# Patient Record
Sex: Female | Born: 1950 | Race: White | Hispanic: No | Smoking: Never smoker
Health system: Southern US, Community
[De-identification: ages and names within clinical notes are randomized; demographics above are authoritative.]

## PROBLEM LIST (undated history)

## (undated) DIAGNOSIS — T7840XA Allergy, unspecified, initial encounter: Secondary | ICD-10-CM

## (undated) DIAGNOSIS — M8580 Other specified disorders of bone density and structure, unspecified site: Secondary | ICD-10-CM

## (undated) DIAGNOSIS — K219 Gastro-esophageal reflux disease without esophagitis: Secondary | ICD-10-CM

## (undated) DIAGNOSIS — C801 Malignant (primary) neoplasm, unspecified: Secondary | ICD-10-CM

## (undated) HISTORY — PX: FRACTURE SURGERY: SHX138

## (undated) HISTORY — DX: Allergy, unspecified, initial encounter: T78.40XA

## (undated) HISTORY — DX: Malignant (primary) neoplasm, unspecified: C80.1

## (undated) HISTORY — PX: ABDOMINAL HYSTERECTOMY: SHX81

## (undated) HISTORY — DX: Other specified disorders of bone density and structure, unspecified site: M85.80

---

## 1999-05-31 ENCOUNTER — Emergency Department (HOSPITAL_COMMUNITY): Admission: EM | Admit: 1999-05-31 | Discharge: 1999-05-31 | Payer: Self-pay | Admitting: Emergency Medicine

## 2000-09-12 ENCOUNTER — Other Ambulatory Visit: Admission: RE | Admit: 2000-09-12 | Discharge: 2000-09-12 | Payer: Self-pay

## 2001-01-16 ENCOUNTER — Other Ambulatory Visit: Admission: RE | Admit: 2001-01-16 | Discharge: 2001-01-16 | Payer: Self-pay

## 2001-03-13 ENCOUNTER — Other Ambulatory Visit: Admission: RE | Admit: 2001-03-13 | Discharge: 2001-03-13 | Payer: Self-pay

## 2001-03-14 ENCOUNTER — Ambulatory Visit (HOSPITAL_COMMUNITY): Admission: RE | Admit: 2001-03-14 | Discharge: 2001-03-14 | Payer: Self-pay | Admitting: Obstetrics and Gynecology

## 2001-03-14 ENCOUNTER — Encounter: Payer: Self-pay | Admitting: Obstetrics and Gynecology

## 2001-03-28 ENCOUNTER — Ambulatory Visit (HOSPITAL_COMMUNITY): Admission: RE | Admit: 2001-03-28 | Discharge: 2001-03-28 | Payer: Self-pay | Admitting: Family Medicine

## 2001-03-28 ENCOUNTER — Encounter: Payer: Self-pay | Admitting: Obstetrics and Gynecology

## 2001-04-02 ENCOUNTER — Ambulatory Visit (HOSPITAL_COMMUNITY): Admission: RE | Admit: 2001-04-02 | Discharge: 2001-04-02 | Payer: Self-pay | Admitting: Obstetrics and Gynecology

## 2001-04-02 ENCOUNTER — Encounter: Payer: Self-pay | Admitting: Obstetrics and Gynecology

## 2002-04-15 ENCOUNTER — Ambulatory Visit (HOSPITAL_COMMUNITY): Admission: RE | Admit: 2002-04-15 | Discharge: 2002-04-15 | Payer: Self-pay | Admitting: Family Medicine

## 2002-04-15 ENCOUNTER — Encounter: Payer: Self-pay | Admitting: Family Medicine

## 2003-03-17 ENCOUNTER — Emergency Department (HOSPITAL_COMMUNITY): Admission: EM | Admit: 2003-03-17 | Discharge: 2003-03-17 | Payer: Self-pay | Admitting: Emergency Medicine

## 2003-04-22 ENCOUNTER — Encounter (HOSPITAL_COMMUNITY): Admission: RE | Admit: 2003-04-22 | Discharge: 2003-05-22 | Payer: Self-pay | Admitting: Orthopaedic Surgery

## 2003-05-26 ENCOUNTER — Emergency Department (HOSPITAL_COMMUNITY): Admission: EM | Admit: 2003-05-26 | Discharge: 2003-05-26 | Payer: Self-pay | Admitting: Emergency Medicine

## 2003-05-27 ENCOUNTER — Emergency Department (HOSPITAL_COMMUNITY): Admission: EM | Admit: 2003-05-27 | Discharge: 2003-05-27 | Payer: Self-pay | Admitting: Emergency Medicine

## 2003-06-07 HISTORY — PX: CHOLECYSTECTOMY: SHX55

## 2003-10-21 ENCOUNTER — Emergency Department (HOSPITAL_COMMUNITY): Admission: EM | Admit: 2003-10-21 | Discharge: 2003-10-21 | Payer: Self-pay | Admitting: Emergency Medicine

## 2004-04-18 ENCOUNTER — Emergency Department (HOSPITAL_COMMUNITY): Admission: EM | Admit: 2004-04-18 | Discharge: 2004-04-18 | Payer: Self-pay | Admitting: Emergency Medicine

## 2005-08-25 ENCOUNTER — Other Ambulatory Visit: Admission: RE | Admit: 2005-08-25 | Discharge: 2005-08-25 | Payer: Self-pay | Admitting: Emergency Medicine

## 2005-08-30 ENCOUNTER — Ambulatory Visit (HOSPITAL_COMMUNITY): Admission: RE | Admit: 2005-08-30 | Discharge: 2005-08-30 | Payer: Self-pay | Admitting: Emergency Medicine

## 2009-08-31 ENCOUNTER — Ambulatory Visit: Payer: Self-pay | Admitting: Gastroenterology

## 2009-08-31 DIAGNOSIS — K219 Gastro-esophageal reflux disease without esophagitis: Secondary | ICD-10-CM

## 2009-08-31 DIAGNOSIS — R1310 Dysphagia, unspecified: Secondary | ICD-10-CM | POA: Insufficient documentation

## 2009-09-04 ENCOUNTER — Ambulatory Visit (HOSPITAL_COMMUNITY): Admission: RE | Admit: 2009-09-04 | Discharge: 2009-09-04 | Payer: Self-pay | Admitting: Family Medicine

## 2009-09-04 HISTORY — PX: COLONOSCOPY WITH ESOPHAGOGASTRODUODENOSCOPY (EGD): SHX5779

## 2009-09-18 ENCOUNTER — Ambulatory Visit (HOSPITAL_COMMUNITY): Admission: RE | Admit: 2009-09-18 | Discharge: 2009-09-18 | Payer: Self-pay | Admitting: Gastroenterology

## 2009-09-18 ENCOUNTER — Ambulatory Visit: Payer: Self-pay | Admitting: Gastroenterology

## 2009-10-28 ENCOUNTER — Ambulatory Visit: Payer: Self-pay | Admitting: Vascular Surgery

## 2010-03-01 ENCOUNTER — Emergency Department (HOSPITAL_COMMUNITY)
Admission: EM | Admit: 2010-03-01 | Discharge: 2010-03-01 | Payer: Self-pay | Source: Home / Self Care | Admitting: Emergency Medicine

## 2010-03-21 ENCOUNTER — Emergency Department (HOSPITAL_COMMUNITY)
Admission: EM | Admit: 2010-03-21 | Discharge: 2010-03-22 | Payer: Self-pay | Source: Home / Self Care | Admitting: Emergency Medicine

## 2010-05-28 ENCOUNTER — Emergency Department (HOSPITAL_COMMUNITY)
Admission: EM | Admit: 2010-05-28 | Discharge: 2010-05-28 | Payer: Self-pay | Source: Home / Self Care | Admitting: Emergency Medicine

## 2010-07-06 NOTE — Assessment & Plan Note (Signed)
Summary: npp,diff swallowing.gu   Visit Type:  Initial Consult Referring Provider:  McGough Primary Care Provider:  McGough  Chief Complaint:  difficulty swallowing.  History of Present Illness: 60 y/o caucasian female w/ hx dysphagia x 5 yrs.  c/o "food getting hung, won't go down, stuck in upper esophagus."  Had to call EMS x2.  c/o problems w/ solids/meats.  Denies problems w/ liquids.  c/o heartburn & indigestion almost daily x 5 yrs.  Takes zantac prn & prevacid as needed.  Denies odynophagia.  Denies lower abd pain, rectal bleeding or melena.     Current Problems (verified): 1)  Gerd  (ICD-530.81) 2)  Dysphagia Unspecified  (ICD-787.20)  Current Medications (verified): 1)  Zantac 150 Mg Tabs (Ranitidine Hcl) .... As Needed 2)  Ibuprofen .... As Needed 3)  Omeprazole 20 Mg Cpdr (Omeprazole) .... One By Mouth Every Morning 30 Mins Before Breakfast For Acid Reflux  Allergies (verified): 1)  ! Codeine  Past History:  Past Medical History: GERD (ICD-530.81) DYSPHAGIA UNSPECIFIED (ICD-787.20)  Past Surgical History: complete hysterectomy cholecystectomy-cholelithisis  Family History: No known family history of colorectal carcinoma, IBD, liver or chronic GI problems. 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  Alcohol Use - no Daily Caffeine Use Illicit Drug Use - no Patient does not get regular exercise.  Smoking Status:  never Drug Use:  no Does Patient Exercise:  no  Review of Systems General:  Denies fever, chills, sweats, anorexia, fatigue, weakness, malaise, weight loss, and sleep disorder. ENT:  [place]. CV:  Denies chest pains, angina, palpitations, syncope, dyspnea on exertion, orthopnea, PND, peripheral edema, and claudication. Resp:  Denies dyspnea at rest, dyspnea  with exercise, cough, sputum, wheezing, coughing up blood, and pleurisy. GI:  Denies nausea, vomiting, vomiting blood, abdominal pain, jaundice, gas/bloating, diarrhea, constipation, change in bowel habits, bloody BM's, black BMs, and fecal incontinence. GU:  Denies urinary burning, blood in urine, nocturnal urination, urinary frequency, urinary incontinence, painful intercourse, and decreased libido. Derm:  Denies rash, itching, dry skin, hives, moles, warts, and unhealing ulcers. Psych:  Denies depression, anxiety, memory loss, suicidal ideation, hallucinations, paranoia, phobia, and confusion. Heme:  Denies bruising, bleeding, and enlarged lymph nodes.  Vital Signs:  Patient profile:   60 year old female Height:      61 inches Weight:      160 pounds BMI:     30.34 Temp:     97.6 degrees F oral Pulse rate:   72 / minute BP sitting:   138 / 80  (left arm) Cuff size:   regular  Vitals Entered By: Waldon Merl LPN (August 31, 172 94:49 AM)  Physical Exam  General:  Well developed, well nourished, no acute distress. Head:  Normocephalic and atraumatic. Eyes:  Sclera clear, no icterus. Ears:  Normal auditory acuity. Nose:  No deformity, discharge,  or lesions. Mouth:  No deformity or lesions, dentition normal. Neck:  Supple; no masses or thyromegaly. Lungs:  Clear throughout to auscultation. Heart:  Regular rate and rhythm; no murmurs, rubs,  or bruits. Abdomen:  Soft, nontender and nondistended. No masses, hepatosplenomegaly or hernias noted. Normal bowel sounds.without guarding and without rebound.   Msk:  Symmetrical with no gross deformities. Normal posture. Pulses:  Normal pulses noted. Extremities:  No clubbing, cyanosis, edema or deformities noted. Neurologic:  Alert and  oriented x4;  grossly normal neurologically. Skin:  Intact without significant lesions or rashes. Cervical Nodes:  No significant cervical adenopathy. Psych:  Alert and cooperative. Normal mood and  affect.  Impression & Recommendations:  Problem # 1:  DYSPHAGIA UNSPECIFIED (ICD-86.20) 60 y/o caucasian female w/ long-standing history of dysphagia w/ solids and chronic poorly controlled GERD, not on consistent PPI.  I suspect esophageal web, ring or stricture.  Diagnostic EGD w/ possible esophageal dilatation & screening colonoscopy to be performed by Dr. Barney Drain in the near future.  I have discussed risks and benefits which include, but are not limited to, bleeding, infection, perforation, or medication reaction.  The patient agrees with this plan and consent will be obtained.  Orders: Consultation Level III (57473)  Problem # 2:  GERD (ICD-530.81) See #1  Patient Instructions: 1)  Discontinue prevacid 2)  Begin omeprazole daily 3)  Can use Zantac as needed for breakthrough symptoms 4)  The medication list was reviewed and reconciled.  All changed / newly prescribed medications were explained.  A complete medication list was provided to the patient / caregiver. Prescriptions: OMEPRAZOLE 20 MG CPDR (OMEPRAZOLE) one by mouth every morning 30 mins before breakfast for acid reflux  #31 x 11   Entered and Authorized by:   Andria Meuse FNP-BC   Signed by:   Andria Meuse FNP-BC on 08/31/2009   Method used:   Electronically to        [facility] (retail)       [address]       [place], [place]  [address]       Ph: [phone]       Fax: [phone]   RxID:   [id number]      Appended Document: npp,diff swallowing.gu Please call pt. She needs a soft mechanical diet with chopped or ground meats only until her endo. Give her a HO.  Appended Document: npp,diff swallowing.gu Pt aware.  Please send HO in mail to pt.   Appended Document: npp,diff swallowing.gu HO in mail to pt  Appended Document: npp,diff swallowing.gu DEC 2010: 155 LBS

## 2010-07-06 NOTE — Letter (Signed)
Summary: Internal Other /Procedure scheduling  Internal Other /Procedure scheduling   Imported By: Waldon Merl LPN 38/45/[id number] 80:32:12  _____________________________________________________________________  External Attachment:    Type:   Image     Comment:   External Document

## 2010-08-16 LAB — RAPID STREP SCREEN (MED CTR MEBANE ONLY): Streptococcus, Group A Screen (Direct): NEGATIVE

## 2010-08-18 LAB — CBC
HCT: 33.3 % — ABNORMAL LOW (ref 36.0–46.0)
Hemoglobin: 11.5 g/dL — ABNORMAL LOW (ref 12.0–15.0)
MCH: 30.9 pg (ref 26.0–34.0)
MCHC: 34.5 g/dL (ref 30.0–36.0)
MCV: 89.5 fL (ref 78.0–100.0)
Platelets: 213 10*3/uL (ref 150–400)
RBC: 3.72 MIL/uL — ABNORMAL LOW (ref 3.87–5.11)
RDW: 12.9 % (ref 11.5–15.5)
WBC: 10.7 10*3/uL — ABNORMAL HIGH (ref 4.0–10.5)

## 2010-08-18 LAB — BASIC METABOLIC PANEL
BUN: 14 mg/dL (ref 6–23)
CO2: 25 mEq/L (ref 19–32)
Calcium: 8.4 mg/dL (ref 8.4–10.5)
Chloride: 102 mEq/L (ref 96–112)
Creatinine, Ser: 0.74 mg/dL (ref 0.4–1.2)
GFR calc Af Amer: >60 mL/min (ref >60)
GFR calc non Af Amer: >60 mL/min (ref >60)
Glucose, Bld: 115 mg/dL — ABNORMAL HIGH (ref 70–99)
Potassium: 3.4 mEq/L — ABNORMAL LOW (ref 3.5–5.1)
Sodium: 132 mEq/L — ABNORMAL LOW (ref 135–145)

## 2010-08-18 LAB — DIFFERENTIAL
Basophils Absolute: 0 10*3/uL (ref 0.0–0.1)
Basophils Relative: 0 % (ref 0–1)
Eosinophils Absolute: 0.1 10*3/uL (ref 0.0–0.7)
Eosinophils Relative: 1 % (ref 0–5)
Lymphocytes Relative: 12 % (ref 12–46)
Lymphs Abs: 1.3 10*3/uL (ref 0.7–4.0)
Monocytes Absolute: 0.5 10*3/uL (ref 0.1–1.0)
Monocytes Relative: 5 % (ref 3–12)
Neutro Abs: 8.7 10*3/uL — ABNORMAL HIGH (ref 1.7–7.7)
Neutrophils Relative %: 81 % — ABNORMAL HIGH (ref 43–77)

## 2010-08-19 LAB — URINALYSIS, ROUTINE W REFLEX MICROSCOPIC
Bilirubin Urine: NEGATIVE
Glucose, UA: NEGATIVE mg/dL
Ketones, ur: NEGATIVE mg/dL
Nitrite: POSITIVE — AB
Protein, ur: NEGATIVE mg/dL
Specific Gravity, Urine: 1.02 (ref 1.005–1.030)
Urobilinogen, UA: 0.2 mg/dL (ref 0.0–1.0)
pH: 6 (ref 5.0–8.0)

## 2010-08-19 LAB — COMPREHENSIVE METABOLIC PANEL
ALT: 15 U/L (ref 0–35)
AST: 17 U/L (ref 0–37)
Albumin: 3.3 g/dL — ABNORMAL LOW (ref 3.5–5.2)
Alkaline Phosphatase: 72 U/L (ref 39–117)
BUN: 16 mg/dL (ref 6–23)
CO2: 27 mEq/L (ref 19–32)
Calcium: 8.8 mg/dL (ref 8.4–10.5)
Chloride: 106 mEq/L (ref 96–112)
Creatinine, Ser: 0.92 mg/dL (ref 0.4–1.2)
GFR calc Af Amer: >60 mL/min (ref >60)
GFR calc non Af Amer: >60 mL/min (ref >60)
Glucose, Bld: 95 mg/dL (ref 70–99)
Potassium: 3.3 mEq/L — ABNORMAL LOW (ref 3.5–5.1)
Sodium: 140 mEq/L (ref 135–145)
Total Bilirubin: 0.5 mg/dL (ref 0.3–1.2)
Total Protein: 6.8 g/dL (ref 6.0–8.3)

## 2010-08-19 LAB — URINE MICROSCOPIC-ADD ON

## 2010-08-19 LAB — URINE CULTURE
Colony Count: >=100000
Culture  Setup Time: 201109270210

## 2010-08-19 LAB — CBC
HCT: 33.7 % — ABNORMAL LOW (ref 36.0–46.0)
Hemoglobin: 11.5 g/dL — ABNORMAL LOW (ref 12.0–15.0)
MCH: 30.7 pg (ref 26.0–34.0)
MCHC: 34.1 g/dL (ref 30.0–36.0)
MCV: 90.2 fL (ref 78.0–100.0)
Platelets: 172 10*3/uL (ref 150–400)
RBC: 3.73 MIL/uL — ABNORMAL LOW (ref 3.87–5.11)
RDW: 12.9 % (ref 11.5–15.5)
WBC: 6.1 10*3/uL (ref 4.0–10.5)

## 2010-08-19 LAB — DIFFERENTIAL
Basophils Absolute: 0 10*3/uL (ref 0.0–0.1)
Basophils Relative: 0 % (ref 0–1)
Eosinophils Absolute: 0.1 10*3/uL (ref 0.0–0.7)
Eosinophils Relative: 1 % (ref 0–5)
Lymphocytes Relative: 22 % (ref 12–46)
Lymphs Abs: 1.4 10*3/uL (ref 0.7–4.0)
Monocytes Absolute: 0.4 10*3/uL (ref 0.1–1.0)
Monocytes Relative: 7 % (ref 3–12)
Neutro Abs: 4.3 10*3/uL (ref 1.7–7.7)
Neutrophils Relative %: 70 % (ref 43–77)

## 2010-08-19 LAB — LIPASE, BLOOD: Lipase: 28 U/L (ref 11–59)

## 2010-08-31 ENCOUNTER — Emergency Department (HOSPITAL_COMMUNITY)
Admission: EM | Admit: 2010-08-31 | Discharge: 2010-08-31 | Disposition: A | Discharge status: Home / Self Care | Payer: PRIVATE HEALTH INSURANCE | Attending: Emergency Medicine | Admitting: Emergency Medicine

## 2010-08-31 DIAGNOSIS — M542 Cervicalgia: Secondary | ICD-10-CM | POA: Insufficient documentation

## 2010-08-31 DIAGNOSIS — N390 Urinary tract infection, site not specified: Secondary | ICD-10-CM | POA: Insufficient documentation

## 2010-08-31 DIAGNOSIS — M62838 Other muscle spasm: Secondary | ICD-10-CM | POA: Insufficient documentation

## 2010-08-31 DIAGNOSIS — R300 Dysuria: Secondary | ICD-10-CM | POA: Insufficient documentation

## 2010-08-31 LAB — URINALYSIS, ROUTINE W REFLEX MICROSCOPIC
Bilirubin Urine: NEGATIVE
Glucose, UA: NEGATIVE mg/dL
Hgb urine dipstick: NEGATIVE
Nitrite: NEGATIVE
Protein, ur: NEGATIVE mg/dL
Specific Gravity, Urine: 1.025 (ref 1.005–1.030)
Urobilinogen, UA: 0.2 mg/dL (ref 0.0–1.0)
pH: 5.5 (ref 5.0–8.0)

## 2010-08-31 LAB — URINE MICROSCOPIC-ADD ON

## 2010-09-02 LAB — URINE CULTURE
Colony Count: >=100000
Culture  Setup Time: 201203271915

## 2010-10-19 NOTE — Consult Note (Signed)
NEW PATIENT CONSULTATION   Rebecca Osborne, Rebecca Osborne  DOB:  [birth year]                                       10/28/2009  XIHWT#:[id number]   The patient presents today for evaluation a marked telangiectasia over  both legs.  She reports that she had discomfort from this and has had  bleeding the telangectasia on her thigh on several occasions.  This was  controlled with pressure and she has never had to have any surgical  treatment of this.  She denies any history of DVT.  She does report  generalized aching in both lower extremities with prolonged standing.  This is equal to the right and left legs.  This is somewhat more  prominent over the area of the telangiectasia.  She does not have any  history of thrombophlebitis or ulceration.   PAST MEDICAL HISTORY:  Significant for prior history of esophageal  reflux.  She does have a TAH/BSO secondary to cervical cancer in the  past.  Her medical history is otherwise negative for diabetes,  hypertension or cardiac disease.   FAMILY HISTORY:  Negative for premature atherosclerotic disease or  venous varicosities.   SOCIAL HISTORY:  She is single.  She has 3 children.  She does not smoke  or drink alcohol.   REVIEW OF SYSTEMS:  CONSTITUTIONAL:  No weight loss or weight gain.  CARDIAC:  Negative.  PULMONARY:  Negative.  GI:  Negative.  GU:  Negative.  VASCULAR:  Positive only for pain in her legs with walking and lying  flat.  NEUROLOGIC:  Negative.  MUSCULOSKELETAL:  Positive for pain in her legs with prolonged standing.  PSYCHIATRIC:  Negative.  ENT:  Positive for eyesight change.  HEMATOLOGIC:  Negative.  SKIN:  Negative.   PHYSICAL EXAMINATION:  Well-developed, well-nourished white female  appearing stated age of 33.  Blood pressure is 125/81, pulse 65,  respirations 16.  In general, she is well-developed, well-nourished, in  no acute distress.  HEENT is normal.  Her abdomen is soft and nontender.  No masses.   Musculoskeletal:  No major deformities or cyanosis.  Neurologic:  No focal weakness or paresthesias.  Skin:  No ulcers or  rashes.  She does have marked telangiectasia over both legs.  These are  raised above the skin in a fan-like fashion over her thighs most  particularly and also over her calves.  She does not have any actual  varicose veins and no changes of chronic venous hypertension.   She underwent noninvasive vascular laboratory studies in our office, and  this revealed no evidence of deep venous thrombosis or deep venous  reflux.  Her saphenous vein is without reflux bilaterally as well.  I  reviewed this with the patient.  I explained that her only treatment  option would be sclerotherapy for these telangiectasia.  I did discuss  with her the treatment for bleeding should she have any more bleeding  from the superficial telangiectasia, which would consist of moderate  pressure and Ace an wrap and that this would generally effect stoppage  of the bleeding.  I did explain the treatment option for sclerotherapy  for cosmetic purposes, and also it may improve her symptoms to some  degree.  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  I explained that this  would require multiple sessions.  I explained that this would not  be  covered by insurance and our approximate estimate for treatment.  She  will consider this and notify us if she wishes to proceed.     Rosetta Posner, M.D.  Electronically Signed   TFE/MEDQ  D:  10/28/2009  T:  10/29/2009  Job:  [id number]   cc:   Leonides Grills, M.D.

## 2010-10-19 NOTE — Procedures (Signed)
LOWER EXTREMITY VENOUS REFLUX EXAM   INDICATION:  Raised spider veins.   EXAM:  Using color-flow imaging and pulse Doppler spectral analysis, the  bilateral common femoral, superficial femoral, popliteal, posterior  tibial, greater and lesser saphenous veins are evaluated.  There is no  evidence suggesting deep venous insufficiency in the bilateral lower  extremities.   The bilateral saphenofemoral junctions are competent. The bilateral  GSV's are competent.   The bilateral proximal short saphenous veins demonstrate competency.   GSV Diameter (used if found to be incompetent only)                                            Right    Left  Proximal Greater Saphenous Vein           cm       cm  Proximal-to-mid-thigh                     cm       cm  Mid thigh                                 cm       cm  Mid-distal thigh                          cm       cm  Distal thigh                              cm       cm  Knee                                      cm       cm    IMPRESSION:  1. The bilateral greater saphenous vein appears with no reflux.  2. The bilateral greater saphenous veins are not aneurysmal.  3. The bilateral greater saphenous veins are not tortuous.  4. The deep venous system is competent.  5. The bilateral lesser saphenous veins are competent.           ___________________________________________  Rosetta Posner, M.D.   CB/MEDQ  D:  10/28/2009  T:  10/28/2009  Job:  [id number]

## 2010-10-22 NOTE — Consult Note (Signed)
   Rebecca Osborne, Rebecca Osborne                           ACCOUNT NO.:  [id number]   MEDICAL RECORD NO.:  [id number]                   PATIENT TYPE:  EMS   LOCATION:  ED                                   FACILITY:  [facility]   PHYSICIAN:  J. Sanjuana Kava, M.D.              DATE OF BIRTH:  18-Feb-1951   DATE OF CONSULTATION:  03/17/2003  DATE OF DISCHARGE:                                   CONSULTATION   REFERRING PHYSICIAN:  Dr. Jessy Oto. Lown[place].   REASON FOR CONSULTATION:  Dr. Ivy Lynn asked [place] to see the patient in the  emergency room.   HISTORY OF PRESENT ILLNESS:  The patient is a 60 year old female who was  putting her antenna back on her phone when it slipped and she stabbed  herself, lacerating the extensor tendon to the left long finger, nondominant  hand.  She has inability to extend the finger.  She has a flap-type  laceration.  Dr. Ivy Lynn had opened and explored the wound before I came.   DESCRIPTION OF PROCEDURE:  I explained to the patient the findings and I  used Betadine prep and drape; Xylocaine 1% was used and the ends of the  tendon were then exposed after extending of the incision both proximally and  distally.  The wounds were then reapproximated using 3-0 nylon in figure-of-  eight manner.  Skin wounds were then reapproximated using simple suture and  vertical mattress suture and then a sterile dressing applied, and aluminum  splint applied to keep the long finger straight.   FOLLOWUP:  I will see the patient in the office on Tuesday.  It is already 3  a.m., Monday morning on the 11th.  Prescription for Darvocet-N 100 given for  pain; the patient is allergic to codeine.  Prescription for Keflex 500 mg  given.   IMPRESSION:  Laceration of extensor tendon, left long finger.   PLAN:  The patient is to come back to the emergency room with any problem or  call me through the office during regular hours, call to the hospital after  office hours.  Patient information booklet  given too.      ___________________________________________                                            Iona Hansen, M.D.   JWK/MEDQ  D:  03/17/2003  T:  03/17/2003  Job:  [id number]

## 2012-05-10 ENCOUNTER — Encounter (HOSPITAL_COMMUNITY): Payer: Self-pay | Admitting: Emergency Medicine

## 2012-05-10 ENCOUNTER — Emergency Department (HOSPITAL_COMMUNITY)
Admission: EM | Admit: 2012-05-10 | Discharge: 2012-05-10 | Disposition: A | Discharge status: Home / Self Care | Payer: No Typology Code available for payment source | Attending: Emergency Medicine | Admitting: Emergency Medicine

## 2012-05-10 DIAGNOSIS — N390 Urinary tract infection, site not specified: Secondary | ICD-10-CM | POA: Insufficient documentation

## 2012-05-10 DIAGNOSIS — R51 Headache: Secondary | ICD-10-CM | POA: Insufficient documentation

## 2012-05-10 LAB — URINE MICROSCOPIC-ADD ON

## 2012-05-10 LAB — URINALYSIS, ROUTINE W REFLEX MICROSCOPIC
Bilirubin Urine: NEGATIVE
Glucose, UA: NEGATIVE mg/dL
Ketones, ur: NEGATIVE mg/dL
Nitrite: NEGATIVE
Protein, ur: NEGATIVE mg/dL
Specific Gravity, Urine: >1.03 — ABNORMAL HIGH (ref 1.005–1.030)
Urobilinogen, UA: 0.2 mg/dL (ref 0.0–1.0)
pH: 5.5 (ref 5.0–8.0)

## 2012-05-10 MED ORDER — CIPROFLOXACIN HCL 500 MG PO TABS: 500.0000 mg | ORAL_TABLET | Freq: Two times a day (BID) | ORAL | Status: DC

## 2012-05-10 NOTE — ED Provider Notes (Signed)
History  This chart was scribed for Mervin Kung, MD by Era Bumpers, ED scribe. This patient was seen in room APA03/APA03 and the patient's care was started at 1615.   CSN: [id number]  Arrival date & time 05/10/12  1615   First MD Initiated Contact with Patient 05/10/12 1740      Chief Complaint  Patient presents with  . Dysuria   Patient is a 61 y.o. female presenting with dysuria. The history is provided by the patient. No language interpreter was used.  Dysuria  This is a new problem. The current episode started more than 1 week ago. The problem occurs every urination. The problem has been gradually worsening. The quality of the pain is described as burning. The pain is moderate. There has been no fever. Pertinent negatives include no chills, no nausea, no vomiting and no hematuria. She has tried nothing for the symptoms.   Rebecca Osborne is a 61 y.o. female who presents to the Emergency Department complaining of constant dysuria over the past month which worsened about 3 days ago associated with strong urine odor. She states a mild HA and abdominal pain 2 days ago which has since resolved. She has not taken any medicines for this problem and has not yet seen anyone for this problem. She denies abdominal pain, nausea, emesis, back/neck pain, CP, SOB, leg/ankle swelling, rash.  She also states dental carries and needing to have a tooth pulled soon.   She has no PCP  History reviewed. No pertinent past medical history.  Past Surgical History  Procedure Date  . Abdominal hysterectomy   . Cholecystectomy     History reviewed. No pertinent family history.  History  Substance Use Topics  . Smoking status: Not on file  . Smokeless tobacco: Not on file  . Alcohol Use:     OB History    Grav Para Term Preterm Abortions TAB SAB Ect Mult Living                  Review of Systems  Constitutional: Negative for fever and chills.  HENT: Negative for congestion.   Respiratory:  Negative for cough and shortness of breath.   Cardiovascular: Negative for chest pain.  Gastrointestinal: Negative for nausea, vomiting and abdominal pain.  Genitourinary: Positive for dysuria. Negative for hematuria.  Musculoskeletal: Negative for back pain.  Neurological: Negative for weakness.  All other systems reviewed and are negative.    Allergies  Codeine  Home Medications   Current Outpatient Rx  Name  Route  Sig  Dispense  Refill  . CIPROFLOXACIN HCL 500 MG PO TABS   Oral   Take 1 tablet (500 mg total) by mouth 2 (two) times daily.   14 tablet   0     Triage Vitals: BP 92/72  Pulse 66  Temp 97.7 F (36.5 C) (Oral)  Resp 18  SpO2 98%  Physical Exam  Nursing note and vitals reviewed. Constitutional: She is oriented to person, place, and time. She appears well-developed and well-nourished. No distress.  HENT:  Head: Normocephalic and atraumatic.       Recession of gumline, no distinct gengivitis  Eyes: EOM are normal.  Neck: Neck supple. No tracheal deviation present.  Cardiovascular: Normal rate, regular rhythm and normal heart sounds.   No murmur heard. Pulmonary/Chest: Effort normal and breath sounds normal. No respiratory distress. She has no wheezes. She has no rales.  Abdominal: Soft. Bowel sounds are normal. She exhibits no distension. There  is no tenderness. There is no rebound and no guarding.  Musculoskeletal: Normal range of motion.       No pitting edema  Neurological: She is alert and oriented to person, place, and time. No cranial nerve deficit.  Skin: Skin is warm and dry.  Psychiatric: She has a normal mood and affect. Her behavior is normal.    ED Course  Procedures (including critical care time) DIAGNOSTIC STUDIES: Oxygen Saturation is 98% on room air, normal by my interpretation.    COORDINATION OF CARE: At 615 PM Discussed treatment plan with patient which includes UA. Patient agrees.   Labs Reviewed  URINALYSIS, ROUTINE W REFLEX  MICROSCOPIC - Abnormal; Notable for the following:    Specific Gravity, Urine >1.030 (*)     Hgb urine dipstick TRACE (*)     Leukocytes, UA SMALL (*)     All other components within normal limits  URINE MICROSCOPIC-ADD ON - Abnormal; Notable for the following:    Bacteria, UA MANY (*)     All other components within normal limits  URINE CULTURE   No results found. Results for orders placed during the hospital encounter of 05/10/12  URINALYSIS, ROUTINE W REFLEX MICROSCOPIC      Component Value Range   Color, Urine YELLOW  YELLOW   APPearance CLEAR  CLEAR   Specific Gravity, Urine >1.030 (*) 1.005 - 1.030   pH 5.5  5.0 - 8.0   Glucose, UA NEGATIVE  NEGATIVE mg/dL   Hgb urine dipstick TRACE (*) NEGATIVE   Bilirubin Urine NEGATIVE  NEGATIVE   Ketones, ur NEGATIVE  NEGATIVE mg/dL   Protein, ur NEGATIVE  NEGATIVE mg/dL   Urobilinogen, UA 0.2  0.0 - 1.0 mg/dL   Nitrite NEGATIVE  NEGATIVE   Leukocytes, UA SMALL (*) NEGATIVE  URINE MICROSCOPIC-ADD ON      Component Value Range   Squamous Epithelial / LPF RARE  RARE   WBC, UA 21-50  <3 WBC/hpf   RBC / HPF 0-2  <3 RBC/hpf   Bacteria, UA MANY (*) RARE   Urine-Other MUCOUS PRESENT       1. UTI (lower urinary tract infection)       MDM  Urinalysis and symptoms are consistent with urinary tract infection. Urine culture is pending to confirm sensitivities in the type of bacteria. Will treat with Cipro orally at home for the next 7 days. Patient is nontoxic no acute distress no evidence of pyelonephritis.    I personally performed the services described in this documentation, which was scribed in my presence. The recorded information has been reviewed and is accurate.            Mervin Kung, MD 05/10/12 [id number]

## 2012-05-10 NOTE — ED Notes (Signed)
Pt c/o dysuria x1 month. Pt states symptoms have gradually worsened. Pt denies hematuria.

## 2012-05-10 NOTE — ED Notes (Signed)
MD at bedside. 

## 2012-05-12 LAB — URINE CULTURE: Colony Count: 80000

## 2012-05-13 NOTE — ED Notes (Signed)
+  Urine. Patient treated with Cipro. Sensitive to same. Per protocol MD.

## 2012-06-01 ENCOUNTER — Encounter (HOSPITAL_COMMUNITY): Payer: Self-pay | Admitting: *Deleted

## 2012-06-01 ENCOUNTER — Emergency Department (HOSPITAL_COMMUNITY)
Admission: EM | Admit: 2012-06-01 | Discharge: 2012-06-01 | Disposition: A | Discharge status: Home / Self Care | Payer: No Typology Code available for payment source | Attending: Emergency Medicine | Admitting: Emergency Medicine

## 2012-06-01 DIAGNOSIS — R5381 Other malaise: Secondary | ICD-10-CM | POA: Insufficient documentation

## 2012-06-01 DIAGNOSIS — R51 Headache: Secondary | ICD-10-CM | POA: Insufficient documentation

## 2012-06-01 DIAGNOSIS — R079 Chest pain, unspecified: Secondary | ICD-10-CM | POA: Insufficient documentation

## 2012-06-01 DIAGNOSIS — R05 Cough: Secondary | ICD-10-CM | POA: Insufficient documentation

## 2012-06-01 DIAGNOSIS — Z90710 Acquired absence of both cervix and uterus: Secondary | ICD-10-CM | POA: Insufficient documentation

## 2012-06-01 DIAGNOSIS — J1189 Influenza due to unidentified influenza virus with other manifestations: Secondary | ICD-10-CM | POA: Insufficient documentation

## 2012-06-01 DIAGNOSIS — B9789 Other viral agents as the cause of diseases classified elsewhere: Secondary | ICD-10-CM | POA: Insufficient documentation

## 2012-06-01 DIAGNOSIS — R059 Cough, unspecified: Secondary | ICD-10-CM | POA: Insufficient documentation

## 2012-06-01 DIAGNOSIS — Z7982 Long term (current) use of aspirin: Secondary | ICD-10-CM | POA: Insufficient documentation

## 2012-06-01 DIAGNOSIS — R630 Anorexia: Secondary | ICD-10-CM | POA: Insufficient documentation

## 2012-06-01 DIAGNOSIS — B349 Viral infection, unspecified: Secondary | ICD-10-CM

## 2012-06-01 MED ORDER — IBUPROFEN 800 MG PO TABS
800.0000 mg | ORAL_TABLET | Freq: Once | ORAL | Status: AC
  Administered 2012-06-01: 800 mg via ORAL
  Filled 2012-06-01: qty 1

## 2012-06-01 MED ORDER — ACETAMINOPHEN 500 MG PO TABS
1000.0000 mg | ORAL_TABLET | Freq: Once | ORAL | Status: AC
  Administered 2012-06-01: 1000 mg via ORAL
  Filled 2012-06-01: qty 2

## 2012-06-01 NOTE — ED Notes (Signed)
Pt with body aches, gen. Weakness, cough and fever since this morning after working 2 hours at her job today, sent home, states grandson has same symptoms

## 2012-06-01 NOTE — ED Notes (Signed)
Pt states flu like symptoms. Pt states cough, bodyaches, headaches, chills, generalized weakness. Symptoms began this morning. Denies N/V/D

## 2012-06-01 NOTE — ED Provider Notes (Signed)
History   This chart was scribed for Nat Christen, MD, by Joline Maxcy, ER scribe. The patient was seen in room APA11/APA11 and the patient's care was started at 1926.    CSN: [id number]  Arrival date & time 06/01/12  1525   First MD Initiated Contact with Patient 06/01/12 1926      Chief Complaint  Patient presents with  . Influenza    (Consider location/radiation/quality/duration/timing/severity/associated sxs/prior treatment) HPI Rebecca Osborne is a 61 y.o. female who presents to the Emergency Department complaining of a constant, moderate fever with associated coughing, chest pain, decreased appetite, generalized weakness, and headache that began this morning. She denies any nausea, emesis, or diarrhea. She denies anything that aggravates or improves the symptoms. Her daughter is sick and exhibiting similar symptoms as well. She denies any treatments at home.  History reviewed. No pertinent past medical history.  Past Surgical History  Procedure Date  . Abdominal hysterectomy   . Cholecystectomy     No family history on file.  History  Substance Use Topics  . Smoking status: Never Smoker   . Smokeless tobacco: Not on file  . Alcohol Use: No    OB History    Grav Para Term Preterm Abortions TAB SAB Ect Mult Living                  Review of Systems  Constitutional: Positive for fever, chills and appetite change.  Respiratory: Positive for cough.   Cardiovascular: Positive for chest pain.  Gastrointestinal: Negative for nausea, vomiting and diarrhea.  Neurological: Positive for weakness and headaches.  All other systems reviewed and are negative.    Allergies  Codeine  Home Medications   Current Outpatient Rx  Name  Route  Sig  Dispense  Refill  . ASPIRIN EC 81 MG PO TBEC   Oral   Take 81 mg by mouth every morning.         Marland Kitchen OMEPRAZOLE 20 MG PO CPDR   Oral   Take 20 mg by mouth every morning.           BP 147/102  Pulse 116  Temp 100 F  (37.8 C) (Oral)  Resp 20  Ht 5' 1"  (1.549 m)  Wt 152 lb (68.947 kg)  BMI 28.72 kg/m2  SpO2 100%  Physical Exam  Nursing note and vitals reviewed. Constitutional: She is oriented to person, place, and time. She appears well-developed and well-nourished.  HENT:  Head: Normocephalic and atraumatic.  Eyes: Conjunctivae normal and EOM are normal. Pupils are equal, round, and reactive to light.  Neck: Normal range of motion. Neck supple.  Cardiovascular: Normal rate, regular rhythm and normal heart sounds.   Pulmonary/Chest: Effort normal and breath sounds normal.  Abdominal: Soft. Bowel sounds are normal.  Musculoskeletal: Normal range of motion.  Neurological: She is alert and oriented to person, place, and time.  Skin: Skin is warm and dry.  Psychiatric: She has a normal mood and affect.    ED Course  Procedures (including critical care time)  DIAGNOSTIC STUDIES: Oxygen Saturation is 100% on room air, normal by my interpretation.    COORDINATION OF CARE:  19:51- Discussed planned course of treatment with the patient, including increasing fluid intake at home, who is agreeable at this time.   Labs Reviewed - No data to display No results found.   No diagnosis found.    MDM  History and physical consistent with viral syndrome. Patient is nontoxic. No testing necessary  I personally performed the services described in this documentation, which was scribed in my presence. The recorded information has been reviewed and is accurate.        Nat Christen, MD 06/01/12 2012

## 2012-09-01 ENCOUNTER — Emergency Department (HOSPITAL_COMMUNITY)
Admission: EM | Admit: 2012-09-01 | Discharge: 2012-09-01 | Disposition: A | Discharge status: Home / Self Care | Payer: No Typology Code available for payment source | Attending: Emergency Medicine | Admitting: Emergency Medicine

## 2012-09-01 ENCOUNTER — Encounter (HOSPITAL_COMMUNITY): Payer: Self-pay | Admitting: *Deleted

## 2012-09-01 DIAGNOSIS — J3489 Other specified disorders of nose and nasal sinuses: Secondary | ICD-10-CM | POA: Insufficient documentation

## 2012-09-01 DIAGNOSIS — R300 Dysuria: Secondary | ICD-10-CM | POA: Insufficient documentation

## 2012-09-01 DIAGNOSIS — Z79899 Other long term (current) drug therapy: Secondary | ICD-10-CM | POA: Insufficient documentation

## 2012-09-01 DIAGNOSIS — R3915 Urgency of urination: Secondary | ICD-10-CM | POA: Insufficient documentation

## 2012-09-01 DIAGNOSIS — R350 Frequency of micturition: Secondary | ICD-10-CM | POA: Insufficient documentation

## 2012-09-01 DIAGNOSIS — N390 Urinary tract infection, site not specified: Secondary | ICD-10-CM | POA: Insufficient documentation

## 2012-09-01 DIAGNOSIS — M549 Dorsalgia, unspecified: Secondary | ICD-10-CM | POA: Insufficient documentation

## 2012-09-01 DIAGNOSIS — Z7982 Long term (current) use of aspirin: Secondary | ICD-10-CM | POA: Insufficient documentation

## 2012-09-01 LAB — URINALYSIS, ROUTINE W REFLEX MICROSCOPIC
Bilirubin Urine: NEGATIVE
Glucose, UA: NEGATIVE mg/dL
Ketones, ur: NEGATIVE mg/dL
Nitrite: POSITIVE — AB
Protein, ur: NEGATIVE mg/dL
Specific Gravity, Urine: 1.025 (ref 1.005–1.030)
Urobilinogen, UA: 0.2 mg/dL (ref 0.0–1.0)
pH: 6 (ref 5.0–8.0)

## 2012-09-01 MED ORDER — PHENAZOPYRIDINE HCL 200 MG PO TABS: 200.0000 mg | ORAL_TABLET | Freq: Three times a day (TID) | ORAL | Status: DC

## 2012-09-01 MED ORDER — CIPROFLOXACIN HCL 500 MG PO TABS: 500.0000 mg | ORAL_TABLET | Freq: Two times a day (BID) | ORAL | Status: DC

## 2012-09-01 NOTE — ED Provider Notes (Signed)
History     CSN: [id number]  Arrival date & time 09/01/12  1339   First MD Initiated Contact with Patient 09/01/12 1718      Chief Complaint  Patient presents with  . Urinary Tract Infection    (Consider location/radiation/quality/duration/timing/severity/associated sxs/prior treatment) Patient is a 62 y.o. female presenting with urinary tract infection. The history is provided by the patient.  Urinary Tract Infection This is a new problem. Pertinent negatives include no abdominal pain, chest pain, chills, coughing, fever, headaches, nausea, neck pain, rash or vomiting.   The patient states that 3 weeks ago she started having frequent urination. Since then she has developed back pain and pain with urination. She has not taken anything for pain. The urine has a strong odor. She denies nausea, vomiting or other problems.  History reviewed. No pertinent past medical history.  Past Surgical History  Procedure Laterality Date  . Abdominal hysterectomy    . Cholecystectomy      No family history on file.  History  Substance Use Topics  . Smoking status: Never Smoker   . Smokeless tobacco: Not on file  . Alcohol Use: No    OB History   Grav Para Term Preterm Abortions TAB SAB Ect Mult Living                  Review of Systems  Constitutional: Negative for fever and chills.  HENT: Positive for sinus pressure. Negative for neck pain.   Respiratory: Negative for cough and wheezing.   Cardiovascular: Negative for chest pain and palpitations.  Gastrointestinal: Negative for nausea, vomiting and abdominal pain.  Genitourinary: Positive for dysuria, urgency and frequency.  Musculoskeletal: Positive for back pain.  Skin: Negative for rash.  Allergic/Immunologic: Negative for immunocompromised state.  Neurological: Negative for light-headedness and headaches.  Psychiatric/Behavioral: Negative for confusion.    Allergies  Codeine  Home Medications   Current Outpatient Rx   Name  Route  Sig  Dispense  Refill  . aspirin EC 81 MG tablet   Oral   Take 81 mg by mouth every morning.         Marland Kitchen omeprazole (PRILOSEC) 20 MG capsule   Oral   Take 20 mg by mouth every morning.           BP 128/75  Pulse 71  Temp(Src) 98 F (36.7 C) (Oral)  Resp 18  Ht 5' 1"  (1.549 m)  Wt 152 lb (68.947 kg)  BMI 28.74 kg/m2  SpO2 100%  Physical Exam  Nursing note and vitals reviewed. Constitutional: She is oriented to person, place, and time. She appears well-developed and well-nourished. No distress.  HENT:  Head: Normocephalic and atraumatic.  Eyes: EOM are normal. Pupils are equal, round, and reactive to light.  Neck: Normal range of motion. Neck supple.  Cardiovascular: Normal rate, regular rhythm and normal heart sounds.   Pulmonary/Chest: Effort normal and breath sounds normal. No respiratory distress. She has no wheezes.  Abdominal: Soft. Bowel sounds are normal. There is no tenderness. There is no CVA tenderness.  Musculoskeletal: Normal range of motion. She exhibits no edema.  Lower lumbar area with minimal pain with range of motion.  Neurological: She is alert and oriented to person, place, and time. No cranial nerve deficit.  Skin: Skin is warm and dry.  Psychiatric: She has a normal mood and affect. Thought content normal.   Assessment: 62 y.o. female with UTI   Low back pain  Plan:  Treat UTI, Urine sent  for culture   Ibuprofen for low back pain   Follow up with PCP  ED Course  Procedures (including critical care time)  MDM  Discussed with the patient clinical and lab findings and plan of care. All questioned fully answered. She will return if any problems arise.    Medication List    TAKE these medications       ciprofloxacin 500 MG tablet  Commonly known as:  CIPRO  Take 1 tablet (500 mg total) by mouth every 12 (twelve) hours.     phenazopyridine 200 MG tablet  Commonly known as:  PYRIDIUM  Take 1 tablet (200 mg total) by mouth 3  (three) times daily.      ASK your doctor about these medications       aspirin EC 81 MG tablet  Take 81 mg by mouth every morning.     omeprazole 20 MG capsule  Commonly known as:  PRILOSEC  Take 20 mg by mouth every morning.               [facility] Bunnie Pion, [place] 09/01/12 [phone]

## 2012-09-01 NOTE — ED Provider Notes (Signed)
Medical screening examination/treatment/procedure(s) were performed by non-physician practitioner and as supervising physician I was immediately available for consultation/collaboration.  Virgel Manifold, MD 09/01/12 2056

## 2012-09-01 NOTE — ED Notes (Signed)
Pt c/o burning with urination, strong odor, lower back pain, left side flank pain that radiates around to abd area, symptoms started 3 weeks

## 2012-09-03 LAB — URINE CULTURE: Colony Count: >=100000

## 2012-09-04 ENCOUNTER — Telehealth (HOSPITAL_COMMUNITY): Payer: Self-pay | Admitting: Emergency Medicine

## 2012-09-04 NOTE — ED Notes (Signed)
Results received from [facility]. (+) URNC -> Rx given in ED for Cipro -> sensitive to the same.  Chart appended per protocol.

## 2012-12-11 ENCOUNTER — Encounter (HOSPITAL_COMMUNITY): Payer: Self-pay | Admitting: *Deleted

## 2012-12-11 ENCOUNTER — Emergency Department (HOSPITAL_COMMUNITY)
Admission: EM | Admit: 2012-12-11 | Discharge: 2012-12-11 | Disposition: A | Discharge status: Home / Self Care | Payer: No Typology Code available for payment source | Attending: Emergency Medicine | Admitting: Emergency Medicine

## 2012-12-11 DIAGNOSIS — H109 Unspecified conjunctivitis: Secondary | ICD-10-CM | POA: Insufficient documentation

## 2012-12-11 DIAGNOSIS — Z79899 Other long term (current) drug therapy: Secondary | ICD-10-CM | POA: Insufficient documentation

## 2012-12-11 DIAGNOSIS — Z7982 Long term (current) use of aspirin: Secondary | ICD-10-CM | POA: Insufficient documentation

## 2012-12-11 MED ORDER — FLUORESCEIN SODIUM 1 MG OP STRP
1.0000 | ORAL_STRIP | Freq: Once | OPHTHALMIC | Status: AC
  Administered 2012-12-11: 1 via OPHTHALMIC

## 2012-12-11 MED ORDER — TETRACAINE HCL 0.5 % OP SOLN
OPHTHALMIC | Status: AC
  Filled 2012-12-11: qty 2

## 2012-12-11 MED ORDER — FLUORESCEIN SODIUM 1 MG OP STRP
ORAL_STRIP | OPHTHALMIC | Status: AC
  Filled 2012-12-11: qty 1

## 2012-12-11 MED ORDER — TOBRAMYCIN 0.3 % OP SOLN
2.0000 [drp] | Freq: Once | OPHTHALMIC | Status: AC
  Administered 2012-12-11: 2 [drp] via OPHTHALMIC
  Filled 2012-12-11: qty 5

## 2012-12-11 MED ORDER — TETRACAINE HCL 0.5 % OP SOLN
1.0000 [drp] | Freq: Once | OPHTHALMIC | Status: AC
  Administered 2012-12-11: 1 [drp] via OPHTHALMIC

## 2012-12-11 MED ORDER — KETOROLAC TROMETHAMINE 0.5 % OP SOLN
1.0000 [drp] | Freq: Once | OPHTHALMIC | Status: AC
  Administered 2012-12-11: 1 [drp] via OPHTHALMIC
  Filled 2012-12-11 (×2): qty 5

## 2012-12-11 NOTE — ED Provider Notes (Signed)
History    CSN: [id number] Arrival date & time 12/11/12  2376  First MD Initiated Contact with Patient 12/11/12 1007     Chief Complaint  Patient presents with  . Foreign Body in [place]  . Eye Pain   (Consider location/radiation/quality/duration/timing/severity/associated sxs/prior Treatment) HPI Comments: Rebecca Osborne is a 62 y.o. Female presenting with left eye pain with foreign body sensation for the past 2 days.  She was assisting a neighbor trim branches a Environmental health practitioner that were growing over her driveway when she felt a foreign body sensation.  She has had sensation of foreign body since the incident but is unable to find.  She has tried to flush her eye without improvement.  She does not have any visual changes.  Her eye is red and has been draining clear tears.  She has had no helpful medicines or treatment for this complaint.     The history is provided by the patient.   History reviewed. No pertinent past medical history. Past Surgical History  Procedure Laterality Date  . Abdominal hysterectomy    . Cholecystectomy     History reviewed. No pertinent family history. History  Substance Use Topics  . Smoking status: Never Smoker   . Smokeless tobacco: Not on file  . Alcohol Use: No   OB History   Grav Para Term Preterm Abortions TAB SAB Ect Mult Living                 Review of Systems  Constitutional: Negative for fever and chills.  HENT: Negative for congestion, sore throat, rhinorrhea and neck pain.   Eyes: Positive for pain, redness and itching. Negative for photophobia, discharge and visual disturbance.  Respiratory: Negative for chest tightness and shortness of breath.   Cardiovascular: Negative.   Gastrointestinal: Negative.   Genitourinary: Negative.   Musculoskeletal: Negative.   Skin: Negative.  Negative for rash and wound.  Neurological: Negative for dizziness, light-headedness and numbness.  Psychiatric/Behavioral: Negative.     Allergies   Codeine  Home Medications   Current Outpatient Rx  Name  Route  Sig  Dispense  Refill  . aspirin EC 81 MG tablet   Oral   Take 81 mg by mouth every morning.         Marland Kitchen ibuprofen (ADVIL,MOTRIN) 200 MG tablet   Oral   Take 600 mg by mouth every 6 (six) hours as needed for pain.         Marland Kitchen omeprazole (PRILOSEC) 20 MG capsule   Oral   Take 20 mg by mouth every morning.          BP 147/79  Pulse 64  Temp(Src) 97.1 F (36.2 C) (Oral)  Resp 15  Ht 5' 1"  (1.549 m)  Wt 151 lb (68.493 kg)  BMI 28.55 kg/m2  SpO2 100% Physical Exam  Nursing note and vitals reviewed. Constitutional: She appears well-developed and well-nourished.  HENT:  Head: Normocephalic and atraumatic.  Eyes: EOM are normal. Pupils are equal, round, and reactive to light. Left eye exhibits no chemosis. Left conjunctiva is injected. Left conjunctiva has no hemorrhage. Left eye exhibits normal extraocular motion.  Fundoscopic exam:      The left eye shows no hemorrhage. The left eye shows red reflex.  Slit lamp exam:      The left eye shows no corneal abrasion, no corneal flare, no corneal ulcer, no foreign body, no hyphema and no fluorescein uptake.  Visual acuity normal per nursing notes.  Neck: Normal range of motion.  Cardiovascular: Normal rate, regular rhythm, normal heart sounds and intact distal pulses.   Pulmonary/Chest: Effort normal and breath sounds normal. She has no wheezes.  Abdominal: Soft. Bowel sounds are normal. There is no tenderness.  Musculoskeletal: Normal range of motion.  Neurological: She is alert.  Skin: Skin is warm and dry.  Psychiatric: She has a normal mood and affect.    ED Course  Procedures (including critical care time) Labs Reviewed - No data to display No results found. No diagnosis found.  MDM  No fb or corneal abrasion seen.  Pts eye was flushed with saline without improvement in sx.  She was given ketorolac gtts,  tobrex gtts,  Referral for recheck by Dr Iona Hansen  in 24 hours if sx persist.  Evalee Jefferson, PA-C 12/11/12 1158

## 2012-12-11 NOTE — ED Notes (Signed)
Acular ordered from pharmacy.

## 2012-12-11 NOTE — ED Notes (Addendum)
Debris from cutting tree limbs fell in L eye on Sunday.  Monday, eye began to hurt and feel irritated.  L eye is reddened and painful.  This morning eye was crusted shut.

## 2012-12-14 NOTE — ED Provider Notes (Signed)
Medical screening examination/treatment/procedure(s) were performed by non-physician practitioner and as supervising physician I was immediately available for consultation/collaboration.   Alfonzo Feller, DO 12/14/12 [phone]

## 2014-02-08 ENCOUNTER — Encounter (HOSPITAL_COMMUNITY): Payer: Self-pay | Admitting: Emergency Medicine

## 2014-02-08 ENCOUNTER — Emergency Department (HOSPITAL_COMMUNITY)
Admission: EM | Admit: 2014-02-08 | Discharge: 2014-02-08 | Disposition: A | Discharge status: Home / Self Care | Payer: No Typology Code available for payment source | Attending: Emergency Medicine | Admitting: Emergency Medicine

## 2014-02-08 DIAGNOSIS — M545 Low back pain, unspecified: Secondary | ICD-10-CM | POA: Diagnosis not present

## 2014-02-08 DIAGNOSIS — R300 Dysuria: Secondary | ICD-10-CM

## 2014-02-08 DIAGNOSIS — Z79899 Other long term (current) drug therapy: Secondary | ICD-10-CM | POA: Diagnosis not present

## 2014-02-08 DIAGNOSIS — R350 Frequency of micturition: Secondary | ICD-10-CM | POA: Insufficient documentation

## 2014-02-08 DIAGNOSIS — Z7982 Long term (current) use of aspirin: Secondary | ICD-10-CM | POA: Diagnosis not present

## 2014-02-08 DIAGNOSIS — K219 Gastro-esophageal reflux disease without esophagitis: Secondary | ICD-10-CM | POA: Insufficient documentation

## 2014-02-08 DIAGNOSIS — R1084 Generalized abdominal pain: Secondary | ICD-10-CM | POA: Insufficient documentation

## 2014-02-08 HISTORY — DX: Gastro-esophageal reflux disease without esophagitis: K21.9

## 2014-02-08 LAB — URINALYSIS, ROUTINE W REFLEX MICROSCOPIC
Bilirubin Urine: NEGATIVE
GLUCOSE, UA: NEGATIVE mg/dL
HGB URINE DIPSTICK: NEGATIVE
Ketones, ur: NEGATIVE mg/dL
Nitrite: NEGATIVE
PH: 6 (ref 5.0–8.0)
PROTEIN: NEGATIVE mg/dL
SPECIFIC GRAVITY, URINE: 1.02 (ref 1.005–1.030)
Urobilinogen, UA: 0.2 mg/dL (ref 0.0–1.0)

## 2014-02-08 LAB — URINE MICROSCOPIC-ADD ON

## 2014-02-08 MED ORDER — PHENAZOPYRIDINE HCL 100 MG PO TABS
100.0000 mg | ORAL_TABLET | Freq: Once | ORAL | Status: AC
  Administered 2014-02-08: 100 mg via ORAL
  Filled 2014-02-08: qty 1

## 2014-02-08 MED ORDER — PHENAZOPYRIDINE HCL 100 MG PO TABS: 100.0000 mg | ORAL_TABLET | Freq: Three times a day (TID) | ORAL | Status: AC

## 2014-02-08 NOTE — ED Notes (Signed)
Patient with no complaints at this time. Respirations even and unlabored. Skin warm/dry. Discharge instructions reviewed with patient at this time. Patient given opportunity to voice concerns/ask questions. Patient discharged at this time and left Emergency Department with steady gait.   

## 2014-02-08 NOTE — ED Notes (Signed)
Pt c/o increased urinary frequency, burning and lower back pain worsening over the past 2 weeks.

## 2014-02-08 NOTE — Discharge Instructions (Signed)
As discussed, your evaluation today has been largely reassuring.  But, it is important that you monitor your condition carefully, and do not hesitate to return to the ED if you develop new, or concerning changes in your condition.  Otherwise, please follow-up with your physician for appropriate ongoing care.     Emergency Department Resource Guide 1) Find a Doctor and Pay Out of Pocket Although you won't have to find out who is covered by your insurance plan, it is a good idea to ask around and get recommendations. You will then need to call the office and see if the doctor you have chosen will accept you as a new patient and what types of options they offer for patients who are self-pay. Some doctors offer discounts or will set up payment plans for their patients who do not have insurance, but you will need to ask so you aren't surprised when you get to your appointment.  2) Contact Your Local Health Department Not all health departments have doctors that can see patients for sick visits, but many do, so it is worth a call to see if yours does. If you don't know where your local health department is, you can check in your phone book. The CDC also has a tool to help you locate your state's health department, and many state websites also have listings of all of their local health departments.  3) Find a [facility] If your illness is not likely to be very severe or complicated, you may want to try a walk in clinic. These are popping up all over the country in pharmacies, drugstores, and shopping centers. They're usually staffed by nurse practitioners or physician assistants that have been trained to treat common illnesses and complaints. They're usually fairly quick and inexpensive. However, if you have serious medical issues or chronic medical problems, these are probably not your best option.  No Primary Care Doctor: - Call Health Connect at  [phone] - they can help you locate a primary care  doctor that  accepts your insurance, provides certain services, etc. - Physician Referral Service- [phone]  Chronic Pain Problems: Organization         Address  Phone   Notes  [facility]  [phone] Patients need to be referred by their primary care doctor.   Medication Assistance: Organization         Address  Phone   Notes  [facility] Medication [facility] [address] [place], [place] [address] [phone] --Must be a resident of [facility] -- Must have NO insurance coverage whatsoever (no Medicaid/ Medicare, etc.) -- The pt. MUST have a primary care doctor that directs their care regularly and follows them in the community   [facility]ist  [phone]   [employer]  [phone]    Agencies that provide inexpensive medical care: Organization         Address  Phone   Notes  [place]  [phone]   [facility]    [phone]   [facility] [place], [place] [address] [phone]   [place] [address], [place] [phone]   Planned Parenthood    [phone]   [facility]    [phone]   [place] and [place] [address], [place] Phone:  [phone], Fax:  [phone] Hours of Operation:  9 am - 6 pm, M-F.  Also accepts Medicaid/Medicare and self-pay.  [facility] for [address] [place], [address], [place] Phone: [phone], Fax: [phone]. Hours of Operation:  8:30 am - 5:30 pm, M-F.  Also accepts Medicaid and self-pay.  [facility] [place] [address], [place] Phone: [phone]   [place], [place], [place] [phone], Ext. [phone] Mondays & Thursdays: 7-9 AM.  First 15 patients are seen on a first come, first serve basis.    [place]  Providers:  Organization         Address  Phone   Notes  [facility] [address], [place] [phone] Also accepts self-pay patients.  [facility] [address], [place]  [phone]   [place], [address], [place] [phone]   [facility] [address] [place] [phone]   Lucianne Lei [address], [place]   [phone] Only accepts [place] Access [place] patients after they have their name applied to their card.   Self-Pay (no insurance) in [facility]:  Organization         Address  Phone   Notes  Sickle Cell Patients, [facility] [place] [phone]   [facility] Urgent Care [place] [phone]   [facility] [place]  [place], [place], [place] [phone]   [facility]/Dr. Osei-Bonsu  [address], [place] or [address], [place] [phone] Phone number for both [place] and [place] locations is the same.  Urgent Medical and [facility] [address] [place] [phone]   [facility] [address], [place] or [address] [phone]   [facility] [address], [place] [phone], phone; [phone], fax Sees patients 1st and 3rd Saturday of every month.  Must not qualify for public or private insurance (i.e. Medicaid, Medicare, [place] Health Choice, Veterans' Benefits)  Household income should be no more than 200% of the poverty level The clinic cannot treat you if you are pregnant or think you are pregnant  Sexually transmitted diseases are not treated at the clinic.   Dental Care: Organization         Address  Phone  Notes  [facility] Department of [facility] [phone] Accepts children up to age 54 who are enrolled in [place] or [place]; pregnant women with a Medicaid card; and children who have applied for Medicaid or [place] Health Choice, but were declined, whose parents can pay a reduced fee at time of service.  [facility] Department of [facility]  [address], [place] [phone] Accepts children up to age 28 who are enrolled in [place] or [place]; pregnant women with a Medicaid card; and children who have applied for Medicaid or [place] Health Choice, but were declined, whose parents can pay a reduced fee at time of service.  [place] Adult Dental Access PROGRAM  [place] [phone] Patients are seen by appointment only. Walk-ins are not accepted. [place] will see patients 18 years of  age and older. Monday - Tuesday (8am-5pm) Most Wednesdays (8:30-5pm) $30 per visit, cash only  [facility] Adult Dental Access PROGRAM  [address], [facility] [phone] Patients are seen by appointment only. Walk-ins are not accepted. [place] will see patients 72 years of age and older. One Wednesday Evening (Monthly: Volunteer Based).  $30 per visit, cash only  [place]  [phone] for adults; Children under age 65, call Graduate Pediatric Dentistry at [phone]. Children aged 11-14, please call [phone] to request a pediatric application.  Dental services are provided in all areas of dental care including fillings, crowns and bridges, complete and partial dentures, implants, gum treatment, root canals, and extractions. Preventive care is also provided. Treatment is provided to both adults and children. Patients are selected via a lottery and there is often a waiting list.   [facility] [address] [place]  [phone] [email or web address]   [facility]  [address] [place], [place] [phone], Ext. 123 Second and Fourth Thursday of each month, opens at 6:30 AM; Clinic ends at 9 AM.  Patients are seen on a first-come first-served basis, and a limited number are seen during each clinic.   [facility]  [address] Hillard Danker [place], [place] [phone]   Eligibility Requirements You must have lived in [place], [place], or [place] counties for at least the last three months.   You cannot be eligible for state or federal sponsored [employer], including [employer], [place], or [employer].   You generally cannot be eligible for healthcare insurance through your employer.    How to apply: Eligibility screenings are held every Tuesday and Wednesday afternoon from 1:00 pm until 4:00 pm. You do not need an appointment for the interview!  [facility] [address] [place], [place]   [place] Department  [facility]  [phone]    Behavioral Health Resources in the Community: Intensive Outpatient Programs Organization         Address  Phone  Notes  [facility] [place]. [address] [place], [place] [phone]   [facility] Outpatient [address] [place], [place]   ADS: Alcohol & Drug Svcs [address], [place], [place]   [facility] [address],  [place], [place] or [phone]   Substance Abuse Resources Organization         Address  Phone  Notes  Alcohol and Drug Services  [phone]   [place]  [phone]   [facility] [place]   Chinita Pester  [phone]   Residential & Outpatient Substance Abuse Program  [phone]   Psychological Services Organization         Address  Phone  Notes  [facility] [place]  [place]  [phone]   [place] [address], [place] or [phone]    Mobile Crisis Teams Organization         Address  Phone  Notes  [facility], Mobile Crisis Care Unit  [phone]   [facility]  [address] [place], [place]   [facility] [address] [place] [phone]    Self-Help/Support Groups Organization  Address  Phone             Notes  [place]. of [place] - variety of support groups  [place] Call for more information  Narcotics Anonymous (NA), [facility] [address], [employer]   2 meetings at this location   Special educational needs teacher         Address  Phone  Notes  ASAP Residential Treatment [place],    [place]  [phone]   [facility]  [address] [place] [address], [place], [place]   [facility], [place] [phone] Admissions: 8am-3pm M-F  Incentives Substance [place] [address]    [place], [place] [phone]   [facility] [address] [place], [place], [place]   [facility] [address]  [place], [place]   [facility] Programs - Intensive Outpatient [address], [place], [place]   [facility] [place].,  [place], [place] [phone] or [phone]   [facility] Services (RTS) [address] [place], [place] Accepts Medicaid  [facility] [place] [address],  [place] [phone] Substance Abuse/Addiction Treatment   [facility] Organization         Address  Phone  Notes  [facility]  [phone]   Domenic Schwab, PhD [address] [place], [place]   [phone] or [phone]    [place], [place] [phone]   [facility] [address] [place], [place] [phone] Insurance/Medicaid/sponsorship through [facility] and Families [address] [place]                                    [place], [place] [phone] [place] [address], [place] [phone]    Dr. Adele Schilder  [phone]   [facility] Dept. 1) [address] [place] 2) [place] 3)  [place] [address], [place] [phone]  [phone]   [place] [phone] or [phone] (After Hours)

## 2014-02-08 NOTE — ED Provider Notes (Signed)
CSN: [id number]     Arrival date & time 02/08/14  1707 History   First MD Initiated Contact with Patient 02/08/14 1728     Chief Complaint  Patient presents with  . Urinary Frequency     (Consider location/radiation/quality/duration/timing/severity/associated sxs/prior Treatment) HPI Patient presents with concern of weeks of increasing urinary frequency, dysuria, left lower back pain. No clear precipitant.  Since onset has been persistent. No hematuria, fevers, chills, nausea, vomiting, diarrhea. No anorexia. Patient has not seen a physician in the weeks since this began.  Past Medical History  Diagnosis Date  . GERD (gastroesophageal reflux disease)    Past Surgical History  Procedure Laterality Date  . Abdominal hysterectomy    . Cholecystectomy     No family history on file. History  Substance Use Topics  . Smoking status: Never Smoker   . Smokeless tobacco: Not on file  . Alcohol Use: No   OB History   Grav Para Term Preterm Abortions TAB SAB Ect Mult Living            3     Review of Systems  Constitutional:       Per HPI, otherwise negative  HENT:       Per HPI, otherwise negative  Respiratory:       Per HPI, otherwise negative  Cardiovascular:       Per HPI, otherwise negative  Gastrointestinal: Negative for vomiting.  Endocrine:       Negative aside from HPI  Genitourinary:       Neg aside from HPI   Musculoskeletal:       Per HPI, otherwise negative  Skin: Negative.   Neurological: Negative for syncope.      Allergies  Codeine  Home Medications   Prior to Admission medications   Medication Sig Start Date End Date Taking? Authorizing Provider  aspirin EC 81 MG tablet Take 81 mg by mouth every morning.   Yes Historical Provider, MD  ibuprofen (ADVIL,MOTRIN) 200 MG tablet Take 600 mg by mouth every 6 (six) hours as needed for pain.   Yes Historical Provider, MD  omeprazole (PRILOSEC) 20 MG capsule Take 20 mg by mouth every morning.   Yes  Historical Provider, MD   BP 130/75  Pulse 68  Temp(Src) 98.7 F (37.1 C) (Oral)  Resp 18  Ht 5' 1"  (1.549 m)  Wt 143 lb (64.864 kg)  BMI 27.03 kg/m2  SpO2 99% Physical Exam  Nursing note and vitals reviewed. Constitutional: She is oriented to person, place, and time. She appears well-developed and well-nourished. No distress.  HENT:  Head: Normocephalic and atraumatic.  Eyes: Conjunctivae and EOM are normal.  Cardiovascular: Normal rate and regular rhythm.   Pulmonary/Chest: Effort normal and breath sounds normal. No stridor. No respiratory distress.  Abdominal: She exhibits no distension. There is no tenderness.  Soft, non-peritoneal abdomen  Musculoskeletal: She exhibits no edema.  Neurological: She is alert and oriented to person, place, and time. No cranial nerve deficit.  Skin: Skin is warm and dry.  Psychiatric: She has a normal mood and affect.    ED Course  Procedures (including critical care time) Labs Review Labs Reviewed  URINALYSIS, ROUTINE W REFLEX MICROSCOPIC - Abnormal; Notable for the following:    Leukocytes, UA TRACE (*)    All other components within normal limits  URINE MICROSCOPIC-ADD ON     MDM   The patient presents with weeks of polyuria, without evidence for systemic infection.  Patient's urinalysis does  not suggest overt urinary tract infection either, though the patient may have cystitis versus urethritis. A urinary culture was sent, received Pyridium for her symptoms, and she was discharged in stable condition.  Carmin Muskrat, MD 02/08/14 [phone]

## 2015-06-04 ENCOUNTER — Encounter (HOSPITAL_COMMUNITY): Payer: Self-pay | Admitting: Emergency Medicine

## 2015-06-04 ENCOUNTER — Emergency Department (HOSPITAL_COMMUNITY)
Admission: EM | Admit: 2015-06-04 | Discharge: 2015-06-04 | Disposition: A | Discharge status: Home / Self Care | Payer: PRIVATE HEALTH INSURANCE | Attending: Emergency Medicine | Admitting: Emergency Medicine

## 2015-06-04 DIAGNOSIS — Z7982 Long term (current) use of aspirin: Secondary | ICD-10-CM | POA: Insufficient documentation

## 2015-06-04 DIAGNOSIS — H109 Unspecified conjunctivitis: Secondary | ICD-10-CM

## 2015-06-04 DIAGNOSIS — K219 Gastro-esophageal reflux disease without esophagitis: Secondary | ICD-10-CM | POA: Diagnosis not present

## 2015-06-04 DIAGNOSIS — H578 Other specified disorders of eye and adnexa: Secondary | ICD-10-CM | POA: Diagnosis present

## 2015-06-04 DIAGNOSIS — R0981 Nasal congestion: Secondary | ICD-10-CM | POA: Diagnosis not present

## 2015-06-04 MED ORDER — TOBRAMYCIN 0.3 % OP SOLN: 2.0000 [drp] | OPHTHALMIC | Status: DC

## 2015-06-04 NOTE — Discharge Instructions (Signed)

## 2015-06-04 NOTE — ED Notes (Signed)
PT c/o nasal congestion with yellow/clear drainage x1 week and states left eye redness but denies drainage x5days.

## 2015-06-04 NOTE — ED Provider Notes (Signed)
CSN: [id number]     Arrival date & time 06/04/15  1660 History   First MD Initiated Contact with Patient 06/04/15 [phone]     Chief Complaint  Patient presents with  . Nasal Congestion     (Consider location/radiation/quality/duration/timing/severity/associated sxs/prior Treatment) Patient is a 64 y.o. female presenting with eye injury. The history is provided by the patient. No language interpreter was used.  Eye Injury This is a new problem. The current episode started in the past 7 days. The problem occurs constantly. The problem has been gradually worsening. Associated symptoms include congestion. Pertinent negatives include no coughing, fever or sore throat. Nothing aggravates the symptoms. She has tried nothing for the symptoms. The treatment provided mild relief.    Past Medical History  Diagnosis Date  . GERD (gastroesophageal reflux disease)    Past Surgical History  Procedure Laterality Date  . Abdominal hysterectomy    . Cholecystectomy     History reviewed. No pertinent family history. Social History  Substance Use Topics  . Smoking status: Never Smoker   . Smokeless tobacco: None  . Alcohol Use: No   OB History    Gravida Para Term Preterm AB TAB SAB Ectopic Multiple Living            3     Review of Systems  Constitutional: Negative for fever.  HENT: Positive for congestion. Negative for sore throat.   Respiratory: Negative for cough.   All other systems reviewed and are negative.     Allergies  Codeine  Home Medications   Prior to Admission medications   Medication Sig Start Date End Date Taking? Authorizing Provider  aspirin EC 81 MG tablet Take 81 mg by mouth every morning.    Historical Provider, MD  ibuprofen (ADVIL,MOTRIN) 200 MG tablet Take 600 mg by mouth every 6 (six) hours as needed for pain.    Historical Provider, MD  omeprazole (PRILOSEC) 20 MG capsule Take 20 mg by mouth every morning.    Historical Provider, MD   BP 101/46 mmHg   Pulse 79  Temp(Src) 97.7 F (36.5 C) (Oral)  Resp 16  Ht 5' 1"  (1.549 m)  Wt 59.875 kg  BMI 24.95 kg/m2  SpO2 98% Physical Exam  Constitutional: She is oriented to person, place, and time. She appears well-developed and well-nourished.  HENT:  Head: Normocephalic and atraumatic.  Right Ear: External ear normal.  Left Ear: External ear normal.  Nose: Nose normal.  Mouth/Throat: Oropharynx is clear and moist.  Eyes: EOM are normal. Pupils are equal, round, and reactive to light.  injected  Neck: Normal range of motion.  Cardiovascular: Normal rate.   Pulmonary/Chest: Effort normal.  Abdominal: Soft. She exhibits no distension.  Musculoskeletal: Normal range of motion.  Neurological: She is alert and oriented to person, place, and time.  Skin: Skin is warm.  Psychiatric: She has a normal mood and affect.  Nursing note and vitals reviewed.   ED Course  Procedures (including critical care time) Labs Review Labs Reviewed - No data to display  Imaging Review No results found. I have personally reviewed and evaluated these images and lab results as part of my medical decision-making.   EKG Interpretation None      MDM   Final diagnoses:  Bilateral conjunctivitis    tobrex Out of work today and tomorrow    Fransico Meadow, PA-C 06/04/15 [place], PA-C 06/04/15 6010  Merrily Pew, MD 06/04/15 1019

## 2015-07-13 ENCOUNTER — Encounter: Payer: Self-pay | Admitting: Family Medicine

## 2015-07-14 ENCOUNTER — Encounter: Payer: Self-pay | Admitting: Family Medicine

## 2015-07-14 ENCOUNTER — Ambulatory Visit (INDEPENDENT_AMBULATORY_CARE_PROVIDER_SITE_OTHER): Payer: No Typology Code available for payment source | Admitting: Family Medicine

## 2015-07-14 ENCOUNTER — Ambulatory Visit: Payer: Self-pay | Admitting: Family Medicine

## 2015-07-14 VITALS — BP 118/70 | HR 64 | Temp 98.2°F | Resp 18 | Ht 61.0 in | Wt 122.0 lb

## 2015-07-14 DIAGNOSIS — Z7689 Persons encountering health services in other specified circumstances: Secondary | ICD-10-CM

## 2015-07-14 DIAGNOSIS — Z0000 Encounter for general adult medical examination without abnormal findings: Secondary | ICD-10-CM

## 2015-07-14 DIAGNOSIS — Z7189 Other specified counseling: Secondary | ICD-10-CM | POA: Diagnosis not present

## 2015-07-14 LAB — CBC WITH DIFFERENTIAL/PLATELET
Basophils Absolute: 0 10*3/uL (ref 0.0–0.1)
Basophils Relative: 0 % (ref 0–1)
Eosinophils Absolute: 0.1 10*3/uL (ref 0.0–0.7)
Eosinophils Relative: 2 % (ref 0–5)
HEMATOCRIT: 38.1 % (ref 36.0–46.0)
HEMOGLOBIN: 12.6 g/dL (ref 12.0–15.0)
LYMPHS PCT: 37 % (ref 12–46)
Lymphs Abs: 2.1 10*3/uL (ref 0.7–4.0)
MCH: 30.2 pg (ref 26.0–34.0)
MCHC: 33.1 g/dL (ref 30.0–36.0)
MCV: 91.4 fL (ref 78.0–100.0)
MONO ABS: 0.5 10*3/uL (ref 0.1–1.0)
MONOS PCT: 8 % (ref 3–12)
MPV: 10.1 fL (ref 8.6–12.4)
NEUTROS ABS: 3.1 10*3/uL (ref 1.7–7.7)
Neutrophils Relative %: 53 % (ref 43–77)
Platelets: 245 10*3/uL (ref 150–400)
RBC: 4.17 MIL/uL (ref 3.87–5.11)
RDW: 13.9 % (ref 11.5–15.5)
WBC: 5.8 10*3/uL (ref 4.0–10.5)

## 2015-07-14 LAB — COMPLETE METABOLIC PANEL WITH GFR
ALBUMIN: 4.2 g/dL (ref 3.6–5.1)
ALK PHOS: 73 U/L (ref 33–130)
ALT: 14 U/L (ref 6–29)
AST: 18 U/L (ref 10–35)
BILIRUBIN TOTAL: 0.3 mg/dL (ref 0.2–1.2)
BUN: 17 mg/dL (ref 7–25)
CALCIUM: 9.3 mg/dL (ref 8.6–10.4)
CO2: 29 mmol/L (ref 20–31)
Chloride: 104 mmol/L (ref 98–110)
Creat: 0.8 mg/dL (ref 0.50–0.99)
GFR, Est African American: >89 mL/min (ref >=60)
GFR, Est Non African American: 78 mL/min (ref >=60)
Glucose, Bld: 85 mg/dL (ref 70–99)
POTASSIUM: 5 mmol/L (ref 3.5–5.3)
Sodium: 140 mmol/L (ref 135–146)
TOTAL PROTEIN: 6.8 g/dL (ref 6.1–8.1)

## 2015-07-14 LAB — LIPID PANEL
CHOL/HDL RATIO: 3.7 ratio (ref <=5.0)
CHOLESTEROL: 198 mg/dL (ref 125–200)
HDL: 54 mg/dL (ref >=46)
LDL Cholesterol: 130 mg/dL — ABNORMAL HIGH (ref <130)
Triglycerides: 70 mg/dL (ref <150)
VLDL: 14 mg/dL (ref <30)

## 2015-07-14 NOTE — Progress Notes (Signed)
Subjective:    Patient ID: Rebecca Osborne, female    DOB: 1951/01/03, 65 y.o.   MRN: [id number]  HPI Patient is a pleasant 65 year old white female here today to establish care. She is a widow. She has 3 grown children who live close by. She still works at [employer]. She does not smoke or drink. 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 She also believes she has a bladder prolapse. Aside from being uncomfortable and causing urinary leakage, there is no pain. She does report one month of chronic constipation. Her last colonoscopy was apparently per her report in 2011 and was normal. She is due for a flu shot and a tetanus shot but she declines both. She is overdue for mammogram. She is overdue for a bone density test and she has a history of a total abdominal hysterectomy and bilateral salpingo-oophorectomy in 2005 due to cervical cancer. Past Medical History  Diagnosis Date  . GERD (gastroesophageal reflux disease)   . Allergy   . Cancer [facility])     cervical cancer cells   Past Surgical History  Procedure Laterality Date  . Abdominal hysterectomy    . Fracture surgery      rt leg  . Cholecystectomy  2005   Current Outpatient Prescriptions on File Prior to Visit  Medication Sig Dispense Refill  . aspirin EC 81 MG tablet Take 81 mg by mouth every morning.    Marland Kitchen ibuprofen (ADVIL,MOTRIN) 200 MG tablet Take 600 mg by mouth every 6 (six) hours as needed for pain.    Marland Kitchen omeprazole (PRILOSEC) 20 MG capsule Take 20 mg by mouth every morning.     No current facility-administered medications on file prior to visit.   Allergies  Allergen Reactions  . Codeine Itching   Social History   Social History  . Marital Status: Divorced    Spouse Name: N/A  . Number of Children: N/A  . Years of Education: N/A   Occupational History  . Not on file.   Social History Main Topics  . Smoking status:  Never Smoker   . Smokeless tobacco: Never Used  . Alcohol Use: No  . Drug Use: No  . Sexual Activity: Not Currently   Other Topics Concern  . Not on file   Social History Narrative   Family History  Problem Relation Age of Onset  . COPD Mother   . Heart disease Mother   . Miscarriages / [place] Mother   . Vision loss Mother     from shingles  . COPD Father   . Cancer Brother     leukemia  . Early death Brother       Review of Systems  All other systems reviewed and are negative.      Objective:   Physical Exam  Constitutional: She is oriented to person, place, and time. She appears well-developed and well-nourished. No distress.  HENT:  Head: Normocephalic and atraumatic.  Right Ear: External ear normal.  Left Ear: External ear normal.  Nose: Nose normal.  Mouth/Throat: Oropharynx is clear and moist. No oropharyngeal exudate.  Eyes: Conjunctivae and EOM are normal. Pupils are equal, round, and reactive to light. Right eye exhibits no discharge. Left eye exhibits no discharge. No scleral icterus.  Neck: Normal range of motion. Neck supple. No JVD present. No tracheal deviation present. No thyromegaly present.  Cardiovascular: Normal rate, regular rhythm, normal heart sounds and intact distal pulses.  Exam reveals no gallop and no friction rub.   No murmur heard. Pulmonary/Chest: Effort normal and breath sounds normal. No stridor. No respiratory distress. She has no wheezes. She has no rales. She exhibits no tenderness.  Abdominal: Soft. Bowel sounds are normal. She exhibits no distension and no mass. There is no tenderness. There is no rebound and no guarding.  Musculoskeletal: Normal range of motion. She exhibits no edema or tenderness.  Lymphadenopathy:    She has no cervical adenopathy.  Neurological: She is alert and oriented to person, place, and time. She has normal reflexes. She displays normal reflexes. No cranial nerve deficit. She exhibits normal muscle  tone. Coordination normal.  Skin: Rash noted. She is not diaphoretic.  Psychiatric: She has a normal mood and affect. Her behavior is normal. Judgment and thought content normal.  Vitals reviewed. Patient has an erythematous scaly papule on the dorsum of her left tricep that appears to be a possible skin cancer. She also has a venous hemangioma on her right lower lip.   Pelvic exam is performed. There is a slight bladder prolapse. On Valsalva, the bladder does drop slightly however it does not enter the hymenal ring.     Assessment & Plan:  Establishing care with new doctor, encounter for  Routine general medical examination at a health care facility - Plan: CBC with Differential/Platelet, COMPLETE METABOLIC PANEL WITH GFR, Lipid panel, PAP, Thin Prep w/HPV rflx HPV Type 16/18, MM Digital Screening, DG Bone Density  Offered the patient a flu shot and a tetanus shot but she declined. Colonoscopy was performed in 2011 is up-to-date. Pap smear is performed today and sent to pathology. I will schedule the patient for a bone density test along with a mammogram. I will also obtain a CBC, CMP, fasting lipid panel. I recommended MiraLAX for constipation. If constipation does not improve on MiraLAX, I would recommend further workup. She does have a bladder prolapse however it is not severe at this point. If symptoms worsen, I would recommend a referral to urology.

## 2015-07-16 LAB — PAP, THIN PREP W/HPV RFLX HPV TYPE 16/18: HPV DNA HIGH RISK: NOT DETECTED

## 2015-07-17 ENCOUNTER — Telehealth: Payer: Self-pay | Admitting: Family Medicine

## 2015-07-17 ENCOUNTER — Other Ambulatory Visit: Payer: Self-pay | Admitting: Family Medicine

## 2015-07-17 ENCOUNTER — Encounter: Payer: Self-pay | Admitting: Family Medicine

## 2015-07-17 DIAGNOSIS — E2839 Other primary ovarian failure: Secondary | ICD-10-CM

## 2015-07-17 NOTE — Telephone Encounter (Signed)
Patient aware of results via mailed letter

## 2015-07-17 NOTE — Telephone Encounter (Signed)
-----   Message from Susy Frizzle, MD sent at 07/16/2015  7:20 AM EST ----- Labs excellent and pap is nml

## 2015-07-31 ENCOUNTER — Telehealth: Payer: Self-pay | Admitting: *Deleted

## 2015-07-31 NOTE — Telephone Encounter (Signed)
Pt called stating that she is needing referral to Dermatology and that provider was suppose to schedule appt.Pt stated that she called [facility] to schedule appt and they did not accept her insurance (has Multipl[employer] [place]) so wanted to know if I could find out who would. I looked up pt insurance online to see who would accept and [facility] accepts her insurance. I scheduled pt for Wednesday March 1 at 11am with [employer]. Pt aware of appt

## 2015-08-11 ENCOUNTER — Other Ambulatory Visit: Payer: Self-pay

## 2015-08-11 DIAGNOSIS — Z1231 Encounter for screening mammogram for malignant neoplasm of breast: Secondary | ICD-10-CM

## 2015-08-14 ENCOUNTER — Ambulatory Visit (INDEPENDENT_AMBULATORY_CARE_PROVIDER_SITE_OTHER): Payer: No Typology Code available for payment source | Admitting: Family Medicine

## 2015-08-14 ENCOUNTER — Encounter: Payer: Self-pay | Admitting: Family Medicine

## 2015-08-14 VITALS — BP 98/60 | HR 78 | Temp 98.4°F | Resp 14 | Ht 60.0 in | Wt 118.0 lb

## 2015-08-14 DIAGNOSIS — M533 Sacrococcygeal disorders, not elsewhere classified: Secondary | ICD-10-CM | POA: Diagnosis not present

## 2015-08-14 NOTE — Progress Notes (Signed)
   Subjective:    Patient ID: Rebecca Osborne, female    DOB: 1951/05/22, 65 y.o.   MRN: [id number]  HPI Patient's coccyx is extremely sore to the touch. It has been that way for the last 2 weeks. She denies any injury. There is no erythema over the skin in that area. There is no fluctuance in the skin. There is no discharge or pilonidal cyst. There is no palpable abnormality although it is tender to palpation of the coccyx. Past Medical History  Diagnosis Date  . GERD (gastroesophageal reflux disease)   . Allergy   . Cancer [facility])     cervical cancer cells   Past Surgical History  Procedure Laterality Date  . Abdominal hysterectomy    . Fracture surgery      rt leg  . Cholecystectomy  2005   Current Outpatient Prescriptions on File Prior to Visit  Medication Sig Dispense Refill  . aspirin EC 81 MG tablet Take 81 mg by mouth every morning.    Marland Kitchen ibuprofen (ADVIL,MOTRIN) 200 MG tablet Take 600 mg by mouth every 6 (six) hours as needed for pain.    Marland Kitchen omeprazole (PRILOSEC) 20 MG capsule Take 20 mg by mouth every morning.     No current facility-administered medications on file prior to visit.   Allergies  Allergen Reactions  . Codeine Itching   Social History   Social History  . Marital Status: Divorced    Spouse Name: N/A  . Number of Children: N/A  . Years of Education: N/A   Occupational History  . Not on file.   Social History Main Topics  . Smoking status: Never Smoker   . Smokeless tobacco: Never Used  . Alcohol Use: No  . Drug Use: No  . Sexual Activity: Not Currently   Other Topics Concern  . Not on file   Social History Narrative       Review of Systems  All other systems reviewed and are negative.      Objective:   Physical Exam  Cardiovascular: Normal rate, regular rhythm and normal heart sounds.   Pulmonary/Chest: Effort normal and breath sounds normal.  Musculoskeletal:       Lumbar back: She exhibits bony tenderness and pain. She exhibits  normal range of motion.  Vitals reviewed.         Assessment & Plan:  Coccydynia - Plan: DG Sacrum/Coccyx  Ibuprofen 800 mg every 8 hours. Proceed with an x-ray of the sacrum and the coccyx. Recommended relative rest and also that the patient sit on a cushion/hemorrhoid pillow for the next 2 weeks to ease some of the irritation as the majority of her pain occurs when she is driving.

## 2015-08-28 ENCOUNTER — Ambulatory Visit
Admission: RE | Admit: 2015-08-28 | Discharge: 2015-08-28 | Disposition: A | Discharge status: Home / Self Care | Payer: PRIVATE HEALTH INSURANCE | Source: Ambulatory Visit

## 2015-08-28 ENCOUNTER — Other Ambulatory Visit: Payer: Self-pay

## 2015-08-28 ENCOUNTER — Ambulatory Visit
Admission: RE | Admit: 2015-08-28 | Discharge: 2015-08-28 | Disposition: A | Discharge status: Home / Self Care | Payer: PRIVATE HEALTH INSURANCE | Source: Ambulatory Visit | Attending: Family Medicine | Admitting: Family Medicine

## 2015-08-28 DIAGNOSIS — E2839 Other primary ovarian failure: Secondary | ICD-10-CM

## 2015-08-28 DIAGNOSIS — Z1231 Encounter for screening mammogram for malignant neoplasm of breast: Secondary | ICD-10-CM

## 2015-08-31 ENCOUNTER — Encounter: Payer: Self-pay | Admitting: Family Medicine

## 2015-09-02 ENCOUNTER — Encounter: Payer: Self-pay | Admitting: Family Medicine

## 2015-12-01 ENCOUNTER — Telehealth: Payer: Self-pay | Admitting: Family Medicine

## 2016-02-23 DIAGNOSIS — L820 Inflamed seborrheic keratosis: Secondary | ICD-10-CM | POA: Diagnosis not present

## 2016-02-23 DIAGNOSIS — L821 Other seborrheic keratosis: Secondary | ICD-10-CM | POA: Diagnosis not present

## 2016-02-23 DIAGNOSIS — L918 Other hypertrophic disorders of the skin: Secondary | ICD-10-CM | POA: Diagnosis not present

## 2016-06-22 ENCOUNTER — Inpatient Hospital Stay (HOSPITAL_COMMUNITY)
Admission: EM | Admit: 2016-06-22 | Discharge: 2016-06-30 | DRG: 330 | Disposition: A | Discharge status: Home / Self Care | Payer: Medicare Other | Attending: Surgery | Admitting: Surgery

## 2016-06-22 ENCOUNTER — Encounter (HOSPITAL_COMMUNITY): Payer: Self-pay | Admitting: Emergency Medicine

## 2016-06-22 ENCOUNTER — Inpatient Hospital Stay (HOSPITAL_COMMUNITY): Payer: Medicare Other | Admitting: Anesthesiology

## 2016-06-22 ENCOUNTER — Emergency Department (HOSPITAL_COMMUNITY): Payer: Medicare Other

## 2016-06-22 ENCOUNTER — Encounter (HOSPITAL_COMMUNITY)
Admission: EM | Disposition: A | Discharge status: Home / Self Care | Payer: Self-pay | Source: Home / Self Care | Attending: Surgery

## 2016-06-22 DIAGNOSIS — Z8541 Personal history of malignant neoplasm of cervix uteri: Secondary | ICD-10-CM | POA: Diagnosis not present

## 2016-06-22 DIAGNOSIS — Z7401 Bed confinement status: Secondary | ICD-10-CM

## 2016-06-22 DIAGNOSIS — K567 Ileus, unspecified: Secondary | ICD-10-CM | POA: Diagnosis not present

## 2016-06-22 DIAGNOSIS — R109 Unspecified abdominal pain: Secondary | ICD-10-CM | POA: Diagnosis not present

## 2016-06-22 DIAGNOSIS — Z7982 Long term (current) use of aspirin: Secondary | ICD-10-CM | POA: Diagnosis not present

## 2016-06-22 DIAGNOSIS — Z90710 Acquired absence of both cervix and uterus: Secondary | ICD-10-CM | POA: Diagnosis not present

## 2016-06-22 DIAGNOSIS — Z885 Allergy status to narcotic agent status: Secondary | ICD-10-CM | POA: Diagnosis not present

## 2016-06-22 DIAGNOSIS — K56699 Other intestinal obstruction unspecified as to partial versus complete obstruction: Secondary | ICD-10-CM | POA: Diagnosis not present

## 2016-06-22 DIAGNOSIS — K565 Intestinal adhesions [bands], unspecified as to partial versus complete obstruction: Principal | ICD-10-CM | POA: Diagnosis present

## 2016-06-22 DIAGNOSIS — K219 Gastro-esophageal reflux disease without esophagitis: Secondary | ICD-10-CM | POA: Diagnosis not present

## 2016-06-22 DIAGNOSIS — R1031 Right lower quadrant pain: Secondary | ICD-10-CM | POA: Diagnosis not present

## 2016-06-22 DIAGNOSIS — K5909 Other constipation: Secondary | ICD-10-CM | POA: Diagnosis present

## 2016-06-22 DIAGNOSIS — Z79899 Other long term (current) drug therapy: Secondary | ICD-10-CM | POA: Diagnosis not present

## 2016-06-22 DIAGNOSIS — K649 Unspecified hemorrhoids: Secondary | ICD-10-CM | POA: Diagnosis not present

## 2016-06-22 DIAGNOSIS — Z791 Long term (current) use of non-steroidal anti-inflammatories (NSAID): Secondary | ICD-10-CM

## 2016-06-22 DIAGNOSIS — K9189 Other postprocedural complications and disorders of digestive system: Secondary | ICD-10-CM | POA: Diagnosis not present

## 2016-06-22 DIAGNOSIS — G8929 Other chronic pain: Secondary | ICD-10-CM | POA: Diagnosis not present

## 2016-06-22 DIAGNOSIS — M549 Dorsalgia, unspecified: Secondary | ICD-10-CM | POA: Diagnosis not present

## 2016-06-22 DIAGNOSIS — N736 Female pelvic peritoneal adhesions (postinfective): Secondary | ICD-10-CM | POA: Diagnosis present

## 2016-06-22 DIAGNOSIS — L8991 Pressure ulcer of unspecified site, stage 1: Secondary | ICD-10-CM | POA: Diagnosis present

## 2016-06-22 DIAGNOSIS — K56609 Unspecified intestinal obstruction, unspecified as to partial versus complete obstruction: Secondary | ICD-10-CM

## 2016-06-22 DIAGNOSIS — K55029 Acute infarction of small intestine, extent unspecified: Secondary | ICD-10-CM | POA: Diagnosis not present

## 2016-06-22 DIAGNOSIS — I96 Gangrene, not elsewhere classified: Secondary | ICD-10-CM | POA: Diagnosis present

## 2016-06-22 DIAGNOSIS — K55021 Focal (segmental) acute infarction of small intestine: Secondary | ICD-10-CM | POA: Diagnosis not present

## 2016-06-22 DIAGNOSIS — Z4682 Encounter for fitting and adjustment of non-vascular catheter: Secondary | ICD-10-CM | POA: Diagnosis not present

## 2016-06-22 DIAGNOSIS — K5652 Intestinal adhesions [bands] with complete obstruction: Secondary | ICD-10-CM | POA: Diagnosis not present

## 2016-06-22 DIAGNOSIS — R339 Retention of urine, unspecified: Secondary | ICD-10-CM | POA: Diagnosis not present

## 2016-06-22 DIAGNOSIS — Z9049 Acquired absence of other specified parts of digestive tract: Secondary | ICD-10-CM | POA: Diagnosis not present

## 2016-06-22 HISTORY — PX: BOWEL RESECTION: SHX1257

## 2016-06-22 HISTORY — PX: LYSIS OF ADHESION: SHX5961

## 2016-06-22 HISTORY — PX: LAPAROTOMY: SHX154

## 2016-06-22 LAB — URINALYSIS, ROUTINE W REFLEX MICROSCOPIC
Bilirubin Urine: NEGATIVE
Glucose, UA: NEGATIVE mg/dL
Ketones, ur: NEGATIVE mg/dL
NITRITE: NEGATIVE
PH: 7 (ref 5.0–8.0)
Protein, ur: NEGATIVE mg/dL
SPECIFIC GRAVITY, URINE: 1.015 (ref 1.005–1.030)

## 2016-06-22 LAB — CBC WITH DIFFERENTIAL/PLATELET
BASOS PCT: 0 %
Basophils Absolute: 0 10*3/uL (ref 0.0–0.1)
Eosinophils Absolute: 0 10*3/uL (ref 0.0–0.7)
Eosinophils Relative: 1 %
HEMATOCRIT: 39.2 % (ref 36.0–46.0)
HEMOGLOBIN: 13.2 g/dL (ref 12.0–15.0)
LYMPHS ABS: 0.9 10*3/uL (ref 0.7–4.0)
Lymphocytes Relative: 12 %
MCH: 30.6 pg (ref 26.0–34.0)
MCHC: 33.7 g/dL (ref 30.0–36.0)
MCV: 91 fL (ref 78.0–100.0)
Monocytes Absolute: 0.3 10*3/uL (ref 0.1–1.0)
Monocytes Relative: 4 %
NEUTROS ABS: 6.4 10*3/uL (ref 1.7–7.7)
NEUTROS PCT: 83 %
Platelets: 207 10*3/uL (ref 150–400)
RBC: 4.31 MIL/uL (ref 3.87–5.11)
RDW: 12.7 % (ref 11.5–15.5)
WBC: 7.8 10*3/uL (ref 4.0–10.5)

## 2016-06-22 LAB — URINALYSIS, MICROSCOPIC (REFLEX)

## 2016-06-22 LAB — BASIC METABOLIC PANEL
ANION GAP: 9 (ref 5–15)
BUN: 23 mg/dL — ABNORMAL HIGH (ref 6–20)
CHLORIDE: 100 mmol/L — AB (ref 101–111)
CO2: 25 mmol/L (ref 22–32)
Calcium: 9.3 mg/dL (ref 8.9–10.3)
Creatinine, Ser: 0.81 mg/dL (ref 0.44–1.00)
GFR calc non Af Amer: >60 mL/min (ref >60)
Glucose, Bld: 148 mg/dL — ABNORMAL HIGH (ref 65–99)
Potassium: 3.7 mmol/L (ref 3.5–5.1)
Sodium: 134 mmol/L — ABNORMAL LOW (ref 135–145)

## 2016-06-22 SURGERY — LAPAROTOMY, EXPLORATORY
Anesthesia: General | Site: Abdomen

## 2016-06-22 MED ORDER — SUCCINYLCHOLINE CHLORIDE 20 MG/ML IJ SOLN
INTRAMUSCULAR | Status: AC
  Filled 2016-06-22: qty 1

## 2016-06-22 MED ORDER — PROPOFOL 10 MG/ML IV BOLUS
INTRAVENOUS | Status: AC
  Filled 2016-06-22: qty 20

## 2016-06-22 MED ORDER — FENTANYL CITRATE (PF) 100 MCG/2ML IJ SOLN
INTRAMUSCULAR | Status: DC | PRN
  Administered 2016-06-22 (×2): 50 ug via INTRAVENOUS
  Administered 2016-06-22 (×3): 25 ug via INTRAVENOUS
  Administered 2016-06-22: 50 ug via INTRAVENOUS
  Administered 2016-06-22: 25 ug via INTRAVENOUS
  Administered 2016-06-23 (×7): 50 ug via INTRAVENOUS

## 2016-06-22 MED ORDER — MIDAZOLAM HCL 5 MG/5ML IJ SOLN
INTRAMUSCULAR | Status: DC | PRN
  Administered 2016-06-22: .5 mg via INTRAVENOUS
  Administered 2016-06-22: 1.5 mg via INTRAVENOUS
  Administered 2016-06-23: 50 mg via INTRAVENOUS

## 2016-06-22 MED ORDER — SODIUM CHLORIDE 0.9 % IV BOLUS (SEPSIS)
1000.0000 mL | Freq: Once | INTRAVENOUS | Status: AC
  Administered 2016-06-22: 1000 mL via INTRAVENOUS

## 2016-06-22 MED ORDER — FENTANYL CITRATE (PF) 100 MCG/2ML IJ SOLN
100.0000 ug | Freq: Once | INTRAMUSCULAR | Status: AC
  Administered 2016-06-22: 100 ug via INTRAVENOUS
  Filled 2016-06-22: qty 2

## 2016-06-22 MED ORDER — HYDROMORPHONE HCL 1 MG/ML IJ SOLN
0.5000 mg | Freq: Once | INTRAMUSCULAR | Status: AC
  Administered 2016-06-22: 0.5 mg via INTRAVENOUS
  Filled 2016-06-22: qty 1

## 2016-06-22 MED ORDER — LACTATED RINGERS IV SOLN
INTRAVENOUS | Status: DC | PRN
  Administered 2016-06-22 – 2016-06-23 (×4): via INTRAVENOUS

## 2016-06-22 MED ORDER — MIDAZOLAM HCL 2 MG/2ML IJ SOLN
INTRAMUSCULAR | Status: AC
  Filled 2016-06-22: qty 2

## 2016-06-22 MED ORDER — ONDANSETRON HCL 4 MG/2ML IJ SOLN
4.0000 mg | Freq: Once | INTRAMUSCULAR | Status: AC
  Administered 2016-06-22: 4 mg via INTRAVENOUS
  Filled 2016-06-22: qty 2

## 2016-06-22 MED ORDER — SUCCINYLCHOLINE 20MG/ML (10ML) SYRINGE FOR MEDFUSION PUMP - OPTIME
INTRAMUSCULAR | Status: DC | PRN
  Administered 2016-06-22: 80 mg via INTRAVENOUS

## 2016-06-22 MED ORDER — LIDOCAINE HCL (CARDIAC) 10 MG/ML IV SOLN
INTRAVENOUS | Status: DC | PRN
  Administered 2016-06-22: 40 mg via INTRAVENOUS

## 2016-06-22 MED ORDER — EPHEDRINE SULFATE 50 MG/ML IJ SOLN
INTRAMUSCULAR | Status: AC
  Filled 2016-06-22: qty 1

## 2016-06-22 MED ORDER — FENTANYL CITRATE (PF) 250 MCG/5ML IJ SOLN
INTRAMUSCULAR | Status: AC
  Filled 2016-06-22: qty 5

## 2016-06-22 MED ORDER — PHENYLEPHRINE HCL 10 MG/ML IJ SOLN
INTRAMUSCULAR | Status: DC | PRN
  Administered 2016-06-22 (×2): 80 ug via INTRAVENOUS

## 2016-06-22 MED ORDER — DEXTROSE 5 % IV SOLN
2.0000 g | INTRAVENOUS | Status: AC
  Administered 2016-06-22: 2 g via INTRAVENOUS
  Filled 2016-06-22: qty 2

## 2016-06-22 MED ORDER — BUPIVACAINE LIPOSOME 1.3 % IJ SUSP
INTRAMUSCULAR | Status: AC
  Filled 2016-06-22: qty 20

## 2016-06-22 MED ORDER — ROCURONIUM BROMIDE 50 MG/5ML IV SOLN
INTRAVENOUS | Status: AC
  Filled 2016-06-22: qty 1

## 2016-06-22 MED ORDER — ROCURONIUM 10MG/ML (10ML) SYRINGE FOR MEDFUSION PUMP - OPTIME
INTRAVENOUS | Status: DC | PRN
  Administered 2016-06-22: 25 mg via INTRAVENOUS
  Administered 2016-06-22 – 2016-06-23 (×2): 5 mg via INTRAVENOUS
  Administered 2016-06-23: 10 mg via INTRAVENOUS

## 2016-06-22 MED ORDER — CEFOTETAN DISODIUM-DEXTROSE 2-2.08 GM-% IV SOLR
INTRAVENOUS | Status: AC
Start: 2016-06-22 — End: 2016-06-22
  Filled 2016-06-22: qty 50

## 2016-06-22 MED ORDER — CHLORHEXIDINE GLUCONATE CLOTH 2 % EX PADS: 6.0000 | MEDICATED_PAD | Freq: Once | CUTANEOUS | Status: DC

## 2016-06-22 MED ORDER — SODIUM CHLORIDE 0.9 % IR SOLN
Status: DC | PRN
  Administered 2016-06-22: 3000 mL

## 2016-06-22 MED ORDER — PROPOFOL 10 MG/ML IV BOLUS
INTRAVENOUS | Status: DC | PRN
  Administered 2016-06-22: 100 mg via INTRAVENOUS

## 2016-06-22 MED ORDER — LIDOCAINE HCL (PF) 1 % IJ SOLN
INTRAMUSCULAR | Status: AC
  Filled 2016-06-22: qty 5

## 2016-06-22 MED ORDER — SODIUM CHLORIDE 0.9 % IJ SOLN
INTRAMUSCULAR | Status: AC
  Filled 2016-06-22: qty 10

## 2016-06-22 MED ORDER — MORPHINE SULFATE (PF) 4 MG/ML IV SOLN
4.0000 mg | Freq: Once | INTRAVENOUS | Status: AC
  Administered 2016-06-22: 4 mg via INTRAVENOUS
  Filled 2016-06-22: qty 1

## 2016-06-22 MED ORDER — ONDANSETRON HCL 4 MG/2ML IJ SOLN
INTRAMUSCULAR | Status: DC | PRN
  Administered 2016-06-22: 4 mg via INTRAVENOUS

## 2016-06-22 SURGICAL SUPPLY — 88 items
APPLIER CLIP 11 MED OPEN (CLIP)
APPLIER CLIP 13 LRG OPEN (CLIP) ×3
BAG HAMPER (MISCELLANEOUS) ×3 IMPLANT
BARRIER SKIN 2 3/4 (OSTOMY) IMPLANT
BARRIER SKIN 2 3/4 INCH (OSTOMY)
BLADE SURG SZ10 CARB STEEL (BLADE) ×3 IMPLANT
CELLS DAT CNTRL 66122 CELL SVR (MISCELLANEOUS) IMPLANT
CHLORAPREP W/TINT 26ML (MISCELLANEOUS) ×6 IMPLANT
CLAMP POUCH DRAINAGE QUIET (OSTOMY) IMPLANT
CLIP APPLIE 11 MED OPEN (CLIP) IMPLANT
CLIP APPLIE 13 LRG OPEN (CLIP) ×1 IMPLANT
CLOTH BEACON ORANGE TIMEOUT ST (SAFETY) ×3 IMPLANT
COVER LIGHT HANDLE STERIS (MISCELLANEOUS) ×6 IMPLANT
COVER MAYO STAND XLG (DRAPE) IMPLANT
DRAPE PROXIMA HALF (DRAPES) IMPLANT
DRAPE WARM FLUID 44X44 (DRAPE) ×3 IMPLANT
DRSG OPSITE POSTOP 4X10 (GAUZE/BANDAGES/DRESSINGS) IMPLANT
DRSG OPSITE POSTOP 4X8 (GAUZE/BANDAGES/DRESSINGS) ×6 IMPLANT
ELECT BLADE 6 FLAT ULTRCLN (ELECTRODE) ×3 IMPLANT
ELECT REM PT RETURN 9FT ADLT (ELECTROSURGICAL) ×6
ELECTRODE REM PT RTRN 9FT ADLT (ELECTROSURGICAL) ×2 IMPLANT
EVACUATOR DRAINAGE 10X20 100CC (DRAIN) IMPLANT
EVACUATOR SILICONE 100CC (DRAIN)
GAUZE SPONGE 4X4 12PLY STRL (GAUZE/BANDAGES/DRESSINGS) IMPLANT
GAUZE SPONGE 4X4 16PLY XRAY LF (GAUZE/BANDAGES/DRESSINGS) ×3 IMPLANT
GLOVE BIOGEL PI IND STRL 7.5 (GLOVE) ×1 IMPLANT
GLOVE BIOGEL PI INDICATOR 7.5 (GLOVE) ×2
GLOVE ECLIPSE 7.0 STRL STRAW (GLOVE) ×6 IMPLANT
GOWN STRL REUS W/TWL LRG LVL3 (GOWN DISPOSABLE) ×9 IMPLANT
HANDLE SUCTION POOLE (INSTRUMENTS) ×1 IMPLANT
INST SET MAJOR GENERAL (KITS) ×3 IMPLANT
KIT ROOM TURNOVER APOR (KITS) ×3 IMPLANT
MANIFOLD NEPTUNE II (INSTRUMENTS) ×3 IMPLANT
NEEDLE HYPO 21X1.5 SAFETY (NEEDLE) ×3 IMPLANT
NS IRRIG 1000ML POUR BTL (IV SOLUTION) ×9 IMPLANT
PACK ABDOMINAL MAJOR (CUSTOM PROCEDURE TRAY) ×3 IMPLANT
PACK PERI GYN (CUSTOM PROCEDURE TRAY) IMPLANT
PAD ABD 5X9 TENDERSORB (GAUZE/BANDAGES/DRESSINGS) IMPLANT
PAD ARMBOARD 7.5X6 YLW CONV (MISCELLANEOUS) ×3 IMPLANT
PENCIL HANDSWITCHING (ELECTRODE) ×3 IMPLANT
POUCH OSTOMY 2 3/4  H 3804 (WOUND CARE)
POUCH OSTOMY 2 PC DRNBL 2.75 (WOUND CARE) IMPLANT
RELOAD LINEAR CUT PROX 55 BLUE (ENDOMECHANICALS) ×9 IMPLANT
RELOAD PROXIMATE 75MM BLUE (ENDOMECHANICALS) IMPLANT
RELOAD STAPLER LINEAR PROX 30 (STAPLE) IMPLANT
RETRACTOR WND ALEXIS 25 LRG (MISCELLANEOUS) IMPLANT
RETRACTOR WOUND ALXS 34CM XLRG (MISCELLANEOUS) IMPLANT
RTRCTR WOUND ALEXIS 18CM MED (MISCELLANEOUS)
RTRCTR WOUND ALEXIS 25CM LRG (MISCELLANEOUS)
RTRCTR WOUND ALEXIS 34CM XLRG (MISCELLANEOUS)
SEALER TISSUE G2 CVD JAW 35 (ENDOMECHANICALS) ×1 IMPLANT
SEALER TISSUE G2 CVD JAW 45CM (ENDOMECHANICALS) ×2
SEALER TISSUE X1 CVD JAW (INSTRUMENTS) ×3 IMPLANT
SET BASIN LINEN APH (SET/KITS/TRAYS/PACK) ×3 IMPLANT
SPONGE INTESTINAL PEANUT (DISPOSABLE) IMPLANT
SPONGE LAP 18X18 X RAY DECT (DISPOSABLE) ×6 IMPLANT
STAPLER CIRC CVD 29MM 37CM (STAPLE) IMPLANT
STAPLER GUN LINEAR PROX 60 (STAPLE) IMPLANT
STAPLER PROXIMATE 55 BLUE (STAPLE) ×3 IMPLANT
STAPLER PROXIMATE 75MM BLUE (STAPLE) IMPLANT
STAPLER RELOAD LINEAR PROX 30 (STAPLE)
STAPLER SYS INTERNAL RELOAD SS (MISCELLANEOUS) IMPLANT
STAPLER VISISTAT (STAPLE) IMPLANT
STAPLER VISISTAT 35W (STAPLE) ×3 IMPLANT
SUCTION POOLE HANDLE (INSTRUMENTS) ×3
SUT CHROMIC 0 SH (SUTURE) IMPLANT
SUT CHROMIC 2 0 SH (SUTURE) IMPLANT
SUT CHROMIC 3 0 SH 27 (SUTURE) IMPLANT
SUT ETHILON 3 0 FSL (SUTURE) IMPLANT
SUT PDS AB 0 CTX 60 (SUTURE) ×6 IMPLANT
SUT PROLENE 3 0 PS 2 (SUTURE) IMPLANT
SUT SILK 0 FSL (SUTURE) IMPLANT
SUT SILK 2 0 (SUTURE) ×2
SUT SILK 2 0 REEL (SUTURE) IMPLANT
SUT SILK 2-0 18XBRD TIE 12 (SUTURE) ×1 IMPLANT
SUT SILK 3 0 SH CR/8 (SUTURE) ×3 IMPLANT
SUT VIC AB 0 CT1 18XCR BRD 8 (SUTURE) IMPLANT
SUT VIC AB 0 CT1 8-18 (SUTURE)
SUT VIC AB 3-0 SH 27 (SUTURE) ×2
SUT VIC AB 3-0 SH 27X BRD (SUTURE) ×1 IMPLANT
SUT VICRYL AB 2 0 TIES (SUTURE) IMPLANT
SUT VICRYL AB 3 0 TIES (SUTURE) IMPLANT
SYR 20CC LL (SYRINGE) ×3 IMPLANT
SYR CONTROL 10ML LL (SYRINGE) ×3 IMPLANT
TOWEL BLUE STERILE X RAY DET (MISCELLANEOUS) ×3 IMPLANT
TRAY FOLEY BAG SILVER LF 16FR (CATHETERS) IMPLANT
TRAY FOLEY CATH 16FR SILVER (SET/KITS/TRAYS/PACK) IMPLANT
YANKAUER SUCT BULB TIP 10FT TU (MISCELLANEOUS) ×3 IMPLANT

## 2016-06-22 NOTE — Anesthesia Procedure Notes (Signed)
Procedure Name: Intubation Date/Time: 06/22/2016 11:03 PM Performed by: Tressie Stalker E Pre-anesthesia Checklist: Patient identified, Patient being monitored, Timeout performed, Emergency Drugs available and Suction available Patient Re-evaluated:Patient Re-evaluated prior to inductionOxygen Delivery Method: Circle system utilized Preoxygenation: Pre-oxygenation with 100% oxygen Intubation Type: IV induction, Rapid sequence and Cricoid Pressure applied Ventilation: Mask ventilation without difficulty Laryngoscope Size: Miller and 2 Grade View: Grade I Tube type: Oral Tube size: 7.0 mm Number of attempts: 1 Airway Equipment and Method: Stylet Placement Confirmation: ETT inserted through vocal cords under direct vision,  positive ETCO2 and breath sounds checked- equal and bilateral Secured at: 20 cm Tube secured with: Tape Dental Injury: Teeth and Oropharynx as per pre-operative assessment

## 2016-06-22 NOTE — ED Triage Notes (Signed)
Started with several abdominal pain this am.  Rates pain 10/10.  Pain mostly on the right side and shooting to back.

## 2016-06-22 NOTE — H&P (Addendum)
SURGICAL HISTORY AND PHYSICAL  HISTORY OF PRESENT ILLNESS (HPI):  66 y.o. Osborne presented with acute onset of Right-sided abdominal pain 10.5 hours ago, ~11 am this morning, at which time she also ceased passing flatus despite a normal BM early this morning and has since experienced nausea associated with the pain. Patient denies any recent diarrhea, prior similar episodes, fever/chills, CP, or SOB. Since insertion of NG tube, only ~50 mL of clear fluid has drained from the NG tube and patient's pain described as 10/10 at time of presentation has not improved.  Surgery is consulted by ED physician Dr. Alvino Chapel in this context for evaluation and management of closed loop small bowel obstruction.  PAST MEDICAL HISTORY (PMH):      Past Medical History:  Diagnosis Date  . Allergy   . Cancer (HCC)    cervical cancer cells  . GERD (gastroesophageal reflux disease)   . Osteopenia      PAST SURGICAL HISTORY ([place]):       Past Surgical History:  Procedure Laterality Date  . ABDOMINAL HYSTERECTOMY    . CHOLECYSTECTOMY  2005  . FRACTURE SURGERY     rt leg     MEDICATIONS:         Prior to Admission medications   Medication Sig Start Date End Date Taking? Authorizing Provider  aspirin EC 81 MG tablet Take 81 mg by mouth every morning.   Yes Historical Provider, MD  ibuprofen (ADVIL,MOTRIN) 200 MG tablet Take 600 mg by mouth every 6 (six) hours as needed for pain.   Yes Historical Provider, MD  omeprazole (PRILOSEC) 20 MG capsule Take 20 mg by mouth every morning.   Yes Historical Provider, MD     ALLERGIES:      Allergies  Allergen Reactions  . Codeine Itching     SOCIAL HISTORY:  Social History        Social History  . Marital status: Divorced    Spouse name: N/A  . Number of children: N/A  . Years of education: N/A      Occupational History  . Not on file.   Social History Main Topics  . Smoking status: Never  Smoker  . Smokeless tobacco: Never Used  . Alcohol use No  . Drug use: No  . Sexual activity: Not Currently       Other Topics Concern  . Not on file      Social History Narrative  . No narrative on file    The patient currently resides (home / rehab facility / nursing home): Home  The patient normally is (ambulatory / bedbound): Ambulatory   FAMILY HISTORY:        Family History  Problem Relation Age of Onset  . COPD Mother   . Heart disease Mother   . Miscarriages / [place] Mother   . Vision loss Mother     from shingles  . COPD Father   . Cancer Brother     leukemia  . Early death Brother     REVIEW OF SYSTEMS:  Constitutional: denies weight loss, fever, chills, or sweats  Eyes: denies any other vision changes, history of eye injury  ENT: denies sore throat, hearing problems  Respiratory: denies shortness of breath, wheezing  Cardiovascular: denies chest pain, palpitations  Gastrointestinal: abdominal pain, N/V, and bowel function as per HPI Genitourinary: denies burning with urination or urinary frequency Musculoskeletal: denies any other joint pains or cramps  Skin: denies any other rashes or skin discolorations  Neurological: denies any other headache, dizziness, weakness  Psychiatric: denies any other depression, anxiety   All other review of systems were negative   VITAL SIGNS:  Temp:  [97.9 F (36.6 C)] 97.9 F (36.6 C) (01/17 1542) Pulse Rate:  [57-68] 68 (01/17 1949) Resp:  [17-20] 17 (01/17 1949) BP: (156-160)/(68-90) 158/75 (01/17 1949) SpO2:  [97 %-100 %] 99 % (01/17 1949) Weight:  [49.9 kg (110 lb)] 49.9 kg (110 lb) (01/17 1542)     Height: 5' 1"  (154.9 cm) Weight: 49.9 kg (110 lb) BMI (Calculated): 20.8   INTAKE/OUTPUT:  This shift: No intake/output data recorded.  Last 2 shifts: @IOLAST2SHIFTS @   PHYSICAL EXAM:  Constitutional:  -- Normal body habitus  -- Awake, alert, and oriented x3  Eyes:  -- Pupils equally  round and reactive to light  -- No scleral icterus  Ear, nose, and throat:  -- No jugular venous distension  Pulmonary:  -- No crackles  -- Equal breath sounds bilaterally -- Breathing non-labored at rest Cardiovascular:  -- S1, S2 present  -- No pericardial rubs Gastrointestinal:  -- Abdomen soft, moderate abdominal tenderness focused at the Right-side of patient's lower abdomen with mild-/moderate- lower abdominal distention, no guarding/rebound -- No abdominal masses appreciated, pulsatile or otherwise  Musculoskeletal and Integumentary:  -- Wounds or skin discoloration: None appreciated -- Extremities: B/L UE and LE FROM, hands and feet warm, no edema  Neurologic:  -- Motor function: intact and symmetric -- Sensation: intact and symmetric  Labs:  CBC:  Labs(Brief)       Lab Results  Component Value Date   WBC 7.8 06/22/2016   RBC 4.31 06/22/2016     BMP:  Labs(Brief)       Lab Results  Component Value Date   GLUCOSE 148 (H) 06/22/2016   CO2 25 06/22/2016   BUN 23 (H) 06/22/2016   CREATININE 0.81 06/22/2016   CREATININE 0.80 07/14/2015   CALCIUM 9.3 06/22/2016       Imaging studies:  CT Abdomen and Pelvis without PO or IV Contrast (06/23/2015) - personally reviewed and reviewed bedside with patient, her daughter, and her daughter's fiance Stomach is nondistended. No gastric wall thickening. No evidence of outlet obstruction. Duodenum is normally positioned as is the ligament of Treitz. Proximal small bowel is unremarkable. Loops of distal small bowel in the central pelvis measure up to 3.3 cm in diameter, appearing focally dilated. There is prominent edema within the adjacent mesentery and fluid is identified in the mesentery and cul-de-sac around these loops. Two separate small bowel transitions zones are identified in the mid pelvis, immediately anterior to the sacrum (see image 52 series 2 on axial imaging and also well demonstrated on coronal  images 37 and 38 of series 5). This cluster of abnormal appearing small bowel appears to displace adjacent colon and bladder. The terminal ileum is normal. The appendix is not visualized, but there is no edema or inflammation in the region of the cecum. No gross colonic mass. No colonic wall thickening. No substantial diverticular change.  Imaging features are highly suspicious for either a closed  loop obstruction or incarcerated internal hernia.  Assessment/Plan: (ICD-10's: K35.52) 66 y.o. Osborne with a closed loop small bowel obstruction vs pelvic internal hernia attributable to remote history of hysterectomy for cervical cancer performed at [facility] without prior recognized bowel obstructions, complicated by pertinent comorbidities including history of cervical cancer, chronic constipation, chronic urinary retention/pelvic floor laxity relieved with self-applied pelvic pressure, and GERD.              -  NPO, IVF, NG tube             - patient requires urgent exploratory laparotomy with adhesiolysis and possible small bowel resection              - all risks, benefits, and alternatives to above procedure(s) were discussed with the patient and her family, who elect(s) to proceed, all of their questions were answered to their expressed satisfaction, and informed consent was obtained             - DVT prophylaxis  All of the above findings and recommendations were discussed with the patient, her family, and ED physician, and all of patient's and her family's questions were answered to their expressed satisfaction.  Thank you for the opportunity to participate in this patient's care.   -- Marilynne Drivers Rosana Hoes, MD, [place]: [facility] Office: [phone]

## 2016-06-22 NOTE — ED Provider Notes (Signed)
[facility] DEPT Provider Note   CSN: [id number] Arrival date & time: 06/22/16  1530     History   Chief Complaint Chief Complaint  Patient presents with  . Abdominal Pain    HPI Rebecca Osborne is a 66 y.o. female.  HPI Patient presents with abdominal pain. Right lower quadrant. Began somewhat suddenly at around 11:00 today. No fevers but has had some chills. Has had nausea and vomiting without diarrhea. Pain is severe and goes to the back. States she is not hungry. She has not had pain like this before. No dysuria. No vaginal bleeding or discharge. She's had a previous total hysterectomy. No sick contacts.   Past Medical History:  Diagnosis Date  . Allergy   . Cancer (HCC)    cervical cancer cells  . GERD (gastroesophageal reflux disease)   . Osteopenia     Patient Active Problem List   Diagnosis Date Noted  . GERD 08/31/2009  . DYSPHAGIA UNSPECIFIED 08/31/2009    Past Surgical History:  Procedure Laterality Date  . ABDOMINAL HYSTERECTOMY    . CHOLECYSTECTOMY  2005  . FRACTURE SURGERY     rt leg    OB History    Gravida Para Term Preterm AB Living             3   SAB TAB Ectopic Multiple Live Births                   Home Medications    Prior to Admission medications   Medication Sig Start Date End Date Taking? Authorizing Provider  aspirin EC 81 MG tablet Take 81 mg by mouth every morning.   Yes Historical Provider, MD  ibuprofen (ADVIL,MOTRIN) 200 MG tablet Take 600 mg by mouth every 6 (six) hours as needed for pain.   Yes Historical Provider, MD  omeprazole (PRILOSEC) 20 MG capsule Take 20 mg by mouth every morning.   Yes Historical Provider, MD    Family History Family History  Problem Relation Age of Onset  . COPD Mother   . Heart disease Mother   . Miscarriages / [place] Mother   . Vision loss Mother     from shingles  . COPD Father   . Cancer Brother     leukemia  . Early death Brother     Social History Social History    Substance Use Topics  . Smoking status: Never Smoker  . Smokeless tobacco: Never Used  . Alcohol use No     Allergies   Codeine   Review of Systems Review of Systems  Constitutional: Positive for chills. Negative for appetite change and fever.  HENT: Negative for congestion.   Eyes: Negative for pain.  Respiratory: Negative for shortness of breath.   Cardiovascular: Negative for chest pain.  Gastrointestinal: Positive for abdominal pain, nausea and vomiting.  Genitourinary: Negative for dysuria.  Musculoskeletal: Positive for back pain.  Skin: Negative for color change and wound.  Neurological: Negative for syncope.  Hematological: Negative for adenopathy.     Physical Exam Updated Vital Signs BP 156/68 (BP Location: Right Arm)   Pulse (!) 57   Temp 97.9 F (36.6 C) (Tympanic)   Resp 20   Ht 5' 1"  (1.549 m)   Wt 110 lb (49.9 kg)   SpO2 97%   BMI 20.78 kg/m   Physical Exam  Constitutional: She appears well-developed.  HENT:  Head: Atraumatic.  Neck: Neck supple.  Cardiovascular: Normal rate.   Pulmonary/Chest: Effort normal.  Abdominal:  Moderate right lower quadrant tenderness. No mass.  Genitourinary:  Genitourinary Comments: No CVA tenderness  Musculoskeletal: She exhibits no edema.  Skin: Skin is warm. Capillary refill takes less than 2 seconds.     ED Treatments / Results  Labs (all labs ordered are listed, but only abnormal results are displayed) Labs Reviewed  BASIC METABOLIC PANEL - Abnormal; Notable for the following:       Result Value   Sodium 134 (*)    Chloride 100 (*)    Glucose, Bld 148 (*)    BUN 23 (*)    All other components within normal limits  URINALYSIS, ROUTINE W REFLEX MICROSCOPIC - Abnormal; Notable for the following:    Hgb urine dipstick TRACE (*)    Leukocytes, UA TRACE (*)    All other components within normal limits  URINALYSIS, MICROSCOPIC (REFLEX) - Abnormal; Notable for the following:    Bacteria, UA FEW (*)     Squamous Epithelial / LPF 6-30 (*)    All other components within normal limits  CBC WITH DIFFERENTIAL/PLATELET    EKG  EKG Interpretation None       Radiology Ct Renal Stone Study  Result Date: 06/22/2016 CLINICAL DATA:  Right side abdominal pain EXAM: CT ABDOMEN AND PELVIS WITHOUT CONTRAST TECHNIQUE: Multidetector CT imaging of the abdomen and pelvis was performed following the standard protocol without IV contrast. COMPARISON:  None. FINDINGS: Lower chest:  Unremarkable. Hepatobiliary: No focal abnormality in the liver on this study without intravenous contrast. Gallbladder surgically absent. Common bile duct measure 9 mm diameter, likely related to cholecystectomy. Pancreas: No focal mass lesion. No dilatation of the main duct. No intraparenchymal cyst. No peripancreatic edema. Spleen: No splenomegaly. No focal mass lesion. Adrenals/Urinary Tract: Adrenal gland thickening noted without a discrete nodule. 2 mm nonobstructing interpolar right renal stone. 7 mm interpolar right renal lesion approaches water attenuation and is likely a cyst. Focal cortical scarring noted interpolar right kidney. Areas of focal cortical scarring are seen in the left kidney. No hydronephrosis on either side. No evidence for ureteral stone or hydroureter The urinary bladder appears normal for the degree of distention. Stomach/Bowel: Stomach is nondistended. No gastric wall thickening. No evidence of outlet obstruction. Duodenum is normally positioned as is the ligament of Treitz. Proximal small bowel is unremarkable. Loops of distal small bowel in the central pelvis measure up to 3.3 cm in diameter, appearing focally dilated. There is prominent edema within the adjacent mesentery and fluid is identified in the mesentery and cul-de-sac around these loops. Two separate small bowel transitions zones are identified in the mid pelvis, immediately anterior to the sacrum (see image 52 series 2 on axial imaging and also well  demonstrated on coronal images 37 and 38 of series 5). This cluster of abnormal appearing small bowel appears to displace adjacent colon and bladder. The terminal ileum is normal. The appendix is not visualized, but there is no edema or inflammation in the region of the cecum. No gross colonic mass. No colonic wall thickening. No substantial diverticular change. Vascular/Lymphatic: There is abdominal aortic atherosclerosis without aneurysm. There is no gastrohepatic or hepatoduodenal ligament lymphadenopathy. No intraperitoneal or retroperitoneal lymphadenopathy. No pelvic sidewall lymphadenopathy. Reproductive: Uterus is surgically absent. There is no adnexal mass. Musculoskeletal: Pelvic floor laxity is evident. Bone windows reveal no worrisome lytic or sclerotic osseous lesions. IMPRESSION: 1. A cluster of dilated small bowel is identified in the central anatomic pelvis. The mesentery servings bowel is congested and edematous and  there is interloop mesenteric and surrounding cul-de-sac fluid associated with these loops. Together, conglomeration of abnormal small bowel loops displaces colon and bladder, generating mass-effect. At the cranial margin of this small bowel collection, 2 separate small bowel transition zones are identified with dilated small bowel leading into 1 of the 2 transition zones. Imaging features are highly suspicious for either a closed loop obstruction or incarcerated internal hernia. 2. 2 mm nonobstructing right renal stone. 3. Probable 7 mm interpolar right renal cyst. 4. Mildly dilated common bile duct may be related to cholecystectomy. Correlation with liver function test may prove helpful. These results were called by me at the time of interpretation on 06/22/2016 at 7:02 pm to Dr. Davonna Belling , who verbally acknowledged these results. Electronically Signed   By: Misty Stanley M.D.   On: 06/22/2016 19:02    Procedures Procedures (including critical care time)  Medications  Ordered in ED Medications  HYDROmorphone (DILAUDID) injection 0.5 mg (not administered)  sodium chloride 0.9 % bolus 1,000 mL (0 mLs Intravenous Stopped 06/22/16 1650)  ondansetron (ZOFRAN) injection 4 mg (4 mg Intravenous Given 06/22/16 1612)  fentaNYL (SUBLIMAZE) injection 100 mcg (100 mcg Intravenous Given 06/22/16 1613)  morphine 4 MG/ML injection 4 mg (4 mg Intravenous Given 06/22/16 1725)  HYDROmorphone (DILAUDID) injection 0.5 mg (0.5 mg Intravenous Given 06/22/16 1912)     Initial Impression / Assessment and Plan / ED Course  I have reviewed the triage vital signs and the nursing notes.  Pertinent labs & imaging results that were available during my care of the patient were reviewed by me and considered in my medical decision making (see chart for details).  Clinical Course     Patient with abdominal pain nausea and vomiting. Began earlier today. Labs reassuring overall but CT scan done due to tenderness. Noncontrast due to blood in the urine and normal white count. Also acute onset. Found to have small bowel obstruction. Closed loop versus internal hernia. Continued pain. On reexamination patient's abdomen is more tender and now has fullness in her lower abdomen/pelvic area. Will discuss with general surgery.  Discussed with general surgery. Dr. Rosana Hoes initially stated NG tube and will see in the morning then he reviewed the CT and was a little more worried. He'll come in and determine disposition.  Final Clinical Impressions(s) / ED Diagnoses   Final diagnoses:  Intestinal obstruction, unspecified cause, unspecified whether partial or complete    New Prescriptions New Prescriptions   No medications on file     Davonna Belling, MD 06/23/16 0020

## 2016-06-22 NOTE — ED Notes (Addendum)
Pt's daughter Lynelle Smoke requests to be reached at this number if she leaves during the night. [phone].

## 2016-06-22 NOTE — Consult Note (Addendum)
SURGICAL CONSULTATION NOTE (initial) - cpt: 99254  HISTORY OF PRESENT ILLNESS (HPI):  66 y.o. female presented with acute onset of Right-sided abdominal pain 10.5 hours ago, ~11 am this morning, at which time she also ceased passing flatus despite a normal BM early this morning and has since experienced nausea associated with the pain. Patient denies any recent diarrhea, prior similar episodes, fever/chills, CP, or SOB. Since insertion of NG tube, only ~50 mL of clear fluid has drained from the NG tube and patient's pain described as 10/10 at time of presentation has not improved.  Surgery is consulted by ED physician Dr. Alvino Chapel in this context for evaluation and management of closed loop small bowel obstruction.  PAST MEDICAL HISTORY (PMH):  Past Medical History:  Diagnosis Date  . Allergy   . Cancer (HCC)    cervical cancer cells  . GERD (gastroesophageal reflux disease)   . Osteopenia      PAST SURGICAL HISTORY ([facility]):  Past Surgical History:  Procedure Laterality Date  . ABDOMINAL HYSTERECTOMY    . CHOLECYSTECTOMY  2005  . FRACTURE SURGERY     rt leg     MEDICATIONS:  Prior to Admission medications   Medication Sig Start Date End Date Taking? Authorizing Provider  aspirin EC 81 MG tablet Take 81 mg by mouth every morning.   Yes Historical Provider, MD  ibuprofen (ADVIL,MOTRIN) 200 MG tablet Take 600 mg by mouth every 6 (six) hours as needed for pain.   Yes Historical Provider, MD  omeprazole (PRILOSEC) 20 MG capsule Take 20 mg by mouth every morning.   Yes Historical Provider, MD     ALLERGIES:  Allergies  Allergen Reactions  . Codeine Itching     SOCIAL HISTORY:  Social History   Social History  . Marital status: Divorced    Spouse name: N/A  . Number of children: N/A  . Years of education: N/A   Occupational History  . Not on file.   Social History Main Topics  . Smoking status: Never Smoker  . Smokeless tobacco: Never Used  . Alcohol use No  . Drug  use: No  . Sexual activity: Not Currently   Other Topics Concern  . Not on file   Social History Narrative  . No narrative on file    The patient currently resides (home / rehab facility / nursing home): Home  The patient normally is (ambulatory / bedbound): Ambulatory   FAMILY HISTORY:  Family History  Problem Relation Age of Onset  . COPD Mother   . Heart disease Mother   . Miscarriages / [place] Mother   . Vision loss Mother     from shingles  . COPD Father   . Cancer Brother     leukemia  . Early death Brother     REVIEW OF SYSTEMS:  Constitutional: denies weight loss, fever, chills, or sweats  Eyes: denies any other vision changes, history of eye injury  ENT: denies sore throat, hearing problems  Respiratory: denies shortness of breath, wheezing  Cardiovascular: denies chest pain, palpitations  Gastrointestinal: abdominal pain, N/V, and bowel function as per HPI Genitourinary: denies burning with urination or urinary frequency Musculoskeletal: denies any other joint pains or cramps  Skin: denies any other rashes or skin discolorations  Neurological: denies any other headache, dizziness, weakness  Psychiatric: denies any other depression, anxiety   All other review of systems were negative   VITAL SIGNS:  Temp:  [97.9 F (36.6 C)] 97.9 F (36.6 C) (  01/17 1542) Pulse Rate:  [57-68] 68 (01/17 1949) Resp:  [17-20] 17 (01/17 1949) BP: (156-160)/(68-90) 158/75 (01/17 1949) SpO2:  [97 %-100 %] 99 % (01/17 1949) Weight:  [49.9 kg (110 lb)] 49.9 kg (110 lb) (01/17 1542)     Height: 5' 1"  (154.9 cm) Weight: 49.9 kg (110 lb) BMI (Calculated): 20.8   INTAKE/OUTPUT:  This shift: No intake/output data recorded.  Last 2 shifts: @IOLAST2SHIFTS @   PHYSICAL EXAM:  Constitutional:  -- Normal body habitus  -- Awake, alert, and oriented x3  Eyes:  -- Pupils equally round and reactive to light  -- No scleral icterus  Ear, nose, and throat:  -- No jugular venous  distension  Pulmonary:  -- No crackles  -- Equal breath sounds bilaterally -- Breathing non-labored at rest Cardiovascular:  -- S1, S2 present  -- No pericardial rubs Gastrointestinal:  -- Abdomen soft, moderate abdominal tenderness focused at the Right-side of patient's lower abdomen with mild-/moderate- lower abdominal distention, no guarding/rebound -- No abdominal masses appreciated, pulsatile or otherwise  Musculoskeletal and Integumentary:  -- Wounds or skin discoloration: None appreciated -- Extremities: B/L UE and LE FROM, hands and feet warm, no edema  Neurologic:  -- Motor function: intact and symmetric -- Sensation: intact and symmetric  Labs:  CBC:  Lab Results  Component Value Date   WBC 7.8 06/22/2016   RBC 4.31 06/22/2016   BMP:  Lab Results  Component Value Date   GLUCOSE 148 (H) 06/22/2016   CO2 25 06/22/2016   BUN 23 (H) 06/22/2016   CREATININE 0.81 06/22/2016   CREATININE 0.80 07/14/2015   CALCIUM 9.3 06/22/2016     Imaging studies:  CT Abdomen and Pelvis without PO or IV Contrast (06/23/2015) - personally reviewed and reviewed bedside with patient, her daughter, and her daughter's fiance Stomach is nondistended. No gastric wall thickening. No evidence of outlet obstruction. Duodenum is normally positioned as is the ligament of Treitz. Proximal small bowel is unremarkable. Loops of distal small bowel in the central pelvis measure up to 3.3 cm in diameter, appearing focally dilated. There is prominent edema within the adjacent mesentery and fluid is identified in the mesentery and cul-de-sac around these loops. Two separate small bowel transitions zones are identified in the mid pelvis, immediately anterior to the sacrum (see image 52 series 2 on axial imaging and also well demonstrated on coronal images 37 and 38 of series 5). This cluster of abnormal appearing small bowel appears to displace adjacent colon and bladder. The terminal ileum is  normal. The appendix is not visualized, but there is no edema or inflammation in the region of the cecum. No gross colonic mass. No colonic wall thickening. No substantial diverticular change.  Imaging features are highly suspicious for either a closed  loop obstruction or incarcerated internal hernia.  Assessment/Plan: (ICD-10's: K64.52) 66 y.o. female with a closed loop small bowel obstruction vs pelvic internal hernia attributable to remote history of hysterectomy for cervical cancer performed at [facility] without prior recognized bowel obstructions, complicated by pertinent comorbidities including history of cervical cancer, chronic constipation, chronic urinary retention/pelvic floor laxity relieved with self-applied pelvic pressure, and GERD.   - NPO, IVF, NG tube  - patient requires urgent exploratory laparotomy with adhesiolysis and possible small bowel resection   - all risks, benefits, and alternatives to above procedure(s) were discussed with the patient and her family, who elect(s) to proceed, all of their questions were answered to their expressed satisfaction, and informed consent was  obtained  - DVT prophylaxis  All of the above findings and recommendations were discussed with the patient, her family, and ED physician, and all of patient's and her family's questions were answered to their expressed satisfaction.  Thank you for the opportunity to participate in this patient's care.   -- Marilynne Drivers Rosana Hoes, MD, [place]: [facility] Office: [phone]

## 2016-06-22 NOTE — Anesthesia Preprocedure Evaluation (Signed)
Anesthesia Evaluation  Patient identified by MRN, date of birth, ID band Patient awake    Reviewed: Allergy & Precautions, NPO status , Patient's Chart, lab work & pertinent test results, reviewed documented beta blocker date and time   History of Anesthesia Complications Negative for: history of anesthetic complications  Airway Mallampati: II  TM Distance: >3 FB Neck ROM: Full    Dental  (+) Teeth Intact   Pulmonary neg pulmonary ROS,    breath sounds clear to auscultation       Cardiovascular Exercise Tolerance: Good  Rhythm:Regular Rate:Normal     Neuro/Psych    GI/Hepatic GERD  Medicated and Poorly Controlled,  Endo/Other    Renal/GU      Musculoskeletal   Abdominal (+)  Abdomen: tender.    Peds  Hematology   Anesthesia Other Findings   Reproductive/Obstetrics                             Anesthesia Physical Anesthesia Plan  ASA: II and emergent  Anesthesia Plan: General   Post-op Pain Management:    Induction: Rapid sequence, Cricoid pressure planned and Intravenous  Airway Management Planned: Oral ETT  Additional Equipment:   Intra-op Plan:   Post-operative Plan:   Informed Consent:   Plan Discussed with: Surgeon  Anesthesia Plan Comments:         Anesthesia Quick Evaluation

## 2016-06-23 ENCOUNTER — Encounter (HOSPITAL_COMMUNITY): Payer: Self-pay

## 2016-06-23 MED ORDER — DEXTROSE 5 % IV SOLN
1.0000 g | Freq: Two times a day (BID) | INTRAVENOUS | Status: AC
  Administered 2016-06-23: 1 g via INTRAVENOUS
  Filled 2016-06-23: qty 1

## 2016-06-23 MED ORDER — BUPIVACAINE LIPOSOME 1.3 % IJ SUSP
INTRAMUSCULAR | Status: DC | PRN
  Administered 2016-06-23: 20 mL

## 2016-06-23 MED ORDER — FENTANYL CITRATE (PF) 100 MCG/2ML IJ SOLN
INTRAMUSCULAR | Status: AC
  Filled 2016-06-23: qty 2

## 2016-06-23 MED ORDER — HYDROMORPHONE HCL 1 MG/ML IJ SOLN: 0.5000 mg | INTRAMUSCULAR | Status: DC | PRN

## 2016-06-23 MED ORDER — ARTIFICIAL TEARS OP OINT
TOPICAL_OINTMENT | OPHTHALMIC | Status: AC
  Filled 2016-06-23: qty 7

## 2016-06-23 MED ORDER — ENOXAPARIN SODIUM 40 MG/0.4ML ~~LOC~~ SOLN
40.0000 mg | SUBCUTANEOUS | Status: DC
  Administered 2016-06-24 – 2016-06-30 (×7): 40 mg via SUBCUTANEOUS
  Filled 2016-06-23 (×7): qty 0.4

## 2016-06-23 MED ORDER — FENTANYL CITRATE (PF) 250 MCG/5ML IJ SOLN
INTRAMUSCULAR | Status: AC
  Filled 2016-06-23: qty 5

## 2016-06-23 MED ORDER — NEOSTIGMINE METHYLSULFATE 10 MG/10ML IV SOLN
INTRAVENOUS | Status: DC | PRN
  Administered 2016-06-23: 2.5 mg via INTRAVENOUS

## 2016-06-23 MED ORDER — CEFOTETAN DISODIUM 1 G IJ SOLR
1.0000 g | Freq: Two times a day (BID) | INTRAMUSCULAR | Status: DC
  Filled 2016-06-23: qty 1

## 2016-06-23 MED ORDER — HYDROMORPHONE HCL 1 MG/ML IJ SOLN
0.5000 mg | INTRAMUSCULAR | Status: DC | PRN
  Administered 2016-06-23: 0.5 mg via INTRAVENOUS
  Filled 2016-06-23: qty 1

## 2016-06-23 MED ORDER — KETOROLAC TROMETHAMINE 15 MG/ML IJ SOLN
15.0000 mg | Freq: Once | INTRAMUSCULAR | Status: AC
  Administered 2016-06-23: 15 mg via INTRAVENOUS
  Filled 2016-06-23: qty 1

## 2016-06-23 MED ORDER — POVIDONE-IODINE 10 % EX OINT
TOPICAL_OINTMENT | CUTANEOUS | Status: DC | PRN
  Administered 2016-06-23: 1 via TOPICAL

## 2016-06-23 MED ORDER — ACETAMINOPHEN 325 MG PO TABS
650.0000 mg | ORAL_TABLET | Freq: Four times a day (QID) | ORAL | Status: DC | PRN
Start: 2016-06-23 — End: 2016-06-26
  Administered 2016-06-24 – 2016-06-26 (×2): 650 mg via ORAL
  Filled 2016-06-23 (×2): qty 2

## 2016-06-23 MED ORDER — POVIDONE-IODINE 10 % EX OINT
TOPICAL_OINTMENT | CUTANEOUS | Status: AC
  Filled 2016-06-23: qty 1

## 2016-06-23 MED ORDER — KCL IN DEXTROSE-NACL 20-5-0.45 MEQ/L-%-% IV SOLN
INTRAVENOUS | Status: DC
  Administered 2016-06-23 (×3): via INTRAVENOUS
  Filled 2016-06-23 (×4): qty 1000

## 2016-06-23 MED ORDER — LABETALOL HCL 5 MG/ML IV SOLN
INTRAVENOUS | Status: DC | PRN
  Administered 2016-06-23: 2.5 mg via INTRAVENOUS

## 2016-06-23 MED ORDER — OXYCODONE-ACETAMINOPHEN 5-325 MG PO TABS
1.0000 | ORAL_TABLET | ORAL | Status: DC | PRN
  Administered 2016-06-23 – 2016-06-26 (×10): 1 via ORAL
  Filled 2016-06-23 (×10): qty 1

## 2016-06-23 MED ORDER — GLYCOPYRROLATE 0.2 MG/ML IJ SOLN
INTRAMUSCULAR | Status: DC | PRN
  Administered 2016-06-23: .4 mg via INTRAVENOUS

## 2016-06-23 MED ORDER — ONDANSETRON HCL 4 MG/2ML IJ SOLN
4.0000 mg | Freq: Three times a day (TID) | INTRAMUSCULAR | Status: DC
  Administered 2016-06-23 – 2016-06-29 (×19): 4 mg via INTRAVENOUS
  Filled 2016-06-23 (×21): qty 2

## 2016-06-23 NOTE — Anesthesia Postprocedure Evaluation (Signed)
Anesthesia Post Note  Patient: Rebecca Osborne  Procedure(s) Performed: Procedure(s) (LRB): EXPLORATORY LAPAROTOMY (N/A) SMALL BOWEL RESECTION (N/A) LYSIS OF ADHESION (N/A)  Patient location during evaluation: PACU Anesthesia Type: General Level of consciousness: awake and alert and oriented Pain management: pain level controlled Vital Signs Assessment: post-procedure vital signs reviewed and stable Respiratory status: spontaneous breathing Cardiovascular status: blood pressure returned to baseline Postop Assessment: no signs of nausea or vomiting Anesthetic complications: no Comments: Late entry     Last Vitals:  Vitals:   06/23/16 0330 06/23/16 0411  BP: (!) 134/55 (!) 128/59  Pulse: 65 64  Resp: 12 16  Temp:  36.7 C    Last Pain:  Vitals:   06/23/16 0411  TempSrc: Oral  PainSc: 5                  [name]

## 2016-06-23 NOTE — Op Note (Signed)
SURGICAL OPERATIVE REPORT  DATE OF PROCEDURE: 06/23/2016  ATTENDING Surgeon(s): Vickie Epley, MD  ANESTHESIA: general   PRE-OPERATIVE DIAGNOSIS: Closed loop small bowel obstruction attributable to post-hysterectomy adhesions (icd-10's: K56.52)  POST-OPERATIVE DIAGNOSIS: Closed loop small bowel obstruction attributable to post-hysterectomy adhesions with ~38 cm of overtly gangrenous distal jejunum (icd-10's: K56.52, K55.021)  PROCEDURE(S):  1.) Exploratory laparotomy 2.) Very extensive meticulous and painstaking sharp (scissors) lysis of diffusely dense fibrous intra-intestinal adhesions from the B/L and middle abdomen into the deep and posterior pelvis as well as adhesions to the anterior abdominal wall/peritoneum requiring 2.5 hours just for the lysis of adhesions, accounting for the majority of the procedure (cpt: 44005 #59 or #22) 3.) Segmental resection of ~38 cm of distal jejunum located in the midline deep/posterior pelvis (cpt: 44120)  INTRAOPERATIVE FINDINGS: Very extensive diffusely dense fibrous intra-intestinal adhesions from the B/L and middle abdomen into the deep and posterior pelvis as well as adhesions to the anterior abdominal wall + ~38 cm segment of overtly gangrenous distal jejunum located in the mid-posterior deep pelvis  INTRAVENOUS FLUIDS: 3300 mL crystalloid   ESTIMATED BLOOD LOSS: 100 mL   URINE OUTPUT: 450 mL   SPECIMENS: Source of Specimen:  gangrenous small bowel (distal jejunum)  IMPLANTS: None  DRAINS: none  COMPLICATIONS: None apparent  CONDITION AT END OF PROCEDURE: Hemodynamically stable and extubated  DISPOSITION OF PATIENT: PACU  INDICATIONS FOR PROCEDURE:  Patient is a 66 year old Female who presented to [facility] for acute onset of severe Right-sided lower abdominal pain x 10 hours (~11 am), associated with cessation of flatus/bowel function and nausea, the pain of which did not improve despite insertion of NG tube with only ~50 mL of  clear liquid drainage. Patient denied any recent diarrhea, prior similar episodes, fever/chills, CP, or SOB. CT of the abdomen and pelvis without PO or IV contrast demonstrated a closed loop SBO, possible internal hernia. All risks, benefits, and alternatives to above procedure were discussed with the patient and her family, all of patient's questions were answered to her expressed satisfaction, and informed consent was obtained and documented.  DETAILS OF PROCEDURE: Patient was brought to the operating suite and appropriately identified. General anesthesia was administered along with appropriate pre-operative antibiotics, and endotracheal intubation was performed by anesthetist (Foley catheter was placed prior to arrival in OR). In supine position, operative site was prepped and draped in the usual sterile fashion, and following a brief time out, a vertical lower midline incision was made using a #10 blade scalpel and extended deep through subcutaneous tissues to expose fascia, which was divided between the rectus abdominal muscles using cutting electrocautery.  Even prior to entering the abdominal cavity, dense fibrous adhesions between the underlying small intestines and anterior abdominal wall could be appreciated through the peritoneum, which was carefully entered using sharp division of a small focus of peritoneum, which was then meticulously dissected free from the adherent intestines and likewise painstakingly sharply dissected free from other adjacent loops of intestine, followed by division of the freed peritoneum proximal and distal to the initial entry site until the peritoneum was opened the length of the skin incision. This slow and steady systematic sharp scissor lysis of adhesions continued through the B/L and middle abdomen into the deep and posterior pelvis x 2.5 hours (accounting for the majority of the procedural time), taking care to avoid injury to small intestine, bladder, or  corresponding mesentery. During lysis of adhesions, the urine was noted to appear pink in comparison  to clear yellow at the beginning of the procedure. The bladder was closely inspected, and no injuries could be identified. Infusion of methylene blue through the Foley catheter was considered; however, the Foley catheter was inspected and found by the circulating RN to be under some tension, which was promptly corrected. Upon relieving the Foley catheter tension, the urine quickly cleared and never again throughout the case became pink/red. Increasing segments of small intestine were progressively and systematically freed by extensive sharp lysis of adhesions using scissors until a single final loop was found to be herniating through a series of deep/posterior mid-pelvic fibrous bands. Upon release of these final dense fibrotic deep/posterior midline pelvis adhesions, a 38 cm segment of overtly gangrenous/necrotic distal jejunum was released and confirmed to be clearly non-viable.  Small bowel resection was performed using GIA-55 linear cutting stapler with reload. Division of the small intestinal mesentery and hemostasis were achieved using the Ligasure device along the mesenteric side of the necrotic/gangrenous distal jejunum. This segment of mesentery was confirmed to be hemostatic, and specimen was handed off the field for pathology. The anti-mesenteric border of the two free ends of well-perfused small intestine were then brought together and confirmed to lay adjacent without twisting, kinks, or tension, and a common anti-mesenteric channel was created using a GIA-55 reload, and GIA-55 was used to close the resulting enterotomy. A running 3-0 Vicryl suture was used to close the resulting mesenteric defect and to oversew the anastomosis with interrupted seromuscular Lembert sutures, over which omentum was placed following final inspection to confirm a viable hemostatic and widely patent anastomosis without  tension.  The abdominal wall was then re-approximated in layers with #1 looped PDS sutures from the top and bottom of the incision. Exparel (72-hour release liposomal formulation of bupivicane) was injected into the fascia and subcutaneously, and surgical skin staples were then used to re-approximate skin. Skin was then cleaned and dried, and a sterile dressing was applied. Patient was then safely able to be extubated, awakened, and transferred to PACU for post-operative monitoring and care.  I was present for all aspects of the above procedure, and there were no complications apparent.

## 2016-06-23 NOTE — Addendum Note (Signed)
Addendum  created 06/23/16 1110 by Ollen Bowl, CRNA   Sign clinical note

## 2016-06-23 NOTE — Progress Notes (Signed)
SURGICAL POST-OPERATIVE NOTE   Patient seen and examined, no acute post-operative events, reports pain is "less than half of what it was before surgery", and patient reports abdominal "rumbling" and a sore throat, denies flatus, N/V, fever/chills, CP, or SOB.   Review of Systems:  Constitutional: denies fever, chills  HEENT: denies cough or congestion  Respiratory: denies any shortness of breath  Cardiovascular: denies chest pain or palpitations  Gastrointestinal: abdominal pain, N/V, and bowel function as per interval history  Musculoskeletal: denies pain, decreased motor or sensation  Neurological: denies HA or vision/hearing changes   Vital signs  Vitals:   06/23/16 0330 06/23/16 0411 06/23/16 0730 06/23/16 1130  BP: (!) 134/55 (!) 128/59 126/60 (!) 107/56  Pulse: 65 64 91 96  Resp: 12 16 16 18   Temp:  98 F (36.7 C) 98.8 F (37.1 C) 99.7 F (37.6 C)  TempSrc:  Oral Oral Oral  SpO2: 100% 100% 97% 96%  Weight:  49.9 kg (109 lb 15.5 oz)    Height:  5' 1"  (1.549 m)         Height: 5' 1"  (154.9 cm)  Weight: 49.9 kg (109 lb 15.5 oz) BMI (Calculated): 20.8   Intake/Output:  No intake/output data recorded.   Physical Exam:  General: Alert, cooperative and no distress  Neuro: CNII - XII grossly intact and symmetric, no motor or sensory deficits  HENT: Normocephalic, without obvious abnormality  Eyes: PERRL, EOM's grossly intact and symmetric  Lungs: Breathing non-labored at rest  Cardiovascular: Regular rate and sinus rhythm  Gastrointestinal: Abdomen soft with mild peri-incisional tenderness to palpation, ND, and incision well-approximated without erythema or drainage, dressing c/d/i  Extremities: UE and LE FROM, NT, no edema or wounds   Assessment/Plan:  66 y.o. female doing overall well with hopefully short-term post-operative ileus considering patient reporting subjective "rumbling" along with intra-operative observed peristalsis of non-dilated small intestine proximal  and distal to resected gangrenous bowel POD #0 s/p exploratory laparotomy with extensive adhesiolysis, and segmental small bowel resection for closed loop bowel obstruction with focal segmental gangrene/necrosis attributed to post-hysterectomy adhesions, complicated by pertinent comorbidities including history of cervical cancer, chronic constipation, chronic urinary retention/pelvic floor laxity relieved with self-applied pelvic pressure, and GERD.   - pain control as needed   - discontinue nasogastric tube   - okay to have ice chips, sips of water, and PO medications  - monitor bowel function and abdominal exam  - ambulation encouraged, DVT prophylaxis   All of the above findings and recommendations were discussed with the patient, and all of her questions were answered to her expressed satisfaction.  -- Marilynne Drivers Rosana Hoes, MD, [place]: [facility] Office: [phone]

## 2016-06-23 NOTE — Anesthesia Postprocedure Evaluation (Signed)
Anesthesia Post Note  Patient: Rebecca Osborne  Procedure(s) Performed: Procedure(s) (LRB): EXPLORATORY LAPAROTOMY (N/A) SMALL BOWEL RESECTION (N/A) LYSIS OF ADHESION (N/A)  Patient location during evaluation: Nursing Unit Anesthesia Type: General Level of consciousness: awake and alert and oriented Pain management: satisfactory to patient Vital Signs Assessment: post-procedure vital signs reviewed and stable Respiratory status: spontaneous breathing Cardiovascular status: blood pressure returned to baseline and stable Postop Assessment: no signs of nausea or vomiting Anesthetic complications: no     Last Vitals:  Vitals:   06/23/16 0411 06/23/16 0730  BP: (!) 128/59 126/60  Pulse: 64 91  Resp: 16 16  Temp: 36.7 C 37.1 C    Last Pain:  Vitals:   06/23/16 0730  TempSrc: Oral  PainSc:                  Rebecca Osborne

## 2016-06-23 NOTE — Transfer of Care (Signed)
Immediate Anesthesia Transfer of Care Note  Patient: Rebecca Osborne  Procedure(s) Performed: Procedure(s): EXPLORATORY LAPAROTOMY (N/A) SMALL BOWEL RESECTION (N/A) LYSIS OF ADHESION (N/A)  Patient Location: PACU  Anesthesia Type:General  Level of Consciousness: awake, alert  and oriented  Airway & Oxygen Therapy: Patient Spontanous Breathing and Patient connected to face mask oxygen  Post-op Assessment: Report given to RN  Post vital signs: Reviewed and stable  Last Vitals:  Vitals:   06/22/16 2230 06/22/16 2245  BP: 172/75   Pulse: 78 73  Resp:    Temp:      Last Pain:  Vitals:   06/22/16 2200  TempSrc: Oral  PainSc:          Complications: No apparent anesthesia complications

## 2016-06-24 ENCOUNTER — Encounter (HOSPITAL_COMMUNITY): Payer: Self-pay | Admitting: Surgery

## 2016-06-24 LAB — BASIC METABOLIC PANEL
Anion gap: 5 (ref 5–15)
BUN: 18 mg/dL (ref 6–20)
CO2: 26 mmol/L (ref 22–32)
CREATININE: 0.99 mg/dL (ref 0.44–1.00)
Calcium: 8.1 mg/dL — ABNORMAL LOW (ref 8.9–10.3)
Chloride: 103 mmol/L (ref 101–111)
GFR calc Af Amer: >60 mL/min (ref >60)
GFR, EST NON AFRICAN AMERICAN: 59 mL/min — AB (ref >60)
GLUCOSE: 113 mg/dL — AB (ref 65–99)
POTASSIUM: 4.6 mmol/L (ref 3.5–5.1)
SODIUM: 134 mmol/L — AB (ref 135–145)

## 2016-06-24 LAB — CBC
HEMATOCRIT: 30.9 % — AB (ref 36.0–46.0)
HEMOGLOBIN: 10 g/dL — AB (ref 12.0–15.0)
MCH: 30.2 pg (ref 26.0–34.0)
MCHC: 32.4 g/dL (ref 30.0–36.0)
MCV: 93.4 fL (ref 78.0–100.0)
PLATELETS: 174 10*3/uL (ref 150–400)
RBC: 3.31 MIL/uL — ABNORMAL LOW (ref 3.87–5.11)
RDW: 13.4 % (ref 11.5–15.5)
WBC: 9.6 10*3/uL (ref 4.0–10.5)

## 2016-06-24 MED ORDER — DEXTROSE-NACL 5-0.45 % IV SOLN
INTRAVENOUS | Status: DC
  Administered 2016-06-24 – 2016-06-27 (×5): via INTRAVENOUS

## 2016-06-24 MED ORDER — ASPIRIN EC 81 MG PO TBEC
81.0000 mg | DELAYED_RELEASE_TABLET | Freq: Every day | ORAL | Status: DC
  Administered 2016-06-24 – 2016-06-30 (×7): 81 mg via ORAL
  Filled 2016-06-24 (×7): qty 1

## 2016-06-24 MED ORDER — IBUPROFEN 600 MG PO TABS
600.0000 mg | ORAL_TABLET | ORAL | Status: DC | PRN
  Administered 2016-06-24 – 2016-06-25 (×2): 600 mg via ORAL
  Filled 2016-06-24 (×2): qty 1

## 2016-06-24 MED ORDER — PANTOPRAZOLE SODIUM 40 MG PO TBEC
40.0000 mg | DELAYED_RELEASE_TABLET | Freq: Every day | ORAL | Status: DC
  Administered 2016-06-24 – 2016-06-29 (×6): 40 mg via ORAL
  Filled 2016-06-24 (×6): qty 1

## 2016-06-24 MED ORDER — ALUM & MAG HYDROXIDE-SIMETH 200-200-20 MG/5ML PO SUSP: 30.0000 mL | Freq: Four times a day (QID) | ORAL | Status: DC | PRN

## 2016-06-24 NOTE — Progress Notes (Signed)
[place] Hospital Day(s): 2.   Post op day(s): 2 Days Post-Op.   Interval History: Patient seen and examined, no acute events or new complaints overnight. Patient reports pain is mild and well-controlled, reports abdominal "rumbling" and tolerating sips of water and ice chips without flatus, N/V, fever/chills, CP, or SOB.  Review of Systems:  Constitutional: denies fever, chills  HEENT: denies cough or congestion  Respiratory: denies any shortness of breath  Cardiovascular: denies chest pain or palpitations  Gastrointestinal: abdominal pain, N/V, and bowel function as per interval history Genitourinary: denies burning with urination or urinary frequency Musculoskeletal: denies pain, decreased motor or sensation Integumentary: denies any other rashes or skin discolorations Neurological: denies HA or vision/hearing changes   Vital signs in last 24 hours: [min-max] current  Temp:  [98.8 F (37.1 C)-99.9 F (37.7 C)] 99.1 F (37.3 C) (01/19 0622) Pulse Rate:  [87-96] 88 (01/19 0622) Resp:  [16-18] 18 (01/19 0622) BP: (90-112)/(44-58) 93/44 (01/19 0622) SpO2:  [94 %-97 %] 96 % (01/19 0622)     Height: 5' 1"  (154.9 cm) Weight: 49.9 kg (109 lb 15.5 oz) BMI (Calculated): 20.8   Intake/Output this shift:  No intake/output data recorded.   Intake/Output last 2 shifts:  @IOLAST2SHIFTS @   Physical Exam:  Constitutional: alert, cooperative and no distress  HENT: normocephalic without obvious abnormality  Eyes: PERRL, EOM's grossly intact and symmetric  Neuro: CN II - XII grossly intact and symmetric without deficit  Respiratory: breathing non-labored at rest  Cardiovascular: regular rate and sinus rhythm  Gastrointestinal: abdomen soft, mild peri-incisional tenderness, and non-distended with incision well-approximated without erythema or drainage  Musculoskeletal: UE and LE FROM, no edema or wounds, motor and sensation grossly intact, NT   Labs:  CBC:  Lab Results   Component Value Date   WBC 9.6 06/24/2016   RBC 3.31 (L) 06/24/2016   BMP:  Lab Results  Component Value Date   GLUCOSE 113 (H) 06/24/2016   CO2 26 06/24/2016   BUN 18 06/24/2016   CREATININE 0.99 06/24/2016   CREATININE 0.80 07/14/2015   CALCIUM 8.1 (L) 06/24/2016     Imaging studies: No new pertinent imaging studies   Assessment/Plan:  66 y.o. female doing overall well with hopefully short-term post-operative ileus considering patient reporting subjective "rumbling" along with intra-operative observed peristalsis of non-dilated small intestine proximal and distal to resected gangrenous bowel POD #1 s/p exploratory laparotomy with extensive adhesiolysis, and segmental small bowel resection for closed loop bowel obstruction with focal segmental gangrene/necrosis attributed to post-hysterectomy adhesions, complicated by pertinent comorbidities including history of cervical cancer, chronic constipation, chronic urinary retention/pelvic floor laxity relieved with self-applied pelvic pressure, and GERD.              - pain control as needed             - discontinue nasogastric tube              - monitor bowel function and abdominal exam             - okay to have ice chips, sips of water, and PO medications  - anticipate clear liquids diet tomorrow morning             - ambulation encouraged, DVT prophylaxis   All of the above findings and recommendations were discussed with the patient, and all of her questions were answered to her expressed satisfaction.  -- Marilynne Drivers Rosana Hoes, MD, [place]: [facility] Office: [phone]

## 2016-06-25 LAB — CBC
HCT: 28.2 % — ABNORMAL LOW (ref 36.0–46.0)
Hemoglobin: 9.4 g/dL — ABNORMAL LOW (ref 12.0–15.0)
MCH: 31 pg (ref 26.0–34.0)
MCHC: 33.3 g/dL (ref 30.0–36.0)
MCV: 93.1 fL (ref 78.0–100.0)
PLATELETS: 165 10*3/uL (ref 150–400)
RBC: 3.03 MIL/uL — AB (ref 3.87–5.11)
RDW: 13.5 % (ref 11.5–15.5)
WBC: 6.9 10*3/uL (ref 4.0–10.5)

## 2016-06-25 LAB — BASIC METABOLIC PANEL
Anion gap: 6 (ref 5–15)
BUN: 14 mg/dL (ref 6–20)
CO2: 27 mmol/L (ref 22–32)
CREATININE: 0.99 mg/dL (ref 0.44–1.00)
Calcium: 8.5 mg/dL — ABNORMAL LOW (ref 8.9–10.3)
Chloride: 105 mmol/L (ref 101–111)
GFR calc Af Amer: >60 mL/min (ref >60)
GFR, EST NON AFRICAN AMERICAN: 59 mL/min — AB (ref >60)
Glucose, Bld: 97 mg/dL (ref 65–99)
POTASSIUM: 3.8 mmol/L (ref 3.5–5.1)
SODIUM: 138 mmol/L (ref 135–145)

## 2016-06-25 MED ORDER — ALUM & MAG HYDROXIDE-SIMETH 200-200-20 MG/5ML PO SUSP
30.0000 mL | Freq: Four times a day (QID) | ORAL | Status: DC | PRN
  Administered 2016-06-25: 30 mL via ORAL
  Filled 2016-06-25: qty 30

## 2016-06-25 NOTE — Progress Notes (Addendum)
[facility] Hospital Day(s): 3.   Post op day(s): 3 Days Post-Op.   Interval History: Patient seen and examined, no acute events or new complaints overnight. Patient reports some peri-incisional soreness that she attributed to her dressing and pain at Lovenox injection sites, denies flatus/BM, N/V, fever/chills, CP, or SOB, and reports she has started sitting in the chair (x2) and walking some (also x2). She's also been tolerating clear liquids prn.  Review of Systems:  Constitutional: denies fever, chills  HEENT: denies cough or congestion  Respiratory: denies any shortness of breath  Cardiovascular: denies chest pain or palpitations  Gastrointestinal: abdominal pain, N/V, and bowel function as per interval history Genitourinary: denies burning with urination or urinary frequency Musculoskeletal: denies pain, decreased motor or sensation Integumentary: denies any other rashes or skin discolorations Neurological: denies HA or vision/hearing changes   Vital signs in last 24 hours: [min-max] current  Temp:  [97.6 F (36.4 C)-98.8 F (37.1 C)] 98.2 F (36.8 C) (01/20 0530) Pulse Rate:  [77-98] 77 (01/20 0530) Resp:  [18] 18 (01/20 0530) BP: (86-113)/(44-55) 108/44 (01/20 0530) SpO2:  [95 %-97 %] 96 % (01/20 0530)     Height: 5' 1"  (154.9 cm) Weight: 49.9 kg (109 lb 15.5 oz) BMI (Calculated): 20.8   Intake/Output this shift:  Total I/O In: -  Out: 200 [Urine:200]   Intake/Output last 2 shifts:  @IOLAST2SHIFTS @   Physical Exam:  Constitutional: alert, cooperative and no distress  HENT: normocephalic without obvious abnormality  Eyes: PERRL, EOM's grossly intact and symmetric  Neuro: CN II - XII grossly intact and symmetric without deficit  Respiratory: breathing non-labored at rest  Cardiovascular: regular rate and sinus rhythm  Gastrointestinal: soft, mild peri-incisional tenderness to palpation, non-distended, incision well-approximated without erythema or  drainage  Musculoskeletal: UE and LE FROM, no edema or wounds, motor and sensation grossly intact, NT   Labs:  CBC:  Lab Results  Component Value Date   WBC 6.9 06/25/2016   RBC 3.03 (L) 06/25/2016   Hb 9.4 (from 10.0 on 06/24/2016)  BMP:  Lab Results  Component Value Date   GLUCOSE 97 06/25/2016   CO2 27 06/25/2016   BUN 14 06/25/2016   CREATININE 0.99 06/25/2016   CREATININE 0.80 07/14/2015   CALCIUM 8.5 (L) 06/25/2016     Imaging studies: No new pertinent imaging studies   Assessment/Plan:  66 y.o.femaledoing overall well with post-operative ileus considering patient reporting subjective "rumbling" along with intra-operative observed peristalsis of non-dilated small intestine proximal and distal to resected gangrenous bowel POD #3 s/p exploratory laparotomy with extensive adhesiolysis, and segmental small bowel resection for closed loop bowel obstruction with focal segmental gangrene/necrosis attributed to post-hysterectomy adhesions, complicated by pertinent comorbidities including history of cervical cancer, chronic constipation, chronic urinary retention/pelvic floor laxity relieved with self-applied pelvic pressure, and GERD.  - pain control as needed - monitor bowel function and abdominal exam - continue clear liquids diet prn with PO medications - ambulation encouraged, DVT prophylaxis  All of the above findings and recommendations were discussed with the patient, and all of her questions were answered to her expressed satisfaction.  -- Marilynne Drivers Rosana Hoes, MD, [place]: [facility] Office: [phone]

## 2016-06-26 MED ORDER — OXYCODONE HCL 5 MG PO TABS
5.0000 mg | ORAL_TABLET | ORAL | Status: DC | PRN
  Administered 2016-06-26 – 2016-06-29 (×17): 5 mg via ORAL
  Filled 2016-06-26 (×18): qty 1

## 2016-06-26 MED ORDER — ACETAMINOPHEN 325 MG PO TABS
650.0000 mg | ORAL_TABLET | Freq: Four times a day (QID) | ORAL | Status: AC
  Administered 2016-06-26 – 2016-06-28 (×7): 650 mg via ORAL
  Filled 2016-06-26 (×8): qty 2

## 2016-06-26 MED ORDER — IBUPROFEN 600 MG PO TABS
600.0000 mg | ORAL_TABLET | Freq: Four times a day (QID) | ORAL | Status: AC
  Administered 2016-06-26 – 2016-06-28 (×8): 600 mg via ORAL
  Filled 2016-06-26 (×8): qty 1

## 2016-06-26 NOTE — Progress Notes (Addendum)
[facility] Hospital Day(s): 4.   Post op day(s): 4 Days Post-Op.   Interval History: Patient seen and examined, no acute events or new complaints overnight. Patient reports her pain has been controlled, and she denies N/V, fever/chills, CP, or SOB, though she did not get out of bed yesterday and has not yet passed flatus, states she feels "rumbling" and feels like she's going to pass flatus or BM soon. Patient tolerating clear liquids, requests something more to eat.  Review of Systems:  Constitutional: denies fever, chills  HEENT: denies cough or congestion  Respiratory: denies any shortness of breath  Cardiovascular: denies chest pain or palpitations  Gastrointestinal: abdominal pain, N/V, or diarrhea Genitourinary: denies burning with urination or urinary frequency Musculoskeletal: denies pain, decreased motor or sensation Integumentary: denies any other rashes or skin discolorations Neurological: denies HA or vision/hearing changes   Vital signs in last 24 hours: [min-max] current  Temp:  [98.3 F (36.8 C)-99.3 F (37.4 C)] 98.3 F (36.8 C) (01/21 0650) Pulse Rate:  [72-76] 76 (01/21 0650) Resp:  [18] 18 (01/21 0650) BP: (101-122)/(49-59) 122/59 (01/21 0650) SpO2:  [72 %-76 %] 76 % (01/21 0650)     Height: 5' 1"  (154.9 cm) Weight: 49.9 kg (109 lb 15.5 oz) BMI (Calculated): 20.8   Intake/Output this shift:  Total I/O In: -  Out: 300 [Urine:300]   Intake/Output last 2 shifts:  @IOLAST2SHIFTS @   Physical Exam:  Constitutional: alert, cooperative and no distress  HENT: normocephalic without obvious abnormality  Eyes: PERRL, EOM's grossly intact and symmetric  Neuro: CN II - XII grossly intact and symmetric without deficit  Respiratory: breathing non-labored at rest  Cardiovascular: regular rate and sinus rhythm  Gastrointestinal: soft, minimal peri-incisional tenderness to palpation, non-distended, incision well-approximated without erythema or drainage,  dressing c/d/i  Musculoskeletal: UE and LE FROM, no edema or wounds, motor and sensation grossly intact, NT   Labs:  CBC:  Lab Results  Component Value Date   WBC 6.9 06/25/2016   RBC 3.03 (L) 06/25/2016   BMP:  Lab Results  Component Value Date   GLUCOSE 97 06/25/2016   CO2 27 06/25/2016   BUN 14 06/25/2016   CREATININE 0.99 06/25/2016   CREATININE 0.80 07/14/2015   CALCIUM 8.5 (L) 06/25/2016     Imaging studies: No new pertinent imaging studies   Assessment/Plan:  66 y.o.femaledoing overall well with post-operative ileus considering patient reporting subjective "rumbling" along with intra-operative observed peristalsis of non-dilated small intestine proximal and distal to resected gangrenous bowel POD #4 s/p exploratory laparotomy with extensive adhesiolysis, and segmental small bowel resection for closed loop bowel obstruction with focal segmental gangrene/necrosis attributed to post-hysterectomy adhesions, complicated by pertinent comorbidities including history of cervical cancer, chronic constipation, chronic urinary retention/pelvic floor laxity relieved with self-applied pelvic pressure, and GERD.  - pain control as needed - monitor bowel function and abdominal exam - advanced to full liquids diet with PO medications  - Tylenol and ibuprofen made routine x2 days to reduce narcotics  - advance diet and discharge planning once flatus - ambulation encouraged, DVT prophylaxis  All of the above findings and recommendations were discussed with the patient, and all of her questions were answered to her expressed satisfaction.  -- Marilynne Drivers Rosana Hoes, MD, [place]: [facility] Office: [phone]

## 2016-06-27 ENCOUNTER — Inpatient Hospital Stay (HOSPITAL_COMMUNITY): Payer: Medicare Other

## 2016-06-27 LAB — CBC WITH DIFFERENTIAL/PLATELET
BASOS PCT: 0 %
Basophils Absolute: 0 10*3/uL (ref 0.0–0.1)
EOS ABS: 0.1 10*3/uL (ref 0.0–0.7)
Eosinophils Relative: 2 %
HCT: 27.1 % — ABNORMAL LOW (ref 36.0–46.0)
HEMOGLOBIN: 9.3 g/dL — AB (ref 12.0–15.0)
LYMPHS ABS: 0.9 10*3/uL (ref 0.7–4.0)
Lymphocytes Relative: 12 %
MCH: 31.1 pg (ref 26.0–34.0)
MCHC: 34.3 g/dL (ref 30.0–36.0)
MCV: 90.6 fL (ref 78.0–100.0)
Monocytes Absolute: 0.4 10*3/uL (ref 0.1–1.0)
Monocytes Relative: 5 %
NEUTROS PCT: 81 %
Neutro Abs: 6 10*3/uL (ref 1.7–7.7)
Platelets: 247 10*3/uL (ref 150–400)
RBC: 2.99 MIL/uL — AB (ref 3.87–5.11)
RDW: 13 % (ref 11.5–15.5)
WBC: 7.4 10*3/uL (ref 4.0–10.5)

## 2016-06-27 LAB — BASIC METABOLIC PANEL
ANION GAP: 6 (ref 5–15)
BUN: 10 mg/dL (ref 6–20)
CHLORIDE: 104 mmol/L (ref 101–111)
CO2: 24 mmol/L (ref 22–32)
Calcium: 8.1 mg/dL — ABNORMAL LOW (ref 8.9–10.3)
Creatinine, Ser: 0.82 mg/dL (ref 0.44–1.00)
GFR calc Af Amer: >60 mL/min (ref >60)
GFR calc non Af Amer: >60 mL/min (ref >60)
Glucose, Bld: 116 mg/dL — ABNORMAL HIGH (ref 65–99)
POTASSIUM: 3.4 mmol/L — AB (ref 3.5–5.1)
SODIUM: 134 mmol/L — AB (ref 135–145)

## 2016-06-27 MED ORDER — SORBITOL 70 % SOLN
960.0000 mL | TOPICAL_OIL | Freq: Once | ORAL | Status: AC
  Administered 2016-06-27: 960 mL via RECTAL
  Filled 2016-06-27: qty 240

## 2016-06-27 MED ORDER — BISACODYL 10 MG RE SUPP
10.0000 mg | Freq: Once | RECTAL | Status: AC
  Administered 2016-06-27: 10 mg via RECTAL
  Filled 2016-06-27: qty 1

## 2016-06-27 MED ORDER — IOPAMIDOL (ISOVUE-300) INJECTION 61%
INTRAVENOUS | Status: AC
  Administered 2016-06-28: 30 mL
  Filled 2016-06-27: qty 30

## 2016-06-27 NOTE — Progress Notes (Signed)
ADDENDUM: Patient seen and examined for follow-up after RN reports enema could not be administered due to rectal resistance. Patient reports that she has been unable to have BM's for the past year without manual self-disimpaction, for which she has never sought attention. She says she thinks her last colonoscopy was within the past 5 years, but she is unable to recall having had one or who performed it. Abdomen remains soft with only mild distension and mild peri-incisional tenderness to palpation. Rectal exam demonstrates no stool and no gross blood with appropriate rectal tone and only small non-obstructing hemorrhoids, no palpable mass, and no evidence of rectal prolapse. Patient also has mild erythema overlying a prominent coccyx, consistent with a mild early stage 1 pressure sore, in an area patient reports chronic pain at home as well.  Assessment/Plan:  66 y.o.femalewith post-operative ileus +/- constipation considering patient reporting subjective "rumbling" along with intra-operative observed peristalsis of non-dilated small intestine proximal and distal to resected gangrenous bowel POD #5 s/p exploratory laparotomy with extensive adhesiolysis, and segmental small bowel resection for closed loop bowel obstruction with focal segmental gangrene/necrosis attributed to post-hysterectomy adhesions, complicated by pertinent comorbidities including history of cervical cancer, chronic constipation with chronic fecal impaction requiring routine manual self-disimpaction prior to every BM x1 year, chronic urinary retention/pelvic floor laxity relieved with self-applied pelvic pressure, and GERD.   - CT A/P with PO contrast, allowing 4 hours after patient finishes contrast to allow for adequate transit time  - avoid coccygeal pressure by frequent repositioning on patient's sides and monitoring of sacral-coccygeal skin  - will also check CBC and BMP, since not checked in a few days considering normal WBC and  tolerating diet  All of the above findings and recommendations were discussed with the patient and her nurse, and all of patient's questions were answered to her expressed satisfaction.  -- Marilynne Drivers Rosana Hoes, MD, [place]: [facility] Surgery [facility] V[facility] Office #: [phone]

## 2016-06-27 NOTE — Progress Notes (Signed)
PT drinking contrast. Now completing second bottle. Will contact radiology once she has completed and have CT scan performed 4 hours from finish time. Continue to monitor.

## 2016-06-27 NOTE — Progress Notes (Signed)
[place] Hospital Day(s): 5.   Post op day(s): 5 Days Post-Op.   Interval History: Patient seen and examined, no acute events or new complaints overnight. Patient reports a history of constipation with hard stools x several months and states that she feels like she needs to have a BM with +rumbling, unsure if +flatus, but says she feels similar to when she feels constipated, denies N/V, fever/chills, CP, or SOB. She otherwise has been ambulating with her pain overall controlled and tolerating full liquids diet.  Review of Systems:  Constitutional: denies fever, chills  HEENT: denies cough or congestion  Respiratory: denies any shortness of breath  Cardiovascular: denies chest pain or palpitations  Gastrointestinal: abdominal pain, N/V, and bowel function as per interval history Genitourinary: denies burning with urination or urinary frequency Musculoskeletal: denies pain, decreased motor or sensation Integumentary: denies any other rashes or skin discolorations Neurological: denies HA or vision/hearing changes   Vital signs in last 24 hours: [min-max] current  Temp:  [98.4 F (36.9 C)-98.5 F (36.9 C)] 98.5 F (36.9 C) (01/22 0550) Pulse Rate:  [72-79] 72 (01/22 0550) Resp:  [18] 18 (01/22 0550) BP: (145-152)/(61-68) 152/68 (01/22 0550) SpO2:  [96 %-98 %] 96 % (01/22 0550)     Height: 5' 1"  (154.9 cm) Weight: 49.9 kg (109 lb 15.5 oz) BMI (Calculated): 20.8   Intake/Output this shift:  Total I/O In: -  Out: 500 [Urine:500]   Intake/Output last 2 shifts:  @IOLAST2SHIFTS @   Physical Exam:  Constitutional: alert, cooperative and no distress  HENT: normocephalic without obvious abnormality  Eyes: PERRL, EOM's grossly intact and symmetric  Neuro: CN II - XII grossly intact and symmetric without deficit  Respiratory: breathing non-labored at rest  Cardiovascular: regular rate and sinus rhythm  Gastrointestinal: soft, mild tenderness to palpation only directly  over the incision, and non-distended with incision well-approximated without erythema or drainage, dressing c/d/i Musculoskeletal: UE and LE FROM, no edema or wounds, motor and sensation grossly intact, NT   Labs:  CBC:  Lab Results  Component Value Date   WBC 6.9 06/25/2016   RBC 3.03 (L) 06/25/2016   BMP:  Lab Results  Component Value Date   GLUCOSE 97 06/25/2016   CO2 27 06/25/2016   BUN 14 06/25/2016   CREATININE 0.99 06/25/2016   CREATININE 0.80 07/14/2015   CALCIUM 8.5 (L) 06/25/2016     Imaging studies: No new pertinent imaging studies   Assessment/Plan:  66 y.o.femaledoing overall well with ongoing post-operative ileus +/- constipation considering patient reporting subjective "rumbling" along with intra-operative observed peristalsis of non-dilated small intestine proximal and distal to resected gangrenous bowel POD #5 s/p exploratory laparotomy with extensive adhesiolysis, and segmental small bowel resection for closed loop bowel obstruction with focal segmental gangrene/necrosis attributed to post-hysterectomy adhesions, complicated by pertinent comorbidities including history of cervical cancer, chronic constipation, chronic urinary retention/pelvic floor laxity relieved with self-applied pelvic pressure, and GERD.  - pain control prn, minimize narcotics - monitor bowel function and abdominal exam - continue full liquids diet withPO medications             - routine Tylenol and ibuprofen x2 days to reduce narcotics prn  - ordered suppository x1 and enema x1 per discussion with patient             - advance diet and discharge planning once flatus  - may consider CT if no flatus/BM by tomorrow - ambulation encouraged  - DVT prophylaxis  All of the above  findings and recommendations were discussed with the patient, and all of her questions were answered to her expressed satisfaction.  -- Marilynne Drivers Rosana Hoes, MD,  [place]: [place] [facility] Office: [phone]

## 2016-06-28 MED ORDER — ALUM & MAG HYDROXIDE-SIMETH 200-200-20 MG/5ML PO SUSP
30.0000 mL | Freq: Once | ORAL | Status: AC
  Administered 2016-06-28: 30 mL via ORAL
  Filled 2016-06-28: qty 30

## 2016-06-28 MED ORDER — KCL IN DEXTROSE-NACL 20-5-0.45 MEQ/L-%-% IV SOLN
INTRAVENOUS | Status: DC
  Administered 2016-06-28 – 2016-06-30 (×5): via INTRAVENOUS

## 2016-06-28 MED ORDER — IOPAMIDOL (ISOVUE-300) INJECTION 61%
100.0000 mL | Freq: Once | INTRAVENOUS | Status: AC | PRN
  Administered 2016-06-28: 100 mL via INTRAVENOUS

## 2016-06-28 NOTE — Progress Notes (Signed)
PT states she was passing a lot of gas and had "clear running liquid" stool. Very small. Continue to monitor

## 2016-06-28 NOTE — Progress Notes (Addendum)
ADDENDUM: Patient passing flatus and ambulating several times throughout the day, less distended and less tender. Diet advanced back to full liquids diet. Will reassess again in am and plan to advance to mechanical soft diet tomorrow, discharge planning soon (tomorrow or Thursday).  -- Marilynne Drivers Rosana Hoes, MD, [place]: [facility] General Surgery and Vascular Care Office #: [phone]       [place] Hospital Day(s): 6.   Post op day(s): 6 Days Post-Op.   Interval History: Patient seen and examined, CT with oral contrast performed overnight. Patient reports passing a lot of flatus and some clear liquid BM's, feels less bloated this morning, denies fever/chills, CP, or SOB, agrees to ambulate.  Review of Systems:  Constitutional: denies fever, chills  HEENT: denies cough or congestion  Respiratory: denies any shortness of breath  Cardiovascular: denies chest pain or palpitations  Gastrointestinal: abdominal pain, N/V, and bowel function as per interval history Genitourinary: denies burning with urination or urinary frequency Musculoskeletal: denies pain, decreased motor or sensation Integumentary: denies any other rashes or skin discolorations Neurological: denies HA or vision/hearing changes   Vital signs in last 24 hours: [min-max] current  Temp:  [98.2 F (36.8 C)-98.4 F (36.9 C)] 98.4 F (36.9 C) (01/23 0554) Pulse Rate:  [74-77] 74 (01/23 0554) Resp:  [18] 18 (01/23 0554) BP: (132-158)/(64-75) 132/71 (01/23 0554) SpO2:  [97 %-98 %] 97 % (01/23 0554)     Height: 5' 1"  (154.9 cm) Weight: 49.9 kg (109 lb 15.5 oz) BMI (Calculated): 20.8   Intake/Output this shift:  No intake/output data recorded.   Intake/Output last 2 shifts:  @IOLAST2SHIFTS @   Physical Exam:  Constitutional: alert, cooperative and no distress  HENT: normocephalic without obvious abnormality  Eyes: PERRL, EOM's grossly intact and symmetric  Neuro: CN II - XII  grossly intact and symmetric without deficit  Respiratory: breathing non-labored at rest  Cardiovascular: regular rate and sinus rhythm  Gastrointestinal: soft, minimal peri-incisional tenderness, mild abdominal distention  Musculoskeletal: UE and LE FROM, no edema or wounds, motor and sensation grossly intact, NT   Labs:  CBC:  Lab Results  Component Value Date   WBC 7.4 06/27/2016   RBC 2.99 (L) 06/27/2016   BMP:  Lab Results  Component Value Date   GLUCOSE 116 (H) 06/27/2016   CO2 24 06/27/2016   BUN 10 06/27/2016   CREATININE 0.82 06/27/2016   CREATININE 0.80 07/14/2015   CALCIUM 8.1 (L) 06/27/2016     Imaging studies:  CT Abdomen and Pelvis with Contrast (1/22/20178) - personally reviewed with patient and her RN at bedside  Dilated jejunal loops extending to chain sutures in the left lower quadrant where the patient has undergone jejunal resection with reanastomosis. Proximal jejunal loops measure up to 3.9 cm in caliber. Contrast however reaches terminal ileum. There is mild postoperative edema at the anastomotic site. Findings are in keeping with a partial SBO likely due to edema. Formed stool within nonobstructed large bowel.   Assessment/Plan:  66 y.o.female with slowly resolving post-operative ileus and partial SBO secondary to anastomotic edema +/- chronic constipation POD #6 s/p exploratory laparotomy with extensive adhesiolysis, and segmental small bowel resection for closed loop bowel obstruction with focal segmental gangrene/necrosis attributed to post-hysterectomy adhesions, complicated by pertinent comorbidities including history of cervical cancer, chronic constipation requiring manual self-disimpaction x1 year, chronic urinary retention/pelvic floor laxity relieved with self-applied pelvic pressure, and GERD.  - pain control prn, minimize narcotics - monitor bowel function and abdominal  exam - ice chips and sips of  water prn withPO medications, will reassess diet this evening - routine Tylenol and ibuprofen x2 days to reduce narcotics prn - ambulation encouraged, DVT prophylaxis  All of the above findings and recommendations were discussed with the patient, and all of her questions were answered to her expressed satisfaction.  -- Marilynne Drivers Rosana Hoes, MD, [place]: [place] [facility] Office: [phone]

## 2016-06-29 MED ORDER — OMEPRAZOLE 20 MG PO CPDR
20.0000 mg | DELAYED_RELEASE_CAPSULE | Freq: Every day | ORAL | Status: DC
  Administered 2016-06-29: 20 mg via ORAL

## 2016-06-29 NOTE — Progress Notes (Signed)
Ambulated patient approximately 1500 ft. Pt tolerated ambulation well without complaints of pain or discomfort during ambulation. Will continue ambulation throughout the shift.

## 2016-06-29 NOTE — Progress Notes (Signed)
Pt has reported passing a lot of flatus.

## 2016-06-29 NOTE — Progress Notes (Signed)
ADDENDUM: Patient states that she has been passing flatus has no pain and when she ambulates, but has back pain that radiates somewhat to her abdomen when she sits in a chair or lays in bed for prolonged periods of time. She also reports heartburn that she's previously experienced, but that has been controlled at home with omeprazole. She states the pantoprazole she's been taking in hospital has not controlled her reflux.        - will advance to mechanical soft diet with instructions to chew food well with small portions as tolerated       - patient encouraged to ambulate more than lay in bed and RN to order PT per verbal order if patient appears unsteady       - also okay for patient to take home omeprazole since off-formulary       [place] Hospital Day(s): 7.   Post op day(s): 7 Days Post-Op.   Interval History: Patient seen and examined, reports that she felt better yesterday, but experienced increasing back pain while laying on her back in bed overnight, radiating to her abdomen and requiring narcotics for pain management. She reports she continues to pass flatus and denies fever/chills, CP, or SOB, but reports burning of her throat while swallowing even just water. She reports a history of severe reflux, but states that it has been controlled with once daily omeprazole. She also says the cream soup has made her feel nauseous.  Review of Systems:  Constitutional: denies fever, chills  HEENT: denies cough or congestion  Respiratory: denies any shortness of breath  Cardiovascular: denies chest pain or palpitations  Gastrointestinal: abdominal pain, N/V, and bowel function as per interval history Genitourinary: denies burning with urination or urinary frequency Musculoskeletal: denies pain, decreased motor or sensation Integumentary: denies any other rashes or skin discolorations Neurological: denies HA or vision/hearing changes   Vital signs in last 24 hours: [min-max]  current  Temp:  [98 F (36.7 C)-98.1 F (36.7 C)] 98 F (36.7 C) (01/23 2124) Pulse Rate:  [84-85] 84 (01/23 2124) Resp:  [16-18] 18 (01/23 2124) BP: (128-132)/(69-76) 128/76 (01/23 2124) SpO2:  [100 %] 100 % (01/23 2124)     Height: 5' 1"  (154.9 cm) Weight: 49.9 kg (109 lb 15.5 oz) BMI (Calculated): 20.8   Intake/Output this shift:  Total I/O In: 240 [P.O.:240] Out: -    Intake/Output last 2 shifts:  @IOLAST2SHIFTS @   Physical Exam:  Constitutional: alert, cooperative and no distress  HENT: normocephalic without obvious abnormality  Eyes: PERRL, EOM's grossly intact and symmetric  Neuro: CN II - XII grossly intact and symmetric without deficit  Respiratory: breathing non-labored at rest  Cardiovascular: regular rate and sinus rhythm  Gastrointestinal: soft, minimally tender to even deep palpation, and non-distended with incision well-approximated without erythema or drainage Musculoskeletal: UE and LE FROM, no edema or wounds, motor and sensation grossly intact, NT   Imaging studies: No new pertinent imaging studies   Assessment/Plan:  66 y.o.female with back pain and resolving post-operative ileus and partial SBO secondary to anastomotic edema +/- chronic constipation POD #7s/p exploratory laparotomy with extensive adhesiolysis, and segmental small bowel resection for closed loop bowel obstruction with focal segmental gangrene/necrosis attributed to post-hysterectomy adhesions, complicated by pertinent comorbidities including history of cervical cancer, chronic constipation requiring manual self-disimpaction x1 year, chronic urinary retention/pelvic floor laxity relieved with self-applied pelvic pressure, and GERD.  - pain control prn, minimize narcotics - monitor bowel function and abdominal exam  -  patient feels better when ambulating, ambulation encouraged - continue full liquids diet for now, will advance to soft diet if still passing  flatus and feels better with ambulation - DVT prophylaxis, discharge planning once tolerating diet  All of the above findings and recommendations were discussed with the patient and her daughter, and all of their questions were answered to their expressed satisfaction.  -- Marilynne Drivers Rosana Hoes, MD, [place]: [facility] Office: [phone]

## 2016-06-30 MED ORDER — OXYCODONE HCL 5 MG PO TABS: 5.0000 mg | ORAL_TABLET | ORAL | 0 refills | Status: DC | PRN

## 2016-06-30 MED ORDER — DOCUSATE SODIUM 100 MG PO CAPS: 100.0000 mg | ORAL_CAPSULE | Freq: Two times a day (BID) | ORAL | 0 refills | Status: DC | PRN

## 2016-06-30 MED ORDER — OMEPRAZOLE 20 MG PO CPDR
20.0000 mg | DELAYED_RELEASE_CAPSULE | Freq: Every day | ORAL | Status: DC
  Administered 2016-06-30: 20 mg via ORAL
  Filled 2016-06-30: qty 1

## 2016-06-30 NOTE — Progress Notes (Signed)
Pt's IV catheter removed and intact. Pt's IV site clean dry and intact. Discharge instructions including medications and follow up appointments were reviewed and discussed with patient. Pt verbalized understanding of discharge instructions including medications and follow up appointments. All questions were answered and no further questions at this time. Pt in stable condition and in no acute distress at time of discharge. Pt escorted by RN.

## 2016-06-30 NOTE — Care Management Important Message (Signed)
Important Message  Patient Details  Name: Rebecca Osborne MRN: [id number] Date of Birth: Nov 08, 1950   Medicare Important Message Given:  Yes    Khalfani Weideman, Chauncey [place], RN 06/30/2016, 2:21 PM

## 2016-06-30 NOTE — Progress Notes (Signed)
SURGICAL PROGRESS NOTE (cpt [phone])  Hospital Day(s): 8.   Post op day(s): 8 Days Post-Op.   Interval History: Patient seen and examined, no acute events or new complaints overnight. Patient denies any abdominal pain while walking and has continued to pass flatus and a large BM, tolerating soft diet without N/V, fever/chills, CP, or SOB. She also reports her back pain has improved with walking.  Review of Systems:  Constitutional: denies fever, chills  HEENT: denies cough or congestion  Respiratory: denies any shortness of breath  Cardiovascular: denies chest pain or palpitations  Gastrointestinal: abdominal pain, N/V, and bowel function as per interval history Genitourinary: denies burning with urination or urinary frequency Musculoskeletal: denies pain, decreased motor or sensation Integumentary: denies any other rashes or skin discolorations Neurological: denies HA or vision/hearing changes   Vital signs in last 24 hours: [min-max] current  Temp:  [97.6 F (36.4 C)-98.7 F (37.1 C)] 98.3 F (36.8 C) (01/25 0452) Pulse Rate:  [86-94] 86 (01/25 0452) Resp:  [18] 18 (01/25 0452) BP: (129-139)/(59-74) 135/59 (01/25 0452) SpO2:  [96 %-100 %] 96 % (01/25 0452)     Height: 5' 1"  (154.9 cm) Weight: 49.9 kg (109 lb 15.5 oz) BMI (Calculated): 20.8   Intake/Output this shift:  Total I/O In: 240 [P.O.:240] Out: -    Intake/Output last 2 shifts:  @IOLAST2SHIFTS @   Physical Exam:  Constitutional: alert, cooperative and no distress  HENT: normocephalic without obvious abnormality  Eyes: PERRL, EOM's grossly intact and symmetric  Neuro: CN II - XII grossly intact and symmetric without deficit  Respiratory: breathing non-labored at rest  Cardiovascular: regular rate and sinus rhythm  Gastrointestinal: soft, non-tender, and non-distended, incision well-approximated without erythema or drainage Musculoskeletal: UE and LE FROM, no edema or wounds, motor and sensation grossly intact, NT    Imaging studies: No new pertinent imaging studies   Assessment/Plan:  66 y.o.female with improved back pain and resolvingpost-operative ileus and partial SBO secondary to anastomotic edema +/- chronic constipation POD #8s/p exploratory laparotomy with extensive adhesiolysis, and segmental small bowel resection for closed loop bowel obstruction with focal segmental gangrene/necrosis attributed to post-hysterectomy adhesions, complicated by pertinent comorbidities including history of cervical cancer, chronic constipation requiring manual self-disimpaction x1 year, chronic urinary retention/pelvic floor laxity relieved with self-applied pelvic pressure, and GERD.  - pain control prn, minimize narcotics             - patient feels better when ambulating, ambulation encouraged - continue soft diet and patient advised to gradually advance as tolerated - discharge planning, surgical follow-up in 2 weeks  All of the above findings and recommendations were discussed with the patient and her daughter, and all of their questions were answered to their expressed satisfaction.  -- Marilynne Drivers Rosana Hoes, MD, [place]: [place] [facility] Office: [phone]

## 2016-06-30 NOTE — Discharge Instructions (Signed)
In addition to included general post-operative instructions for Small Bowel Obstruction and Resection with Post-Operative Ileus,  Diet: Gradually resume home heart healthy diet.   Activity: No heavy lifting >20 pounds (children, pets, laundry, garbage) or strenuous activity until follow-up, but light activity and walking are encouraged. Do not drive or drink alcohol if taking narcotic pain medications.  Wound care: Okay to shower/get incision wet with soapy water and pat dry (do not rub incisions), but no baths or submerging incision underwater until follow-up.   Medications: Resume all home medications. For mild to moderate pain: acetaminophen (Tylenol) or ibuprofen (if no kidney disease). Narcotic pain medications, if prescribed, can be used for severe pain, though may cause nausea, constipation, and drowsiness. If you do not need the narcotic pain medication, you do not need to fill the prescription.  Call office [phone]) at any time if any questions, worsening pain, fevers/chills, bleeding, drainage from incision site, or other concerns.

## 2016-06-30 NOTE — Progress Notes (Signed)
Ambulated patient approximately 1500 ft. Pt tolerated ambulation well without complaints of pain or discomfort during ambulation. Will continue ambulation throughout the shift.

## 2016-07-04 NOTE — Discharge Summary (Signed)
Physician Discharge Summary  Patient ID: Rebecca Osborne MRN: [id number] DOB/AGE: [birth year] 66 y.o.  Admit date: 06/22/2016 Discharge date: 07/04/2016  Admission Diagnoses: Closed loop small bowel obstruction  Discharge Diagnoses:  Active Problems: Closed loop small bowel obstruction due to adhesions with 1.5 ft segmental ischemia of involved small intestine  Discharged Condition: good  Hospital Course: 66 year old Female presented to [facility] for acute onset of severe Right-sided lower abdominal pain x 10 hours associated with cessation of flatus/bowel function and nausea. CT of the abdomen and pelvis without PO or IV contrast demonstrated a closed loop SBO, possible internal hernia, for which patient underwent emergent exploratory laparotomy with extensive lysis of adhesions and ~1.5 ft segmental resection of ischemic and overtly necrotic jejunum. Patient tolerated the procedure well. Post-operatively, patient experienced ileus with back pain. Anastomotic edema without leak or abscess was demonstrated on CT with PO contrast, after which patient continued to pass flatus and passed a large BM. Patient's diet was safely advanced, and discharge planning was initiated with appropriate instructions and follow-up.  Consults: None  Significant Diagnostic Studies: radiology: CT scan: CT abdomen and pelvis (pre-op): closed loop small bowel obstruction, post-op: anastomotic edema without leak or abscess  Treatments: surgery: exploratory laparotomy with extensive lysis of adhesions, small bowel resection  Discharge Exam: Blood pressure (!) 135/59, pulse 86, temperature 98.3 F (36.8 C), temperature source Oral, resp. rate 18, height 5' 1"  (1.549 m), weight 49.9 kg (109 lb 15.5 oz), SpO2 96 %. General appearance: alert, cooperative and no distress GI: soft, non-tender; bowel sounds normal; no masses,  no organomegaly, incision well-approximated with no erythema or drainage  Disposition: 01-Home or Self  Care   Allergies as of 06/30/2016      Reactions   Codeine Itching      Medication List    TAKE these medications   aspirin EC 81 MG tablet Take 81 mg by mouth every morning.   calcium elemental as carbonate 400 MG chewable tablet Commonly known as:  BARIATRIC TUMS ULTRA Chew 1,000 mg by mouth daily.   docusate sodium 100 MG capsule Commonly known as:  COLACE Take 1 capsule (100 mg total) by mouth 2 (two) times daily as needed for mild constipation.   ibuprofen 200 MG tablet Commonly known as:  ADVIL,MOTRIN Take 600 mg by mouth every 6 (six) hours as needed for pain.   omeprazole 20 MG capsule Commonly known as:  PRILOSEC Take 20 mg by mouth every morning.   oxyCODONE 5 MG immediate release tablet Commonly known as:  Oxy IR/ROXICODONE Take 1 tablet (5 mg total) by mouth every 4 (four) hours as needed for severe pain.   VITAMIN D3 PO Take 2 tablets by mouth daily.      Follow-up Information    Vickie Epley, MD. Schedule an appointment as soon as possible for a visit in 2 week(s).   Specialty:  General Surgery Contact information: [address] [place] [facility] [address] [phone]           Signed: Vickie Epley 07/04/2016, 10:15 AM

## 2016-08-16 ENCOUNTER — Ambulatory Visit: Payer: PRIVATE HEALTH INSURANCE | Admitting: General Surgery

## 2016-08-23 ENCOUNTER — Ambulatory Visit (INDEPENDENT_AMBULATORY_CARE_PROVIDER_SITE_OTHER): Payer: Self-pay | Admitting: General Surgery

## 2016-08-23 ENCOUNTER — Encounter: Payer: Self-pay | Admitting: General Surgery

## 2016-08-23 VITALS — BP 127/65 | HR 68 | Temp 98.2°F | Resp 18 | Ht 59.0 in | Wt 112.0 lb

## 2016-08-23 DIAGNOSIS — Z09 Encounter for follow-up examination after completed treatment for conditions other than malignant neoplasm: Secondary | ICD-10-CM

## 2016-08-23 NOTE — Progress Notes (Signed)
Subjective:     Rebecca Osborne  Status post partial small bowel resection in the past. Patient has recovered from her surgery. Objective:    BP 127/65   Pulse 68   Temp 98.2 F (36.8 C)   Resp 18   Ht 4' 11"  (1.499 m)   Wt 112 lb (50.8 kg)   BMI 22.62 kg/m   General:  alert, cooperative and no distress  Abdomen soft, flat. Incision well-healed.     Assessment:    Doing well postoperatively.    Plan:  May return to work on 08/29/2016.

## 2016-08-29 ENCOUNTER — Emergency Department (HOSPITAL_COMMUNITY)
Admission: EM | Admit: 2016-08-29 | Discharge: 2016-08-29 | Disposition: A | Discharge status: Home / Self Care | Payer: Medicare Other | Attending: Emergency Medicine | Admitting: Emergency Medicine

## 2016-08-29 ENCOUNTER — Emergency Department (HOSPITAL_COMMUNITY): Payer: Medicare Other

## 2016-08-29 ENCOUNTER — Encounter (HOSPITAL_COMMUNITY): Payer: Self-pay | Admitting: Emergency Medicine

## 2016-08-29 DIAGNOSIS — R110 Nausea: Secondary | ICD-10-CM | POA: Insufficient documentation

## 2016-08-29 DIAGNOSIS — R1031 Right lower quadrant pain: Secondary | ICD-10-CM | POA: Diagnosis not present

## 2016-08-29 DIAGNOSIS — Z7982 Long term (current) use of aspirin: Secondary | ICD-10-CM | POA: Insufficient documentation

## 2016-08-29 DIAGNOSIS — R1030 Lower abdominal pain, unspecified: Secondary | ICD-10-CM

## 2016-08-29 DIAGNOSIS — K5909 Other constipation: Secondary | ICD-10-CM | POA: Insufficient documentation

## 2016-08-29 DIAGNOSIS — R1032 Left lower quadrant pain: Secondary | ICD-10-CM | POA: Diagnosis not present

## 2016-08-29 LAB — CBC WITH DIFFERENTIAL/PLATELET
BASOS ABS: 0 10*3/uL (ref 0.0–0.1)
BASOS PCT: 0 %
EOS ABS: 0.1 10*3/uL (ref 0.0–0.7)
Eosinophils Relative: 1 %
HCT: 35.1 % — ABNORMAL LOW (ref 36.0–46.0)
HEMOGLOBIN: 11.8 g/dL — AB (ref 12.0–15.0)
Lymphocytes Relative: 25 %
Lymphs Abs: 1.3 10*3/uL (ref 0.7–4.0)
MCH: 30.3 pg (ref 26.0–34.0)
MCHC: 33.6 g/dL (ref 30.0–36.0)
MCV: 90.2 fL (ref 78.0–100.0)
Monocytes Absolute: 0.4 10*3/uL (ref 0.1–1.0)
Monocytes Relative: 7 %
NEUTROS ABS: 3.6 10*3/uL (ref 1.7–7.7)
NEUTROS PCT: 67 %
Platelets: 201 10*3/uL (ref 150–400)
RBC: 3.89 MIL/uL (ref 3.87–5.11)
RDW: 13.4 % (ref 11.5–15.5)
WBC: 5.4 10*3/uL (ref 4.0–10.5)

## 2016-08-29 LAB — COMPREHENSIVE METABOLIC PANEL
ALT: 13 U/L — AB (ref 14–54)
ANION GAP: 6 (ref 5–15)
AST: 19 U/L (ref 15–41)
Albumin: 4 g/dL (ref 3.5–5.0)
Alkaline Phosphatase: 70 U/L (ref 38–126)
BILIRUBIN TOTAL: 0.5 mg/dL (ref 0.3–1.2)
BUN: 15 mg/dL (ref 6–20)
CALCIUM: 9.6 mg/dL (ref 8.9–10.3)
CO2: 29 mmol/L (ref 22–32)
CREATININE: 0.71 mg/dL (ref 0.44–1.00)
Chloride: 103 mmol/L (ref 101–111)
Glucose, Bld: 93 mg/dL (ref 65–99)
Potassium: 3.7 mmol/L (ref 3.5–5.1)
Sodium: 138 mmol/L (ref 135–145)
TOTAL PROTEIN: 6.9 g/dL (ref 6.5–8.1)

## 2016-08-29 LAB — URINALYSIS, ROUTINE W REFLEX MICROSCOPIC
BILIRUBIN URINE: NEGATIVE
Bacteria, UA: NONE SEEN
GLUCOSE, UA: NEGATIVE mg/dL
HGB URINE DIPSTICK: NEGATIVE
Ketones, ur: 5 mg/dL — AB
NITRITE: NEGATIVE
PROTEIN: NEGATIVE mg/dL
SPECIFIC GRAVITY, URINE: 1.013 (ref 1.005–1.030)
pH: 6 (ref 5.0–8.0)

## 2016-08-29 LAB — LIPASE, BLOOD: Lipase: 21 U/L (ref 11–51)

## 2016-08-29 MED ORDER — IOPAMIDOL (ISOVUE-300) INJECTION 61%: 100.0000 mL | Freq: Once | INTRAVENOUS | Status: DC | PRN

## 2016-08-29 MED ORDER — ONDANSETRON HCL 4 MG/2ML IJ SOLN
4.0000 mg | Freq: Once | INTRAMUSCULAR | Status: AC
  Administered 2016-08-29: 4 mg via INTRAVENOUS
  Filled 2016-08-29: qty 2

## 2016-08-29 MED ORDER — MORPHINE SULFATE (PF) 4 MG/ML IV SOLN
4.0000 mg | Freq: Once | INTRAVENOUS | Status: AC
  Administered 2016-08-29: 4 mg via INTRAVENOUS
  Filled 2016-08-29: qty 1

## 2016-08-29 MED ORDER — IOPAMIDOL (ISOVUE-300) INJECTION 61%
INTRAVENOUS | Status: AC
  Administered 2016-08-29: 100 mL
  Filled 2016-08-29: qty 30

## 2016-08-29 MED ORDER — POLYETHYLENE GLYCOL 3350 17 G PO PACK: 17.0000 g | PACK | Freq: Every day | ORAL | 0 refills | Status: DC

## 2016-08-29 MED ORDER — SODIUM CHLORIDE 0.9 % IV SOLN
INTRAVENOUS | Status: DC
  Administered 2016-08-29 (×2): via INTRAVENOUS

## 2016-08-29 NOTE — ED Triage Notes (Signed)
PT stated RLQ/LLQ abdominal pain x3 days with last BM yesterday. PT denies any urinary symptoms. PT states she went to Dr. Arnoldo Morale office this am and was told to come to ED. PT states she had a bowel resection surgery this past January.

## 2016-08-29 NOTE — Discharge Instructions (Signed)
Your lab tests and CT scan today are okay, other than moderate constipation.  You may benefit from miralax which has been prescribed for you.  Follow up with your primary doctor if you have any persistent or new symptoms.

## 2016-08-30 NOTE — ED Provider Notes (Signed)
[place] [facility] DEPT Provider Note   CSN: [id number] Arrival date & time: 08/29/16  3846     History   Chief Complaint Chief Complaint  Patient presents with  . Abdominal Pain    HPI Rebecca Osborne is a 66 y.o. 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  Her pain is constant, sharp without relief and is not associated with fevers, chills, distention, vomiting or diarrhea, but does endorse nausea.  She denies abdominal distention.  She has found no alleviators for her pain. Her last bm was yesterday and normal, denies rectal bleeding.  She has been able to tolerate PO intake without worsening sx.  She has had no dysuria, back pain, vaginal discharge.  She was supposed to return to work this week, was released from Dr. Arnoldo Morale care at her last OV with him on the 20th.  She contacted his office but was advised to come here.  The history is provided by the patient.    Past Medical History:  Diagnosis Date  . Allergy   . Cancer (HCC)    cervical cancer cells  . GERD (gastroesophageal reflux disease)   . Osteopenia     Patient Active Problem List   Diagnosis Date Noted  . Small bowel obstruction due to adhesions 06/22/2016  . GERD 08/31/2009  . DYSPHAGIA UNSPECIFIED 08/31/2009    Past Surgical History:  Procedure Laterality Date  . ABDOMINAL HYSTERECTOMY    . BOWEL RESECTION N/A 06/22/2016   Procedure: SMALL BOWEL RESECTION;  Surgeon: Vickie Epley, MD;  Location: [facility];  Service: General;  Laterality: N/A;  . CHOLECYSTECTOMY  2005  . FRACTURE SURGERY     rt leg  . LAPAROTOMY N/A 06/22/2016   Procedure: EXPLORATORY LAPAROTOMY;  Surgeon: Vickie Epley, MD;  Location: [facility];  Service: General;  Laterality: N/A;  . LYSIS OF ADHESION N/A 06/22/2016   Procedure: LYSIS OF ADHESION;  Surgeon: Vickie Epley, MD;  Location: [facility];  Service: General;  Laterality: N/A;    OB History    Gravida Para Term Preterm AB Living             3   SAB TAB Ectopic Multiple Live Births                   Home Medications    Prior to Admission medications   Medication Sig Start Date End Date Taking? Authorizing Provider  aspirin EC 81 MG tablet Take 81 mg by mouth every morning.   Yes Historical Provider, MD  Cholecalciferol (VITAMIN D3 PO) Take 2 tablets by mouth daily.   Yes Historical Provider, MD  docusate sodium (COLACE) 100 MG capsule Take 1 capsule (100 mg total) by mouth 2 (two) times daily as needed for mild constipation. 06/30/16  Yes Vickie Epley, MD  ibuprofen (ADVIL,MOTRIN) 200 MG tablet Take 600 mg by mouth every 6 (six) hours as needed for pain.   Yes Historical Provider, MD  omeprazole (PRILOSEC) 20 MG capsule Take 20 mg by mouth every morning.   Yes Historical Provider, MD  oxyCODONE (OXY IR/ROXICODONE) 5 MG immediate release tablet Take 1 tablet (5 mg total) by mouth every 4 (four) hours as needed for severe pain. Patient not taking: Reported on 08/29/2016 06/30/16   Vickie Epley, MD  polyethylene glycol [facility] / Floria Raveling) packet Take  17 g by mouth daily. 08/29/16   Evalee Jefferson, PA-C    Family History Family History  Problem Relation Age of Onset  . COPD Mother   . Heart disease Mother   . Miscarriages / [place] Mother   . Vision loss Mother     from shingles  . COPD Father   . Cancer Brother     leukemia  . Early death Brother     Social History Social History  Substance Use Topics  . Smoking status: Never Smoker  . Smokeless tobacco: Never Used  . Alcohol use No     Allergies   Codeine   Review of Systems Review of Systems  Constitutional: Negative for chills and fever.  HENT: Negative for congestion and sore throat.   Eyes: Negative.   Respiratory: Negative for chest tightness and shortness of breath.   Cardiovascular: Negative for chest  pain.  Gastrointestinal: Positive for abdominal pain and nausea. Negative for abdominal distention, blood in stool, constipation, diarrhea and vomiting.  Genitourinary: Negative.   Musculoskeletal: Negative for arthralgias, joint swelling and neck pain.  Skin: Negative.  Negative for rash and wound.  Neurological: Negative for dizziness, weakness, light-headedness, numbness and headaches.  Psychiatric/Behavioral: Negative.      Physical Exam Updated Vital Signs BP 137/74 (BP Location: Right Arm)   Pulse 67   Temp 97.8 F (36.6 C) (Oral)   Resp 16   Ht 4' 11"  (1.499 m)   Wt 50.8 kg   SpO2 100%   BMI 22.62 kg/m   Physical Exam  Constitutional: She appears well-developed and well-nourished.  HENT:  Head: Normocephalic and atraumatic.  Eyes: Conjunctivae are normal.  Neck: Normal range of motion.  Cardiovascular: Normal rate, regular rhythm, normal heart sounds and intact distal pulses.   Pulmonary/Chest: Effort normal and breath sounds normal. She has no wheezes.  Abdominal: Soft. Bowel sounds are normal. She exhibits no distension and no mass. There is tenderness in the right lower quadrant and left lower quadrant. There is no rebound, no guarding and no tenderness at McBurney's point. No hernia.  Musculoskeletal: Normal range of motion.  Neurological: She is alert.  Skin: Skin is warm and dry.  Psychiatric: She has a normal mood and affect.  Nursing note and vitals reviewed.    ED Treatments / Results  Labs (all labs ordered are listed, but only abnormal results are displayed) Labs Reviewed  COMPREHENSIVE METABOLIC PANEL - Abnormal; Notable for the following:       Result Value   ALT 13 (*)    All other components within normal limits  CBC WITH DIFFERENTIAL/PLATELET - Abnormal; Notable for the following:    Hemoglobin 11.8 (*)    HCT 35.1 (*)    All other components within normal limits  URINALYSIS, ROUTINE W REFLEX MICROSCOPIC - Abnormal; Notable for the following:     Ketones, ur 5 (*)    Leukocytes, UA SMALL (*)    Squamous Epithelial / LPF 0-5 (*)    All other components within normal limits  LIPASE, BLOOD    EKG  EKG Interpretation None       Radiology Ct Abdomen Pelvis W Contrast  Result Date: 08/29/2016 CLINICAL DATA:  Bilateral lower abdominal pain for 3 days. History of bowel resection history of partial jejunal resection and lysis of adhesions 06/23/2016. EXAM: CT ABDOMEN AND PELVIS WITH CONTRAST TECHNIQUE: Multidetector CT imaging of the abdomen and pelvis was performed using the standard protocol following bolus administration of intravenous contrast.  CONTRAST:  100 ml  ISOVUE-300 IOPAMIDOL (ISOVUE-300) INJECTION 61% COMPARISON:  CT abdomen and pelvis 06/28/2016 and 06/22/2016. FINDINGS: Lower chest: Lung bases are clear. No pleural or pericardial effusion. Hepatobiliary: The liver is somewhat low attenuating consistent with fatty infiltration. No focal lesion. The gallbladder is been removed. Dilatation of the common bile duct at 0.9 cm is unchanged. Pancreas: Unremarkable. No pancreatic ductal dilatation or surrounding inflammatory changes. Spleen: Normal in size without focal abnormality. Adrenals/Urinary Tract: Single small bilateral renal cysts are unchanged. There is some scarring in the upper pole of the right kidney. Punctate nonobstructing stone lower pole right kidney is identified, unchanged. No hydronephrosis or ureteral stone. Urinary bladder appears normal. The adrenal glands are unremarkable. Stomach/Bowel: Left lower quadrant small bowel anastomosis is identified. There are some fecal type small bowel contents just proximal to the anastomosis. Small bowel loops proximal to the anastomosis are at the upper limits of normal measuring 2.9 cm. Contrast does pass into small bowel loops distal to the anastomosis. Gas and stool are present throughout the colon. Vascular/Lymphatic: Scattered aortoiliac atherosclerotic calcifications are  identified. Reproductive: Status post hysterectomy.  No adnexal mass. Other: No fluid collection. Musculoskeletal: No lytic or sclerotic lesion. IMPRESSION: Status post resection of the distal jejunum with a surgical anastomosis in the left lower quadrant. There is mild dilatation of small bowel loops proximal to the anastomosis and fecal contents within small bowel at the anastomosis compatible with slow transit or mild partial obstruction. Contrast does pass beyond the anastomosis and there is gas and stool throughout the colon. Fatty infiltration of liver. Status post cholecystectomy and hysterectomy. Punctate nonobstructing stone lower pole right kidney. Atherosclerosis. Electronically Signed   By: Inge Rise M.D.   On: 08/29/2016 13:55   Dg Abd 2 Views  Result Date: 08/29/2016 CLINICAL DATA:  Lower abdominal pain for 3 days. Prior abdominal surgery in January. Tenderness in constipation. EXAM: ABDOMEN - 2 VIEW COMPARISON:  06/28/2016 FINDINGS: Prominent stool throughout the colon favors constipation. Several mildly dilated loops of central abdominal small bowel without well-defined air-fluid levels. The density projecting over the right renal shadow noted, although this resembles a renal calculus there is no large renal calculus of this size on the prior CT from 2 months ago, and this could simply represent dense material in the fecal stream. IMPRESSION: 1. Prominence of stool in the colon, with some mildly dilated central abdominal loops of small bowel without obvious air-fluid levels. Abnormal but nonspecific bowel gas pattern. This could simply be from constipation and mild central small bowel ileus given that there are no specific indicators of mechanical obstruction, but a low threshold for reimaging by CT is suggested. Electronically Signed   By: Van Clines M.D.   On: 08/29/2016 10:11    Procedures Procedures (including critical care time)  Medications Ordered in ED Medications    iopamidol (ISOVUE-300) 61 % injection (100 mLs  Contrast Given 08/29/16 1320)  ondansetron (ZOFRAN) injection 4 mg (4 mg Intravenous Given 08/29/16 1153)  morphine 4 MG/ML injection 4 mg (4 mg Intravenous Given 08/29/16 1153)     Initial Impression / Assessment and Plan / ED Course  I have reviewed the triage vital signs and the nursing notes.  Pertinent labs & imaging results that were available during my care of the patient were reviewed by me and considered in my medical decision making (see chart for details).    Discussed pt and todays findings with Dr. Arnoldo Morale.  No concern for post surgical complication.  Pt with constipation, suspected source of discomfort.  She has been taking colace.  Suggested switch to miralax which was prescribed.  Pt can f/u with pcp prn if sx are not improved with this treatment.  Also discussed return precautions, worse pain, distention, fevers, vomiting.   Final Clinical Impressions(s) / ED Diagnoses   Final diagnoses:  Lower abdominal pain  Other constipation    New Prescriptions Discharge Medication List as of 08/29/2016  3:26 PM    START taking these medications   Details  polyethylene glycol (MIRALAX / GLYCOLAX) packet Take 17 g by mouth daily., Starting Mon 08/29/2016, Print         Evalee Jefferson, PA-C 08/31/16 [place], DO 09/02/16 1658

## 2016-09-07 ENCOUNTER — Observation Stay (HOSPITAL_COMMUNITY)
Admission: EM | Admit: 2016-09-07 | Discharge: 2016-09-08 | Disposition: A | Discharge status: Home / Self Care | Payer: Medicare Other | Attending: Surgery | Admitting: Surgery

## 2016-09-07 ENCOUNTER — Encounter (HOSPITAL_COMMUNITY): Payer: Self-pay | Admitting: *Deleted

## 2016-09-07 ENCOUNTER — Emergency Department (HOSPITAL_COMMUNITY): Payer: Medicare Other

## 2016-09-07 DIAGNOSIS — K5651 Intestinal adhesions [bands], with partial obstruction: Secondary | ICD-10-CM | POA: Diagnosis not present

## 2016-09-07 DIAGNOSIS — Z23 Encounter for immunization: Secondary | ICD-10-CM | POA: Insufficient documentation

## 2016-09-07 DIAGNOSIS — K565 Intestinal adhesions [bands], unspecified as to partial versus complete obstruction: Secondary | ICD-10-CM | POA: Diagnosis present

## 2016-09-07 DIAGNOSIS — K56699 Other intestinal obstruction unspecified as to partial versus complete obstruction: Secondary | ICD-10-CM | POA: Diagnosis not present

## 2016-09-07 DIAGNOSIS — Z7982 Long term (current) use of aspirin: Secondary | ICD-10-CM | POA: Insufficient documentation

## 2016-09-07 DIAGNOSIS — R109 Unspecified abdominal pain: Secondary | ICD-10-CM | POA: Diagnosis not present

## 2016-09-07 DIAGNOSIS — N281 Cyst of kidney, acquired: Secondary | ICD-10-CM | POA: Diagnosis not present

## 2016-09-07 DIAGNOSIS — K56609 Unspecified intestinal obstruction, unspecified as to partial versus complete obstruction: Secondary | ICD-10-CM

## 2016-09-07 DIAGNOSIS — R1013 Epigastric pain: Secondary | ICD-10-CM | POA: Diagnosis present

## 2016-09-07 LAB — URINALYSIS, ROUTINE W REFLEX MICROSCOPIC
BILIRUBIN URINE: NEGATIVE
Glucose, UA: NEGATIVE mg/dL
HGB URINE DIPSTICK: NEGATIVE
KETONES UR: NEGATIVE mg/dL
NITRITE: NEGATIVE
Protein, ur: NEGATIVE mg/dL
SPECIFIC GRAVITY, URINE: 1.045 — AB (ref 1.005–1.030)
pH: 8 (ref 5.0–8.0)

## 2016-09-07 LAB — COMPREHENSIVE METABOLIC PANEL
ALBUMIN: 4.3 g/dL (ref 3.5–5.0)
ALK PHOS: 74 U/L (ref 38–126)
ALT: 12 U/L — ABNORMAL LOW (ref 14–54)
ANION GAP: 8 (ref 5–15)
AST: 19 U/L (ref 15–41)
BILIRUBIN TOTAL: 0.5 mg/dL (ref 0.3–1.2)
BUN: 15 mg/dL (ref 6–20)
CALCIUM: 9.8 mg/dL (ref 8.9–10.3)
CO2: 29 mmol/L (ref 22–32)
Chloride: 103 mmol/L (ref 101–111)
Creatinine, Ser: 0.72 mg/dL (ref 0.44–1.00)
GFR calc non Af Amer: >60 mL/min (ref >60)
GLUCOSE: 131 mg/dL — AB (ref 65–99)
POTASSIUM: 3.3 mmol/L — AB (ref 3.5–5.1)
Sodium: 140 mmol/L (ref 135–145)
TOTAL PROTEIN: 7.5 g/dL (ref 6.5–8.1)

## 2016-09-07 LAB — CBC
HEMATOCRIT: 37.2 % (ref 36.0–46.0)
HEMOGLOBIN: 12.8 g/dL (ref 12.0–15.0)
MCH: 30.7 pg (ref 26.0–34.0)
MCHC: 34.4 g/dL (ref 30.0–36.0)
MCV: 89.2 fL (ref 78.0–100.0)
Platelets: 273 10*3/uL (ref 150–400)
RBC: 4.17 MIL/uL (ref 3.87–5.11)
RDW: 13.2 % (ref 11.5–15.5)
WBC: 9.7 10*3/uL (ref 4.0–10.5)

## 2016-09-07 LAB — LIPASE, BLOOD: Lipase: 18 U/L (ref 11–51)

## 2016-09-07 MED ORDER — SODIUM CHLORIDE 0.9 % IV BOLUS (SEPSIS)
1000.0000 mL | Freq: Once | INTRAVENOUS | Status: AC
  Administered 2016-09-07: 1000 mL via INTRAVENOUS

## 2016-09-07 MED ORDER — ONDANSETRON HCL 4 MG/2ML IJ SOLN
4.0000 mg | Freq: Once | INTRAMUSCULAR | Status: AC
  Administered 2016-09-07: 4 mg via INTRAVENOUS
  Filled 2016-09-07: qty 2

## 2016-09-07 MED ORDER — IOPAMIDOL (ISOVUE-300) INJECTION 61%
INTRAVENOUS | Status: AC
  Administered 2016-09-07: 30 mL
  Filled 2016-09-07: qty 30

## 2016-09-07 MED ORDER — PNEUMOCOCCAL VAC POLYVALENT 25 MCG/0.5ML IJ INJ
0.5000 mL | INJECTION | INTRAMUSCULAR | Status: AC
  Administered 2016-09-08: 0.5 mL via INTRAMUSCULAR
  Filled 2016-09-07: qty 0.5

## 2016-09-07 MED ORDER — HYDROMORPHONE HCL 1 MG/ML IJ SOLN
1.0000 mg | Freq: Once | INTRAMUSCULAR | Status: AC
  Administered 2016-09-07: 1 mg via INTRAVENOUS
  Filled 2016-09-07: qty 1

## 2016-09-07 MED ORDER — KCL IN DEXTROSE-NACL 20-5-0.45 MEQ/L-%-% IV SOLN
INTRAVENOUS | Status: DC
  Administered 2016-09-07 – 2016-09-08 (×2): via INTRAVENOUS

## 2016-09-07 MED ORDER — ENOXAPARIN SODIUM 40 MG/0.4ML ~~LOC~~ SOLN
40.0000 mg | SUBCUTANEOUS | Status: DC
Start: 2016-09-07 — End: 2016-09-08
  Administered 2016-09-07: 40 mg via SUBCUTANEOUS
  Filled 2016-09-07: qty 0.4

## 2016-09-07 MED ORDER — PANTOPRAZOLE SODIUM 40 MG IV SOLR
40.0000 mg | Freq: Once | INTRAVENOUS | Status: AC
  Administered 2016-09-07: 40 mg via INTRAVENOUS
  Filled 2016-09-07: qty 40

## 2016-09-07 MED ORDER — IOPAMIDOL (ISOVUE-300) INJECTION 61%
100.0000 mL | Freq: Once | INTRAVENOUS | Status: AC | PRN
  Administered 2016-09-07: 100 mL via INTRAVENOUS

## 2016-09-07 NOTE — ED Provider Notes (Signed)
[place] DEPT Provider Note   CSN: [id number] Arrival date & time: 09/07/16  6301 By signing my name below, I, Georgette Shell, attest that this documentation has been prepared under the direction and in the presence of Milton Ferguson, MD. Electronically Signed: Georgette Shell, ED Scribe. 09/07/16. 8:22 AM.  History   Chief Complaint Chief Complaint  Patient presents with  . Abdominal Pain    HPI The history is provided by the patient. No language interpreter was used.  Abdominal Pain   This is a new problem. The current episode started yesterday. The problem occurs constantly. The problem has not changed since onset.The pain is located in the epigastric region. The pain is moderate. Associated symptoms include nausea and constipation. Pertinent negatives include fever, diarrhea, vomiting, frequency, hematuria and headaches. Nothing aggravates the symptoms. Nothing relieves the symptoms. Past workup includes surgery. Her past medical history is significant for GERD.   HPI Comments: Rebecca Osborne is a 66 y.o. female with h/o cervical cancer, GERD, and SBO, who presents to the Emergency Department complaining of generalized abdominal pain beginning last night. Pt also has associated nausea. She further notes that she has been constipated recently but has made several small and hard bowel movements this morning. Pt was seen here for the same symptoms on on the 08/29/16 and prescribed Miralax which gave her moderate relief until last night. She reports she had an intestinal surgery in January 2018 and is concerned her symptoms are related. Pt denies fever, chills, vomiting, or any other associated symptoms.  PCP: Odette Fraction, MD  Past Medical History:  Diagnosis Date  . Allergy   . Cancer (HCC)    cervical cancer cells  . GERD (gastroesophageal reflux disease)   . Osteopenia     Patient Active Problem List   Diagnosis Date Noted  . Small bowel obstruction due to adhesions 06/22/2016  .  GERD 08/31/2009  . DYSPHAGIA UNSPECIFIED 08/31/2009    Past Surgical History:  Procedure Laterality Date  . ABDOMINAL HYSTERECTOMY    . BOWEL RESECTION N/A 06/22/2016   Procedure: SMALL BOWEL RESECTION;  Surgeon: Vickie Epley, MD;  Location: [facility];  Service: General;  Laterality: N/A;  . CHOLECYSTECTOMY  2005  . FRACTURE SURGERY     rt leg  . LAPAROTOMY N/A 06/22/2016   Procedure: EXPLORATORY LAPAROTOMY;  Surgeon: Vickie Epley, MD;  Location: [facility];  Service: General;  Laterality: N/A;  . LYSIS OF ADHESION N/A 06/22/2016   Procedure: LYSIS OF ADHESION;  Surgeon: Vickie Epley, MD;  Location: [facility];  Service: General;  Laterality: N/A;    OB History    Gravida Para Term Preterm AB Living             3   SAB TAB Ectopic Multiple Live Births                   Home Medications    Prior to Admission medications   Medication Sig Start Date End Date Taking? Authorizing Provider  aspirin EC 81 MG tablet Take 81 mg by mouth every morning.    Historical Provider, MD  Cholecalciferol (VITAMIN D3 PO) Take 2 tablets by mouth daily.    Historical Provider, MD  docusate sodium (COLACE) 100 MG capsule Take 1 capsule (100 mg total) by mouth 2 (two) times daily as needed for mild constipation. 06/30/16   Vickie Epley, MD  ibuprofen (ADVIL,MOTRIN) 200 MG tablet Take 600 mg by mouth every 6 (six) hours  as needed for pain.    Historical Provider, MD  omeprazole (PRILOSEC) 20 MG capsule Take 20 mg by mouth every morning.    Historical Provider, MD  oxyCODONE (OXY IR/ROXICODONE) 5 MG immediate release tablet Take 1 tablet (5 mg total) by mouth every 4 (four) hours as needed for severe pain. Patient not taking: Reported on 08/29/2016 06/30/16   Vickie Epley, MD  polyethylene glycol [facility] / Floria Raveling) packet Take 17 g by mouth daily. 08/29/16   Evalee Jefferson, PA-C    Family History Family History  Problem Relation Age of Onset  . COPD Mother   . Heart disease Mother   .  Miscarriages / [place] Mother   . Vision loss Mother     from shingles  . COPD Father   . Cancer Brother     leukemia  . Early death Brother     Social History Social History  Substance Use Topics  . Smoking status: Never Smoker  . Smokeless tobacco: Never Used  . Alcohol use No     Allergies   Codeine   Review of Systems Review of Systems  Constitutional: Negative for appetite change, chills, fatigue and fever.  HENT: Negative for congestion, ear discharge and sinus pressure.   Eyes: Negative for discharge.  Respiratory: Negative for cough.   Cardiovascular: Negative for chest pain.  Gastrointestinal: Positive for abdominal pain, constipation and nausea. Negative for diarrhea and vomiting.  Genitourinary: Negative for frequency and hematuria.  Musculoskeletal: Negative for back pain.  Skin: Negative for rash.  Neurological: Negative for seizures and headaches.  Psychiatric/Behavioral: Negative for hallucinations.   Physical Exam Updated Vital Signs BP (!) 163/83 (BP Location: Right Arm)   Pulse 70   Temp 97.5 F (36.4 C) (Oral)   Resp 18   Ht 5' 1"  (1.549 m)   Wt 112 lb (50.8 kg)   SpO2 96%   BMI 21.16 kg/m   Physical Exam  Constitutional: She is oriented to person, place, and time. She appears well-developed.  HENT:  Head: Normocephalic.  Eyes: Conjunctivae and EOM are normal. No scleral icterus.  Neck: Neck supple. No thyromegaly present.  Cardiovascular: Normal rate and regular rhythm.  Exam reveals no gallop and no friction rub.   No murmur heard. Pulmonary/Chest: No stridor. She has no wheezes. She has no rales. She exhibits no tenderness.  Abdominal: She exhibits no distension. There is tenderness in the epigastric area. There is no rebound.  Moderate epigastric tenderness.   Musculoskeletal: Normal range of motion. She exhibits no edema.  Lymphadenopathy:    She has no cervical adenopathy.  Neurological: She is oriented to person, place, and  time. She exhibits normal muscle tone. Coordination normal.  Skin: No rash noted. No erythema.  Psychiatric: She has a normal mood and affect. Her behavior is normal.  Nursing note and vitals reviewed.    ED Treatments / Results  DIAGNOSTIC STUDIES: Oxygen Saturation is 96% on RA, adequate by my interpretation.   COORDINATION OF CARE: 8:19 AM -Discussed next steps with pt. Pt verbalized understanding and is agreeable with the plan.   Labs (all labs ordered are listed, but only abnormal results are displayed) Labs Reviewed  CBC  LIPASE, BLOOD  COMPREHENSIVE METABOLIC PANEL  URINALYSIS, ROUTINE W REFLEX MICROSCOPIC    EKG  EKG Interpretation None       Radiology No results found.  Procedures Procedures (including critical care time)  Medications Ordered in ED Medications - No data to display  Initial Impression / Assessment and Plan / ED Course  I have reviewed the triage vital signs and the nursing notes.  Pertinent labs & imaging results that were available during my care of the patient were reviewed by me and considered in my medical decision making (see chart for details).     Patient with small bowel obstruction. Surgery will come see the patient  Final Clinical Impressions(s) / ED Diagnoses   Final diagnoses:  None    New Prescriptions New Prescriptions   No medications on file   The chart was scribed for me under my direct supervision.  I personally performed the history, physical, and medical decision making and all procedures in the evaluation of this patient.Milton Ferguson, MD 09/07/16 1400

## 2016-09-07 NOTE — Consult Note (Addendum)
SURGICAL CONSULTATION NOTE (initial)  HISTORY OF PRESENT ILLNESS (HPI):  66 y.o. female presented to [facility] ED early this morning with severe "10 out of 10" mid-abdominal pain and nausea that began after she ate a pimento cheese sandwich last night. 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 Since undergoing CT this morning and receiving 1 mg of Dilaudid IV this morning at 8:42 am, patient reports her pain has resolved and she feels less distended/bloated. Patient otherwise denies emesis, fever/chills, CP, or SOB and denies any similar problems since surgery (though another CT was performed 08/29/2016).  Surgery is consulted by ED physician Dr. Roderic Palau in this context for evaluation and management of small bowel obstruction.  PAST MEDICAL HISTORY (PMH):  Past Medical History:  Diagnosis Date  . Allergy   . Cancer (HCC)    cervical cancer cells  . GERD (gastroesophageal reflux disease)   . Osteopenia      PAST SURGICAL HISTORY ([place]):  Past Surgical History:  Procedure Laterality Date  . ABDOMINAL HYSTERECTOMY    . BOWEL RESECTION N/A 06/22/2016   Procedure: SMALL BOWEL RESECTION;  Surgeon: Vickie Epley, MD;  Location: [facility];  Service: General;  Laterality: N/A;  . CHOLECYSTECTOMY  2005  . FRACTURE SURGERY     rt leg  . LAPAROTOMY N/A 06/22/2016   Procedure: EXPLORATORY LAPAROTOMY;  Surgeon: Vickie Epley, MD;  Location: [facility];  Service: General;  Laterality: N/A;  . LYSIS OF ADHESION N/A 06/22/2016   Procedure: LYSIS OF ADHESION;  Surgeon: Vickie Epley, MD;  Location: [facility];  Service: General;  Laterality: N/A;     MEDICATIONS:  Prior to Admission medications   Medication Sig Start Date End Date Taking?  Authorizing Provider  aspirin EC 81 MG tablet Take 81 mg by mouth every morning.   Yes Historical Provider, MD  Cholecalciferol (VITAMIN D3 PO) Take 2 tablets by mouth daily.   Yes Historical Provider, MD  docusate sodium (COLACE) 100 MG capsule Take 1 capsule (100 mg total) by mouth 2 (two) times daily as needed for mild constipation. 06/30/16  Yes Vickie Epley, MD  ibuprofen (ADVIL,MOTRIN) 200 MG tablet Take 600 mg by mouth every 6 (six) hours as needed for pain.   Yes Historical Provider, MD  omeprazole (PRILOSEC) 20 MG capsule Take 20 mg by mouth every morning.    Historical Provider, MD  oxyCODONE (OXY IR/ROXICODONE) 5 MG immediate release tablet Take 1 tablet (5 mg total) by mouth every 4 (four) hours as needed for severe pain. Patient not taking: Reported on 08/29/2016 06/30/16   Vickie Epley, MD  polyethylene glycol [facility] / Floria Raveling) packet Take 17 g by mouth daily. Patient not taking: Reported on 09/07/2016 08/29/16   Evalee Jefferson, PA-C     ALLERGIES:  Allergies  Allergen Reactions  . Codeine Itching     SOCIAL HISTORY:  Social History   Social History  . Marital status: Divorced    Spouse name: N/A  . Number of children: N/A  . Years of education: N/A   Occupational History  . Not on file.   Social History Main Topics  . Smoking status: Never Smoker  . Smokeless tobacco: Never Used  . Alcohol use No  . Drug use: No  .  Sexual activity: Not Currently   Other Topics Concern  . Not on file   Social History Narrative  . No narrative on file    The patient currently resides (home / rehab facility / nursing home): Home  The patient normally is (ambulatory / bedbound): Ambulatory   FAMILY HISTORY:  Family History  Problem Relation Age of Onset  . COPD Mother   . Heart disease Mother   . Miscarriages / [place] Mother   . Vision loss Mother     from shingles  . COPD Father   . Cancer Brother     leukemia  . Early death Brother     REVIEW OF SYSTEMS:   Constitutional: denies weight loss, fever, chills, or sweats  Eyes: denies any other vision changes, history of eye injury  ENT: denies sore throat, hearing problems  Respiratory: denies shortness of breath, wheezing  Cardiovascular: denies chest pain, palpitations  Gastrointestinal: abdominal pain, N/V, and bowel function as per HPI Genitourinary: denies burning with urination or urinary frequency Musculoskeletal: denies any other joint pains or cramps  Skin: denies any other rashes or skin discolorations  Neurological: denies any other headache, dizziness, weakness  Psychiatric: denies any other depression, anxiety   All other review of systems were negative   VITAL SIGNS:  Temp:  [97.5 F (36.4 C)] 97.5 F (36.4 C) (04/04 0809) Pulse Rate:  [62-71] 64 (04/04 1430) Resp:  [17-18] 17 (04/04 1423) BP: (98-163)/(60-87) 111/61 (04/04 1430) SpO2:  [92 %-100 %] 100 % (04/04 1430) Weight:  [50.8 kg (112 lb)] 50.8 kg (112 lb) (04/04 0807)     Height: 5' 1"  (154.9 cm) Weight: 50.8 kg (112 lb) BMI (Calculated): 21.2   INTAKE/OUTPUT:  This shift: No intake/output data recorded.  Last 2 shifts: @IOLAST2SHIFTS @   PHYSICAL EXAM:  Constitutional:  -- Normal body habitus  -- Awake, alert, and oriented x3  Eyes:  -- Pupils equally round and reactive to light  -- No scleral icterus  Ear, nose, and throat:  -- No jugular venous distension  Pulmonary:  -- No crackles  -- Equal breath sounds bilaterally -- Breathing non-labored at rest Cardiovascular:  -- S1, S2 present  -- No pericardial rubs Gastrointestinal:  -- Abdomen soft, nontender, not appreciably distended, no guarding/rebound  -- No abdominal masses appreciated, pulsatile or otherwise; well healed midline linear surgical incision Musculoskeletal and Integumentary:  -- Wounds or skin discoloration: None appreciated -- Extremities: B/L UE and LE FROM, hands and feet warm, no edema  Neurologic:  -- Motor function: intact and  symmetric -- Sensation: intact and symmetric  Labs:  CBC:  Lab Results  Component Value Date   WBC 9.7 09/07/2016   RBC 4.17 09/07/2016   BMP:  Lab Results  Component Value Date   GLUCOSE 131 (H) 09/07/2016   CO2 29 09/07/2016   BUN 15 09/07/2016   CREATININE 0.72 09/07/2016   CREATININE 0.80 07/14/2015   CALCIUM 9.8 09/07/2016     Imaging studies:  Mild distension of the stomach with some debris and oral contrast material. Contrast material noted in mild dilated proximal and mid small bowel loops. There are fluid distended distal small bowel loops in mid lower abdomen and pelvis. In axial image 65 there is transition point in caliber of small bowel wall which is completely collapsed beyond this point. Findings consistent with small bowel obstruction probable due to adhesions. Again noted postsurgical changes in small bowel within midline upper pelvis presacral region without evidence of anastomotic stricture.  Mild distension of small bowel at this level with fluid. There is some colonic stool and gas within right colon and transverse colon. Some colonic stool noted within the descending colon. The sigmoid colon is empty collapsed. The terminal ileum is empty collapsed. No any contrast material noted within distal small bowel or within colon.  Small free fluid is noted within the pelvis adjacent to a mild distended small bowel loop on axial image 65. No evidence of small bowel perforation or free abdominal air. Midline anterior abdominal wall scarring is noted. Stable bilateral renal cysts. Stable cortical  scarring right kidney upper pole and midpole. No hydronephrosis  or hydroureter. Status post hysterectomy.  Assessment/Plan: (ICD-10's: K35.51) 66 y.o. female with resolving likely partial small bowel obstruction, complicated by pertinent comorbidities including prior hysterectomy for cervical cancer, chronic constipation requiring manual self-disimpaction >1 year,  chronic urinary retention/pelvic floor laxity relieved with self-applied pelvic pressure, and GERD.   - NPO, IVF  - no narcotics or anti-emetics ordered  - will hold off NG tube as patient reports flatus, hasn't required pain medication since this morning, and appears less distended   - if continues to improve, will advance to clears in the morning and then as tolerated with discharge planning tomorrow  - ambulation encouraged   - DVT prophylaxis  All of the above findings and recommendations were discussed with the patient and ED physician, and all of patient's questions were answered to her expressed satisfaction.  Thank you for the opportunity to participate in this patient's care.   -- Marilynne Drivers Rosana Hoes, MD, [place]: [facility] Office: [phone]

## 2016-09-07 NOTE — ED Triage Notes (Signed)
Pt comes in with RLQ pain starting last night. Pt has nausea, denies vomiting. Pt states she has been constipated but made several small and hard bm's this morning. Pt was seen here for the same last Monday. She adds she had an intestinal surgery in January.

## 2016-09-07 NOTE — ED Notes (Signed)
Attempted to call report x 1, RN unavailable at this time.

## 2016-09-07 NOTE — ED Notes (Signed)
ED Provider at bedside. 

## 2016-09-07 NOTE — ED Notes (Signed)
Report given to [facility]

## 2016-09-07 NOTE — H&P (Signed)
SURGICAL ADMISSION HISTORY & PHYSICAL - cpt: [phone]  HISTORY OF PRESENT ILLNESS (HPI):  66 y.o. female presented to [facility] ED early this morning with severe "10 out of 10" mid-abdominal pain and nausea that began after she ate a pimento cheese sandwich last night. 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 Since undergoing CT this morning and receiving 1 mg of Dilaudid IV this morning at 8:42 am, patient reports her pain has resolved and she feels less distended/bloated. Patient otherwise denies emesis, fever/chills, CP, or SOB and denies any similar problems since surgery (though another CT was performed 08/29/2016).  Surgery is consulted by ED physician Dr. Roderic Palau in this context for evaluation and management of small bowel obstruction.  PAST MEDICAL HISTORY (PMH):  Past Medical History:  Diagnosis Date  . Allergy   . Cancer (HCC)    cervical cancer cells  . GERD (gastroesophageal reflux disease)   . Osteopenia      PAST SURGICAL HISTORY ([place]):  Past Surgical History:  Procedure Laterality Date  . ABDOMINAL HYSTERECTOMY    . BOWEL RESECTION N/A 06/22/2016   Procedure: SMALL BOWEL RESECTION;  Surgeon: Vickie Epley, MD;  Location: [facility];  Service: General;  Laterality: N/A;  . CHOLECYSTECTOMY  2005  . FRACTURE SURGERY     rt leg  . LAPAROTOMY N/A 06/22/2016   Procedure: EXPLORATORY LAPAROTOMY;  Surgeon: Vickie Epley, MD;  Location: [facility];  Service: General;  Laterality: N/A;  . LYSIS OF ADHESION N/A 06/22/2016   Procedure: LYSIS OF ADHESION;  Surgeon: Vickie Epley, MD;  Location: [facility];  Service: General;  Laterality: N/A;     MEDICATIONS:  Prior to Admission medications   Medication Sig Start Date End Date  Taking? Authorizing Provider  aspirin EC 81 MG tablet Take 81 mg by mouth every morning.   Yes Historical Provider, MD  Cholecalciferol (VITAMIN D3 PO) Take 2 tablets by mouth daily.   Yes Historical Provider, MD  docusate sodium (COLACE) 100 MG capsule Take 1 capsule (100 mg total) by mouth 2 (two) times daily as needed for mild constipation. 06/30/16  Yes Vickie Epley, MD  ibuprofen (ADVIL,MOTRIN) 200 MG tablet Take 600 mg by mouth every 6 (six) hours as needed for pain.   Yes Historical Provider, MD  omeprazole (PRILOSEC) 20 MG capsule Take 20 mg by mouth every morning.    Historical Provider, MD  oxyCODONE (OXY IR/ROXICODONE) 5 MG immediate release tablet Take 1 tablet (5 mg total) by mouth every 4 (four) hours as needed for severe pain. Patient not taking: Reported on 08/29/2016 06/30/16   Vickie Epley, MD  polyethylene glycol [facility] / Floria Raveling) packet Take 17 g by mouth daily. Patient not taking: Reported on 09/07/2016 08/29/16   Evalee Jefferson, PA-C     ALLERGIES:  Allergies  Allergen Reactions  . Codeine Itching     SOCIAL HISTORY:  Social History   Social History  . Marital status: Divorced    Spouse name: N/A  . Number of children: N/A  . Years of education: N/A   Occupational History  . Not on file.   Social History Main Topics  . Smoking status: Never Smoker  . Smokeless tobacco: Never Used  . Alcohol use No  . Drug  use: No  . Sexual activity: Not Currently   Other Topics Concern  . Not on file   Social History Narrative  . No narrative on file    The patient currently resides (home / rehab facility / nursing home): Home  The patient normally is (ambulatory / bedbound): Ambulatory   FAMILY HISTORY:  Family History  Problem Relation Age of Onset  . COPD Mother   . Heart disease Mother   . Miscarriages / [place] Mother   . Vision loss Mother     from shingles  . COPD Father   . Cancer Brother     leukemia  . Early death Brother     REVIEW OF  SYSTEMS:  Constitutional: denies weight loss, fever, chills, or sweats  Eyes: denies any other vision changes, history of eye injury  ENT: denies sore throat, hearing problems  Respiratory: denies shortness of breath, wheezing  Cardiovascular: denies chest pain, palpitations  Gastrointestinal: abdominal pain, N/V, and bowel function as per HPI Genitourinary: denies burning with urination or urinary frequency Musculoskeletal: denies any other joint pains or cramps  Skin: denies any other rashes or skin discolorations  Neurological: denies any other headache, dizziness, weakness  Psychiatric: denies any other depression, anxiety   All other review of systems were negative   VITAL SIGNS:  Temp:  [97.5 F (36.4 C)] 97.5 F (36.4 C) (04/04 0809) Pulse Rate:  [62-71] 64 (04/04 1430) Resp:  [17-18] 17 (04/04 1423) BP: (98-163)/(60-87) 111/61 (04/04 1430) SpO2:  [92 %-100 %] 100 % (04/04 1430) Weight:  [50.8 kg (112 lb)] 50.8 kg (112 lb) (04/04 0807)     Height: 5' 1"  (154.9 cm) Weight: 50.8 kg (112 lb) BMI (Calculated): 21.2   INTAKE/OUTPUT:  This shift: No intake/output data recorded.  Last 2 shifts: @IOLAST2SHIFTS @   PHYSICAL EXAM:  Constitutional:  -- Normal body habitus  -- Awake, alert, and oriented x3  Eyes:  -- Pupils equally round and reactive to light  -- No scleral icterus  Ear, nose, and throat:  -- No jugular venous distension  Pulmonary:  -- No crackles  -- Equal breath sounds bilaterally -- Breathing non-labored at rest Cardiovascular:  -- S1, S2 present  -- No pericardial rubs Gastrointestinal:  -- Abdomen soft, nontender, not appreciably distended, no guarding/rebound  -- No abdominal masses appreciated, pulsatile or otherwise; well healed midline linear surgical incision Musculoskeletal and Integumentary:  -- Wounds or skin discoloration: None appreciated -- Extremities: B/L UE and LE FROM, hands and feet warm, no edema  Neurologic:  -- Motor function:  intact and symmetric -- Sensation: intact and symmetric  Labs:  CBC:  Lab Results  Component Value Date   WBC 9.7 09/07/2016   RBC 4.17 09/07/2016   BMP:  Lab Results  Component Value Date   GLUCOSE 131 (H) 09/07/2016   CO2 29 09/07/2016   BUN 15 09/07/2016   CREATININE 0.72 09/07/2016   CREATININE 0.80 07/14/2015   CALCIUM 9.8 09/07/2016     Imaging studies:  Mild distension of the stomach with some debris and oral contrast material. Contrast material noted in mild dilated proximal and mid small bowel loops. There are fluid distended distal small bowel loops in mid lower abdomen and pelvis. In axial image 65 there is transition point in caliber of small bowel wall which is completely collapsed beyond this point. Findings consistent with small bowel obstruction probable due to adhesions. Again noted postsurgical changes in small bowel within midline upper pelvis presacral region without  evidence of anastomotic stricture. Mild distension of small bowel at this level with fluid. There is some colonic stool and gas within right colon and transverse colon. Some colonic stool noted within the descending colon. The sigmoid colon is empty collapsed. The terminal ileum is empty collapsed. No any contrast material noted within distal small bowel or within colon.  Small free fluid is noted within the pelvis adjacent to a mild distended small bowel loop on axial image 65. No evidence of small bowel perforation or free abdominal air. Midline anterior abdominal wall scarring is noted. Stable bilateral renal cysts. Stable cortical  scarring right kidney upper pole and midpole. No hydronephrosis  or hydroureter. Status post hysterectomy.  Assessment/Plan: (ICD-10's: K78.51) 66 y.o. female with resolving likely partial small bowel obstruction, complicated by pertinent comorbidities including prior hysterectomy for cervical cancer, chronic constipation requiring manual self-disimpaction  >1 year, chronic urinary retention/pelvic floor laxity relieved with self-applied pelvic pressure, and GERD.   - NPO with maintenance IVF  - admit to surgical service; no narcotics or anti-emetics ordered  - will hold off NG tube as patient reports flatus, hasn't required pain medication since this morning, and appears less distended   - if continues to improve, will advance to clears in the morning and then as tolerated with discharge planning tomorrow  - ambulation encouraged   - DVT prophylaxis  All of the above findings and recommendations were discussed with the patient and ED physician, and all of patient's questions were answered to her expressed satisfaction.  Thank you for the opportunity to participate in this patient's care.   -- Marilynne Drivers Rosana Hoes, MD, [place]: [facility] Office: [phone]

## 2016-09-07 NOTE — ED Notes (Signed)
Attempted to call report x 2, RN unavailable at this time. Jacqlyn Larsen, RN will call back to ED for report.

## 2016-09-08 DIAGNOSIS — K5651 Intestinal adhesions [bands], with partial obstruction: Secondary | ICD-10-CM | POA: Diagnosis not present

## 2016-09-08 LAB — BASIC METABOLIC PANEL
Anion gap: 5 (ref 5–15)
BUN: 9 mg/dL (ref 6–20)
CALCIUM: 8.9 mg/dL (ref 8.9–10.3)
CO2: 26 mmol/L (ref 22–32)
Chloride: 109 mmol/L (ref 101–111)
Creatinine, Ser: 0.65 mg/dL (ref 0.44–1.00)
GFR calc Af Amer: >60 mL/min (ref >60)
GLUCOSE: 107 mg/dL — AB (ref 65–99)
Potassium: 3.7 mmol/L (ref 3.5–5.1)
SODIUM: 140 mmol/L (ref 135–145)

## 2016-09-08 MED ORDER — ACETAMINOPHEN 325 MG PO TABS
650.0000 mg | ORAL_TABLET | Freq: Four times a day (QID) | ORAL | Status: DC | PRN
  Administered 2016-09-08: 650 mg via ORAL
  Filled 2016-09-08: qty 2

## 2016-09-08 NOTE — Progress Notes (Signed)
SURGICAL PROGRESS NOTE (cpt [id number])  Hospital Day(s): 0.   Post op day(s):  Marland Kitchen   Interval History: Patient seen and examined, no acute events or new complaints overnight. Patient reports ongoing flatus and tolerating soft diet, denies N/V and abdominal pain only focally at site of injection for DVT prophylaxis. Patient otherwise denies fever/chills, CP, or SOB.  Review of Systems:  Constitutional: denies fever, chills  HEENT: denies cough or congestion  Respiratory: denies any shortness of breath  Cardiovascular: denies chest pain or palpitations  Gastrointestinal: abdominal pain, N/V, and bowel function as per interval history Genitourinary: denies burning with urination or urinary frequency Musculoskeletal: denies pain, decreased motor or sensation Integumentary: denies any other rashes or skin discolorations Neurological: denies HA or vision/hearing changes   Vital signs in last 24 hours: [min-max] current  Temp:  [98.1 F (36.7 C)-98.5 F (36.9 C)] 98.1 F (36.7 C) (04/05 0500) Pulse Rate:  [58-71] 63 (04/05 0500) Resp:  [17-18] 18 (04/05 0500) BP: (98-138)/(49-87) 134/68 (04/05 0500) SpO2:  [93 %-100 %] 100 % (04/05 0500) Weight:  [52.6 kg (115 lb 14.4 oz)] 52.6 kg (115 lb 14.4 oz) (04/04 2100)     Height: 5' 1"  (154.9 cm) Weight: 52.6 kg (115 lb 14.4 oz) BMI (Calculated): 21.9   Intake/Output this shift:  No intake/output data recorded.   Intake/Output last 2 shifts:  @IOLAST2SHIFTS @   Physical Exam:  Constitutional: alert, cooperative and no distress  HENT: normocephalic without obvious abnormality  Eyes: PERRL, EOM's grossly intact and symmetric  Neuro: CN II - XII grossly intact and symmetric without deficit  Respiratory: breathing non-labored at rest  Cardiovascular: regular rate and sinus rhythm  Gastrointestinal: soft, non-tender, and non-distended  Musculoskeletal: UE and LE FROM, no edema or wounds, motor and sensation grossly intact, NT   Labs:  CBC:   Lab Results  Component Value Date   WBC 9.7 09/07/2016   RBC 4.17 09/07/2016   BMP:  Lab Results  Component Value Date   GLUCOSE 107 (H) 09/08/2016   CO2 26 09/08/2016   BUN 9 09/08/2016   CREATININE 0.65 09/08/2016   CREATININE 0.80 07/14/2015   CALCIUM 8.9 09/08/2016     Imaging studies: No new pertinent imaging studies   Assessment/Plan: (ICD-10's: K4.51) 66 y.o. female with resolving likely partial small bowel obstruction, complicated by pertinent comorbidities including prior hysterectomy for cervical cancer, chronic constipation requiring manual self-disimpaction >1 year, chronic urinary retention/pelvic floor laxity relieved with self-applied pelvic pressure, and GERD.              - ambulation encouraged              - continue to advance heart-healthy diet             - will discharge home today  All of the above findings and recommendations were discussed with the patient, and all of patient's questions were answered to her expressed satisfaction.  -- Marilynne Drivers Rosana Hoes, MD, [place]: [place] [facility] Office: [phone]

## 2016-09-08 NOTE — Care Management Note (Signed)
Case Management Note  Patient Details  Name: Rebecca Osborne MRN: [id number] Date of Birth: 1951/05/05  Subjective/Objective:                  Admitted for SBO. Pt from home, ind with ADL's. She has PCP and transportation. She does not know if she has drug coverage but is on no Rx chronically. She has no HH or DME needs pta. She is concerned about a hospital bill from earlier this year.  Action/Plan: Pt discharging home today with self care. Pt given contact info for the financial counselor here at [facility] to discuss her hospital bill.   Expected Discharge Date:  09/08/16               Expected Discharge Plan:  Home/Self Care  In-House Referral:  Financial Counselor  Discharge planning Services  CM Consult  Post Acute Care Choice:  NA Choice offered to:  NA  Status of Service:  Completed, signed off  Sherald Barge, RN 09/08/2016, 4:10 PM

## 2016-09-08 NOTE — Care Management Obs Status (Signed)
[place] NOTIFICATION   Patient Details  Name: Rebecca Osborne MRN: [id number] Date of Birth: 1950-09-22   Medicare Observation Status Notification Given:  Yes    Sherald Barge, RN 09/08/2016, 11:41 AM

## 2016-09-08 NOTE — Progress Notes (Addendum)
Pt IV removed per NT, tolerated well.  Reviewed discharge instructions with pt and answered all questions at this time. Pt discharged home.   1720- pt still awaiting ride home.

## 2016-09-09 NOTE — Discharge Summary (Signed)
Physician Discharge Summary  Patient ID: Rebecca Osborne MRN: [id number] DOB/AGE: [birth year] 66 y.o.  Admit date: 09/07/2016 Discharge date: 09/09/2016  Admission Diagnoses:  Discharge Diagnoses:  Active Problems:   Small bowel obstruction due to adhesions   Discharged Condition: good  Hospital Course: 66 y.o. female presented to [facility] ED with severe "10 out of 10" mid-abdominal pain and nausea that began after she ate a pimento cheese sandwich the night prior to admission. 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 Since undergoing CT, patient reported her pain resolved and she felt less distended/bloated. Considering her recent history, she was admitted for overnight <24 hours observation, during which time patient reported ongoing flatus and tolerated advancement of her diet. Discharge planning was accordingly initiated, and patient was safely able to be discharged home.  Consults: None  Significant Diagnostic Studies: radiology:  CT Abdomen and Pelvis with Contrast (09/07/2016):  Mild distension of the stomach with some debris and oral contrast material. Contrast material noted in mild dilated proximal and mid small bowel loops. There are fluid distended distal small bowel loops in mid lower abdomen and pelvis. In axial image 65 there is transition point in caliber of small bowel wall which is completely collapsed beyond this point. Findings consistent with small bowel obstruction probable due to adhesions. Again noted postsurgical changes in small bowel within midline upper pelvis presacral region without evidence of anastomotic stricture. Mild distension of small bowel at this level with fluid. There is some colonic stool and gas within right colon and transverse colon. Some colonic stool noted within the descending  colon. The sigmoid colon is empty collapsed. The terminal ileum is empty collapsed. No any contrast material noted within distal small bowel or within colon.  Small free fluid is noted within the pelvis adjacent to a mild distended small bowel loop on axial image 65. No evidence of small bowel perforation or free abdominal air. Midline anterior abdominal wall scarring is noted. Stable bilateral renal cysts. Stable cortical  scarring right kidney upper pole and midpole. No hydronephrosis  or hydroureter. Status post hysterectomy.  Treatments: IV hydration  Discharge Exam: Blood pressure (!) 115/55, pulse 71, temperature 98.6 F (37 C), temperature source Oral, resp. rate 18, height 5' 1"  (1.549 m), weight 52.6 kg (115 lb 14.4 oz), SpO2 98 %. General appearance: alert, cooperative and no distress Resp: clear to auscultation bilaterally Cardio: regular rate and rhythm, S1, S2 normal, no murmur, click, rub or gallop GI: soft, non-tender; bowel sounds normal; no masses,  no organomegaly  Disposition: 01-Home or Self Care   Allergies as of 09/08/2016      Reactions   Codeine Itching      Medication List    STOP taking these medications   oxyCODONE 5 MG immediate release tablet Commonly known as:  Oxy IR/ROXICODONE   polyethylene glycol packet Commonly known as:  MIRALAX / GLYCOLAX     TAKE these medications   aspirin EC 81 MG tablet Take 81 mg by mouth every morning.   docusate sodium 100 MG capsule Commonly known as:  COLACE Take 1 capsule (100 mg total) by mouth 2 (two) times daily as needed for mild constipation.   ibuprofen 200 MG tablet Commonly known as:  ADVIL,MOTRIN Take 600 mg by mouth every 6 (six) hours as needed for pain.   omeprazole 20 MG capsule Commonly known  as:  PRILOSEC Take 20 mg by mouth every morning.   VITAMIN D3 PO Take 2 tablets by mouth daily.        Signed: Vickie Epley 09/09/2016, 10:35 AM

## 2017-08-13 ENCOUNTER — Other Ambulatory Visit: Payer: Self-pay

## 2017-08-13 ENCOUNTER — Emergency Department (HOSPITAL_COMMUNITY)
Admission: EM | Admit: 2017-08-13 | Discharge: 2017-08-13 | Disposition: A | Discharge status: Home / Self Care | Payer: Medicare Other | Attending: Emergency Medicine | Admitting: Emergency Medicine

## 2017-08-13 ENCOUNTER — Encounter (HOSPITAL_COMMUNITY): Payer: Self-pay | Admitting: Emergency Medicine

## 2017-08-13 DIAGNOSIS — Z79899 Other long term (current) drug therapy: Secondary | ICD-10-CM | POA: Insufficient documentation

## 2017-08-13 DIAGNOSIS — Z8541 Personal history of malignant neoplasm of cervix uteri: Secondary | ICD-10-CM | POA: Insufficient documentation

## 2017-08-13 DIAGNOSIS — M25562 Pain in left knee: Secondary | ICD-10-CM | POA: Diagnosis present

## 2017-08-13 DIAGNOSIS — L03116 Cellulitis of left lower limb: Secondary | ICD-10-CM

## 2017-08-13 DIAGNOSIS — Z7982 Long term (current) use of aspirin: Secondary | ICD-10-CM | POA: Diagnosis not present

## 2017-08-13 DIAGNOSIS — R600 Localized edema: Secondary | ICD-10-CM

## 2017-08-13 MED ORDER — SULFAMETHOXAZOLE-TRIMETHOPRIM 800-160 MG PO TABS: 1.0000 | ORAL_TABLET | Freq: Two times a day (BID) | ORAL | 0 refills | Status: AC

## 2017-08-13 MED ORDER — SULFAMETHOXAZOLE-TRIMETHOPRIM 800-160 MG PO TABS
1.0000 | ORAL_TABLET | Freq: Once | ORAL | Status: AC
  Administered 2017-08-13: 1 via ORAL
  Filled 2017-08-13: qty 1

## 2017-08-13 NOTE — ED Triage Notes (Signed)
Patient c/o swelling and pain in knees bilaterally. Denies any injuries. Per patient left knee red and tender touch. Denies any fevers.

## 2017-08-13 NOTE — Discharge Instructions (Signed)
Bactrim twice daily for 10 days Ibuprofen as needed for pain Come back in the morning for your ultrasound Your redness may not start getting better for up to 48 hours, howevere if you have increased pain redness swelling or fevers you should return to see your doctor or the emergency department immediately.

## 2017-08-13 NOTE — ED Provider Notes (Signed)
[facility] EMERGENCY DEPARTMENT Provider Note   CSN: [id number] Arrival date & time: 08/13/17  1831     History   Chief Complaint Chief Complaint  Patient presents with  . Knee Pain    HPI Rebecca Osborne is a 67 y.o. female.  HPI  The patient is a 67 year old female, she has a known history of acid reflux but is otherwise very healthy, the patient denies any significant long-term problems, daily medications etc.  She states that over the last 2 days she has developed some increasing pain on the left inner medial knee extending up the thigh and down towards the calf slightly, also complaining of lesser pain in the right knee.  Denies fevers vomiting chest pain cough shortness of breath dysuria diarrhea or otherwise.  No history of DVT.  Symptoms are persistent, gradually worsening, nothing makes this better, worse with palpation.  Past Medical History:  Diagnosis Date  . Allergy   . Cancer (HCC)    cervical cancer cells  . GERD (gastroesophageal reflux disease)   . Osteopenia     Patient Active Problem List   Diagnosis Date Noted  . Small bowel obstruction due to adhesions ([place]) 06/22/2016  . GERD 08/31/2009  . DYSPHAGIA UNSPECIFIED 08/31/2009    Past Surgical History:  Procedure Laterality Date  . ABDOMINAL HYSTERECTOMY    . BOWEL RESECTION N/A 06/22/2016   Procedure: SMALL BOWEL RESECTION;  Surgeon: Vickie Epley, MD;  Location: [facility] ORS;  Service: General;  Laterality: N/A;  . CHOLECYSTECTOMY  2005  . FRACTURE SURGERY     rt leg  . LAPAROTOMY N/A 06/22/2016   Procedure: EXPLORATORY LAPAROTOMY;  Surgeon: Vickie Epley, MD;  Location: [facility] ORS;  Service: General;  Laterality: N/A;  . LYSIS OF ADHESION N/A 06/22/2016   Procedure: LYSIS OF ADHESION;  Surgeon: Vickie Epley, MD;  Location: [facility] ORS;  Service: General;  Laterality: N/A;    OB History    Gravida Para Term Preterm AB Living             3   SAB TAB Ectopic Multiple Live Births                    Home Medications    Prior to Admission medications   Medication Sig Start Date End Date Taking? Authorizing Provider  aspirin EC 81 MG tablet Take 81 mg by mouth every morning.    [provider]  Cholecalciferol (VITAMIN D3 PO) Take 2 tablets by mouth daily.    [provider]  docusate sodium (COLACE) 100 MG capsule Take 1 capsule (100 mg total) by mouth 2 (two) times daily as needed for mild constipation. 06/30/16   Vickie Epley, MD  ibuprofen (ADVIL,MOTRIN) 200 MG tablet Take 600 mg by mouth every 6 (six) hours as needed for pain.    [provider]  omeprazole (PRILOSEC) 20 MG capsule Take 20 mg by mouth every morning.    [provider]  sulfamethoxazole-trimethoprim (BACTRIM DS,SEPTRA DS) 800-160 MG tablet Take 1 tablet by mouth 2 (two) times daily for 10 days. 08/13/17 08/23/17  Noemi Chapel, MD    Family History Family History  Problem Relation Age of Onset  . COPD Mother   . Heart disease Mother   . Miscarriages / [place] Mother   . Vision loss Mother        from shingles  . COPD Father   . Cancer Brother  leukemia  . Early death Brother     Social History Social History   Tobacco Use  . Smoking status: Never Smoker  . Smokeless tobacco: Never Used  Substance Use Topics  . Alcohol use: No  . Drug use: No     Allergies   Codeine   Review of Systems Review of Systems  All other systems reviewed and are negative.    Physical Exam Updated Vital Signs BP (!) 137/91 (BP Location: Right Arm)   Pulse 86   Temp 97.8 F (36.6 C) (Oral)   Resp 18   Ht 4' 11"  (1.499 m)   Wt 59.9 kg (132 lb)   SpO2 97%   BMI 26.66 kg/m   Physical Exam  Constitutional: She appears well-developed and well-nourished. No distress.  HENT:  Head: Normocephalic and atraumatic.  Mouth/Throat: Oropharynx is clear and moist. No oropharyngeal exudate.  Eyes: Conjunctivae and EOM are normal. Pupils are equal, round, and  reactive to light. Right eye exhibits no discharge. Left eye exhibits no discharge. No scleral icterus.  Neck: Normal range of motion. Neck supple. No JVD present. No thyromegaly present.  Cardiovascular: Normal rate, regular rhythm, normal heart sounds and intact distal pulses. Exam reveals no gallop and no friction rub.  No murmur heard. Pulmonary/Chest: Effort normal and breath sounds normal. No respiratory distress. She has no wheezes. She has no rales.  Abdominal: Soft. Bowel sounds are normal. She exhibits no distension and no mass. There is no tenderness.  Musculoskeletal: Normal range of motion. She exhibits tenderness ( There is tenderness to palpation over the left medial thigh and knee, there is redness in this area, no induration). She exhibits no edema or deformity.  Lymphadenopathy:    She has no cervical adenopathy.  Neurological: She is alert. Coordination normal.  Skin: Skin is warm and dry. Rash noted. There is erythema.  Psychiatric: She has a normal mood and affect. Her behavior is normal.  Nursing note and vitals reviewed.    ED Treatments / Results  Labs (all labs ordered are listed, but only abnormal results are displayed) Labs Reviewed - No data to display  EKG  EKG Interpretation None       Radiology No results found.  Procedures Procedures (including critical care time)  Medications Ordered in ED Medications - No data to display   Initial Impression / Assessment and Plan / ED Course  I have reviewed the triage vital signs and the nursing notes.  Pertinent labs & imaging results that were available during my care of the patient were reviewed by me and considered in my medical decision making (see chart for details).     The patient has what appears to be an early cellulitis on the medial left thigh and knee, there is no induration but the skin is warm, there is no edema of the leg, she has normal pulses at the feet.  She will be scheduled for an  outpatient DVT study in the morning however the patient will be started on antibiotics tonight, Bactrim would be a good choice.  She and her family members were counseled on the indications for return and expected course and they are agreeable.  She has no signs of sepsis including no tachycardia, no fever, no hypotension.  Will come back in AM for [place]  Final Clinical Impressions(s) / ED Diagnoses   Final diagnoses:  Cellulitis of left leg    ED Discharge Orders        Ordered  sulfamethoxazole-trimethoprim (BACTRIM DS,SEPTRA DS) 800-160 MG tablet  2 times daily     08/13/17 1950    US Venous Img Lower Unilateral Left     08/13/17 1950       Noemi Chapel, MD 08/13/17 [phone]

## 2017-08-13 NOTE — ED Notes (Signed)
ED Provider at bedside. 

## 2017-08-14 ENCOUNTER — Ambulatory Visit (HOSPITAL_COMMUNITY)
Admission: RE | Admit: 2017-08-14 | Discharge: 2017-08-14 | Disposition: A | Discharge status: Home / Self Care | Payer: Medicare Other | Source: Ambulatory Visit | Attending: Emergency Medicine | Admitting: Emergency Medicine

## 2017-08-14 DIAGNOSIS — R600 Localized edema: Secondary | ICD-10-CM | POA: Insufficient documentation

## 2017-08-14 NOTE — ED Provider Notes (Signed)
She is here for follow-up duplex after being here last evening for left knee and leg pain.  She had a outpatient ultrasound that was negative for DVT.  Reviewed these findings with the patient and recommended that she follow-up with her PCP.   Hayden Rasmussen, MD 08/14/17 1515

## 2017-08-17 ENCOUNTER — Encounter: Payer: Self-pay | Admitting: Family Medicine

## 2017-08-17 ENCOUNTER — Ambulatory Visit (INDEPENDENT_AMBULATORY_CARE_PROVIDER_SITE_OTHER): Payer: Medicare Other | Admitting: Family Medicine

## 2017-08-17 ENCOUNTER — Ambulatory Visit (HOSPITAL_COMMUNITY)
Admission: RE | Admit: 2017-08-17 | Discharge: 2017-08-17 | Disposition: A | Discharge status: Home / Self Care | Payer: Medicare Other | Source: Ambulatory Visit | Attending: Family Medicine | Admitting: Family Medicine

## 2017-08-17 ENCOUNTER — Other Ambulatory Visit: Payer: Self-pay

## 2017-08-17 VITALS — BP 102/66 | HR 81 | Temp 98.0°F | Resp 16 | Ht 60.0 in | Wt 131.0 lb

## 2017-08-17 DIAGNOSIS — M25562 Pain in left knee: Secondary | ICD-10-CM

## 2017-08-17 DIAGNOSIS — M7989 Other specified soft tissue disorders: Secondary | ICD-10-CM | POA: Diagnosis not present

## 2017-08-17 MED ORDER — DICLOFENAC SODIUM 75 MG PO TBEC: 75.0000 mg | DELAYED_RELEASE_TABLET | Freq: Two times a day (BID) | ORAL | 0 refills | Status: DC

## 2017-08-17 NOTE — Progress Notes (Signed)
Subjective:    Patient ID: Rebecca Osborne, female    DOB: 03/28/1951, 67 y.o.   MRN: [id number]  HPI Patient presents with pain in and around the left knee.  She was seen in the emergency room earlier this week and was diagnosed with cellulitis.  Per the chart, there was redness and swelling around the left knee.  An ultrasound was obtained that was negative for DVT.  Patient was started on Bactrim for cellulitis.  There is no residual erythema or swelling over the medial left knee or in the knee joint however she has significant pain in the posterior aspect of the left knee joint with standing, walking up steps, ambulation.  There is no effusion.  There is no erythema.  There is no warmth.  Patient has pain with flexion.  There is no tenderness to palpation over the joint lines bilaterally.  She has a negative anterior and posterior drawer sign.  She has no laxity to varus or valgus stress.  She has no pain with Apley grind.  However when she steps off the exam table she winces in pain in her knee buckles due to pain inside the knee joint. Past Medical History:  Diagnosis Date  . Allergy   . Cancer (HCC)    cervical cancer cells  . GERD (gastroesophageal reflux disease)   . Osteopenia    Past Surgical History:  Procedure Laterality Date  . ABDOMINAL HYSTERECTOMY    . BOWEL RESECTION N/A 06/22/2016   Procedure: SMALL BOWEL RESECTION;  Surgeon: Vickie Epley, MD;  Location: [facility];  Service: General;  Laterality: N/A;  . CHOLECYSTECTOMY  2005  . FRACTURE SURGERY     rt leg  . LAPAROTOMY N/A 06/22/2016   Procedure: EXPLORATORY LAPAROTOMY;  Surgeon: Vickie Epley, MD;  Location: [facility];  Service: General;  Laterality: N/A;  . LYSIS OF ADHESION N/A 06/22/2016   Procedure: LYSIS OF ADHESION;  Surgeon: Vickie Epley, MD;  Location: [facility];  Service: General;  Laterality: N/A;   Current Outpatient Medications on File Prior to Visit  Medication Sig Dispense Refill  . aspirin EC 81 MG  tablet Take 81 mg by mouth every morning.    . Cholecalciferol (VITAMIN D3 PO) Take 2 tablets by mouth daily.    Marland Kitchen docusate sodium (COLACE) 100 MG capsule Take 1 capsule (100 mg total) by mouth 2 (two) times daily as needed for mild constipation. 40 capsule 0  . sulfamethoxazole-trimethoprim (BACTRIM DS,SEPTRA DS) 800-160 MG tablet Take 1 tablet by mouth 2 (two) times daily for 10 days. 20 tablet 0   No current facility-administered medications on file prior to visit.    Allergies  Allergen Reactions  . Codeine Itching   Social History   Socioeconomic History  . Marital status: Divorced    Spouse name: Not on file  . Number of children: Not on file  . Years of education: Not on file  . Highest education level: Not on file  Social Needs  . Financial resource strain: Not on file  . Food insecurity - worry: Not on file  . Food insecurity - inability: Not on file  . Transportation needs - medical: Not on file  . Transportation needs - non-medical: Not on file  Occupational History  . Not on file  Tobacco Use  . Smoking status: Never Smoker  . Smokeless tobacco: Never Used  Substance and Sexual Activity  . Alcohol use: No  . Drug use: No  .  Sexual activity: Not Currently  Other Topics Concern  . Not on file  Social History Narrative  . Not on file      Review of Systems  All other systems reviewed and are negative.      Objective:   Physical Exam  Cardiovascular: Normal rate, regular rhythm and normal heart sounds.  Pulmonary/Chest: Effort normal and breath sounds normal.  Musculoskeletal: She exhibits no edema.       Left knee: She exhibits decreased range of motion and abnormal meniscus. She exhibits no swelling, no effusion, no erythema, no LCL laxity and no MCL laxity. No medial joint line, no lateral joint line, no MCL and no LCL tenderness noted.  Vitals reviewed.         Assessment & Plan:  Acute pain of left knee - Plan: diclofenac (VOLTAREN) 75 MG EC  tablet, DG Knee Complete 4 Views Left, CBC with Differential/Platelet, COMPLETE METABOLIC PANEL WITH GFR, Uric acid  Exam is only significant for numerous varicose veins up and down the left leg.  However ultrasound is negative for DVT and there was no evidence of superficial thrombophlebitis according to the ultrasound report or the ER physician's note.  It is possible she has partially treated cellulitis.  The erythema in the skin has totally resolved however her pain is only marginally better.  This is not in keeping with a cellulitis.  Therefore I would obtain an x-ray of the left knee to evaluate for possible arthritis.  I believe septic arthritis is unlikely given the fact there is no erythema swelling or effusion in the left knee joint.  Gout is another possibility.  Therefore I will check a uric acid level.  Recheck on Monday.  Take diclofenac 75 mg p.o. twice daily until then.  If no better on Monday, x-ray is negative, uric acid level is normal, white blood cell count is normal, and the patient has finished her antibiotics, consider a cortisone injection for possible meniscal tear.

## 2017-08-18 LAB — CBC WITH DIFFERENTIAL/PLATELET
BASOS ABS: 39 {cells}/uL (ref 0–200)
BASOS PCT: 0.7 %
EOS PCT: 2 %
Eosinophils Absolute: 112 cells/uL (ref 15–500)
HCT: 34.6 % — ABNORMAL LOW (ref 35.0–45.0)
HEMOGLOBIN: 12.1 g/dL (ref 11.7–15.5)
Lymphs Abs: 1327 cells/uL (ref 850–3900)
MCH: 30.7 pg (ref 27.0–33.0)
MCHC: 35 g/dL (ref 32.0–36.0)
MCV: 87.8 fL (ref 80.0–100.0)
MONOS PCT: 9.3 %
MPV: 10.7 fL (ref 7.5–12.5)
Neutro Abs: 3601 cells/uL (ref 1500–7800)
Neutrophils Relative %: 64.3 %
PLATELETS: 252 10*3/uL (ref 140–400)
RBC: 3.94 10*6/uL (ref 3.80–5.10)
RDW: 12.4 % (ref 11.0–15.0)
TOTAL LYMPHOCYTE: 23.7 %
WBC mixed population: 521 cells/uL (ref 200–950)
WBC: 5.6 10*3/uL (ref 3.8–10.8)

## 2017-08-18 LAB — COMPLETE METABOLIC PANEL WITH GFR
AG Ratio: 2.1 (calc) (ref 1.0–2.5)
ALBUMIN MSPROF: 4.5 g/dL (ref 3.6–5.1)
ALT: 11 U/L (ref 6–29)
AST: 17 U/L (ref 10–35)
Alkaline phosphatase (APISO): 87 U/L (ref 33–130)
BUN / CREAT RATIO: 23 (calc) — AB (ref 6–22)
BUN: 26 mg/dL — AB (ref 7–25)
CO2: 23 mmol/L (ref 20–32)
CREATININE: 1.14 mg/dL — AB (ref 0.50–0.99)
Calcium: 9.2 mg/dL (ref 8.6–10.4)
Chloride: 107 mmol/L (ref 98–110)
GFR, Est African American: 58 mL/min/{1.73_m2} — ABNORMAL LOW (ref >=60)
GFR, Est Non African American: 50 mL/min/{1.73_m2} — ABNORMAL LOW (ref >=60)
GLUCOSE: 79 mg/dL (ref 65–99)
Globulin: 2.1 g/dL (calc) (ref 1.9–3.7)
Potassium: 5.1 mmol/L (ref 3.5–5.3)
Sodium: 138 mmol/L (ref 135–146)
Total Bilirubin: 0.3 mg/dL (ref 0.2–1.2)
Total Protein: 6.6 g/dL (ref 6.1–8.1)

## 2017-08-18 LAB — URIC ACID: URIC ACID, SERUM: 5 mg/dL (ref 2.5–7.0)

## 2017-08-28 ENCOUNTER — Ambulatory Visit (INDEPENDENT_AMBULATORY_CARE_PROVIDER_SITE_OTHER): Payer: Medicare Other | Admitting: Family Medicine

## 2017-08-28 ENCOUNTER — Encounter: Payer: Self-pay | Admitting: Family Medicine

## 2017-08-28 VITALS — BP 120/70 | HR 70 | Temp 97.8°F | Resp 14 | Ht 59.0 in | Wt 130.0 lb

## 2017-08-28 DIAGNOSIS — N289 Disorder of kidney and ureter, unspecified: Secondary | ICD-10-CM | POA: Diagnosis not present

## 2017-08-28 NOTE — Progress Notes (Signed)
Subjective:    Patient ID: Rebecca Osborne, female    DOB: [birth year], 67 y.o.   MRN: [id number]  HPI  08/17/17 Patient presents with pain in and around the left knee.  She was seen in the emergency room earlier this week and was diagnosed with cellulitis.  Per the chart, there was redness and swelling around the left knee.  An ultrasound was obtained that was negative for DVT.  Patient was started on Bactrim for cellulitis.  There is no residual erythema or swelling over the medial left knee or in the knee joint however she has significant pain in the posterior aspect of the left knee joint with standing, walking up steps, ambulation.  There is no effusion.  There is no erythema.  There is no warmth.  Patient has pain with flexion.  There is no tenderness to palpation over the joint lines bilaterally.  She has a negative anterior and posterior drawer sign.  She has no laxity to varus or valgus stress.  She has no pain with Apley grind.  However when she steps off the exam table she winces in pain in her knee buckles due to pain inside the knee joint.  At that time, my plan was: Exam is only significant for numerous varicose veins up and down the left leg.  However ultrasound is negative for DVT and there was no evidence of superficial thrombophlebitis according to the ultrasound report or the ER physician's note.  It is possible she has partially treated cellulitis.  The erythema in the skin has totally resolved however her pain is only marginally better.  This is not in keeping with a cellulitis.  Therefore I would obtain an x-ray of the left knee to evaluate for possible arthritis.  I believe septic arthritis is unlikely given the fact there is no erythema swelling or effusion in the left knee joint.  Gout is another possibility.  Therefore I will check a uric acid level.  Recheck on Monday.  Take diclofenac 75 mg p.o. twice daily until then.  If no better on Monday, x-ray is negative, uric acid level is  normal, white blood cell count is normal, and the patient has finished her antibiotics, consider a cortisone injection for possible meniscal tear.  08/28/17 Office Visit on 08/17/2017  Component Date Value Ref Range Status  . WBC 08/17/2017 5.6  3.8 - 10.8 Thousand/uL Final  . RBC 08/17/2017 3.94  3.80 - 5.10 Million/uL Final  . Hemoglobin 08/17/2017 12.1  11.7 - 15.5 g/dL Final  . HCT 08/17/2017 34.6* 35.0 - 45.0 % Final  . MCV 08/17/2017 87.8  80.0 - 100.0 fL Final  . MCH 08/17/2017 30.7  27.0 - 33.0 pg Final  . MCHC 08/17/2017 35.0  32.0 - 36.0 g/dL Final  . RDW 08/17/2017 12.4  11.0 - 15.0 % Final  . Platelets 08/17/2017 252  140 - 400 Thousand/uL Final  . MPV 08/17/2017 10.7  7.5 - 12.5 fL Final  . Neutro Abs 08/17/2017 3,601  1,500 - 7,800 cells/uL Final  . Lymphs Abs 08/17/2017 1,327  850 - 3,900 cells/uL Final  . WBC mixed population 08/17/2017 521  200 - 950 cells/uL Final  . Eosinophils Absolute 08/17/2017 112  15 - 500 cells/uL Final  . Basophils Absolute 08/17/2017 39  0 - 200 cells/uL Final  . Neutrophils Relative % 08/17/2017 64.3  % Final  . Total Lymphocyte 08/17/2017 23.7  % Final  . Monocytes Relative 08/17/2017 9.3  % Final  .  Eosinophils Relative 08/17/2017 2.0  % Final  . Basophils Relative 08/17/2017 0.7  % Final  . Glucose, Bld 08/17/2017 79  65 - 99 mg/dL Final   Comment: .            Fasting reference interval .   . BUN 08/17/2017 26* 7 - 25 mg/dL Final  . Creat 08/17/2017 1.14* 0.50 - 0.99 mg/dL Final   Comment: For patients >58 years of age, the reference limit for Creatinine is approximately 13% higher for people identified as African-American. .   . GFR, Est Non African American 08/17/2017 50* > OR = 60 mL/min/1.72m Final  . GFR, Est African American 08/17/2017 58* > OR = 60 mL/min/1.770mFinal  . BUN/Creatinine Ratio 08/17/2017 23* 6 - 22 (calc) Final  . Sodium 08/17/2017 138  135 - 146 mmol/L Final  . Potassium 08/17/2017 5.1  3.5 - 5.3 mmol/L  Final  . Chloride 08/17/2017 107  98 - 110 mmol/L Final  . CO2 08/17/2017 23  20 - 32 mmol/L Final  . Calcium 08/17/2017 9.2  8.6 - 10.4 mg/dL Final  . Total Protein 08/17/2017 6.6  6.1 - 8.1 g/dL Final  . Albumin 08/17/2017 4.5  3.6 - 5.1 g/dL Final  . Globulin 08/17/2017 2.1  1.9 - 3.7 g/dL (calc) Final  . AG Ratio 08/17/2017 2.1  1.0 - 2.5 (calc) Final  . Total Bilirubin 08/17/2017 0.3  0.2 - 1.2 mg/dL Final  . Alkaline phosphatase (APISO) 08/17/2017 87  33 - 130 U/L Final  . AST 08/17/2017 17  10 - 35 U/L Final  . ALT 08/17/2017 11  6 - 29 U/L Final  . Uric Acid, Serum 08/17/2017 5.0  2.5 - 7.0 mg/dL Final   Comment: Therapeutic target for gout patients: <6.0 mg/dL .    Xray revealed: Slight arthritic changes of the patellofemoral compartment.  She is here today to go over her lab work.  Her creatinine rose from an average of 0.7-0.8 all the way to 1.14.  This has her very concerned.  She denies taking NSAIDs prior to her lab work.  She denies any blood in her urine.  She denies any dysuria or fever.  she has she has been taking diclofenac since I saw her for the knee pain but in the knee pain has thankfully resolved  Past Medical History:  Diagnosis Date  . Allergy   . Cancer (HCC)    cervical cancer cells  . GERD (gastroesophageal reflux disease)   . Osteopenia    Past Surgical History:  Procedure Laterality Date  . ABDOMINAL HYSTERECTOMY    . BOWEL RESECTION N/A 06/22/2016   Procedure: SMALL BOWEL RESECTION;  Surgeon: JaVickie EpleyMD;  Location: [facility];  Service: General;  Laterality: N/A;  . CHOLECYSTECTOMY  2005  . FRACTURE SURGERY     rt leg  . LAPAROTOMY N/A 06/22/2016   Procedure: EXPLORATORY LAPAROTOMY;  Surgeon: JaVickie EpleyMD;  Location: [facility];  Service: General;  Laterality: N/A;  . LYSIS OF ADHESION N/A 06/22/2016   Procedure: LYSIS OF ADHESION;  Surgeon: JaVickie EpleyMD;  Location: [facility];  Service: General;  Laterality: N/A;   Current  Outpatient Medications on File Prior to Visit  Medication Sig Dispense Refill  . aspirin EC 81 MG tablet Take 81 mg by mouth every morning.    . Cholecalciferol (VITAMIN D3 PO) Take 2 tablets by mouth daily.    . diclofenac (VOLTAREN) 75 MG EC tablet Take 1  tablet (75 mg total) by mouth 2 (two) times daily. 30 tablet 0  . docusate sodium (COLACE) 100 MG capsule Take 1 capsule (100 mg total) by mouth 2 (two) times daily as needed for mild constipation. 40 capsule 0   No current facility-administered medications on file prior to visit.    Allergies  Allergen Reactions  . Codeine Itching   Social History   Socioeconomic History  . Marital status: Divorced    Spouse name: Not on file  . Number of children: Not on file  . Years of education: Not on file  . Highest education level: Not on file  Occupational History  . Not on file  Social Needs  . Financial resource strain: Not on file  . Food insecurity:    Worry: Not on file    Inability: Not on file  . Transportation needs:    Medical: Not on file    Non-medical: Not on file  Tobacco Use  . Smoking status: Never Smoker  . Smokeless tobacco: Never Used  Substance and Sexual Activity  . Alcohol use: No  . Drug use: No  . Sexual activity: Not Currently  Lifestyle  . Physical activity:    Days per week: Not on file    Minutes per session: Not on file  . Stress: Not on file  Relationships  . Social connections:    Talks on phone: Not on file    Gets together: Not on file    Attends religious service: Not on file    Active member of club or organization: Not on file    Attends meetings of clubs or organizations: Not on file    Relationship status: Not on file  . Intimate partner violence:    Fear of current or ex partner: Not on file    Emotionally abused: Not on file    Physically abused: Not on file    Forced sexual activity: Not on file  Other Topics Concern  . Not on file  Social History Narrative  . Not on file        Review of Systems  All other systems reviewed and are negative.      Objective:   Physical Exam  Cardiovascular: Normal rate, regular rhythm and normal heart sounds.  Pulmonary/Chest: Effort normal and breath sounds normal.  Musculoskeletal: She exhibits no edema.  Vitals reviewed.         Assessment & Plan:  Renal insufficiency - Plan: Basic Metabolic Panel  Given her elevated creatinine, I recommended discontinuation of diclofenac.  Repeat a BMP.  If renal functioning is worsening, would then recommend obtaining a renal ultrasound as well as urinalysis.

## 2017-08-29 LAB — BASIC METABOLIC PANEL
BUN / CREAT RATIO: 31 (calc) — AB (ref 6–22)
BUN: 27 mg/dL — ABNORMAL HIGH (ref 7–25)
CO2: 25 mmol/L (ref 20–32)
CREATININE: 0.86 mg/dL (ref 0.50–0.99)
Calcium: 9.3 mg/dL (ref 8.6–10.4)
Chloride: 104 mmol/L (ref 98–110)
Glucose, Bld: 82 mg/dL (ref 65–99)
POTASSIUM: 5.6 mmol/L — AB (ref 3.5–5.3)
SODIUM: 136 mmol/L (ref 135–146)

## 2017-08-29 LAB — EXTRA LAV TOP TUBE

## 2018-02-09 ENCOUNTER — Other Ambulatory Visit: Payer: Self-pay

## 2018-02-09 ENCOUNTER — Emergency Department (HOSPITAL_COMMUNITY): Payer: Medicare Other

## 2018-02-09 ENCOUNTER — Emergency Department (HOSPITAL_COMMUNITY)
Admission: EM | Admit: 2018-02-09 | Discharge: 2018-02-09 | Disposition: A | Discharge status: Home / Self Care | Payer: Medicare Other | Attending: Emergency Medicine | Admitting: Emergency Medicine

## 2018-02-09 ENCOUNTER — Encounter (HOSPITAL_COMMUNITY): Payer: Self-pay | Admitting: Emergency Medicine

## 2018-02-09 DIAGNOSIS — R0602 Shortness of breath: Secondary | ICD-10-CM | POA: Diagnosis not present

## 2018-02-09 DIAGNOSIS — Z9049 Acquired absence of other specified parts of digestive tract: Secondary | ICD-10-CM | POA: Insufficient documentation

## 2018-02-09 DIAGNOSIS — Z8541 Personal history of malignant neoplasm of cervix uteri: Secondary | ICD-10-CM | POA: Diagnosis not present

## 2018-02-09 DIAGNOSIS — Z79899 Other long term (current) drug therapy: Secondary | ICD-10-CM | POA: Diagnosis not present

## 2018-02-09 DIAGNOSIS — R079 Chest pain, unspecified: Secondary | ICD-10-CM | POA: Insufficient documentation

## 2018-02-09 LAB — CBC WITH DIFFERENTIAL/PLATELET
BASOS PCT: 1 %
Basophils Absolute: 0 10*3/uL (ref 0.0–0.1)
EOS ABS: 0.1 10*3/uL (ref 0.0–0.7)
EOS PCT: 2 %
HCT: 35.1 % — ABNORMAL LOW (ref 36.0–46.0)
HEMOGLOBIN: 11.4 g/dL — AB (ref 12.0–15.0)
LYMPHS ABS: 2.1 10*3/uL (ref 0.7–4.0)
Lymphocytes Relative: 29 %
MCH: 29.8 pg (ref 26.0–34.0)
MCHC: 32.5 g/dL (ref 30.0–36.0)
MCV: 91.9 fL (ref 78.0–100.0)
MONOS PCT: 7 %
Monocytes Absolute: 0.5 10*3/uL (ref 0.1–1.0)
Neutro Abs: 4.5 10*3/uL (ref 1.7–7.7)
Neutrophils Relative %: 61 %
PLATELETS: 237 10*3/uL (ref 150–400)
RBC: 3.82 MIL/uL — ABNORMAL LOW (ref 3.87–5.11)
RDW: 13 % (ref 11.5–15.5)
WBC: 7.3 10*3/uL (ref 4.0–10.5)

## 2018-02-09 LAB — BASIC METABOLIC PANEL
Anion gap: 7 (ref 5–15)
BUN: 16 mg/dL (ref 8–23)
CALCIUM: 8.8 mg/dL — AB (ref 8.9–10.3)
CO2: 25 mmol/L (ref 22–32)
CREATININE: 0.76 mg/dL (ref 0.44–1.00)
Chloride: 108 mmol/L (ref 98–111)
GFR calc non Af Amer: >60 mL/min (ref >60)
Glucose, Bld: 97 mg/dL (ref 70–99)
Potassium: 3.8 mmol/L (ref 3.5–5.1)
SODIUM: 140 mmol/L (ref 135–145)

## 2018-02-09 LAB — TROPONIN I: Troponin I: <0.03 ng/mL (ref <0.03)

## 2018-02-09 MED ORDER — KETOROLAC TROMETHAMINE 60 MG/2ML IM SOLN
60.0000 mg | Freq: Once | INTRAMUSCULAR | Status: AC
  Administered 2018-02-09: 60 mg via INTRAMUSCULAR
  Filled 2018-02-09: qty 2

## 2018-02-09 MED ORDER — MELOXICAM 7.5 MG PO TABS: 15.0000 mg | ORAL_TABLET | Freq: Every day | ORAL | 0 refills | Status: DC

## 2018-02-09 NOTE — ED Provider Notes (Signed)
[facility] EMERGENCY DEPARTMENT Provider Note   CSN: [id number] Arrival date & time: 02/09/18  2023     History   Chief Complaint Chief Complaint  Patient presents with  . Chest Pain    HPI Rebecca Osborne is a 67 y.o. female.  HPI  67 year old female, she has a known history of acid reflux, she denies any other medical problems and denies having cancer, denies having any other risk factors for pulmonary embolism or coronary disease, she does not smoke cigarettes and has no high blood pressure diabetes or hypercholesterolemia.  She states that last night at about this time she was in bed, when she rolled over onto her side she felt acute onset of a sharp pain in the left side of her chest which is worse with movement, worse with range of motion of the left arm and worse with deep breathing.  She has no fevers chills nausea or vomiting, she is not short of breath and has no swelling of the lower extremities.  Symptoms are persistent, they have not let up in 24 hours.  She denies any prior history of exertional symptoms, she has never had cardiac testing.  Past Medical History:  Diagnosis Date  . Allergy   . Cancer (HCC)    cervical cancer cells  . GERD (gastroesophageal reflux disease)   . Osteopenia     Patient Active Problem List   Diagnosis Date Noted  . Small bowel obstruction due to adhesions ([place]) 06/22/2016  . GERD 08/31/2009  . DYSPHAGIA UNSPECIFIED 08/31/2009    Past Surgical History:  Procedure Laterality Date  . ABDOMINAL HYSTERECTOMY    . BOWEL RESECTION N/A 06/22/2016   Procedure: SMALL BOWEL RESECTION;  Surgeon: Vickie Epley, MD;  Location: [facility];  Service: General;  Laterality: N/A;  . CHOLECYSTECTOMY  2005  . FRACTURE SURGERY     rt leg  . LAPAROTOMY N/A 06/22/2016   Procedure: EXPLORATORY LAPAROTOMY;  Surgeon: Vickie Epley, MD;  Location: [facility];  Service: General;  Laterality: N/A;  . LYSIS OF ADHESION N/A 06/22/2016   Procedure: LYSIS OF  ADHESION;  Surgeon: Vickie Epley, MD;  Location: [facility];  Service: General;  Laterality: N/A;     OB History    Gravida      Para      Term      Preterm      AB      Living  3     SAB      TAB      Ectopic      Multiple      Live Births               Home Medications    Prior to Admission medications   Medication Sig Start Date End Date Taking? Authorizing Provider  acetaminophen (TYLENOL) 500 MG tablet Take 500 mg by mouth every 6 (six) hours as needed for mild pain or moderate pain.   Yes [provider]  Calcium Citrate-Vitamin D (CALCIUM + D PO) Take 2 tablets by mouth daily.   Yes [provider]  meloxicam (MOBIC) 7.5 MG tablet Take 2 tablets (15 mg total) by mouth daily. 02/09/18   Noemi Chapel, MD    Family History Family History  Problem Relation Age of Onset  . COPD Mother   . Heart disease Mother   . Miscarriages / [place] Mother   . Vision loss Mother        from  shingles  . COPD Father   . Cancer Brother        leukemia  . Early death Brother     Social History Social History   Tobacco Use  . Smoking status: Never Smoker  . Smokeless tobacco: Never Used  Substance Use Topics  . Alcohol use: No  . Drug use: No     Allergies   Codeine   Review of Systems Review of Systems  All other systems reviewed and are negative.    Physical Exam Updated Vital Signs BP 131/70   Pulse 72   Temp 98.3 F (36.8 C) (Temporal)   Resp (!) 27   Ht 1.549 m (5' 1" )   Wt 59 kg   SpO2 99%   BMI 24.56 kg/m   Physical Exam  Constitutional: She appears well-developed and well-nourished. No distress.  HENT:  Head: Normocephalic and atraumatic.  Mouth/Throat: Oropharynx is clear and moist. No oropharyngeal exudate.  Eyes: Pupils are equal, round, and reactive to light. Conjunctivae and EOM are normal. Right eye exhibits no discharge. Left eye exhibits no discharge. No scleral icterus.  Neck: Normal range of motion.  Neck supple. No JVD present. No thyromegaly present.  Cardiovascular: Normal rate, regular rhythm, normal heart sounds and intact distal pulses. Exam reveals no gallop and no friction rub.  No murmur heard. Pulmonary/Chest: Effort normal and breath sounds normal. No respiratory distress. She has no wheezes. She has no rales. She exhibits tenderness ( ttp over the L chest wall).  Abdominal: Soft. Bowel sounds are normal. She exhibits no distension and no mass. There is no tenderness.  Musculoskeletal: Normal range of motion. She exhibits no edema or tenderness.  Lymphadenopathy:    She has no cervical adenopathy.  Neurological: She is alert. Coordination normal.  Skin: Skin is warm and dry. No rash noted. No erythema.  Psychiatric: She has a normal mood and affect. Her behavior is normal.  Nursing note and vitals reviewed.    ED Treatments / Results  Labs (all labs ordered are listed, but only abnormal results are displayed) Labs Reviewed  BASIC METABOLIC PANEL - Abnormal; Notable for the following components:      Result Value   Calcium 8.8 (*)    All other components within normal limits  CBC WITH DIFFERENTIAL/PLATELET - Abnormal; Notable for the following components:   RBC 3.82 (*)    Hemoglobin 11.4 (*)    HCT 35.1 (*)    All other components within normal limits  TROPONIN I    EKG EKG Interpretation  Date/Time:  Friday February 09 2018 20:33:00 EDT Ventricular Rate:  73 PR Interval:  150 QRS Duration: 82 QT Interval:  394 QTC Calculation: 434 R Axis:   41 Text Interpretation:  Sinus rhythm with Premature atrial complexes Otherwise normal ECG No old tracing to compare Confirmed by Noemi Chapel [phone]) on 02/09/2018 9:01:10 PM   Radiology Dg Chest 2 View  Result Date: 02/09/2018 CLINICAL DATA:  Chest pain and shortness of breath. EXAM: CHEST - 2 VIEW COMPARISON:  06/22/2016. FINDINGS: Stable borderline enlarged cardiac silhouette and tortuous aorta. Clear lungs. Mild  thoracic spine degenerative changes. Cholecystectomy clip. IMPRESSION: No acute abnormality. Electronically Signed   By: Claudie Revering M.D.   On: 02/09/2018 21:49    Procedures Procedures (including critical care time)  Medications Ordered in ED Medications  ketorolac (TORADOL) injection 60 mg (60 mg Intramuscular Given 02/09/18 2159)     Initial Impression / Assessment and Plan / ED Course  I have reviewed the triage vital signs and the nursing notes.  Pertinent labs & imaging results that were available during my care of the patient were reviewed by me and considered in my medical decision making (see chart for details).  Clinical Course as of Feb 09 2213  Fri Feb 09, 2018  2212 CBC unremarkable, metabolic panel also unremarkable, troponin is normal, x-ray shows no acute findings   [BM]  2212 I personally looked at the x-ray, there is no signs of infiltrate, pneumothorax or abnormal mediastinum.   [BM]    Clinical Course User Index [BM] Noemi Chapel, MD    EKG is normal, chest x-ray labs pending, if troponin negative after 24 hours of pain there is no indication for further testing, if anything is positive will need to have further follow-up.  Patient appears stable, given anti-inflammatories.  Vitals:   02/09/18 2032 02/09/18 2041 02/09/18 2042 02/09/18 2100  BP: (!) 149/82 131/70    Pulse: 77  71 72  Resp: 18  11 (!) 27  Temp: 98.3 F (36.8 C)     TempSrc: Temporal     SpO2: 100%  99% 99%  Weight:      Height:         Final Clinical Impressions(s) / ED Diagnoses   Final diagnoses:  Left-sided chest pain    ED Discharge Orders         Ordered    meloxicam (MOBIC) 7.5 MG tablet  Daily     02/09/18 2213           Noemi Chapel, MD 02/09/18 2214

## 2018-02-09 NOTE — ED Notes (Signed)
EDP in room

## 2018-02-09 NOTE — Discharge Instructions (Signed)
Your testing has not shown any specific abnormalities thankfully.  Your heart test was normal, your chest x-ray was normal, your EKG was unremarkable.  Please take Mobic, twice a day as needed for pain  See your doctor in the office for ongoing symptoms, emergency department for severe or worsening pain, difficulty breathing or any other worsening symptoms.

## 2018-02-09 NOTE — ED Triage Notes (Signed)
Pt c/o CP that radiates into back that started after turning over in the bed. Worse with palpation and deep breathing. Denies cardiac hx.

## 2018-02-20 ENCOUNTER — Emergency Department (HOSPITAL_COMMUNITY): Payer: Medicare Other

## 2018-02-20 ENCOUNTER — Other Ambulatory Visit: Payer: Self-pay

## 2018-02-20 ENCOUNTER — Emergency Department (HOSPITAL_COMMUNITY)
Admission: EM | Admit: 2018-02-20 | Discharge: 2018-02-20 | Disposition: A | Discharge status: Home / Self Care | Payer: Medicare Other | Attending: Emergency Medicine | Admitting: Emergency Medicine

## 2018-02-20 ENCOUNTER — Encounter (HOSPITAL_COMMUNITY): Payer: Self-pay | Admitting: Emergency Medicine

## 2018-02-20 DIAGNOSIS — M79674 Pain in right toe(s): Secondary | ICD-10-CM | POA: Diagnosis not present

## 2018-02-20 DIAGNOSIS — Z8541 Personal history of malignant neoplasm of cervix uteri: Secondary | ICD-10-CM | POA: Diagnosis not present

## 2018-02-20 DIAGNOSIS — R52 Pain, unspecified: Secondary | ICD-10-CM

## 2018-02-20 DIAGNOSIS — M79671 Pain in right foot: Secondary | ICD-10-CM | POA: Diagnosis not present

## 2018-02-20 MED ORDER — IBUPROFEN 400 MG PO TABS: 400.0000 mg | ORAL_TABLET | Freq: Four times a day (QID) | ORAL | 0 refills | Status: DC | PRN

## 2018-02-20 MED ORDER — ACETAMINOPHEN 500 MG PO TABS: 500.0000 mg | ORAL_TABLET | Freq: Four times a day (QID) | ORAL | 0 refills | Status: DC | PRN

## 2018-02-20 NOTE — ED Notes (Signed)
Patient transported to X-ray 

## 2018-02-20 NOTE — Discharge Instructions (Addendum)
Your x-ray today did not show any sign of fracture or dislocation.  You did have mild arthritis to the heel.  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  You should follow-up with an orthopedist or podiatrist for reevaluation of your symptoms.  In the meantime: Stop activities that may be causing pain. Elevate your foot when resting. Massage your foot. Apply ice to the injured area: Put ice in a plastic bag. Place a towel between your skin and the bag. Leave the ice on for 20 minutes, 2-3 times a day. Alternate 400 mg of ibuprofen and [phone] mg of Tylenol every 3 hours as needed for pain. Do not exceed 4000 mg of Tylenol daily.  Take ibuprofen with food to avoid upset stomach issues.  Return to the emergency department if any concerning signs or symptoms develop such as fevers, severe swelling, streaking of redness up the leg, loss of pulses, or weakness.

## 2018-02-20 NOTE — ED Triage Notes (Signed)
Pt borrowed a friend's boots for work, states they were too small but wore them for two weeks. Pt now c/o of right toe pain x 1 week.

## 2018-02-20 NOTE — ED Provider Notes (Signed)
[facility] EMERGENCY DEPARTMENT Provider Note   CSN: [id number] Arrival date & time: 02/20/18  1646     History   Chief Complaint Chief Complaint  Patient presents with  . Toe Pain    HPI Rebecca Osborne is a 67 y.o. female with history of GERD and osteopenia presents for evaluation of acute onset, progressively worsening right foot pain for 3 weeks.  She states that 3 weeks ago she tore her steel toe boots at work and borrowed her friend's boots.  She states she typically wears a size 8 wide but her friend's boots were a size 8 medium and were too narrow for her feet.  She states that her toes were "all squished together" and she thinks this caused her symptoms because she has had pain between the right second and third digits for 3 weeks.  Pain is constant, sharp and burning and radiates to the ball of the foot.  Pain worsens with ambulation and she notes that she is ambulating with a limp.  Denies numbness or tingling.  No fevers.  She has soaked her feet in Epsom salts with mild temporary improvement in her symptoms.  The history is provided by the patient.    Past Medical History:  Diagnosis Date  . Allergy   . Cancer (HCC)    cervical cancer cells  . GERD (gastroesophageal reflux disease)   . Osteopenia     Patient Active Problem List   Diagnosis Date Noted  . Small bowel obstruction due to adhesions ([place]) 06/22/2016  . GERD 08/31/2009  . DYSPHAGIA UNSPECIFIED 08/31/2009    Past Surgical History:  Procedure Laterality Date  . ABDOMINAL HYSTERECTOMY    . BOWEL RESECTION N/A 06/22/2016   Procedure: SMALL BOWEL RESECTION;  Surgeon: Vickie Epley, MD;  Location: [facility];  Service: General;  Laterality: N/A;  . CHOLECYSTECTOMY  2005  . FRACTURE SURGERY     rt leg  . LAPAROTOMY N/A 06/22/2016   Procedure: EXPLORATORY LAPAROTOMY;  Surgeon: Vickie Epley, MD;  Location: [facility];  Service: General;  Laterality: N/A;  . LYSIS OF ADHESION N/A 06/22/2016   Procedure: LYSIS  OF ADHESION;  Surgeon: Vickie Epley, MD;  Location: [facility];  Service: General;  Laterality: N/A;     OB History    Gravida      Para      Term      Preterm      AB      Living  3     SAB      TAB      Ectopic      Multiple      Live Births               Home Medications    Prior to Admission medications   Medication Sig Start Date End Date Taking? Authorizing Provider  acetaminophen (TYLENOL) 500 MG tablet Take 1 tablet (500 mg total) by mouth every 6 (six) hours as needed. 02/20/18   Kyliegh Jester A, PA-C  Calcium Citrate-Vitamin D (CALCIUM + D PO) Take 2 tablets by mouth daily.    [provider]  ibuprofen (ADVIL,MOTRIN) 400 MG tablet Take 1 tablet (400 mg total) by mouth every 6 (six) hours as needed. 02/20/18   Tallen Schnorr A, PA-C  meloxicam (MOBIC) 7.5 MG tablet Take 2 tablets (15 mg total) by mouth daily. 02/09/18   Noemi Chapel, MD    Family History Family History  Problem Relation Age  of Onset  . COPD Mother   . Heart disease Mother   . Miscarriages / [place] Mother   . Vision loss Mother        from shingles  . COPD Father   . Cancer Brother        leukemia  . Early death Brother     Social History Social History   Tobacco Use  . Smoking status: Never Smoker  . Smokeless tobacco: Never Used  Substance Use Topics  . Alcohol use: No  . Drug use: No     Allergies   Codeine   Review of Systems Review of Systems  Cardiovascular: Negative for leg swelling.  Musculoskeletal: Positive for arthralgias.  Neurological: Negative for weakness and numbness.     Physical Exam Updated Vital Signs BP 117/62 (BP Location: Right Arm)   Pulse 83   Temp 98 F (36.7 C) (Oral)   Resp 18   Ht 5' 1"  (1.549 m)   Wt 59 kg   SpO2 96%   BMI 24.56 kg/m   Physical Exam  Constitutional: She appears well-developed and well-nourished. No distress.  HENT:  Head: Normocephalic and atraumatic.  Eyes: Conjunctivae are normal. Right eye  exhibits no discharge. Left eye exhibits no discharge.  Neck: No JVD present. No tracheal deviation present.  Cardiovascular: Normal rate and intact distal pulses.  2+ DP/PT pulses bilaterally.  No lower extremity edema.  No palpable cords.  Compartments are soft.  Homans sign absent bilaterally.  Pulmonary/Chest: Effort normal.  Abdominal: She exhibits no distension.  Musculoskeletal: Normal range of motion. She exhibits tenderness. She exhibits no edema.  Focal tenderness to palpation between the right second and third digits.  No erythema, crepitus, or deformity.  5/5 strength of bilateral lower extremity major muscle groups.  Neurological: She is alert.  Fluent speech, no facial droop, sensation intact to soft touch of bilateral lower extremities.  Ambulates with an antalgic gait, electing to place most of her weight on her right heel.  Able to Heel Walk and Toe Walk without difficulty  Skin: Skin is warm and dry. No erythema.  Psychiatric: She has a normal mood and affect. Her behavior is normal.  Nursing note and vitals reviewed.    ED Treatments / Results  Labs (all labs ordered are listed, but only abnormal results are displayed) Labs Reviewed - No data to display  EKG None  Radiology Dg Foot Complete Right  Result Date: 02/20/2018 CLINICAL DATA:  Pain at the great toe and metatarsals EXAM: RIGHT FOOT COMPLETE - 3+ VIEW COMPARISON:  None. FINDINGS: No fracture or malalignment. Small plantar calcaneal spur. No radiopaque foreign body IMPRESSION: No acute osseous abnormality. Electronically Signed   By: Donavan Foil M.D.   On: 02/20/2018 17:48    Procedures Procedures (including critical care time)  Medications Ordered in ED Medications - No data to display   Initial Impression / Assessment and Plan / ED Course  I have reviewed the triage vital signs and the nursing notes.  Pertinent labs & imaging results that were available during my care of the patient were reviewed  by me and considered in my medical decision making (see chart for details).     Patient presents with right foot pain for 3 weeks which began after wearing shoes that were too narrow for her.  Denies any known trauma otherwise.  She is afebrile, vital signs are stable.  She is neurovascularly intact.  Compartments are soft.  No concern for DVT  or superimposed skin infection.  No concern for septic joint.  Presentation appears consistent with Morton's neuroma, exacerbated by wearing ill fitting shoes.  Radiographs show small plantar calcaneal spur but no acute osseous abnormality.  RICE therapy indicated and discussed with patient.  Offered brace and crutches in the ED which patient declined.  Recommend follow-up with podiatry or orthopedics for reevaluation.  Discussed strict ED return precautions. Pt verbalized understanding of and agreement with plan and is safe for discharge home at this time.  No complaints prior to discharge.  Final Clinical Impressions(s) / ED Diagnoses   Final diagnoses:  Acute foot pain, right    ED Discharge Orders         Ordered    ibuprofen (ADVIL,MOTRIN) 400 MG tablet  Every 6 hours PRN     02/20/18 1758    acetaminophen (TYLENOL) 500 MG tablet  Every 6 hours PRN     02/20/18 1758           Renita Papa, PA-C 02/20/18 1912    Noemi Chapel, MD 02/21/18 [phone]

## 2018-02-26 ENCOUNTER — Ambulatory Visit (INDEPENDENT_AMBULATORY_CARE_PROVIDER_SITE_OTHER): Payer: Medicare Other

## 2018-02-26 ENCOUNTER — Ambulatory Visit (INDEPENDENT_AMBULATORY_CARE_PROVIDER_SITE_OTHER): Payer: Medicare Other | Admitting: Podiatry

## 2018-02-26 ENCOUNTER — Encounter: Payer: Self-pay | Admitting: Podiatry

## 2018-02-26 VITALS — BP 115/66 | HR 76

## 2018-02-26 DIAGNOSIS — M79674 Pain in right toe(s): Secondary | ICD-10-CM

## 2018-02-26 DIAGNOSIS — M79671 Pain in right foot: Secondary | ICD-10-CM

## 2018-02-26 DIAGNOSIS — L03031 Cellulitis of right toe: Secondary | ICD-10-CM

## 2018-02-26 DIAGNOSIS — M7989 Other specified soft tissue disorders: Secondary | ICD-10-CM

## 2018-02-26 DIAGNOSIS — L02611 Cutaneous abscess of right foot: Secondary | ICD-10-CM

## 2018-02-26 MED ORDER — MELOXICAM 7.5 MG PO TABS: 15.0000 mg | ORAL_TABLET | Freq: Every day | ORAL | 0 refills | Status: DC

## 2018-02-26 MED ORDER — CEPHALEXIN 500 MG PO CAPS: 500.0000 mg | ORAL_CAPSULE | Freq: Two times a day (BID) | ORAL | 0 refills | Status: DC

## 2018-02-28 NOTE — Progress Notes (Signed)
Subjective: 67 year old female presents the office today for concerns of swelling, pain to the right foot.  This is been ongoing for the last 2 to 3 weeks.  She says it started because her steel toed shoes that were worn out she started wearing a friend still toed shoe.  She states that since doing so she had significant increase in pain and swelling to her foot.  She has been out of work for the last week because of pain.  She denies any recent injury or falls and she denies any open sores, blisters.  She does that occasionally her foot will get red. Denies any systemic complaints such as fevers, chills, nausea, vomiting. No acute changes since last appointment, and no other complaints at this time.   Objective: AAO x3, NAD DP/PT pulses palpable bilaterally, CRT less than 3 seconds There is moderate edema to the right second toe there is tenderness palpation of the toe and is also some mild swelling on the second MPJ and pain to this area as well.  There is mild increased warmth of the toe.  There is no open sores there is no blister formation.  There is no fluctuation or crepitation.  There is no ascending cellulitis.  There is no malodor. No open lesions or pre-ulcerative lesions.  No pain with calf compression, swelling, warmth, erythema  Assessment: Swelling right foot  Plan: -All treatment options discussed with the patient including all alternatives, risks, complications.  -Repeat x-rays were obtained which did not reveal any evidence of acute fracture or stress fracture or any definitive absence of acute osteomyelitis. -Given the pain is when she is having concerned about possible infection.  I did put her into a surgical shoe to immobilize the area and I prescribed Keflex for her.  Also prescribed meloxicam discussed side effects of medication.  Elevation.  Recommended to hold off on go back to work.  I will see her back in 1 week for follow-up or sooner if needed. -Patient encouraged to  call the office with any questions, concerns, change in symptoms.   Trula Slade DPM

## 2018-03-05 ENCOUNTER — Ambulatory Visit (INDEPENDENT_AMBULATORY_CARE_PROVIDER_SITE_OTHER): Payer: Medicare Other

## 2018-03-05 ENCOUNTER — Other Ambulatory Visit: Payer: Self-pay | Admitting: Podiatry

## 2018-03-05 ENCOUNTER — Ambulatory Visit (INDEPENDENT_AMBULATORY_CARE_PROVIDER_SITE_OTHER): Payer: Medicare Other | Admitting: Podiatry

## 2018-03-05 VITALS — BP 107/66 | HR 77 | Temp 97.4°F

## 2018-03-05 DIAGNOSIS — L03031 Cellulitis of right toe: Secondary | ICD-10-CM | POA: Diagnosis not present

## 2018-03-05 DIAGNOSIS — M84374A Stress fracture, right foot, initial encounter for fracture: Secondary | ICD-10-CM | POA: Diagnosis not present

## 2018-03-05 DIAGNOSIS — L02611 Cutaneous abscess of right foot: Secondary | ICD-10-CM

## 2018-03-05 DIAGNOSIS — S92514D Nondisplaced fracture of proximal phalanx of right lesser toe(s), subsequent encounter for fracture with routine healing: Secondary | ICD-10-CM

## 2018-03-05 DIAGNOSIS — M79671 Pain in right foot: Secondary | ICD-10-CM

## 2018-03-05 NOTE — Progress Notes (Signed)
Subjective: 67 year old female presents the office today for follow-up evaluation swelling to right foot.  She said overall her symptoms are unchanged.  She is wearing the surgical shoe when she is been taking antibiotics as well as anti-inflammatories of any significant change.  She denies any open sores or warmth to the foot. Denies any systemic complaints such as fevers, chills, nausea, vomiting. No acute changes since last appointment, and no other complaints at this time.   Objective: AAO x3, NAD DP/PT pulses palpable bilaterally, CRT less than 3 seconds #3 acute left second toe and there is some pink, erythematous skin on the plantar aspect of the toe but this is likely from where the skin is peeled off this is new, pink skin present.  There is no open sores otherwise there is no drainage or pus there is no increase in warmth of the foot there is no ascending cellulitis.  There is edema most of the second toe as well as the forefoot area.  Most of the tenderness is on the second toe as well.  Mild tenderness to the submetatarsal and metatarsophalangeal joints. No open lesions or pre-ulcerative lesions.  No pain with calf compression, swelling, warmth, erythema  Assessment: Continued swelling, pain right foot  Plan: -All treatment options discussed with the patient including all alternatives, risks, complications.  -Repeat x-rays were obtained reviewed.  There is a question of a lucency in the proximal phalanx of the second toe concerning for fracture. -Given x-ray findings continue with surgical shoe as well as given the continued swelling and pain.  Muscular check blood work including ESR, CRP, CBC.  We will continue current medication course for now but pending the blood work will likely stop the antibiotics and do a Medrol Dosepak given the amount of swelling the pain that she is having.  If symptoms continue also consider MRI. I added a PegAssit to the [place] shoe -Patient encouraged to  call the office with any questions, concerns, change in symptoms.   Trula Slade DPM

## 2018-03-06 ENCOUNTER — Telehealth: Payer: Self-pay | Admitting: *Deleted

## 2018-03-06 ENCOUNTER — Telehealth: Payer: Self-pay | Admitting: Podiatry

## 2018-03-06 DIAGNOSIS — L02611 Cutaneous abscess of right foot: Secondary | ICD-10-CM | POA: Diagnosis not present

## 2018-03-06 DIAGNOSIS — L03031 Cellulitis of right toe: Secondary | ICD-10-CM | POA: Diagnosis not present

## 2018-03-06 MED ORDER — METHYLPREDNISOLONE 4 MG PO TBPK: ORAL_TABLET | ORAL | 0 refills | Status: DC

## 2018-03-06 NOTE — Telephone Encounter (Signed)
Called and spoke with the patient and stated that Dr Jacqualyn Posey wanted to get the blood work done and patient stated that she would be by after 2:00 today. Lattie Haw

## 2018-03-06 NOTE — Addendum Note (Signed)
Addended by: Harriett Sine D on: 03/06/2018 02:50 PM   Modules accepted: Orders

## 2018-03-06 NOTE — Telephone Encounter (Signed)
Pt presents to office. I printed labs and gave to pt and told her I would order the medication Dr. Jacqualyn Posey had wanted ordered 03/05/2018 at [place].

## 2018-03-06 NOTE — Telephone Encounter (Signed)
Pt. Said you were suppose to send in some medication for her and she would like for you to send it to [facility] on [address]. She was also calling about some blood work she was suppose to have.

## 2018-03-07 LAB — CBC WITH DIFFERENTIAL/PLATELET
BASOS ABS: 33 {cells}/uL (ref 0–200)
BASOS PCT: 0.4 %
EOS ABS: 107 {cells}/uL (ref 15–500)
Eosinophils Relative: 1.3 %
HEMATOCRIT: 33.2 % — AB (ref 35.0–45.0)
HEMOGLOBIN: 11.2 g/dL — AB (ref 11.7–15.5)
LYMPHS ABS: 1886 {cells}/uL (ref 850–3900)
MCH: 29.9 pg (ref 27.0–33.0)
MCHC: 33.7 g/dL (ref 32.0–36.0)
MCV: 88.5 fL (ref 80.0–100.0)
MPV: 10.4 fL (ref 7.5–12.5)
Monocytes Relative: 5.7 %
NEUTROS ABS: 5707 {cells}/uL (ref 1500–7800)
Neutrophils Relative %: 69.6 %
Platelets: 290 10*3/uL (ref 140–400)
RBC: 3.75 10*6/uL — ABNORMAL LOW (ref 3.80–5.10)
RDW: 12.1 % (ref 11.0–15.0)
Total Lymphocyte: 23 %
WBC mixed population: 467 cells/uL (ref 200–950)
WBC: 8.2 10*3/uL (ref 3.8–10.8)

## 2018-03-07 LAB — SEDIMENTATION RATE: Sed Rate: 36 mm/h — ABNORMAL HIGH (ref 0–30)

## 2018-03-07 LAB — C-REACTIVE PROTEIN: CRP: 7.3 mg/L (ref <8.0)

## 2018-03-10 DIAGNOSIS — Z23 Encounter for immunization: Secondary | ICD-10-CM | POA: Diagnosis not present

## 2018-03-12 ENCOUNTER — Ambulatory Visit (INDEPENDENT_AMBULATORY_CARE_PROVIDER_SITE_OTHER): Payer: Medicare Other | Admitting: Podiatry

## 2018-03-12 DIAGNOSIS — M79674 Pain in right toe(s): Secondary | ICD-10-CM

## 2018-03-12 DIAGNOSIS — M7989 Other specified soft tissue disorders: Secondary | ICD-10-CM | POA: Diagnosis not present

## 2018-03-12 DIAGNOSIS — S92514D Nondisplaced fracture of proximal phalanx of right lesser toe(s), subsequent encounter for fracture with routine healing: Secondary | ICD-10-CM | POA: Diagnosis not present

## 2018-03-12 DIAGNOSIS — T148XXA Other injury of unspecified body region, initial encounter: Secondary | ICD-10-CM | POA: Diagnosis not present

## 2018-03-12 NOTE — Progress Notes (Signed)
Subjective: 67 year old female presents the office today for follow-up evaluation swelling to right foot.  She states overall the swelling has improved since then the steroids but she still has pain in the joint pain is to the ball of the foot.  She has minimal discomfort in the toes itself and she points on the submetatarsal area she is majority discomfort.  She has tried going without the surgical shoe and she states it does hurt.  She is getting frustrated because she cannot return to work and she is needed back to get the income.  Objective: AAO x3, NAD DP/PT pulses palpable bilaterally, CRT less than 3 seconds On the right foot there is tenderness submetatarsal 2, 3 area there is edema mostly to this area.  Overall edema to the second toe is much improved.  There is no area pinpoint tenderness identified at this time.  There is no erythema or increase in warmth.  There is no open lesions. No open lesions or pre-ulcerative lesions.  No pain with calf compression, swelling, warmth, erythema  Assessment: Continued swelling, pain right foot  Plan: -All treatment options discussed with the patient including all alternatives, risks, complications.  -At this point we discussed again the findings on x-ray as well as blood work.  Given her continued symptoms and is mostly on the submetatarsal area x-rays were negative x2 and will order an MRI of the foot.  This is related tendon tear or other pathology as x-rays were negative and she continues to have swelling and pain.  Continue surgical shoe with offloading pads as this is becoming very helpful for her.  I dispensed she will offloading pads for her as well.  Discussed with her that if her symptoms improve she can get back into a steel toe shoe she can use a super offloading return to work however we are going to order the MRI as well given her ongoing pain and swelling. -Information for the MRI given to [facility] for any pain  Trula Slade DPM

## 2018-03-13 NOTE — Addendum Note (Signed)
Addended by: Harriett Sine D on: 03/13/2018 10:33 AM   Modules accepted: Orders

## 2018-03-15 ENCOUNTER — Ambulatory Visit (HOSPITAL_COMMUNITY)
Admission: RE | Admit: 2018-03-15 | Discharge: 2018-03-15 | Disposition: A | Discharge status: Home / Self Care | Payer: Medicare Other | Source: Ambulatory Visit | Attending: Podiatry | Admitting: Podiatry

## 2018-03-15 DIAGNOSIS — M7989 Other specified soft tissue disorders: Secondary | ICD-10-CM | POA: Diagnosis not present

## 2018-03-15 DIAGNOSIS — T148XXA Other injury of unspecified body region, initial encounter: Secondary | ICD-10-CM

## 2018-03-15 DIAGNOSIS — R600 Localized edema: Secondary | ICD-10-CM | POA: Diagnosis not present

## 2018-03-15 DIAGNOSIS — M79674 Pain in right toe(s): Secondary | ICD-10-CM | POA: Diagnosis not present

## 2018-03-15 DIAGNOSIS — S92514D Nondisplaced fracture of proximal phalanx of right lesser toe(s), subsequent encounter for fracture with routine healing: Secondary | ICD-10-CM

## 2018-03-20 ENCOUNTER — Other Ambulatory Visit: Payer: Self-pay | Admitting: Podiatry

## 2018-03-20 ENCOUNTER — Telehealth: Payer: Self-pay | Admitting: Podiatry

## 2018-03-20 MED ORDER — AMOXICILLIN-POT CLAVULANATE 875-125 MG PO TABS: 1.0000 | ORAL_TABLET | Freq: Two times a day (BID) | ORAL | 0 refills | Status: DC

## 2018-03-20 NOTE — Progress Notes (Signed)
Called patient to go over MRI results. She states that the foot got more swollen last night and now she has a small red streak on the top of the foot. She has had this before and has gone away. She has no other symptoms. Previous blood work was mostly unremarkable. No open wounds. Will call in Augmentin to start but if any worsening she needs to go to the ER. Will call tomorrow to see how she is doing and I want to see her this week.

## 2018-03-21 ENCOUNTER — Telehealth: Payer: Self-pay | Admitting: *Deleted

## 2018-03-21 NOTE — Telephone Encounter (Signed)
Called and the patient yesterday and the patient had a lot of questions when I called and I stated that I would get Dr Jacqualyn Posey on the phone to help answer any questions the patient had. Rebecca Osborne

## 2018-03-24 ENCOUNTER — Ambulatory Visit (INDEPENDENT_AMBULATORY_CARE_PROVIDER_SITE_OTHER): Payer: Medicare Other | Admitting: Podiatry

## 2018-03-24 ENCOUNTER — Ambulatory Visit (INDEPENDENT_AMBULATORY_CARE_PROVIDER_SITE_OTHER): Payer: Medicare Other

## 2018-03-24 ENCOUNTER — Encounter: Payer: Self-pay | Admitting: Podiatry

## 2018-03-24 DIAGNOSIS — S92514D Nondisplaced fracture of proximal phalanx of right lesser toe(s), subsequent encounter for fracture with routine healing: Secondary | ICD-10-CM | POA: Diagnosis not present

## 2018-03-24 DIAGNOSIS — R609 Edema, unspecified: Secondary | ICD-10-CM

## 2018-03-25 NOTE — Progress Notes (Signed)
Subjective: Rebecca Osborne presents the office today for follow-up evaluation of swelling and pain to the right foot.  Majority pain is submetatarsal area mostly on the second and third metatarsal heads that she points to.  She states that she still being swelling to the foot.  Pain is somewhat improved.  She states that she previously had a red streak without resolved.  She states that is intermittent as well and will come and go.  No recent injury.  She has not yet tried to wear her work boots.  Has not been able to return to work as she cannot wear her work shoes.  She is been taking the antibiotic as directed without any side effects.  Denies any systemic complaints such as fevers, chills, nausea, vomiting. No acute changes since last appointment, and no other complaints at this time.   Objective: AAO x3, NAD DP/PT pulses palpable bilaterally, CRT less than 3 seconds Overall appears of swelling is decreased unable to visualize skin lines today.  There is still some swelling to the plantar aspect of the foot submetatarsal 2 and 3 area.  There is no area of fluctuation or crepitation.  Is no open sores identified.  Mild swelling to the second toe as well.  There are no other areas of tenderness to the foot or ankle. No open lesions or pre-ulcerative lesions.  No pain with calf compression, swelling, warmth, erythema  Assessment: Continued swelling, pain with active stress fracture second toe  Plan: -All treatment options discussed with the patient including all alternatives, risks, complications.  -This is been a difficult case.  She is unable to case of infection although not certain that this is a true infection I think this is more of a stress fracture given the change in shoes that she had and she has no open sores.  I did apply an Haematologist today.  I encouraged elevation as well as anti-inflammatories to continue with as well.  Finish the course of antibiotics.  Continue with surgical shoe for now.   Precautions were advised on when to remove the [employer].  I will see her back in 1 week.  At that point I want her to also see somebody else for another opinion. -Patient encouraged to call the office with any questions, concerns, change in symptoms.   Trula Slade DPM

## 2018-04-02 ENCOUNTER — Ambulatory Visit (INDEPENDENT_AMBULATORY_CARE_PROVIDER_SITE_OTHER): Payer: Medicare Other | Admitting: Podiatry

## 2018-04-02 ENCOUNTER — Ambulatory Visit (INDEPENDENT_AMBULATORY_CARE_PROVIDER_SITE_OTHER): Payer: Medicare Other

## 2018-04-02 ENCOUNTER — Encounter: Payer: Self-pay | Admitting: Podiatry

## 2018-04-02 DIAGNOSIS — M84377A Stress fracture, right toe(s), initial encounter for fracture: Secondary | ICD-10-CM

## 2018-04-02 DIAGNOSIS — M779 Enthesopathy, unspecified: Secondary | ICD-10-CM

## 2018-04-02 DIAGNOSIS — R609 Edema, unspecified: Secondary | ICD-10-CM

## 2018-04-02 DIAGNOSIS — M7751 Other enthesopathy of right foot: Secondary | ICD-10-CM | POA: Diagnosis not present

## 2018-04-06 ENCOUNTER — Ambulatory Visit: Payer: Medicare Other | Admitting: Podiatry

## 2018-04-09 ENCOUNTER — Telehealth: Payer: Self-pay | Admitting: Podiatry

## 2018-04-09 NOTE — Telephone Encounter (Signed)
Patient called and stated she still had her soft cast on and her feet still was swelling. I was advised by Val to let her go ahead and take the cast off and if she still has some swelling please have her come in for an appointment on the nurse schedule to get another soft cast out on until her next appointment with Dr. Jacqualyn Posey on 04/16/2018.

## 2018-04-10 NOTE — Progress Notes (Signed)
Subjective: 67 year old female presents the office today for follow-up evaluation of right foot pain, swelling.  She says the swelling has improved some but she had to take the Unna boot off as it was getting too tight.  She states that she has not been able to go back into a shoe still.  She has no other concerns today.  Overall not drastic improvement. Denies any systemic complaints such as fevers, chills, nausea, vomiting. No acute changes since last appointment, and no other complaints at this time.   Objective: AAO x3, NAD DP/PT pulses palpable bilaterally, CRT less than 3 seconds Majority of tenderness mostly on the plantar aspect submetatarsal 2 area.  There is no areas of erythema there is no fluctuation or crepitation.  Mild swelling to the second toe as well but again there is no open wounds or signs of infection.  Tenderness mostly on the second and third interspaces. No open lesions or pre-ulcerative lesions.  No pain with calf compression, swelling, warmth, erythema  Assessment: Likely stress reaction second toe with tendinitis  Plan: -All treatment options discussed with the patient including all alternatives, risks, complications.  -Repeat x-rays were obtained reviewed.  No definitive evidence of acute fracture identified.  No evidence of osteomyelitis. -I had Dr. Amalia Hailey also come and evaluate the patient independently.  He had no further opinions.  Today an Unna boot was applied however prior to this we did a steroid injection of the second and third interspace without any complications.  The skin was prepped with alcohol and a mixture of 1 cc Kenalog 10, 0.5 cc of 0.5% Marcaine plain 0.5 cc of 2% lidocaine plain was infiltrated into the second and third interspace without complications.  Postinjection care was discussed.  Continue ice elevate.  She was to hold off any medication at this time due to cost. -Patient encouraged to call the office with any questions, concerns, change in  symptoms.   Trula Slade DPM

## 2018-04-11 ENCOUNTER — Telehealth: Payer: Self-pay | Admitting: *Deleted

## 2018-04-11 NOTE — Telephone Encounter (Signed)
Called again today and the patient is icing and using the surgical shoe and elevating and the swelling has gone down and I stated to call the office if any questions or concerns. Lattie Haw

## 2018-04-11 NOTE — Telephone Encounter (Signed)
Called patient yesterday and the patient stated that she was getting out of the shower and I stated that I was calling to check on her and the patient stated that the surgical shoe was helping but still swelling but not as bad and I stated to call the office if any concerns or questions. Lattie Haw

## 2018-04-16 ENCOUNTER — Ambulatory Visit (INDEPENDENT_AMBULATORY_CARE_PROVIDER_SITE_OTHER): Payer: Medicare Other | Admitting: Podiatry

## 2018-04-16 ENCOUNTER — Encounter: Payer: Self-pay | Admitting: Podiatry

## 2018-04-16 DIAGNOSIS — G9050 Complex regional pain syndrome I, unspecified: Secondary | ICD-10-CM | POA: Diagnosis not present

## 2018-04-16 DIAGNOSIS — M779 Enthesopathy, unspecified: Secondary | ICD-10-CM | POA: Diagnosis not present

## 2018-04-16 MED ORDER — ALPRAZOLAM 0.25 MG PO TABS: 0.2500 mg | ORAL_TABLET | Freq: Two times a day (BID) | ORAL | 0 refills | Status: DC | PRN

## 2018-04-16 MED ORDER — GABAPENTIN 100 MG PO CAPS: 100.0000 mg | ORAL_CAPSULE | Freq: Three times a day (TID) | ORAL | 0 refills | Status: DC

## 2018-04-16 NOTE — Patient Instructions (Signed)
Alprazolam tablets What is this medicine? ALPRAZOLAM (al PRAY zoe lam) is a benzodiazepine. It is used to treat anxiety and panic attacks. This medicine may be used for other purposes; ask your health care provider or pharmacist if you have questions. COMMON BRAND NAME(S): Xanax What should I tell my health care provider before I take this medicine? They need to know if you have any of these conditions: -an alcohol or drug abuse problem -bipolar disorder, depression, psychosis or other mental health conditions -glaucoma -kidney or liver disease -lung or breathing disease -myasthenia gravis -Parkinson's disease -porphyria -seizures or a history of seizures -suicidal thoughts -an unusual or allergic reaction to alprazolam, other benzodiazepines, foods, dyes, or preservatives -pregnant or trying to get pregnant -breast-feeding How should I use this medicine? Take this medicine by mouth with a glass of water. Follow the directions on the prescription label. Take your medicine at regular intervals. Do not take it more often than directed. Do not stop taking except on your doctor's advice. A special MedGuide will be given to you by the pharmacist with each prescription and refill. Be sure to read this information carefully each time. Talk to your pediatrician regarding the use of this medicine in children. Special care may be needed. Overdosage: If you think you have taken too much of this medicine contact a poison control center or emergency room at once. NOTE: This medicine is only for you. Do not share this medicine with others. What if I miss a dose? If you miss a dose, take it as soon as you can. If it is almost time for your next dose, take only that dose. Do not take double or extra doses. What may interact with this medicine? Do not take this medicine with any of the following medications: -certain antiviral medicines for HIV or AIDS like delavirdine, indinavir -certain medicines for  fungal infections like ketoconazole and itraconazole -narcotic medicines for cough -sodium oxybate This medicine may also interact with the following medications: -alcohol -antihistamines for allergy, cough and cold -certain antibiotics like clarithromycin, erythromycin, isoniazid, rifampin, rifapentine, rifabutin, and troleandomycin -certain medicines for blood pressure, heart disease, irregular heart beat -certain medicines for depression, like amitriptyline, fluoxetine, sertraline -certain medicines for seizures like carbamazepine, oxcarbazepine, phenobarbital, phenytoin, primidone -cimetidine -cyclosporine -female hormones, like estrogens or progestins and birth control pills, patches, rings, or injections -general anesthetics like halothane, isoflurane, methoxyflurane, propofol -grapefruit juice -local anesthetics like lidocaine, pramoxine, tetracaine -medicines that relax muscles for surgery -narcotic medicines for pain -other antiviral medicines for HIV or AIDS -phenothiazines like chlorpromazine, mesoridazine, prochlorperazine, thioridazine This list may not describe all possible interactions. Give your health care provider a list of all the medicines, herbs, non-prescription drugs, or dietary supplements you use. Also tell them if you smoke, drink alcohol, or use illegal drugs. Some items may interact with your medicine. What should I watch for while using this medicine? Tell your doctor or health care professional if your symptoms do not start to get better or if they get worse. Do not stop taking except on your doctor's advice. You may develop a severe reaction. Your doctor will tell you how much medicine to take. You may get drowsy or dizzy. Do not drive, use machinery, or do anything that needs mental alertness until you know how this medicine affects you. To reduce the risk of dizzy and fainting spells, do not stand or sit up quickly, especially if you are an older patient.  Alcohol may increase dizziness and drowsiness. Avoid alcoholic  drinks. If you are taking another medicine that also causes drowsiness, you may have more side effects. Give your health care provider a list of all medicines you use. Your doctor will tell you how much medicine to take. Do not take more medicine than directed. Call emergency for help if you have problems breathing or unusual sleepiness. What side effects may I notice from receiving this medicine? Side effects that you should report to your doctor or health care professional as soon as possible: -allergic reactions like skin rash, itching or hives, swelling of the face, lips, or tongue -breathing problems -confusion -loss of balance or coordination -signs and symptoms of low blood pressure like dizziness; feeling faint or lightheaded, falls; unusually weak or tired -suicidal thoughts or other mood changes Side effects that usually do not require medical attention (report to your doctor or health care professional if they continue or are bothersome): -dizziness -dry mouth -nausea, vomiting -tiredness This list may not describe all possible side effects. Call your doctor for medical advice about side effects. You may report side effects to FDA at 1-800-FDA-1088. Where should I keep my medicine? Keep out of the reach of children. This medicine can be abused. Keep your medicine in a safe place to protect it from theft. Do not share this medicine with anyone. Selling or giving away this medicine is dangerous and against the law. Store at room temperature between 20 and 25 degrees C (68 and 77 degrees F). This medicine may cause accidental overdose and death if taken by other adults, children, or pets. Mix any unused medicine with a substance like cat litter or coffee grounds. Then throw the medicine away in a sealed container like a sealed bag or a coffee can with a lid. Do not use the medicine after the expiration date. NOTE: This sheet is  a summary. It may not cover all possible information. If you have questions about this medicine, talk to your doctor, pharmacist, or health care provider.  2018 Elsevier/Gold Standard (2015-02-19 13:47:25)   Gabapentin capsules or tablets What is this medicine? GABAPENTIN (GA ba pen tin) is used to control partial seizures in adults with epilepsy. It is also used to treat certain types of nerve pain. This medicine may be used for other purposes; ask your health care provider or pharmacist if you have questions. COMMON BRAND NAME(S): Active-PAC with Gabapentin, Gabarone, Neurontin What should I tell my health care provider before I take this medicine? They need to know if you have any of these conditions: -kidney disease -suicidal thoughts, plans, or attempt; a previous suicide attempt by you or a family member -an unusual or allergic reaction to gabapentin, other medicines, foods, dyes, or preservatives -pregnant or trying to get pregnant -breast-feeding How should I use this medicine? Take this medicine by mouth with a glass of water. Follow the directions on the prescription label. You can take it with or without food. If it upsets your stomach, take it with food.Take your medicine at regular intervals. Do not take it more often than directed. Do not stop taking except on your doctor's advice. If you are directed to break the 600 or 800 mg tablets in half as part of your dose, the extra half tablet should be used for the next dose. If you have not used the extra half tablet within 28 days, it should be thrown away. A special MedGuide will be given to you by the pharmacist with each prescription and refill. Be sure to read this  information carefully each time. Talk to your pediatrician regarding the use of this medicine in children. Special care may be needed. Overdosage: If you think you have taken too much of this medicine contact a poison control center or emergency room at once. NOTE:  This medicine is only for you. Do not share this medicine with others. What if I miss a dose? If you miss a dose, take it as soon as you can. If it is almost time for your next dose, take only that dose. Do not take double or extra doses. What may interact with this medicine? Do not take this medicine with any of the following medications: -other gabapentin products This medicine may also interact with the following medications: -alcohol -antacids -antihistamines for allergy, cough and cold -certain medicines for anxiety or sleep -certain medicines for depression or psychotic disturbances -homatropine; hydrocodone -naproxen -narcotic medicines (opiates) for pain -phenothiazines like chlorpromazine, mesoridazine, prochlorperazine, thioridazine This list may not describe all possible interactions. Give your health care provider a list of all the medicines, herbs, non-prescription drugs, or dietary supplements you use. Also tell them if you smoke, drink alcohol, or use illegal drugs. Some items may interact with your medicine. What should I watch for while using this medicine? Visit your doctor or health care professional for regular checks on your progress. You may want to keep a record at home of how you feel your condition is responding to treatment. You may want to share this information with your doctor or health care professional at each visit. You should contact your doctor or health care professional if your seizures get worse or if you have any new types of seizures. Do not stop taking this medicine or any of your seizure medicines unless instructed by your doctor or health care professional. Stopping your medicine suddenly can increase your seizures or their severity. Wear a medical identification bracelet or chain if you are taking this medicine for seizures, and carry a card that lists all your medications. You may get drowsy, dizzy, or have blurred vision. Do not drive, use machinery,  or do anything that needs mental alertness until you know how this medicine affects you. To reduce dizzy or fainting spells, do not sit or stand up quickly, especially if you are an older patient. Alcohol can increase drowsiness and dizziness. Avoid alcoholic drinks. Your mouth may get dry. Chewing sugarless gum or sucking hard candy, and drinking plenty of water will help. The use of this medicine may increase the chance of suicidal thoughts or actions. Pay special attention to how you are responding while on this medicine. Any worsening of mood, or thoughts of suicide or dying should be reported to your health care professional right away. Women who become pregnant while using this medicine may enroll in the [place] Pregnancy Registry by calling [phone]. This registry collects information about the safety of antiepileptic drug use during pregnancy. What side effects may I notice from receiving this medicine? Side effects that you should report to your doctor or health care professional as soon as possible: -allergic reactions like skin rash, itching or hives, swelling of the face, lips, or tongue -worsening of mood, thoughts or actions of suicide or dying Side effects that usually do not require medical attention (report to your doctor or health care professional if they continue or are bothersome): -constipation -difficulty walking or controlling muscle movements -dizziness -nausea -slurred speech -tiredness -tremors -weight gain This list may not describe all possible side effects.  Call your doctor for medical advice about side effects. You may report side effects to FDA at 1-800-FDA-1088. Where should I keep my medicine? Keep out of reach of children. This medicine may cause accidental overdose and death if it taken by other adults, children, or pets. Mix any unused medicine with a substance like cat litter or coffee grounds. Then throw the medicine away in a  sealed container like a sealed bag or a coffee can with a lid. Do not use the medicine after the expiration date. Store at room temperature between 15 and 30 degrees C (59 and 86 degrees F). NOTE: This sheet is a summary. It may not cover all possible information. If you have questions about this medicine, talk to your doctor, pharmacist, or health care provider.  2018 Elsevier/Gold Standard (2013-07-19 15:26:50)

## 2018-04-18 NOTE — Progress Notes (Signed)
Subjective:   67 year old female presents the office today for follow-up evaluation of right foot pain, swelling.  She states the injection did help some however she is unable to wear closed in shoe.  She is very anxious and nervous about this.  Overall she is had some minimal improvement in overall symptoms to continue.  She states this was intermittent.  She denies any open sores or any redness. Denies any systemic complaints such as fevers, chills, nausea, vomiting. No acute changes since last appointment, and no other complaints at this time.   Objective: AAO x3, NAD DP/PT pulses palpable bilaterally, CRT less than 3 seconds Majority of tenderness mostly on the plantar aspect submetatarsal 2 area.  She also has tenderness along the second and third metatarsal phalangeal joints.  There is still some mild edema to the dorsal foot overall the swelling is improved compared to last appointment but does continue.   There is slight color and temperature changes compared to the contralateral extremity. No open lesions or pre-ulcerative lesions.  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  Plan: -All treatment options discussed with the patient including all alternatives, risks, complications.  -Today had Dr. Paulla Dolly evaluate the patient as well.  He also has agreed and there is some component of CRPS.  However today he is going to try to drain the fluid in the second third metatarsal phalangeal joints.  The area is anesthetized with a mixture of lidocaine and Marcaine plain.  Once anesthetized he was able to draw clear fluid from the second MPJs.  There is no purulence or any signs of infection.  He injected dexamethasone into the joints without complications.  Compression bandage was applied. -I prescribed gabapentin.  Also for the anxiety was prescribed Xanax.  She is very anxious about the foot as we  contributed CRPS.. -Physical therapy prescription was [facility] for [facility] today as well.  Trula Slade DPM

## 2018-04-23 ENCOUNTER — Ambulatory Visit (INDEPENDENT_AMBULATORY_CARE_PROVIDER_SITE_OTHER): Payer: Medicare Other | Admitting: Podiatry

## 2018-04-23 ENCOUNTER — Encounter: Payer: Self-pay | Admitting: Podiatry

## 2018-04-23 DIAGNOSIS — M779 Enthesopathy, unspecified: Secondary | ICD-10-CM

## 2018-04-23 DIAGNOSIS — G9050 Complex regional pain syndrome I, unspecified: Secondary | ICD-10-CM

## 2018-04-30 NOTE — Progress Notes (Signed)
Subjective: 67 year old female presents the office today for follow-up evaluation of right foot pain.  She says the swelling has improved and the pain has improved some but she still not able to wear her work boot.  There is one area of tenderness that she can point to submetatarsal areas where she is majority of tenderness this is what limits her from wearing a regular shoe.  Medications are prescribed to her last appointment did not help. Denies any systemic complaints such as fevers, chills, nausea, vomiting. No acute changes since last appointment, and no other complaints at this time.   Objective: AAO x3, NAD DP/PT pulses palpable bilaterally, CRT less than 3 seconds There is decrease edema to the right foot however edema still continues but there is no erythema or warmth.  There is no fluctuation or crepitation there is no open lesions.  There is no specific area pinpoint tenderness.  There is one area of tenderness most of the second interspace plantarly which she is majority of tenderness but there is no open sores or any warmth or any signs of infection of this area.  Overall the color appears to be about equal compared to contralateral extremity. No open lesions or pre-ulcerative lesions.  No pain with calf compression, swelling, warmth, erythema  Assessment: Right foot pain, capsulitis/tendonitis  Plan: -All treatment options discussed with the patient including all alternatives, risks, complications.  -Today we did 1 steroid injection specifically on the area of tenderness in the second interspace over the area of maximal tenderness.  This was performed today after the areas cleaned with alcohol and a mixture of 0.5 cc of Kenalog 10, 0.5 cc of dexamethasone phosphate 0.5 cc of Marcaine plain was infiltrated without any complications.  Post injection care was discussed. -I want her to bring her work boots into the office we can evaluate them and see if we can help accommodate them so she  can get back to work.  Continue ice elevation.  Continue compression anklet which she has not been wearing. -She has not been able to start PT. I encouraged her to make an appointment today -Patient encouraged to call the office with any questions, concerns, change in symptoms.   Trula Slade DPM  *due to her financial hardship I did not charge her for [facility] paperwork.

## 2018-05-01 ENCOUNTER — Ambulatory Visit (INDEPENDENT_AMBULATORY_CARE_PROVIDER_SITE_OTHER): Payer: Medicare Other

## 2018-05-01 ENCOUNTER — Encounter: Payer: Self-pay | Admitting: Podiatry

## 2018-05-01 ENCOUNTER — Ambulatory Visit (INDEPENDENT_AMBULATORY_CARE_PROVIDER_SITE_OTHER): Payer: Medicare Other | Admitting: Podiatry

## 2018-05-01 DIAGNOSIS — M779 Enthesopathy, unspecified: Secondary | ICD-10-CM

## 2018-05-01 DIAGNOSIS — G9050 Complex regional pain syndrome I, unspecified: Secondary | ICD-10-CM | POA: Diagnosis not present

## 2018-05-07 NOTE — Progress Notes (Signed)
Subjective: Rebecca Osborne presents the office today with her daughter for follow-up evaluation of right foot pain, swelling.  Overall is been getting better but slowly.  She states there is just some area on the bottom of her foot on the ball that is tender when she puts pressure to the area.  She has not yet started physical therapy.  The injection did help with the soreness away.  She denies any recent injury or trauma since I last saw her.  The swelling is been intermittent.  She denies any open sores.  No redness or warmth. Denies any systemic complaints such as fevers, chills, nausea, vomiting. No acute changes since last appointment, and no other complaints at this time.   Objective: AAO x3, NAD DP/PT pulses palpable bilaterally, CRT less than 3 seconds There is still some swelling present submetatarsal area in the right foot but overall swelling is improved.  There is no area pinpoint tenderness.  Some interspace tenderness present on second interspace.  Overall she does appear to be improved however still experiencing some discomfort.  She has not yet tried her work shoes on.  No open lesions or pre-ulcerative lesions.  No pain with calf compression, swelling, warmth, erythema  Assessment: 67 year old female with resolving right chronic foot pain, tendinitis/capsulitis; concern for CRPS  Plan: -All treatment options discussed with the patient including all alternatives, risks, complications.  -This is a very difficult case and had 2 other opinions on this.  She declines any other physicians.  We will hold off on any further steroid injections.  I did give her some Biofreeze today.  Also will continue the compression anklet which is been helping.  Overall swelling is improved.  I want her to bring her work shoes today but she forgot them.  She can bring them by the office I can see if I can help modify them as some cushion in order to get her back into her work shoes.  I also need for their fitting  appropriately.  She declines pain management consult for rule out CRPS.  I do want her start physical therapy. -I encouraged her to follow-up with her primary care physician as well.  There was some concern about her vitamin D level. -Patient encouraged to call the office with any questions, concerns, change in symptoms.   Trula Slade DPM

## 2018-05-15 ENCOUNTER — Encounter: Payer: Self-pay | Admitting: Podiatry

## 2018-05-15 ENCOUNTER — Ambulatory Visit (INDEPENDENT_AMBULATORY_CARE_PROVIDER_SITE_OTHER): Payer: Medicare Other | Admitting: Podiatry

## 2018-05-15 DIAGNOSIS — M779 Enthesopathy, unspecified: Secondary | ICD-10-CM

## 2018-05-15 DIAGNOSIS — R609 Edema, unspecified: Secondary | ICD-10-CM

## 2018-05-16 NOTE — Progress Notes (Signed)
Subjective: 67 year old female presents the office today for follow-up evaluation of right pain and swelling.  She did not bring her work boots with her again today.  She states that the Biofreeze has helped some.  She has not seen her primary care physician and she has not started physical therapy as well.  She is getting better slowly she still has discomfort she cannot return to work she states. Denies any systemic complaints such as fevers, chills, nausea, vomiting. No acute changes since last appointment, and no other complaints at this time.   Objective: AAO x3, NAD DP/PT pulses palpable bilaterally, CRT less than 3 seconds There is still some mild to moderate swelling to the plantar forefoot.  There is mild tenderness submetatarsal 2 area.  There is no erythema or increase in warmth there is no fluctuation or crepitation or any open sores.  No significant pinpoint bony tenderness.  The swelling to the toes is improved. No open lesions or pre-ulcerative lesions.  No pain with calf compression, swelling, warmth, erythema  Assessment: Improving right foot pain and swelling although does continue  Plan: -All treatment options discussed with the patient including all alternatives, risks, complications.  -Unna boot was applied today.  Also I want her to use ibuprofen on a daily basis to help with the swelling and inflammation.  Given her previous history of kidney issues we will do a low-dose ibuprofen over-the-counter.  Kidney function is been stable. -Follow-up with her in about 2 weeks.  The time I encouraged her to bring her work shoes with her to see if I can do anything to help modify them. -Patient encouraged to call the office with any questions, concerns, change in symptoms.   Trula Slade DPM

## 2018-05-22 ENCOUNTER — Encounter: Payer: Self-pay | Admitting: Podiatry

## 2018-05-22 ENCOUNTER — Ambulatory Visit (INDEPENDENT_AMBULATORY_CARE_PROVIDER_SITE_OTHER): Payer: Medicare Other | Admitting: Podiatry

## 2018-05-22 DIAGNOSIS — R609 Edema, unspecified: Secondary | ICD-10-CM

## 2018-05-22 DIAGNOSIS — M779 Enthesopathy, unspecified: Secondary | ICD-10-CM | POA: Diagnosis not present

## 2018-05-28 ENCOUNTER — Ambulatory Visit (INDEPENDENT_AMBULATORY_CARE_PROVIDER_SITE_OTHER): Payer: Medicare Other | Admitting: Podiatry

## 2018-05-28 ENCOUNTER — Encounter: Payer: Self-pay | Admitting: Podiatry

## 2018-05-28 DIAGNOSIS — M779 Enthesopathy, unspecified: Secondary | ICD-10-CM

## 2018-05-28 DIAGNOSIS — G9050 Complex regional pain syndrome I, unspecified: Secondary | ICD-10-CM

## 2018-05-28 DIAGNOSIS — R609 Edema, unspecified: Secondary | ICD-10-CM

## 2018-05-31 NOTE — Progress Notes (Signed)
Subjective: 67 year old female presents the office today for follow-up evaluation of right pain and swelling.  She states that overall she is doing somewhat better.  The foot still hurts on the right side at times.  She is to get some swelling as well but denies any redness or warmth. Denies any systemic complaints such as fevers, chills, nausea, vomiting. No acute changes since last appointment, and no other complaints at this time.   Objective: AAO x3, NAD DP/PT pulses palpable bilaterally, CRT less than 3 seconds There is still some mild to moderate swelling to the plantar forefoot however clinically does appear to be improving.  There is still some tenderness along submetatarsal 2 area in the right foot but there is no open sores there is no area of fluctuation crepitation.  There is no ulceration.  No swelling to the ankle or redness of the ankle. No open lesions or pre-ulcerative lesions.  No pain with calf compression, swelling, warmth, erythema  Assessment:  Plan: -All treatment options discussed with the patient including all alternatives, risks, complications.  -Overall the Unna boot is been helpful and the swelling has improved.  Her tenderness is also previously improved.  We did apply new Unna boot today and advised on precautions on to remove it becomes too tight or she has any discoloration.  Continue with ibuprofen daily as is been helpful as well.  I did add a metatarsal pad to her work shoe. -Patient encouraged to call the office with any questions, concerns, change in symptoms.   Trula Slade DPM

## 2018-06-07 ENCOUNTER — Ambulatory Visit (INDEPENDENT_AMBULATORY_CARE_PROVIDER_SITE_OTHER): Payer: Medicare Other | Admitting: Podiatry

## 2018-06-07 DIAGNOSIS — R609 Edema, unspecified: Secondary | ICD-10-CM | POA: Diagnosis not present

## 2018-06-07 DIAGNOSIS — I872 Venous insufficiency (chronic) (peripheral): Secondary | ICD-10-CM

## 2018-06-07 DIAGNOSIS — R0989 Other specified symptoms and signs involving the circulatory and respiratory systems: Secondary | ICD-10-CM | POA: Diagnosis not present

## 2018-06-07 DIAGNOSIS — I8391 Asymptomatic varicose veins of right lower extremity: Secondary | ICD-10-CM

## 2018-06-07 DIAGNOSIS — M779 Enthesopathy, unspecified: Secondary | ICD-10-CM | POA: Diagnosis not present

## 2018-06-07 NOTE — Progress Notes (Signed)
Subjective: 68 year old female presents the office today for follow-up evaluation of right pain and swelling.  Overall she states that the area still swollen and tender.  She has not been able to go back into her work shoes.  She still out of work.  She states that here that she was having pain submetatarsal 2 area that she points to is no longer having pain she is getting more pain more to the lateral aspect she points more submetatarsal 3-5.  Area still swells.  She has not started physical therapy. denies any redness or any red streaks or any open sores.Denies any systemic complaints such as fevers, chills, nausea, vomiting. No acute changes since last appointment, and no other complaints at this time.   Objective: AAO x3, NAD DP/PT pulses palpable bilaterally, CRT less than 3 seconds There is still some mild to moderate swelling to the plantar forefoot.  The pain seems to have moved more to the third interspace today.  There is no pain submetatarsal 2 of the second space where she was having pain previously.  Some discomfort submetatarsal 5 area as well I think this is likely more from compensation.  There is no erythema, ascending cellulitis there is no areas of fluctuation or crepitation or any open lesions identified today.  No open lesions or pre-ulcerative lesions.  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  -At this point we have attempted numerous treatments without any significant resolution.  Although her symptoms are improving she still not able to return to work.  Today the pain has moved to the third interspace and a steroid injection was performed to this area.  See procedure note below.  I have attempted numerous options without any significant resolution of also have her evaluated by Dr. Paulla Dolly  and Amalia Hailey while she was here. I am going to have her follow-up with Dr. March Rummage for another opinion. -Recommend to start physical therapy.  Procedure: Injection Tendon/Ligament Discussed alternatives, risks, complications and verbal consent was obtained.  Location: Right plantar fascia at the glabrous junction; medial approach. Skin Prep: Alcohol. Injectate: 0.5cc 0.5% marcaine plain, 0.5 cc 2% lidocaine plain and, 1 cc kenalog 10. Disposition: Patient tolerated procedure well. Injection site dressed with a band-aid.  Post-injection care was discussed and return precautions discussed.    Trula Slade DPM

## 2018-06-07 NOTE — Progress Notes (Signed)
Subjective:  Patient ID: Rebecca Osborne, female    DOB: 1951/05/18,  MRN: [id number]  Chief Complaint  Patient presents with  . Leg Swelling    right foot and leg -second opinion, [facility] patient    68 y.o. female presents with the above complaint.  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  Otherwise she continues to have pain and swelling of the right foot worse than the left.   Review of Systems: Negative except as noted in the HPI. Denies N/V/F/Ch.  Past Medical History:  Diagnosis Date  . Allergy   . Cancer (HCC)    cervical cancer cells  . GERD (gastroesophageal reflux disease)   . Osteopenia     Current Outpatient Medications:  .  acetaminophen (TYLENOL) 500 MG tablet, Take 1 tablet (500 mg total) by mouth every 6 (six) hours as needed., Disp: 30 tablet, Rfl: 0 .  ALPRAZolam (XANAX) 0.25 MG tablet, Take 1 tablet (0.25 mg total) by mouth 2 (two) times daily as needed for anxiety., Disp: 20 tablet, Rfl: 0 .  amoxicillin-clavulanate (AUGMENTIN) 875-125 MG tablet, Take 1 tablet by mouth 2 (two) times daily., Disp: 20 tablet, Rfl: 0 .  Calcium Citrate-Vitamin D (CALCIUM + D PO), Take 2 tablets by mouth daily., Disp: , Rfl:  .  cephALEXin (KEFLEX) 500 MG capsule, Take 1 capsule (500 mg total) by mouth 2 (two) times daily., Disp: 30 capsule, Rfl: 0 .  gabapentin (NEURONTIN) 100 MG capsule, Take 1 capsule (100 mg total) by mouth 3 (three) times daily., Disp: 90 capsule, Rfl: 0 .  ibuprofen (ADVIL,MOTRIN) 400 MG tablet, Take 1 tablet (400 mg total) by mouth every 6 (six) hours as needed., Disp: 30 tablet, Rfl: 0 .  meloxicam (MOBIC) 7.5 MG tablet, Take 2 tablets (15 mg total) by mouth daily., Disp: 30 tablet, Rfl: 0 .  methylPREDNISolone (MEDROL) 4 MG TBPK tablet, Take as a 6 day  tapering dose, Disp: 21 tablet, Rfl: 0  Social History   Tobacco Use  Smoking Status Never Smoker  Smokeless Tobacco Never Used    Allergies  Allergen Reactions  . Codeine Itching   Objective:  There were no vitals filed for this visit. There is no height or weight on file to calculate BMI. Constitutional Well developed. Well nourished.  Vascular Dorsalis pedis pulses palpable bilaterally. Posterior tibial pulses palpable bilaterally. Capillary refill normal to all digits.  No cyanosis or clubbing noted. Pedal hair growth normal. Varicosities present with spider veins and telangiectasias bilateral Edema right foot.  Right foot warm to touch.  Neurologic Normal speech. Oriented to person, place, and time. Epicritic sensation to light touch grossly present bilaterally.  Dermatologic Nails well groomed and normal in appearance. No open wounds. No skin lesions.  Orthopedic: Normal joint ROM without pain or crepitus bilaterally. No visible deformities. Pedal patient about the right third interspace, plantar right third interspace, dorsal foot extensor tendons   Radiographs: Prior x-rays reviewed Assessment:   1. Capsulitis   2. Swelling   3. Diminished pulses in lower extremity   4. Venous (peripheral) insufficiency   5. Venous reflux   6. Asymptomatic varicose veins of right lower extremity    Plan:  Patient was evaluated and treated and all questions answered.  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  Will have patient follow-up results with Dr. Para March  No follow-ups on file.

## 2018-06-08 ENCOUNTER — Telehealth: Payer: Self-pay | Admitting: *Deleted

## 2018-06-08 DIAGNOSIS — M25471 Effusion, right ankle: Secondary | ICD-10-CM | POA: Diagnosis not present

## 2018-06-08 DIAGNOSIS — M25474 Effusion, right foot: Secondary | ICD-10-CM | POA: Diagnosis not present

## 2018-06-08 DIAGNOSIS — M25571 Pain in right ankle and joints of right foot: Secondary | ICD-10-CM | POA: Diagnosis not present

## 2018-06-08 DIAGNOSIS — M25674 Stiffness of right foot, not elsewhere classified: Secondary | ICD-10-CM | POA: Diagnosis not present

## 2018-06-08 NOTE — Telephone Encounter (Signed)
Rebecca Osborne - [facility] states received orders for Arterial doppler as Stat order, but ABI with TBI must be performed prior to Arterials and 1st available is 06/12/2018, if the Arterials are to be Stat pt will need to be scheduled with a different office. Rebecca Osborne - [facility] states she will contact pt with the 06/12/2018 appt, if not contacted by our office. Faxed note stating pt is to be scheduled with [facility] on 06/12/2018.

## 2018-06-11 ENCOUNTER — Telehealth (HOSPITAL_COMMUNITY): Payer: Self-pay | Admitting: *Deleted

## 2018-06-11 DIAGNOSIS — M25474 Effusion, right foot: Secondary | ICD-10-CM | POA: Diagnosis not present

## 2018-06-11 DIAGNOSIS — M25471 Effusion, right ankle: Secondary | ICD-10-CM | POA: Diagnosis not present

## 2018-06-11 DIAGNOSIS — M25674 Stiffness of right foot, not elsewhere classified: Secondary | ICD-10-CM | POA: Diagnosis not present

## 2018-06-11 DIAGNOSIS — M25571 Pain in right ankle and joints of right foot: Secondary | ICD-10-CM | POA: Diagnosis not present

## 2018-06-11 NOTE — Telephone Encounter (Signed)
06/11/2018 spoke w/pt to confirm appts.  Patient unsure if she can make appoints due to foot injury, will call if she needs to cancel appts.

## 2018-06-12 ENCOUNTER — Ambulatory Visit (HOSPITAL_COMMUNITY): Admission: RE | Admit: 2018-06-12 | Payer: Medicare Other | Source: Ambulatory Visit

## 2018-06-12 ENCOUNTER — Ambulatory Visit (HOSPITAL_COMMUNITY)
Admission: RE | Admit: 2018-06-12 | Discharge: 2018-06-12 | Disposition: A | Discharge status: Home / Self Care | Payer: Medicare Other | Source: Ambulatory Visit | Attending: Podiatry | Admitting: Podiatry

## 2018-06-12 DIAGNOSIS — R609 Edema, unspecified: Secondary | ICD-10-CM

## 2018-06-12 DIAGNOSIS — R0989 Other specified symptoms and signs involving the circulatory and respiratory systems: Secondary | ICD-10-CM | POA: Insufficient documentation

## 2018-06-13 ENCOUNTER — Ambulatory Visit (HOSPITAL_COMMUNITY)
Admission: RE | Admit: 2018-06-13 | Discharge: 2018-06-13 | Disposition: A | Discharge status: Home / Self Care | Payer: Medicare Other | Source: Ambulatory Visit | Attending: Family Medicine | Admitting: Family Medicine

## 2018-06-13 ENCOUNTER — Encounter: Payer: Self-pay | Admitting: Vascular Surgery

## 2018-06-13 DIAGNOSIS — R0989 Other specified symptoms and signs involving the circulatory and respiratory systems: Secondary | ICD-10-CM | POA: Diagnosis not present

## 2018-06-13 DIAGNOSIS — R609 Edema, unspecified: Secondary | ICD-10-CM | POA: Diagnosis not present

## 2018-06-15 ENCOUNTER — Other Ambulatory Visit: Payer: Self-pay

## 2018-06-15 ENCOUNTER — Telehealth: Payer: Self-pay | Admitting: Podiatry

## 2018-06-15 ENCOUNTER — Encounter (HOSPITAL_COMMUNITY): Payer: Self-pay | Admitting: Emergency Medicine

## 2018-06-15 ENCOUNTER — Emergency Department (HOSPITAL_COMMUNITY)
Admission: EM | Admit: 2018-06-15 | Discharge: 2018-06-15 | Disposition: A | Discharge status: Home / Self Care | Payer: Medicare Other | Attending: Emergency Medicine | Admitting: Emergency Medicine

## 2018-06-15 ENCOUNTER — Emergency Department (HOSPITAL_COMMUNITY): Payer: Medicare Other

## 2018-06-15 DIAGNOSIS — R0789 Other chest pain: Secondary | ICD-10-CM | POA: Insufficient documentation

## 2018-06-15 DIAGNOSIS — Z79899 Other long term (current) drug therapy: Secondary | ICD-10-CM | POA: Diagnosis not present

## 2018-06-15 DIAGNOSIS — Z8541 Personal history of malignant neoplasm of cervix uteri: Secondary | ICD-10-CM | POA: Diagnosis not present

## 2018-06-15 DIAGNOSIS — M79671 Pain in right foot: Secondary | ICD-10-CM

## 2018-06-15 DIAGNOSIS — Z9049 Acquired absence of other specified parts of digestive tract: Secondary | ICD-10-CM | POA: Diagnosis not present

## 2018-06-15 DIAGNOSIS — R079 Chest pain, unspecified: Secondary | ICD-10-CM | POA: Diagnosis not present

## 2018-06-15 LAB — CBC WITH DIFFERENTIAL/PLATELET
Abs Immature Granulocytes: 0.02 10*3/uL (ref 0.00–0.07)
Basophils Absolute: 0 10*3/uL (ref 0.0–0.1)
Basophils Relative: 0 %
Eosinophils Absolute: 0.1 10*3/uL (ref 0.0–0.5)
Eosinophils Relative: 1 %
HCT: 37.9 % (ref 36.0–46.0)
Hemoglobin: 11.8 g/dL — ABNORMAL LOW (ref 12.0–15.0)
Immature Granulocytes: 0 %
Lymphocytes Relative: 25 %
Lymphs Abs: 1.9 10*3/uL (ref 0.7–4.0)
MCH: 28.9 pg (ref 26.0–34.0)
MCHC: 31.1 g/dL (ref 30.0–36.0)
MCV: 92.9 fL (ref 80.0–100.0)
Monocytes Absolute: 0.4 10*3/uL (ref 0.1–1.0)
Monocytes Relative: 5 %
NEUTROS PCT: 69 %
Neutro Abs: 5.1 10*3/uL (ref 1.7–7.7)
Platelets: 342 10*3/uL (ref 150–400)
RBC: 4.08 MIL/uL (ref 3.87–5.11)
RDW: 12.1 % (ref 11.5–15.5)
WBC: 7.5 10*3/uL (ref 4.0–10.5)
nRBC: 0 % (ref 0.0–0.2)

## 2018-06-15 LAB — BASIC METABOLIC PANEL
Anion gap: 9 (ref 5–15)
BUN: 16 mg/dL (ref 8–23)
CALCIUM: 9.4 mg/dL (ref 8.9–10.3)
CO2: 25 mmol/L (ref 22–32)
Chloride: 104 mmol/L (ref 98–111)
Creatinine, Ser: 0.71 mg/dL (ref 0.44–1.00)
GFR calc Af Amer: >60 mL/min (ref >60)
GFR calc non Af Amer: >60 mL/min (ref >60)
Glucose, Bld: 94 mg/dL (ref 70–99)
Potassium: 3.5 mmol/L (ref 3.5–5.1)
SODIUM: 138 mmol/L (ref 135–145)

## 2018-06-15 NOTE — ED Provider Notes (Signed)
[facility] EMERGENCY DEPARTMENT Provider Note   CSN: [id number] Arrival date & time: 06/15/18  1512     History   Chief Complaint Chief Complaint  Patient presents with  . Abdominal Swelling    HPI Rebecca Osborne is a 67 y.o. female.  HPI Patient presents with concern of foot pain and left lower chest wall mass. Patient has history of fracture of the right foot sustained about 6 weeks ago, notes that she was concern of ongoing swelling, pain in the foot, ankle. She has seen a physician, had ultrasound performed 2 days ago, as yet no results are available. She denies any loss of sensation in the foot itself, states the pain is sore, largely on the plantar surface. However, her larger concern is appreciation of a new masslike lesion in her left lower chest wall, upper abdomen. She only noticed this today, and there is no new associated symptoms include nausea, vomiting, fever, chills, dyspnea. No medication taken for relief of any of these areas of discomfort. Past Medical History:  Diagnosis Date  . Allergy   . Cancer (HCC)    cervical cancer cells  . GERD (gastroesophageal reflux disease)   . Osteopenia     Patient Active Problem List   Diagnosis Date Noted  . Small bowel obstruction due to adhesions ([place]) 06/22/2016  . GERD 08/31/2009  . DYSPHAGIA UNSPECIFIED 08/31/2009    Past Surgical History:  Procedure Laterality Date  . ABDOMINAL HYSTERECTOMY    . BOWEL RESECTION N/A 06/22/2016   Procedure: SMALL BOWEL RESECTION;  Surgeon: Vickie Epley, MD;  Location: [facility];  Service: General;  Laterality: N/A;  . CHOLECYSTECTOMY  2005  . FRACTURE SURGERY     rt leg  . LAPAROTOMY N/A 06/22/2016   Procedure: EXPLORATORY LAPAROTOMY;  Surgeon: Vickie Epley, MD;  Location: [facility];  Service: General;  Laterality: N/A;  . LYSIS OF ADHESION N/A 06/22/2016   Procedure: LYSIS OF ADHESION;  Surgeon: Vickie Epley, MD;  Location: [facility];  Service: General;  Laterality: N/A;       OB History    Gravida      Para      Term      Preterm      AB      Living  3     SAB      TAB      Ectopic      Multiple      Live Births               Home Medications    Prior to Admission medications   Medication Sig Start Date End Date Taking? Authorizing Provider  acetaminophen (TYLENOL) 500 MG tablet Take 1 tablet (500 mg total) by mouth every 6 (six) hours as needed. 02/20/18   Fawze, Mina A, PA-C  ALPRAZolam (XANAX) 0.25 MG tablet Take 1 tablet (0.25 mg total) by mouth 2 (two) times daily as needed for anxiety. 04/16/18   Trula Slade, DPM  amoxicillin-clavulanate (AUGMENTIN) 875-125 MG tablet Take 1 tablet by mouth 2 (two) times daily. 03/20/18   Trula Slade, DPM  Calcium Citrate-Vitamin D (CALCIUM + D PO) Take 2 tablets by mouth daily.    [provider]  cephALEXin (KEFLEX) 500 MG capsule Take 1 capsule (500 mg total) by mouth 2 (two) times daily. 02/26/18   Trula Slade, DPM  gabapentin (NEURONTIN) 100 MG capsule Take 1 capsule (100 mg total) by mouth 3 (three) times  daily. 04/16/18   Trula Slade, DPM  ibuprofen (ADVIL,MOTRIN) 400 MG tablet Take 1 tablet (400 mg total) by mouth every 6 (six) hours as needed. 02/20/18   Fawze, Mina A, PA-C  meloxicam (MOBIC) 7.5 MG tablet Take 2 tablets (15 mg total) by mouth daily. 02/26/18   Trula Slade, DPM  methylPREDNISolone (MEDROL) 4 MG TBPK tablet Take as a 6 day tapering dose 03/06/18   Trula Slade, DPM    Family History Family History  Problem Relation Age of Onset  . COPD Mother   . Heart disease Mother   . Miscarriages / [place] Mother   . Vision loss Mother        from shingles  . COPD Father   . Cancer Brother        leukemia  . Early death Brother     Social History Social History   Tobacco Use  . Smoking status: Never Smoker  . Smokeless tobacco: Never Used  Substance Use Topics  . Alcohol use: No  . Drug use: No     Allergies    Codeine   Review of Systems Review of Systems  Constitutional:       Per HPI, otherwise negative  HENT:       Per HPI, otherwise negative  Respiratory:       Per HPI, otherwise negative  Cardiovascular:       Per HPI, otherwise negative  Gastrointestinal: Negative for vomiting.  Endocrine:       Negative aside from HPI  Genitourinary:       Neg aside from HPI   Musculoskeletal:       Per HPI, otherwise negative  Skin: Negative.   Neurological: Negative for syncope.     Physical Exam Updated Vital Signs BP (!) 118/43   Pulse 72   Temp 97.7 F (36.5 C) (Oral)   Resp 16   Ht 5' 1"  (1.549 m)   Wt 56.7 kg   SpO2 95%   BMI 23.62 kg/m   Physical Exam Vitals signs and nursing note reviewed.  Constitutional:      General: She is not in acute distress.    Appearance: She is well-developed.  HENT:     Head: Normocephalic and atraumatic.  Eyes:     Conjunctiva/sclera: Conjunctivae normal.  Cardiovascular:     Rate and Rhythm: Normal rate and regular rhythm.  Pulmonary:     Effort: Pulmonary effort is normal. No respiratory distress.     Breath sounds: Normal breath sounds. No stridor.  Abdominal:     General: There is no distension.    Musculoskeletal:     Comments: After removal of the right foot postoperative shoe, foot itself is unremarkable, with appreciable pulses dorsalis pedis, no gross deformity, some mild swelling throughout the distal foot. No change in range of motion, mild tenderness palpation, primarily in the distal medial foot.  Skin:    General: Skin is warm and dry.  Neurological:     Mental Status: She is alert and oriented to person, place, and time.     Cranial Nerves: No cranial nerve deficit.      ED Treatments / Results  Labs (all labs ordered are listed, but only abnormal results are displayed) Labs Reviewed  CBC WITH DIFFERENTIAL/PLATELET - Abnormal; Notable for the following components:      Result Value   Hemoglobin 11.8 (*)     All other components within normal limits  BASIC METABOLIC PANEL  Radiology Dg Chest 2 View  Result Date: 06/15/2018 CLINICAL DATA:  Left lower chest wall pain with hard knot. EXAM: CHEST - 2 VIEW COMPARISON:  02/09/2018 FINDINGS: Normal heart size and pulmonary vascularity. No focal airspace disease or consolidation in the lungs. No blunting of costophrenic angles. No pneumothorax. Mediastinal contours appear intact. Calcification of the aorta. No specific abnormality demonstrated in the left lower chest to account for palpable changes. IMPRESSION: No active cardiopulmonary disease. Electronically Signed   By: Lucienne Capers M.D.   On: 06/15/2018 19:11   Dg Foot Complete Right  Result Date: 06/15/2018 CLINICAL DATA:  Right foot pain, EXAM: RIGHT FOOT COMPLETE - 3+ VIEW COMPARISON:  05/01/2018 FINDINGS: Calcaneal spurs are again seen as are tarsal degenerative changes. No acute fracture or dislocation is noted. No gross soft tissue abnormality is noted. IMPRESSION: No acute abnormality noted. Electronically Signed   By: Inez Catalina M.D.   On: 06/15/2018 16:19    Procedures Procedures (including critical care time)  Medications Ordered in ED Medications - No data to display   Initial Impression / Assessment and Plan / ED Course  I have reviewed the triage vital signs and the nursing notes.  Pertinent labs & imaging results that were available during my care of the patient were reviewed by me and considered in my medical decision making (see chart for details).     7:23 PM On repeat exam patient is in no distress. This elderly female presents with concern of a subjectively appreciable lesion on her left lower chest wall. Here she is awake, alert, afebrile, with no evidence for distress, no x-ray evidence for mass, nor pneumonia. Patient has secondary concern of foot pain, though this is seemingly appropriate for recent fracture Patient did have ultrasound performed, is awaiting  results, but with no evidence for DVT clinically, no hemodynamic instability, empiric therapy will not be started, and the patient was instructed to be sure to follow up with her results. Given reassuring findings, patient discharged in stable condition.  Final Clinical Impressions(s) / ED Diagnoses   Final diagnoses:  Atypical chest pain  Right foot pain     Carmin Muskrat, MD 06/15/18 1925

## 2018-06-15 NOTE — ED Notes (Signed)
To rad

## 2018-06-15 NOTE — Discharge Instructions (Addendum)
As discussed, your evaluation today has been largely reassuring.  But, it is important that you monitor your condition carefully, and do not hesitate to return to the ED if you develop new, or concerning changes in your condition. ? ?Otherwise, please follow-up with your physician for appropriate ongoing care. ? ?

## 2018-06-15 NOTE — ED Triage Notes (Addendum)
Patient complaining of "hard knot" on upper left abdomen. States she just noticed it today and denies pain. Also complaining of pain and swelling to right 2nd toe. States she broke this toe in September.

## 2018-06-15 NOTE — Telephone Encounter (Signed)
"  IM CALLING ABOUT MY RESULTS FROM THE VEIN SPECIALIST".

## 2018-06-15 NOTE — ED Notes (Signed)
Pt in NAD, appears to be eating food from her purse

## 2018-06-15 NOTE — ED Notes (Signed)
Pt reports that she felt a knot to her LUQ today  "have been a little constipated" but reports BM for the last 3 days  Also reports R foot pain

## 2018-06-18 ENCOUNTER — Telehealth: Payer: Self-pay | Admitting: Podiatry

## 2018-06-18 NOTE — Telephone Encounter (Signed)
Pt called back wanting to know results for vascular and vein testing done. Please leave the patient a voicemail if unable to reach.

## 2018-06-21 ENCOUNTER — Ambulatory Visit (INDEPENDENT_AMBULATORY_CARE_PROVIDER_SITE_OTHER): Payer: Medicare Other | Admitting: Podiatry

## 2018-06-21 DIAGNOSIS — I8391 Asymptomatic varicose veins of right lower extremity: Secondary | ICD-10-CM | POA: Diagnosis not present

## 2018-06-21 DIAGNOSIS — I872 Venous insufficiency (chronic) (peripheral): Secondary | ICD-10-CM

## 2018-06-21 NOTE — Progress Notes (Signed)
  Subjective:  Patient ID: Rebecca Osborne, female    DOB: May 06, 1951,  MRN: [id number]  Chief Complaint  Patient presents with  . Foot Pain    right foot swelling 2 week follow up    68 y.o. female presents with the above complaint.  Had vascular studies performed here for review states that she cannot continue to live with the pain that she is having her right foot  Review of Systems: Negative except as noted in the HPI. Denies N/V/F/Ch.  Past Medical History:  Diagnosis Date  . Allergy   . Cancer (HCC)    cervical cancer cells  . GERD (gastroesophageal reflux disease)   . Osteopenia     Current Outpatient Medications:  .  acetaminophen (TYLENOL) 500 MG tablet, Take 1 tablet (500 mg total) by mouth every 6 (six) hours as needed., Disp: 30 tablet, Rfl: 0 .  ALPRAZolam (XANAX) 0.25 MG tablet, Take 1 tablet (0.25 mg total) by mouth 2 (two) times daily as needed for anxiety., Disp: 20 tablet, Rfl: 0 .  amoxicillin-clavulanate (AUGMENTIN) 875-125 MG tablet, Take 1 tablet by mouth 2 (two) times daily., Disp: 20 tablet, Rfl: 0 .  Calcium Citrate-Vitamin D (CALCIUM + D PO), Take 2 tablets by mouth daily., Disp: , Rfl:  .  cephALEXin (KEFLEX) 500 MG capsule, Take 1 capsule (500 mg total) by mouth 2 (two) times daily., Disp: 30 capsule, Rfl: 0 .  gabapentin (NEURONTIN) 100 MG capsule, Take 1 capsule (100 mg total) by mouth 3 (three) times daily., Disp: 90 capsule, Rfl: 0 .  ibuprofen (ADVIL,MOTRIN) 400 MG tablet, Take 1 tablet (400 mg total) by mouth every 6 (six) hours as needed., Disp: 30 tablet, Rfl: 0 .  meloxicam (MOBIC) 7.5 MG tablet, Take 2 tablets (15 mg total) by mouth daily., Disp: 30 tablet, Rfl: 0 .  methylPREDNISolone (MEDROL) 4 MG TBPK tablet, Take as a 6 day tapering dose, Disp: 21 tablet, Rfl: 0  Social History   Tobacco Use  Smoking Status Never Smoker  Smokeless Tobacco Never Used    Allergies  Allergen Reactions  . Codeine Itching   Objective:  There were no  vitals filed for this visit. There is no height or weight on file to calculate BMI. Constitutional Well developed. Well nourished.  Vascular Dorsalis pedis pulses palpable bilaterally. Posterior tibial pulses palpable bilaterally. Capillary refill normal to all digits.  No cyanosis or clubbing noted. Pedal hair growth normal. Varicosities present with spider veins and telangiectasias bilateral Edema right foot.  Right foot warm to touch.  Neurologic Normal speech. Oriented to person, place, and time. Epicritic sensation to light touch grossly present bilaterally.  Dermatologic Nails well groomed and normal in appearance. No open wounds. No skin lesions.  Orthopedic: Normal joint ROM without pain or crepitus bilaterally. No visible deformities. Pain to palpation about the right third interspace, plantar right third interspace, dorsal foot extensor tendons   Assessment:   1. Venous reflux   2. Venous (peripheral) insufficiency   3. Asymptomatic varicose veins of right lower extremity    Plan:  Patient was evaluated and treated and all questions answered.  Venous insufficiency bilateral with venous reflux. -Venous reflux studies noted abnormal reflux in the femoral vein, greater saphenous vein at the saphenofemoral junction.  Refer to vein and vascular for evaluation for evaluation for venous reflux and possible therapy.  Return in about 4 weeks (around 07/19/2018).

## 2018-06-21 NOTE — Telephone Encounter (Signed)
Discussed with patient today.

## 2018-06-22 NOTE — Addendum Note (Signed)
Addended by: Harriett Sine D on: 06/22/2018 08:32 AM   Modules accepted: Orders

## 2018-06-25 ENCOUNTER — Ambulatory Visit (INDEPENDENT_AMBULATORY_CARE_PROVIDER_SITE_OTHER): Payer: Medicare Other | Admitting: Family Medicine

## 2018-06-25 ENCOUNTER — Encounter: Payer: Self-pay | Admitting: Family Medicine

## 2018-06-25 VITALS — BP 128/68 | HR 78 | Temp 98.0°F | Resp 16 | Ht 59.0 in | Wt 126.0 lb

## 2018-06-25 DIAGNOSIS — R1902 Left upper quadrant abdominal swelling, mass and lump: Secondary | ICD-10-CM

## 2018-06-25 DIAGNOSIS — R1907 Generalized intra-abdominal and pelvic swelling, mass and lump: Secondary | ICD-10-CM

## 2018-06-25 NOTE — Progress Notes (Signed)
Subjective:    Patient ID: Rebecca Osborne, female    DOB: [birth year], 68 y.o.   MRN: [id number]  HPI  Patient was recently seen in the emergency room for a mass in her left upper quadrant.  The mass is just below her left ribs under her left breast.  It is tender to palpation.  Medical history is significant for cervical cancer.  She went to the emergency room where a chest x-ray was obtained with no acute abnormalities and she was referred to me to work the area further.  There is no palpable hernia.  The area that she is palpating is just under the ribs on the left side as diagrammed.  It does not have sharp borders.  It is a spongy fat-like mass similar to a lipoma however it is tender to palpation.  It is visually asymmetric compared to the right side.  Patient just noticed this to a few weeks ago Past Medical History:  Diagnosis Date  . Allergy   . Cancer (HCC)    cervical cancer cells  . GERD (gastroesophageal reflux disease)   . Osteopenia    Past Surgical History:  Procedure Laterality Date  . ABDOMINAL HYSTERECTOMY    . BOWEL RESECTION N/A 06/22/2016   Procedure: SMALL BOWEL RESECTION;  Surgeon: Vickie Epley, MD;  Location: [facility];  Service: General;  Laterality: N/A;  . CHOLECYSTECTOMY  2005  . FRACTURE SURGERY     rt leg  . LAPAROTOMY N/A 06/22/2016   Procedure: EXPLORATORY LAPAROTOMY;  Surgeon: Vickie Epley, MD;  Location: [facility];  Service: General;  Laterality: N/A;  . LYSIS OF ADHESION N/A 06/22/2016   Procedure: LYSIS OF ADHESION;  Surgeon: Vickie Epley, MD;  Location: [facility];  Service: General;  Laterality: N/A;   Current Outpatient Medications on File Prior to Visit  Medication Sig Dispense Refill  . gabapentin (NEURONTIN) 100 MG capsule Take 1 capsule (100 mg total) by mouth 3 (three) times daily. 90 capsule 0   No current facility-administered medications on file prior to visit.    Allergies  Allergen Reactions  . Codeine Itching   Social History    Socioeconomic History  . Marital status: Widowed    Spouse name: Not on file  . Number of children: Not on file  . Years of education: Not on file  . Highest education level: Not on file  Occupational History  . Not on file  Social Needs  . Financial resource strain: Not on file  . Food insecurity:    Worry: Not on file    Inability: Not on file  . Transportation needs:    Medical: Not on file    Non-medical: Not on file  Tobacco Use  . Smoking status: Never Smoker  . Smokeless tobacco: Never Used  Substance and Sexual Activity  . Alcohol use: No  . Drug use: No  . Sexual activity: Not Currently  Lifestyle  . Physical activity:    Days per week: Not on file    Minutes per session: Not on file  . Stress: Not on file  Relationships  . Social connections:    Talks on phone: Not on file    Gets together: Not on file    Attends religious service: Not on file    Active member of club or organization: Not on file    Attends meetings of clubs or organizations: Not on file    Relationship status: Not on file  .  Intimate partner violence:    Fear of current or ex partner: Not on file    Emotionally abused: Not on file    Physically abused: Not on file    Forced sexual activity: Not on file  Other Topics Concern  . Not on file  Social History Narrative  . Not on file      Review of Systems  All other systems reviewed and are negative.      Objective:   Physical Exam  Cardiovascular: Normal rate, regular rhythm and normal heart sounds.  Pulmonary/Chest: Effort normal and breath sounds normal.  Abdominal: Soft. Bowel sounds are normal. She exhibits mass.    Musculoskeletal:        General: No edema.  Vitals reviewed.         Assessment & Plan:  Left upper quadrant abdominal mass - Plan: CT Abdomen Pelvis W Contrast  Generalized intra-abdominal and pelvic swelling, mass and lump - Plan: CT Abdomen Pelvis W Contrast  My best guess is a fatty mass  similar to a lipoma just under and adjacent to the ribs making it more pronounced.  Proceed with a CT scan of the abdomen and pelvis to rule out hernia in this area although there is no hernia on Valsalva.

## 2018-07-06 ENCOUNTER — Ambulatory Visit: Payer: Medicare Other | Admitting: Podiatry

## 2018-07-09 ENCOUNTER — Encounter: Payer: Medicare Other | Admitting: Vascular Surgery

## 2018-07-16 ENCOUNTER — Ambulatory Visit (HOSPITAL_COMMUNITY): Payer: Medicare Other

## 2018-07-23 ENCOUNTER — Encounter: Payer: Self-pay | Admitting: Vascular Surgery

## 2018-07-23 ENCOUNTER — Ambulatory Visit (INDEPENDENT_AMBULATORY_CARE_PROVIDER_SITE_OTHER): Payer: Medicare Other | Admitting: Vascular Surgery

## 2018-07-23 VITALS — BP 127/78 | HR 94 | Temp 97.8°F | Resp 16 | Wt 121.8 lb

## 2018-07-23 DIAGNOSIS — M25571 Pain in right ankle and joints of right foot: Secondary | ICD-10-CM

## 2018-07-23 NOTE — Progress Notes (Signed)
Vascular and Vein Specialist of [facility] [place] office  Patient name: Rebecca Osborne MRN: [id number] DOB: 28-Aug-1950 Sex: female  REASON FOR CONSULT: Valuation of right foot pain and arterial and venous flow  HPI: Rebecca Osborne is a 68 y.o. female, who is here today for evaluation of pain and swelling in her right foot.  She is extremely uncomfortable.  She reports that she had a fracture of the toes of her right foot in September 2019.  She reports that she has had severe pain and has been out of work since that time.  She has had multiple soft foods and continues to have severe pain at rest and with walking.  She does have significant swelling limited to her right foot as well.  She reports that she had an ankle sprain walking in her orthotic boot earlier this week and has had a worsening pain in her foot since that time.  Not have any history of DVT.  No history of peripheral vascular occlusive disease.  Past Medical History:  Diagnosis Date  . Allergy   . Cancer (HCC)    cervical cancer cells  . GERD (gastroesophageal reflux disease)   . Osteopenia     Family History  Problem Relation Age of Onset  . COPD Mother   . Heart disease Mother   . Miscarriages / [place] Mother   . Vision loss Mother        from shingles  . COPD Father   . Cancer Brother        leukemia  . Shakela Donati death Brother     SOCIAL HISTORY: Social History   Socioeconomic History  . Marital status: Widowed    Spouse name: Not on file  . Number of children: Not on file  . Years of education: Not on file  . Highest education level: Not on file  Occupational History  . Not on file  Social Needs  . Financial resource strain: Not on file  . Food insecurity:    Worry: Not on file    Inability: Not on file  . Transportation needs:    Medical: Not on file    Non-medical: Not on file  Tobacco Use  . Smoking status: Never Smoker  . Smokeless tobacco: Never  Used  Substance and Sexual Activity  . Alcohol use: No  . Drug use: No  . Sexual activity: Not Currently  Lifestyle  . Physical activity:    Days per week: Not on file    Minutes per session: Not on file  . Stress: Not on file  Relationships  . Social connections:    Talks on phone: Not on file    Gets together: Not on file    Attends religious service: Not on file    Active member of club or organization: Not on file    Attends meetings of clubs or organizations: Not on file    Relationship status: Not on file  . Intimate partner violence:    Fear of current or ex partner: Not on file    Emotionally abused: Not on file    Physically abused: Not on file    Forced sexual activity: Not on file  Other Topics Concern  . Not on file  Social History Narrative  . Not on file    Allergies  Allergen Reactions  . Codeine Itching    No current outpatient medications on file.   No current facility-administered medications for this visit.  REVIEW OF SYSTEMS:  [X]  denotes positive finding, [ ]  denotes negative finding Cardiac  Comments:  Chest pain or chest pressure:    Shortness of breath upon exertion:    Short of breath when lying flat:    Irregular heart rhythm:        Vascular    Pain in calf, thigh, or hip brought on by ambulation:    Pain in feet at night that wakes you up from your sleep:  x   Blood clot in your veins:    Leg swelling:  x       Pulmonary    Oxygen at home:    Productive cough:     Wheezing:         Neurologic    Sudden weakness in arms or legs:     Sudden numbness in arms or legs:     Sudden onset of difficulty speaking or slurred speech:    Temporary loss of vision in one eye:     Problems with dizziness:         Gastrointestinal    Blood in stool:     Vomited blood:         Genitourinary    Burning when urinating:     Blood in urine:        Psychiatric    Major depression:         Hematologic    Bleeding problems:      Problems with blood clotting too easily:        Skin    Rashes or ulcers:        Constitutional    Fever or chills:      PHYSICAL EXAM: Vitals:   07/23/18 0935  BP: 127/78  Pulse: 94  Resp: 16  Temp: 97.8 F (36.6 C)  TempSrc: Temporal  Weight: 121 lb 12.8 oz (55.2 kg)    GENERAL: The patient is a well-nourished female, in no acute distress. The vital signs are documented above. CARDIOVASCULAR: 2+ dorsalis pedis pulses bilaterally.  Scattered telangiectasia over both lower extremities but no evidence of callosities.  He does have swelling from mid calf and extending into her entire foot with some ruborous changes in her foot the right PULMONARY: There is good air exchange  ABDOMEN: Soft and non-tender  MUSCULOSKELETAL: There are no major deformities or cyanosis. NEUROLOGIC: No focal weakness or paresthesias are detected. SKIN: There are no ulcers or rashes noted. PSYCHIATRIC: The patient has a normal affect.  DATA:  I reviewed her lower extremity noninvasive studies.  On June 12, 2018 she had arterial studies which showed completely normal ankle arm index and toe brachial index bilaterally.  On 06/13/2018 she had lower extremity venous studies with reflux exam.  This showed no evidence of DVT.  She had near normal study bilaterally with mild reflux at the common femoral vein and saphenofemoral junction on the right.  Her saphenous vein was not enlarged course  MEDICAL ISSUES: Pain and persistent swelling in the right foot following fracture in September 2019.  I had a long discussion with the patient.  She was worried about the risk for amputation.  I explained that she has completely normal arterial flow and so this would be non-risk for her.  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  This appears to be still primary issue regarding her foot.  She will continue to follow-up with Dr. Shella Spearing  as scheduled for later this  week.   Larina Earthly, MD FACS Vascular and Vein Specialists of [facility] Tel [phone] Pager [phone]

## 2018-07-26 ENCOUNTER — Ambulatory Visit (INDEPENDENT_AMBULATORY_CARE_PROVIDER_SITE_OTHER): Payer: Medicare Other | Admitting: Podiatry

## 2018-07-26 ENCOUNTER — Ambulatory Visit (INDEPENDENT_AMBULATORY_CARE_PROVIDER_SITE_OTHER): Payer: Medicare Other

## 2018-07-26 ENCOUNTER — Other Ambulatory Visit: Payer: Self-pay | Admitting: Podiatry

## 2018-07-26 DIAGNOSIS — M779 Enthesopathy, unspecified: Secondary | ICD-10-CM | POA: Diagnosis not present

## 2018-07-26 DIAGNOSIS — M79671 Pain in right foot: Secondary | ICD-10-CM

## 2018-07-26 DIAGNOSIS — I872 Venous insufficiency (chronic) (peripheral): Secondary | ICD-10-CM

## 2018-07-26 DIAGNOSIS — M7751 Other enthesopathy of right foot: Secondary | ICD-10-CM

## 2018-07-31 ENCOUNTER — Encounter: Payer: Self-pay | Admitting: Podiatry

## 2018-07-31 ENCOUNTER — Ambulatory Visit (INDEPENDENT_AMBULATORY_CARE_PROVIDER_SITE_OTHER): Payer: Medicare Other | Admitting: Podiatry

## 2018-07-31 ENCOUNTER — Ambulatory Visit (INDEPENDENT_AMBULATORY_CARE_PROVIDER_SITE_OTHER): Payer: Medicare Other

## 2018-07-31 VITALS — BP 107/64 | HR 92 | Temp 98.3°F | Resp 14

## 2018-07-31 DIAGNOSIS — R609 Edema, unspecified: Secondary | ICD-10-CM

## 2018-07-31 DIAGNOSIS — M779 Enthesopathy, unspecified: Secondary | ICD-10-CM

## 2018-07-31 DIAGNOSIS — G629 Polyneuropathy, unspecified: Secondary | ICD-10-CM

## 2018-07-31 DIAGNOSIS — G9050 Complex regional pain syndrome I, unspecified: Secondary | ICD-10-CM | POA: Diagnosis not present

## 2018-07-31 NOTE — Patient Instructions (Signed)
Gabapentin capsules or tablets What is this medicine? GABAPENTIN (GA ba pen tin) is used to control seizures in certain types of epilepsy. It is also used to treat certain types of nerve pain. This medicine may be used for other purposes; ask your health care provider or pharmacist if you have questions. COMMON BRAND NAME(S): Active-PAC with Gabapentin, Gabarone, Neurontin What should I tell my health care provider before I take this medicine? They need to know if you have any of these conditions: -kidney disease -suicidal thoughts, plans, or attempt; a previous suicide attempt by you or a family member -an unusual or allergic reaction to gabapentin, other medicines, foods, dyes, or preservatives -pregnant or trying to get pregnant -breast-feeding How should I use this medicine? Take this medicine by mouth with a glass of water. Follow the directions on the prescription label. You can take it with or without food. If it upsets your stomach, take it with food. Take your medicine at regular intervals. Do not take it more often than directed. Do not stop taking except on your doctor's advice. If you are directed to break the 600 or 800 mg tablets in half as part of your dose, the extra half tablet should be used for the next dose. If you have not used the extra half tablet within 28 days, it should be thrown away. A special MedGuide will be given to you by the pharmacist with each prescription and refill. Be sure to read this information carefully each time. Talk to your pediatrician regarding the use of this medicine in children. While this drug may be prescribed for children as young as 3 years for selected conditions, precautions do apply. Overdosage: If you think you have taken too much of this medicine contact a poison control center or emergency room at once. NOTE: This medicine is only for you. Do not share this medicine with others. What if I miss a dose? If you miss a dose, take it as soon  as you can. If it is almost time for your next dose, take only that dose. Do not take double or extra doses. What may interact with this medicine? Do not take this medicine with any of the following medications: -other gabapentin products This medicine may also interact with the following medications: -alcohol -antacids -antihistamines for allergy, cough and cold -certain medicines for anxiety or sleep -certain medicines for depression or psychotic disturbances -homatropine; hydrocodone -naproxen -narcotic medicines (opiates) for pain -phenothiazines like chlorpromazine, mesoridazine, prochlorperazine, thioridazine This list may not describe all possible interactions. Give your health care provider a list of all the medicines, herbs, non-prescription drugs, or dietary supplements you use. Also tell them if you smoke, drink alcohol, or use illegal drugs. Some items may interact with your medicine. What should I watch for while using this medicine? Visit your doctor or health care professional for regular checks on your progress. You may want to keep a record at home of how you feel your condition is responding to treatment. You may want to share this information with your doctor or health care professional at each visit. You should contact your doctor or health care professional if your seizures get worse or if you have any new types of seizures. Do not stop taking this medicine or any of your seizure medicines unless instructed by your doctor or health care professional. Stopping your medicine suddenly can increase your seizures or their severity. Wear a medical identification bracelet or chain if you are taking this medicine for  seizures, and carry a card that lists all your medications. You may get drowsy, dizzy, or have blurred vision. Do not drive, use machinery, or do anything that needs mental alertness until you know how this medicine affects you. To reduce dizzy or fainting spells, do not  sit or stand up quickly, especially if you are an older patient. Alcohol can increase drowsiness and dizziness. Avoid alcoholic drinks. Your mouth may get dry. Chewing sugarless gum or sucking hard candy, and drinking plenty of water will help. The use of this medicine may increase the chance of suicidal thoughts or actions. Pay special attention to how you are responding while on this medicine. Any worsening of mood, or thoughts of suicide or dying should be reported to your health care professional right away. Women who become pregnant while using this medicine may enroll in the [place] Pregnancy Registry by calling [phone]. This registry collects information about the safety of antiepileptic drug use during pregnancy. What side effects may I notice from receiving this medicine? Side effects that you should report to your doctor or health care professional as soon as possible: -allergic reactions like skin rash, itching or hives, swelling of the face, lips, or tongue -worsening of mood, thoughts or actions of suicide or dying Side effects that usually do not require medical attention (report to your doctor or health care professional if they continue or are bothersome): -constipation -difficulty walking or controlling muscle movements -dizziness -nausea -slurred speech -tiredness -tremors -weight gain This list may not describe all possible side effects. Call your doctor for medical advice about side effects. You may report side effects to FDA at 1-800-FDA-1088. Where should I keep my medicine? Keep out of reach of children. This medicine may cause accidental overdose and death if it taken by other adults, children, or pets. Mix any unused medicine with a substance like cat litter or coffee grounds. Then throw the medicine away in a sealed container like a sealed bag or a coffee can with a lid. Do not use the medicine after the expiration date. Store at room  temperature between 15 and 30 degrees C (59 and 86 degrees F). NOTE: This sheet is a summary. It may not cover all possible information. If you have questions about this medicine, talk to your doctor, pharmacist, or health care provider.  2019 Elsevier/Gold Standard (2017-10-26 13:21:44)

## 2018-08-01 ENCOUNTER — Telehealth: Payer: Self-pay | Admitting: *Deleted

## 2018-08-01 DIAGNOSIS — G9050 Complex regional pain syndrome I, unspecified: Secondary | ICD-10-CM

## 2018-08-01 DIAGNOSIS — R609 Edema, unspecified: Secondary | ICD-10-CM

## 2018-08-01 DIAGNOSIS — G629 Polyneuropathy, unspecified: Secondary | ICD-10-CM

## 2018-08-01 DIAGNOSIS — M79671 Pain in right foot: Secondary | ICD-10-CM

## 2018-08-01 DIAGNOSIS — M779 Enthesopathy, unspecified: Secondary | ICD-10-CM

## 2018-08-01 NOTE — Telephone Encounter (Signed)
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 Can you please put in a referral to the [facility] ASAP? Thanks.

## 2018-08-01 NOTE — Progress Notes (Signed)
Subjective: 68 year old female presents the office today for concerns of continued pain and swelling to the right foot.  She is getting frustrated with the continued pain is not able to wear shoes.  Since I saw her she has followed up with Dr. March Rummage and vascular test was performed and she is to follow-up with vascular surgery.  She was inserted that she has adequate circulation.  She has been wearing the unna boot which helped some but she states it hurts to wear this.  She does admit that she has bumped her toes several times and this does hurt when she does.  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  I previously try to treat her for nerve issues which started on gabapentin back in November but she did not want to take this as she only took 4 pills.  She still has this prescription at home. Denies any systemic complaints such as fevers, chills, nausea, vomiting. No acute changes since last appointment, and no other complaints at this time.   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  She is concerned about cancer she is very worked up about this.  Objective: AAO x3, NAD DP/PT pulses palpable bilaterally, CRT less than 3 seconds There is still quite a bit of swelling to the right foot.  There is diffuse tenderness in the foot.  She reports that she feels a cold sensation to her foot but at times it feels warm.  There is no open sores identified.  There is no clinical signs of infection evident.  No pain with ankle.  There is no pain with calf compression, swelling, warmth, erythema today. No open lesions or pre-ulcerative lesions.  No pain with calf compression, swelling, warmth, erythema  Assessment: Continued right foot pain, swelling concern for CRPS  Plan: -All treatment options discussed with the patient including all alternatives, risks,  complications.  -I had seen her previously I was concerned for CRPS as well as neuropathy, nerve issues.  She did not understand this and she did not want take any medication but today upon her evaluation she believes that she has neuropathy.  Although I think it is more of a nerve issue and this is true peripheral neuropathy but I still want to treat her for this.  She has gabapentin at home she can take 1 pill 3 times a day we discussed side effects this medication.  Also modify referral and for neurologist or pain management specialist for evaluation of CRPS.  I previously sent her for physical therapy as well with a headache discontinues because of insurance issues.  We will continue with compression socks as well.  She does not want to wear these and she does not want another Haematologist.  This is been a very complicated case.  I tried to console her and help her understand what is going on.  On the check with physical therapy to see if we can reinstate her. -Patient encouraged to call the office with any questions, concerns, change in symptoms.   Trula Slade DPM  Trula Slade DPM

## 2018-08-01 NOTE — Telephone Encounter (Signed)
Faxed required form, Available clinicals 07/31/2018 - LOV of 05/2018 and demographics to [facility].

## 2018-08-02 ENCOUNTER — Telehealth: Payer: Self-pay | Admitting: *Deleted

## 2018-08-02 NOTE — Telephone Encounter (Signed)
Called patient back and left a message stating per Dr Jacqualyn Posey that patient can take one tablet 3 times a day and can call me back at the [place] office at [phone]. Rebecca Osborne

## 2018-08-02 NOTE — Telephone Encounter (Signed)
Pt returned call still having pain in ankle but is able to walk a little bit. She is still having swelling on top and bottom. She did want to know with the medication she is taking if she could take 2 a day instead of the 1 a day that Dr Jacqualyn Posey had told her to start with. If she does not answer her phone ok to leave a message.

## 2018-08-02 NOTE — Telephone Encounter (Signed)
Called and left a message for the patient today to call me back due to I was checking on the patient from the visit with Dr Jacqualyn Posey yesterday. Lattie Haw

## 2018-08-09 ENCOUNTER — Ambulatory Visit: Payer: Medicare Other | Admitting: Podiatry

## 2018-08-09 ENCOUNTER — Ambulatory Visit (INDEPENDENT_AMBULATORY_CARE_PROVIDER_SITE_OTHER): Payer: Medicare Other | Admitting: Podiatry

## 2018-08-09 ENCOUNTER — Other Ambulatory Visit: Payer: Self-pay | Admitting: Podiatry

## 2018-08-09 ENCOUNTER — Ambulatory Visit (INDEPENDENT_AMBULATORY_CARE_PROVIDER_SITE_OTHER): Payer: Medicare Other

## 2018-08-09 DIAGNOSIS — M659 Synovitis and tenosynovitis, unspecified: Secondary | ICD-10-CM

## 2018-08-09 DIAGNOSIS — S93401A Sprain of unspecified ligament of right ankle, initial encounter: Secondary | ICD-10-CM

## 2018-08-09 DIAGNOSIS — G9050 Complex regional pain syndrome I, unspecified: Secondary | ICD-10-CM

## 2018-08-09 DIAGNOSIS — IMO0001 Reserved for inherently not codable concepts without codable children: Secondary | ICD-10-CM

## 2018-08-09 DIAGNOSIS — M65971 Unspecified synovitis and tenosynovitis, right ankle and foot: Secondary | ICD-10-CM

## 2018-08-09 DIAGNOSIS — M25571 Pain in right ankle and joints of right foot: Secondary | ICD-10-CM

## 2018-08-10 NOTE — Progress Notes (Signed)
Subjective: 68 year old female presents the office today for concerns of ankle pain.  Since I last saw her she is taking gabapentin 3 times a day and she is tolerating this well and she feels that since starting this medication her symptoms are improved however she still having pain and swelling to her ankle and she feels that she may have sprained her ankle.  She has been in the surgical shoe for some time because she cannot wear a regular shoe when she thinks that because of the lack of support to the ankle this is causing some discomfort.  Denies any recent falls.  She has not yet started physical therapy.  I did contact physical therapy and they were going to call her to get scheduled.  There seems to be some miscommunication between physical therapy given her.  She states that she did not want any physical therapy she did not want, although indicates appropriate physical therapy.  They offered to see him for another location.  I also offered to order the referral for another location as well but she did not want this. Denies any systemic complaints such as fevers, chills, nausea, vomiting. No acute changes since last appointment, and no other complaints at this time.   Objective: AAO x3, NAD DP/PT pulses palpable bilaterally, CRT less than 3 seconds There is decrease pain to the forefoot and decrease swelling.  There is swelling localized on the left ankle but there is no erythema or warmth.  There is tenderness present on the medial ankle.  There is no open sores but there is no fluctuation crepitation.  Her symptoms overall have improved since the gabapentin.   No pain with calf compression, swelling, warmth, erythema  Assessment: CRPS right foot; ankle pain  Plan: -All treatment options discussed with the patient including all alternatives, risks, complications.  -Patient is erythema and reviewed the ankle.  There is no definitive evidence of acute fracture.  There is some mild swelling of the  malleolus distally. There is radiolucencies present in the midfoot bones -I was going to place into a cam boot today but on the way out she did not want the cam boot.  I did dispense a Tri-Lock ankle brace with a new surgical shoe at her request.  Hopefully this will give her some stability to the ankle and help with her pain.  However we will long discussion regards the natural progression of CRPS we need to get her moving and start physical therapy.  I am going to again contact physical therapy and I encouraged her to call them as well.  She states a more understanding today after the diagnosis have been trying to explain to her the last couple months. -Awaiting pain management consult -RTC 1-2 weeks or sooner if needed. -Patient encouraged to call the office with any questions, concerns, change in symptoms.   Trula Slade DPM

## 2018-08-11 ENCOUNTER — Telehealth: Payer: Self-pay | Admitting: *Deleted

## 2018-08-11 NOTE — Telephone Encounter (Signed)
Called patient back and stated per Dr Jacqualyn Posey to ice and elevate foot and patient stated that the gabapentin was doing good and the ankle was just hurting due to the sprain and patient has not heard from PT or Pain Management and I stated that I would look into it on Monday and I stated to call the [place] office if any concerns or questions. Lattie Haw

## 2018-08-11 NOTE — Telephone Encounter (Signed)
Spoke with the patient today and the patient stated that the brace is making the ankle hurt and is laying down and the shoe is good and foot is still swelling and I stated that I would ask Dr Jacqualyn Posey about the brace and I would call her back shortly. Rebecca Osborne

## 2018-08-14 ENCOUNTER — Encounter: Payer: Self-pay | Admitting: Physical Medicine & Rehabilitation

## 2018-08-16 ENCOUNTER — Ambulatory Visit: Payer: Medicare Other | Admitting: Podiatry

## 2018-08-17 ENCOUNTER — Other Ambulatory Visit: Payer: Self-pay

## 2018-08-17 ENCOUNTER — Encounter (HOSPITAL_COMMUNITY): Payer: Self-pay

## 2018-08-17 ENCOUNTER — Ambulatory Visit (HOSPITAL_COMMUNITY)
Admission: RE | Admit: 2018-08-17 | Discharge: 2018-08-17 | Disposition: A | Discharge status: Home / Self Care | Payer: Medicare Other | Source: Ambulatory Visit | Attending: Family Medicine | Admitting: Family Medicine

## 2018-08-17 DIAGNOSIS — R1907 Generalized intra-abdominal and pelvic swelling, mass and lump: Secondary | ICD-10-CM | POA: Insufficient documentation

## 2018-08-17 DIAGNOSIS — R109 Unspecified abdominal pain: Secondary | ICD-10-CM | POA: Diagnosis not present

## 2018-08-17 DIAGNOSIS — R1902 Left upper quadrant abdominal swelling, mass and lump: Secondary | ICD-10-CM | POA: Diagnosis not present

## 2018-08-17 DIAGNOSIS — R1900 Intra-abdominal and pelvic swelling, mass and lump, unspecified site: Secondary | ICD-10-CM | POA: Diagnosis not present

## 2018-08-17 LAB — POCT I-STAT CREATININE: Creatinine, Ser: 1.1 mg/dL — ABNORMAL HIGH (ref 0.44–1.00)

## 2018-08-17 MED ORDER — IOHEXOL 300 MG/ML  SOLN
100.0000 mL | Freq: Once | INTRAMUSCULAR | Status: AC | PRN
  Administered 2018-08-17: 100 mL via INTRAVENOUS

## 2018-08-20 DIAGNOSIS — R269 Unspecified abnormalities of gait and mobility: Secondary | ICD-10-CM | POA: Diagnosis not present

## 2018-08-20 DIAGNOSIS — M79674 Pain in right toe(s): Secondary | ICD-10-CM | POA: Diagnosis not present

## 2018-08-20 DIAGNOSIS — M79671 Pain in right foot: Secondary | ICD-10-CM | POA: Diagnosis not present

## 2018-08-20 DIAGNOSIS — M25471 Effusion, right ankle: Secondary | ICD-10-CM | POA: Diagnosis not present

## 2018-08-21 ENCOUNTER — Telehealth: Payer: Self-pay | Admitting: *Deleted

## 2018-08-21 DIAGNOSIS — S93401A Sprain of unspecified ligament of right ankle, initial encounter: Secondary | ICD-10-CM

## 2018-08-21 DIAGNOSIS — M779 Enthesopathy, unspecified: Secondary | ICD-10-CM

## 2018-08-21 DIAGNOSIS — G9050 Complex regional pain syndrome I, unspecified: Secondary | ICD-10-CM

## 2018-08-21 DIAGNOSIS — IMO0001 Reserved for inherently not codable concepts without codable children: Secondary | ICD-10-CM

## 2018-08-21 NOTE — Telephone Encounter (Signed)
[facility] - In-office request orders for PT.

## 2018-08-21 NOTE — Telephone Encounter (Signed)
Referral to [facility] - In-office for 1st degree right ankle sprain given to [facility].

## 2018-08-27 ENCOUNTER — Other Ambulatory Visit: Payer: Self-pay

## 2018-08-27 ENCOUNTER — Ambulatory Visit (INDEPENDENT_AMBULATORY_CARE_PROVIDER_SITE_OTHER): Payer: Medicare Other | Admitting: Podiatry

## 2018-08-27 DIAGNOSIS — R269 Unspecified abnormalities of gait and mobility: Secondary | ICD-10-CM | POA: Diagnosis not present

## 2018-08-27 DIAGNOSIS — G9050 Complex regional pain syndrome I, unspecified: Secondary | ICD-10-CM

## 2018-08-27 DIAGNOSIS — M79674 Pain in right toe(s): Secondary | ICD-10-CM | POA: Diagnosis not present

## 2018-08-27 DIAGNOSIS — M25471 Effusion, right ankle: Secondary | ICD-10-CM | POA: Diagnosis not present

## 2018-08-27 DIAGNOSIS — M79671 Pain in right foot: Secondary | ICD-10-CM | POA: Diagnosis not present

## 2018-08-27 MED ORDER — GABAPENTIN 100 MG PO CAPS: 200.0000 mg | ORAL_CAPSULE | Freq: Three times a day (TID) | ORAL | 0 refills | Status: DC

## 2018-08-27 NOTE — Patient Instructions (Signed)
Gabapentin capsules or tablets What is this medicine? GABAPENTIN (GA ba pen tin) is used to control seizures in certain types of epilepsy. It is also used to treat certain types of nerve pain. This medicine may be used for other purposes; ask your health care provider or pharmacist if you have questions. COMMON BRAND NAME(S): Active-PAC with Gabapentin, Gabarone, Neurontin What should I tell my health care provider before I take this medicine? They need to know if you have any of these conditions: -kidney disease -suicidal thoughts, plans, or attempt; a previous suicide attempt by you or a family member -an unusual or allergic reaction to gabapentin, other medicines, foods, dyes, or preservatives -pregnant or trying to get pregnant -breast-feeding How should I use this medicine? Take this medicine by mouth with a glass of water. Follow the directions on the prescription label. You can take it with or without food. If it upsets your stomach, take it with food. Take your medicine at regular intervals. Do not take it more often than directed. Do not stop taking except on your doctor's advice. If you are directed to break the 600 or 800 mg tablets in half as part of your dose, the extra half tablet should be used for the next dose. If you have not used the extra half tablet within 28 days, it should be thrown away. A special MedGuide will be given to you by the pharmacist with each prescription and refill. Be sure to read this information carefully each time. Talk to your pediatrician regarding the use of this medicine in children. While this drug may be prescribed for children as young as 3 years for selected conditions, precautions do apply. Overdosage: If you think you have taken too much of this medicine contact a poison control center or emergency room at once. NOTE: This medicine is only for you. Do not share this medicine with others. What if I miss a dose? If you miss a dose, take it as soon  as you can. If it is almost time for your next dose, take only that dose. Do not take double or extra doses. What may interact with this medicine? Do not take this medicine with any of the following medications: -other gabapentin products This medicine may also interact with the following medications: -alcohol -antacids -antihistamines for allergy, cough and cold -certain medicines for anxiety or sleep -certain medicines for depression or psychotic disturbances -homatropine; hydrocodone -naproxen -narcotic medicines (opiates) for pain -phenothiazines like chlorpromazine, mesoridazine, prochlorperazine, thioridazine This list may not describe all possible interactions. Give your health care provider a list of all the medicines, herbs, non-prescription drugs, or dietary supplements you use. Also tell them if you smoke, drink alcohol, or use illegal drugs. Some items may interact with your medicine. What should I watch for while using this medicine? Visit your doctor or health care professional for regular checks on your progress. You may want to keep a record at home of how you feel your condition is responding to treatment. You may want to share this information with your doctor or health care professional at each visit. You should contact your doctor or health care professional if your seizures get worse or if you have any new types of seizures. Do not stop taking this medicine or any of your seizure medicines unless instructed by your doctor or health care professional. Stopping your medicine suddenly can increase your seizures or their severity. Wear a medical identification bracelet or chain if you are taking this medicine for  seizures, and carry a card that lists all your medications. You may get drowsy, dizzy, or have blurred vision. Do not drive, use machinery, or do anything that needs mental alertness until you know how this medicine affects you. To reduce dizzy or fainting spells, do not  sit or stand up quickly, especially if you are an older patient. Alcohol can increase drowsiness and dizziness. Avoid alcoholic drinks. Your mouth may get dry. Chewing sugarless gum or sucking hard candy, and drinking plenty of water will help. The use of this medicine may increase the chance of suicidal thoughts or actions. Pay special attention to how you are responding while on this medicine. Any worsening of mood, or thoughts of suicide or dying should be reported to your health care professional right away. Women who become pregnant while using this medicine may enroll in the [place] Pregnancy Registry by calling ([phone]. This registry collects information about the safety of antiepileptic drug use during pregnancy. What side effects may I notice from receiving this medicine? Side effects that you should report to your doctor or health care professional as soon as possible: -allergic reactions like skin rash, itching or hives, swelling of the face, lips, or tongue -worsening of mood, thoughts or actions of suicide or dying Side effects that usually do not require medical attention (report to your doctor or health care professional if they continue or are bothersome): -constipation -difficulty walking or controlling muscle movements -dizziness -nausea -slurred speech -tiredness -tremors -weight gain This list may not describe all possible side effects. Call your doctor for medical advice about side effects. You may report side effects to FDA at 1-800-FDA-1088. Where should I keep my medicine? Keep out of reach of children. This medicine may cause accidental overdose and death if it taken by other adults, children, or pets. Mix any unused medicine with a substance like cat litter or coffee grounds. Then throw the medicine away in a sealed container like a sealed bag or a coffee can with a lid. Do not use the medicine after the expiration date. Store at room  temperature between 15 and 30 degrees C (59 and 86 degrees F). NOTE: This sheet is a summary. It may not cover all possible information. If you have questions about this medicine, talk to your doctor, pharmacist, or health care provider.  2019 Elsevier/Gold Standard (2017-10-26 13:21:44)

## 2018-08-28 ENCOUNTER — Ambulatory Visit: Payer: Medicare Other | Admitting: Family Medicine

## 2018-08-28 NOTE — Progress Notes (Signed)
Subjective: 68 year old female presents the office today for follow-up evaluation of right ankle pain, swelling, CRPS.  She did go to physical therapy today.  She feels the gabapentin is helping but she is asking if we can increase the dose.  She is currently taking 100 mg in the morning, 100 mg at lunch and 200 mg at nighttime.  She is tolerating this medication well.  She wears the ankle brace at times but she feels it is rubbing.  He is also started developed some discomfort of the left ankle from compensation foot but denies any recent injury.  Denies any systemic complaints such as fevers, chills, nausea, vomiting. No acute changes since last appointment, and no other complaints at this time.   Objective: AAO x3, NAD DP/PT pulses palpable bilaterally, CRT less than 3 seconds There is much improved edema to the right foot and ankle.  There is no area pinpoint tenderness today.  There is normal temperature to the foot.  Overall the foot clinically appears to be improving compared to what it has been.  There is no pain to the ankle today although still some swelling to the ankle.  She reports sharp pains going up to the knee.  On the left side she is getting some tenderness on the flexor tendons, posterior tibial tendon.  No edema, erythema. No open lesions or pre-ulcerative lesions.  No pain with calf compression, swelling, warmth, erythema  Assessment: CRPS  Plan: -All treatment options discussed with the patient including all alternatives, risks, complications.  -We are going to increase gabapentin to 200 mg 3 times a day.  Discussed may even go up on that if needed.  I refill this for her today. -Encouraged to continue with physical therapy.  She is only gone to physical therapy couple times.  -She can use the ankle brace on the left side if needed. -RTC 2 weeks or sooner if needed -Patient encouraged to call the office with any questions, concerns, change in symptoms.

## 2018-09-10 ENCOUNTER — Ambulatory Visit: Payer: Medicare Other | Admitting: Physical Medicine & Rehabilitation

## 2018-09-11 ENCOUNTER — Ambulatory Visit (INDEPENDENT_AMBULATORY_CARE_PROVIDER_SITE_OTHER): Payer: Medicare Other | Admitting: Podiatry

## 2018-09-11 ENCOUNTER — Encounter: Payer: Self-pay | Admitting: Podiatry

## 2018-09-11 ENCOUNTER — Other Ambulatory Visit: Payer: Self-pay

## 2018-09-11 VITALS — Temp 97.9°F

## 2018-09-11 DIAGNOSIS — G9050 Complex regional pain syndrome I, unspecified: Secondary | ICD-10-CM | POA: Diagnosis not present

## 2018-09-11 DIAGNOSIS — M79674 Pain in right toe(s): Secondary | ICD-10-CM | POA: Diagnosis not present

## 2018-09-11 DIAGNOSIS — M779 Enthesopathy, unspecified: Secondary | ICD-10-CM

## 2018-09-11 DIAGNOSIS — M79671 Pain in right foot: Secondary | ICD-10-CM | POA: Diagnosis not present

## 2018-09-11 DIAGNOSIS — M25471 Effusion, right ankle: Secondary | ICD-10-CM | POA: Diagnosis not present

## 2018-09-11 DIAGNOSIS — R269 Unspecified abnormalities of gait and mobility: Secondary | ICD-10-CM | POA: Diagnosis not present

## 2018-09-11 MED ORDER — IBUPROFEN 600 MG PO TABS: 600.0000 mg | ORAL_TABLET | Freq: Three times a day (TID) | ORAL | 0 refills | Status: DC | PRN

## 2018-09-12 NOTE — Progress Notes (Addendum)
Subjective: 68 year old female presents the office today for follow-up evaluation of right ankle pain, swelling, CRPS.  She did go to physical therapy today.  She is been taking 200 mg of gabapentin 3 times a day and she has been tolerating this well she does feel it is helping.  She said that she still is swelling and pain of the right foot.  She also states that her left foot is been hurting she points on the instep area as well as the arch where she gets discomfort.  She denies recent injury or falls.  No increase in swelling or redness or discoloration of the skin on the left foot.  She states that in the morning when she first gets up she is having pain to both feet she has almost shuffle when she walks.  She is scheduled for pain management next week.  She is very anxious about this appointment.  Denies any systemic complaints such as fevers, chills, nausea, vomiting. No acute changes since last appointment, and no other complaints at this time.   Objective: AAO x3, NAD DP/PT pulses palpable bilaterally, CRT less than 3 seconds There is still continued edema to the right foot but appears to be mildly improved compared to last appointment.  There is no erythema or increase in warmth Skin temperature is normal compared to contralateral extremity today.  There is no mottling of the skin.  There is diffuse tenderness but not however.  There is no specific area pinpoint bony tenderness. On the left foot there is tenderness on the course the posterior tibial tendon as well as the arch of the foot on the plantar fascia but there is no area of pinpoint tenderness.  Very minimal edema there is normal temperature and skin color present.  MMT 5/5 on the left side decreased on the right side and she is also guarding on the right side. No open lesions or pre-ulcerative lesions.  No pain with calf compression, swelling, warmth, erythema  Assessment: CRPS right side; tendonitis  Plan: -All treatment options  discussed with the patient including all alternatives, risks, complications.  -We had a long discussion today in regards to treatment options.  I have been seeing Ms. Sura for some time now.  This originally started after she was wearing a coworker shoes and they were too tight and she states that the foot was hurting while working but she continued to work wearing the wrong size shoe.  After that is when she developed increasing pain and swelling to the foot immediatly.  After there is no improvement she was referred to see me.  Originally she was treated as an acute injury.  She went on to have a steroid injections of the foot which did help temporarily but not providing long-term relief.  As she was not progressing we discussed CRPS as she had symptoms of this.  We started her on gabapentin and also antianxiety medication but she did not want the medication at that time and she also adamantly did not want any physical therapy.  We did a period of compression and encouraged her to keep moving and stay active as much as possible. I also had other physicians see her in the group.  Her daughter was doing research and discuss peripheral neuropathy.  At this time she was willing to start gabapentin again and physical therapy.  I again discussed with her at length CRPS and the natural progression.  She is states that since starting the gabapentin physical therapy she  has made improvement.  She is willing to be seen by the pain management center next week although very anxious about this. -Today we discussed other options.  Physical therapy is been helpful and I encouraged her to continue with home exercises.  Did apply Unna boot to help with swelling today and encouraged her to keep active as much as possible.  She can remove the [employer] tomorrow. -Continue current gabapentin dose for now.  Discussed adjustment. -For the left foot I try to put an accommodative orthotic inside her shoe to give her some support.   We also discussed stretching, icing exercises for the left foot and encouraged rehab exercises as well.  Again today before she left the office she states that nothing is helping her left foot she is not willing to try treatment unless she gets immediate relief.  This been very difficult with her. Discussed trying to gradually get use to the insert on the left and we discussed changing shoes as well to see if this will help.  -I would like to get further blood work to rule out underlying arthritic process. I tried to have her do this before but she declined. She is willing at this time. I ordered RF, ANA, ESR, CRP. (ESR was slightly elevated before).  -Patient encouraged to call the office with any questions, concerns, change in symptoms.   Trula Slade DPM

## 2018-09-17 ENCOUNTER — Ambulatory Visit: Payer: Medicare Other | Admitting: Physical Medicine & Rehabilitation

## 2018-09-17 DIAGNOSIS — M79674 Pain in right toe(s): Secondary | ICD-10-CM | POA: Diagnosis not present

## 2018-09-17 DIAGNOSIS — M25471 Effusion, right ankle: Secondary | ICD-10-CM | POA: Diagnosis not present

## 2018-09-17 DIAGNOSIS — R269 Unspecified abnormalities of gait and mobility: Secondary | ICD-10-CM | POA: Diagnosis not present

## 2018-09-17 DIAGNOSIS — M79671 Pain in right foot: Secondary | ICD-10-CM | POA: Diagnosis not present

## 2018-09-20 DIAGNOSIS — M25471 Effusion, right ankle: Secondary | ICD-10-CM | POA: Diagnosis not present

## 2018-09-20 DIAGNOSIS — M79671 Pain in right foot: Secondary | ICD-10-CM | POA: Diagnosis not present

## 2018-09-20 DIAGNOSIS — R269 Unspecified abnormalities of gait and mobility: Secondary | ICD-10-CM | POA: Diagnosis not present

## 2018-09-20 DIAGNOSIS — M79674 Pain in right toe(s): Secondary | ICD-10-CM | POA: Diagnosis not present

## 2018-09-24 DIAGNOSIS — M25471 Effusion, right ankle: Secondary | ICD-10-CM | POA: Diagnosis not present

## 2018-09-24 DIAGNOSIS — M79674 Pain in right toe(s): Secondary | ICD-10-CM | POA: Diagnosis not present

## 2018-09-24 DIAGNOSIS — R269 Unspecified abnormalities of gait and mobility: Secondary | ICD-10-CM | POA: Diagnosis not present

## 2018-09-24 DIAGNOSIS — M25671 Stiffness of right ankle, not elsewhere classified: Secondary | ICD-10-CM | POA: Diagnosis not present

## 2018-09-24 DIAGNOSIS — M79671 Pain in right foot: Secondary | ICD-10-CM | POA: Diagnosis not present

## 2018-09-26 ENCOUNTER — Telehealth: Payer: Self-pay | Admitting: Podiatry

## 2018-09-26 MED ORDER — IBUPROFEN 600 MG PO TABS: 600.0000 mg | ORAL_TABLET | Freq: Three times a day (TID) | ORAL | 0 refills | Status: DC | PRN

## 2018-09-26 NOTE — Addendum Note (Signed)
Addended by: Harriett Sine D on: 09/26/2018 02:24 PM   Modules accepted: Orders

## 2018-09-26 NOTE — Telephone Encounter (Signed)
Dr. Berton Lan the refill as previously and pt is to make an appt if continued problems.

## 2018-09-26 NOTE — Telephone Encounter (Signed)
Received request for refill of ibuprofen 668m.

## 2018-09-26 NOTE — Telephone Encounter (Signed)
Pt called requesting a refill on her antibiotic and when asked what the name of the medication was that she wanted refilled she stated that she wanted Ibuprofen 600 mg. Please give patient a call.

## 2018-10-04 ENCOUNTER — Ambulatory Visit (INDEPENDENT_AMBULATORY_CARE_PROVIDER_SITE_OTHER): Payer: Medicare Other | Admitting: Podiatry

## 2018-10-04 ENCOUNTER — Telehealth: Payer: Self-pay | Admitting: *Deleted

## 2018-10-04 ENCOUNTER — Other Ambulatory Visit: Payer: Self-pay

## 2018-10-04 ENCOUNTER — Ambulatory Visit (INDEPENDENT_AMBULATORY_CARE_PROVIDER_SITE_OTHER): Payer: Medicare Other

## 2018-10-04 DIAGNOSIS — M779 Enthesopathy, unspecified: Secondary | ICD-10-CM

## 2018-10-04 DIAGNOSIS — G9050 Complex regional pain syndrome I, unspecified: Secondary | ICD-10-CM

## 2018-10-04 DIAGNOSIS — S93401A Sprain of unspecified ligament of right ankle, initial encounter: Secondary | ICD-10-CM

## 2018-10-04 DIAGNOSIS — R609 Edema, unspecified: Secondary | ICD-10-CM

## 2018-10-04 DIAGNOSIS — IMO0001 Reserved for inherently not codable concepts without codable children: Secondary | ICD-10-CM

## 2018-10-04 MED ORDER — METHYLPREDNISOLONE 4 MG PO TBPK: ORAL_TABLET | ORAL | 0 refills | Status: DC

## 2018-10-04 NOTE — Progress Notes (Addendum)
Subjective: 68 year old female presents the office today for follow-up of pain concerns of swelling to both of her feet.  She states that her left ankle and foot is been hurting because she thinks she may be compensating for the right foot.  She still gets swelling of the right foot but overall the pain on the right foot is improved.  She has gone to physical therapy couple times but she states that she has not been going very often because of the co-pay.  She also has not been doing home exercises that were given to her.  She also has decreased the gabapentin she is taking because of side effects she is currently taking gabapentin 200 mg 3 times a day tolerating this dose well.  She is still in surgical shoe on the right side.  The swelling settled with a regular shoe. Denies any systemic complaints such as fevers, chills, nausea, vomiting. No acute changes since last appointment, and no other complaints at this time.   Objective: AAO x3, NAD DP/PT pulses palpable bilaterally, CRT less than 3 seconds On the right foot there is decreased swelling but there is no erythema or warmth there is equal temperature bilaterally.  Overall is decreased tenderness palpation.  She also reports the toe position has improved as far as sticking in the air.  There is mild transverse plane deformity of the second third toes of the right foot.   On the left foot there is tenderness in the course the posterior tibial tendon.  There is no area pinpoint redness or pain to vibratory sensation.  Mild swelling to the ankle.  No open lesions or pre-ulcerative lesions.  No pain with calf compression, swelling, warmth, erythema  Assessment: Right foot CRPS, left ankle tendinitis  Plan: -All treatment options discussed with the patient including all alternatives, risks, complications.  -X-rays were obtained and reviewed with the patient. No evidence of acute fracture or stress fracture. Atrophy noted on the right side. There is  evidence of likely old fracture to the left fibula.  -Again today we had a very long discussion regards to treatment options.  On the right foot I encouraged her to continue with home exercises.  I would order physical therapy through [facility] as she cannot go to [facility] anymore.  We discussed importance of continue with physical therapy and also encouraged her to do home exercises every day.  She does not do anything at home until it starts to hurt.  I want her to use on a consecutive basis.  Also order TENS unit for the right side.  Also can order toe separators for her. Louretta Parma boot was applied to the left side today and precautions were advised on when to remove this.  Also prescribed a Medrol Dosepak. -She has appointment with pain management in 2 weeks.I will see her back in 3 weeks or sooner if any issues arise.  Trula Slade DPM  -Patient encouraged to call the office with any questions, concerns, change in symptoms.

## 2018-10-04 NOTE — Telephone Encounter (Signed)
I spoke with [facility] [place] and she states she will complete the order/Letter of Medical Necessity form from current clinicals and demographics. Faxed required pt information to [facility].

## 2018-10-04 NOTE — Telephone Encounter (Signed)
-----   Message from Trula Slade, DPM sent at 10/04/2018  1:42 PM EDT ----- Can you please order [facility] for CRPS. Also, do we have a contact for a rep to order a TENS unit for her?

## 2018-10-04 NOTE — Patient Instructions (Signed)
[facility] An [employer] is a type of bandage (dressing) for the foot and leg. The dressing is a gauze wrap that is soaked with a type of medicine called zinc oxide. The gauze may also include other lotions and medicines that help in wound healing, such as calamine. An Unna boot may be used to treat:  Open sores (ulcers) on the foot, heel, or leg.  Swelling from disorders that affect the veins or lymphatic system (lymphedema).  Skin conditions such as chronic inflammation caused by poor blood flow (stasis dermatitis). The dressing is applied by a health care provider. The gauze is wrapped around your lower extremity in several layers, usually starting at the toes and going upward to the knee. A dry outer wrap goes over the medicated wrap for support and compression.  Before applying the [employer], your health care provider will clean your leg and foot and may apply an antibiotic ointment. You may be asked to raise (elevate) your leg for a while to reduce swelling before the boot is applied. The boot will dry and harden after it is applied. The boot may need to be changed or replaced about twice a week. Follow these instructions at home: [place] as told by your health care provider.  You may need to wear a slipper or shoe over the boot that is one or two sizes larger than normal.  Check the skin around the boot every day. Tell your health care provider about any concerns.  Do not stick anything inside the boot to scratch your skin. Doing that increases your risk of infection.  Keep your [employer] clean and dry.  Check every day for signs of infection. Check for: ? Redness, swelling, or pain in your foot or toes. ? Fluid or blood coming from the boot. ? Pus or a bad smell coming from the boot.  Remove the boot and call your health care provider if you have signs of poor blood flow, such as: ? Your toes tingle or become numb. ? Your toes turn cold or turn blue or  pale. ? Your toes are more swollen or painful. ? You are unable to move your toes. Activity  You may walk with the boot once it has dried. Ask your health care provider how much walking is safe for you.  Avoid sitting for a long time without moving. Get up to take short walks as told by your health care provider. This is important to improve blood flow. Bathing  Do not take baths, swim, or use a hot tub until your health care provider approves. Ask your health care provider if you may take showers.  If your health care provider approves a bath or a shower, do not let the Unna boot get wet. ? If you take a shower, cover the boot with a watertight covering. ? If you take a bath, keep your leg with the boot out of the tub. General instructions  Keep your leg elevated above the level of your heart while you are sitting or lying down. This will decrease swelling.  Do not sit with your knee bent for long periods of time.  Take over-the-counter and prescription medicines only as told by your health care provider.  Do not use any products that contain nicotine or tobacco, such as cigarettes, e-cigarettes, and chewing tobacco. These can delay healing. If you need help quitting, ask your health care provider.  Keep all follow-up visits as  told by your health care provider. This is important. Contact a health care provider if:  Your skin feels itchy inside the boot.  You have a burning sensation, a rash, or itchy, red, swollen areas of skin (hives) in the boot area.  You have a fever or chills.  You have any signs of infection, such as: ? New redness, swelling, or pain. ? More fluid or blood coming from the boot. ? Pus or a bad smell coming from the boot.  You have increased numbness or pain in your foot or toes.  You have any changes in skin color on your foot or toes, such as the skin turning blue or pale or developing patchy areas with spots.  Your boot has been damaged or feels  like it is no longer fitting properly. Summary  An Louretta Parma boot is a type of bandage (dressing) system for the foot and leg.  The dressing is a gauze wrap that is soaked with a type of medicine (zinc oxide) to treat foot, heel, or leg ulcers, swelling from disorders that affect the veins or lymphatic system (lymphedema), and skin conditions caused by poor blood flow (stasis dermatitis).  This dressing is applied by a health care provider. After it is applied, the boot will dry and harden.  The boot may need to be changed or replaced about twice a week.  Let your health care provider know if you have any signs of poor blood flow or infection. This information is not intended to replace advice given to you by your health care provider. Make sure you discuss any questions you have with your health care provider. Document Released: 01/31/2018 Document Revised: 01/31/2018 Document Reviewed: 01/31/2018 Elsevier Interactive Patient Education  2019 [employer].

## 2018-10-04 NOTE — Telephone Encounter (Signed)
Referral to [facility] PT for CRPS of right foot and ankle, evaluation and treatment 3 x week, 4 weeks.ref

## 2018-10-05 ENCOUNTER — Telehealth (HOSPITAL_COMMUNITY): Payer: Self-pay

## 2018-10-05 NOTE — Telephone Encounter (Signed)
I called Ms. Kasprzak to discuss the need for her to be seen for an in clinic evaluation. I had been asked by front office staff to review patient and determine if this was necessary. I believe it is, after calling Ms. Bovard I found she already has an appointment scheduled and had done this with front office staff this morning. I reminded her of the time 1:30 PM next Thursday.   Kipp Brood, PT, DPT, [facility] Physical Therapist with [facility]  10/05/2018 2:55 PM

## 2018-10-11 ENCOUNTER — Ambulatory Visit (HOSPITAL_COMMUNITY): Payer: Medicare Other | Attending: Podiatry

## 2018-10-11 ENCOUNTER — Encounter (HOSPITAL_COMMUNITY): Payer: Self-pay

## 2018-10-11 ENCOUNTER — Other Ambulatory Visit: Payer: Self-pay

## 2018-10-11 DIAGNOSIS — R262 Difficulty in walking, not elsewhere classified: Secondary | ICD-10-CM | POA: Diagnosis not present

## 2018-10-11 DIAGNOSIS — M25572 Pain in left ankle and joints of left foot: Secondary | ICD-10-CM | POA: Diagnosis not present

## 2018-10-11 DIAGNOSIS — R600 Localized edema: Secondary | ICD-10-CM | POA: Diagnosis not present

## 2018-10-11 DIAGNOSIS — M25571 Pain in right ankle and joints of right foot: Secondary | ICD-10-CM

## 2018-10-11 NOTE — Therapy (Signed)
[place], [place], [address] Phone: [phone]   Fax:  [phone]  Physical Therapy Evaluation  Patient Details  Name: Rebecca Osborne MRN: [id number] Date of Birth: 08/20/1950 Referring Provider (PT): Trula Slade, DPM   Encounter Date: 10/11/2018  PT End of Session - 10/11/18 1632    Visit Number  1    Number of Visits  6    Date for PT Re-Evaluation  11/01/18    Authorization Type  [employer] Medicare    Authorization Time Period  10/11/18 to 11/01/18    Authorization - Visit Number  1    Authorization - Number of Visits  10    PT Start Time  1325    PT Stop Time  3710    PT Time Calculation (min)  50 min    Activity Tolerance  Patient limited by pain;Patient tolerated treatment well    Behavior During Therapy  [facility] for tasks assessed/performed       Past Medical History:  Diagnosis Date  . Allergy   . Cancer (HCC)    cervical cancer cells  . GERD (gastroesophageal reflux disease)   . Osteopenia     Past Surgical History:  Procedure Laterality Date  . ABDOMINAL HYSTERECTOMY    . BOWEL RESECTION N/A 06/22/2016   Procedure: SMALL BOWEL RESECTION;  Surgeon: Vickie Epley, MD;  Location: [facility];  Service: General;  Laterality: N/A;  . CHOLECYSTECTOMY  2005  . FRACTURE SURGERY     rt leg  . LAPAROTOMY N/A 06/22/2016   Procedure: EXPLORATORY LAPAROTOMY;  Surgeon: Vickie Epley, MD;  Location: [facility];  Service: General;  Laterality: N/A;  . LYSIS OF ADHESION N/A 06/22/2016   Procedure: LYSIS OF ADHESION;  Surgeon: Vickie Epley, MD;  Location: [facility];  Service: General;  Laterality: N/A;    There were no vitals filed for this visit.   Subjective Assessment - 10/11/18 1329    Subjective  Pt states that in September of 2019, she states that the steel toe shoes she wore broke her 2nd toe. She states that she keeps having swelling around her toes, even though the break is healed. She states that her L  foot is starting to bother her and was just removed out of a soft cast last week. She reports that she has pain around her R ankle and under her toes. She states that both feet swell; has been to 2 vein specialists who told her that her venis were fine. She has a f/u on 10/22/18 to check and see if arthritis has checked in. She reports that WB aggravates her feet almost instantly; rest and elevation help relieve her pain. She is in a ortho shoe for the R foot but doesn't know how long she has to be in that. She reports n/t on the bottom and top of her foot, which comes and goes and she states she notices it when the foot is swollen. She denies DM. Has been taking PT in [place] but states that due to the co-pay she needed to switch clinics.     Limitations  Lifting;Standing;Walking;House hold activities    How long can you sit comfortably?  no issues    How long can you stand comfortably?  maybe 10 mins    How long can you walk comfortably?  8 hours if take a pain pill, can't hardly at all if she doesn't take one    Patient  Stated Goals  get it to where she can walk on it    Currently in Pain?  No/denies         [facility] PT Assessment - 10/11/18 0001      Assessment   Medical Diagnosis  Tendonitis, CRPS Type 1, first degree ankle sprain, swelling    Referring Provider (PT)  Trula Slade, DPM    Onset Date/Surgical Date  --   September 2019   Next MD Visit  3 weeks from last visit    Prior Therapy  yes in [facility] for same issue      Balance Screen   Has the patient fallen in the past 6 months  No    Has the patient had a decrease in activity level because of a fear of falling?   No    Is the patient reluctant to leave their home because of a fear of falling?   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      Observation/Other Assessments-Edema    Edema   Figure 8;Circumferential      Circumferential Edema   Circumferential - Right  8.75" at head of MT    Circumferential - Left   8.25" at head of MT      Figure 8 Edema   Figure 8 - Right   19.25"    navicular medially   Figure 8 - Left   19.25"   navicular medially     Sensation   Light Touch  Appears Intact      ROM / Strength   AROM / PROM / Strength  AROM;Strength      AROM   AROM Assessment Site  Ankle    Right Ankle Dorsiflexion  7    Right Ankle Plantar Flexion  46    Right Ankle Inversion  20    Right Ankle Eversion  10    Left Ankle Dorsiflexion  5    Left Ankle Plantar Flexion  50    Left Ankle Inversion  20    Left Ankle Eversion  10      Strength   Strength Assessment Site  Hip;Knee;Ankle    Right Hip Flexion  5/5    Right Hip Extension  3+/5    Right Hip ABduction  4/5    Left Hip Flexion  5/5    Left Hip Extension  3+/5    Left Hip ABduction  4/5    Right Ankle Dorsiflexion  4+/5    Right Ankle Inversion  5/5   no pain   Right Ankle Eversion  4+/5   no pain   Left Ankle Dorsiflexion  4/5    Left Ankle Inversion  4+/5   pain medial malleolus   Left Ankle Eversion  4+/5   pain medial malleolus     Palpation   Palpation comment  tenderness along peroneals and tibialis posterior bilaterally      Ambulation/Gait   Ambulation Distance (Feet)  338 Feet   3MWT   Assistive device  None   ortho shoe on R foot   Gait Pattern  Step-through pattern;Decreased stride length;Decreased stance time - right;Decreased stance time - left;Decreased dorsiflexion - right;Decreased dorsiflexion - left;Trendelenburg;Antalgic;Lateral trunk lean to left   generally stiff-legged,      Balance  Balance Assessed  Yes      Static Standing Balance   Static Standing - Balance Support  No upper extremity supported    Static Standing Balance -  Activities   Single Leg Stance - Right Leg;Single Leg Stance - Left Leg    Static Standing - Comment/# of Minutes  R: 3.3sec or < L:  5sec or <         Objective measurements completed on examination: See above findings.       PT Education - 10/11/18 1632    Education Details  exam findings, HEP, POC    Person(s) Educated  Patient    Methods  Explanation;Demonstration;Handout    Comprehension  Verbalized understanding;Returned demonstration       PT Short Term Goals - 10/11/18 1635      PT SHORT TERM GOAL #1   Title  Pt will be independent with HEP and report compliance with completing as instructed to facilitate reduced pain, improved QOL, and self-management of symptoms.    Time  1    Period  Weeks    Status  New    Target Date  10/18/18      PT SHORT TERM GOAL #2   Title  Pt will have improved bil DF by 3 deg and bil PF by 5 deg or > to facilitate reduced pain, improved gait, and stair ambulation.     Time  1    Period  Weeks    Status  New      PT SHORT TERM GOAL #3   Title  Pt will report being able to perform WB activities for 15 mins with 4/10 bil foot pain in order to allow her to perform [facility] chores with greater ease.     Time  1    Period  Weeks    Status  New      PT SHORT TERM GOAL #4   Title  Pt will be able tO perform bil SLS for 6 sec or > to demo improved functional strength of bil ankles and maximize gait and stairs.     Time  1    Period  Weeks    Status  New        PT Long Term Goals - 10/11/18 1636      PT LONG TERM GOAL #1   Title  Pt will have reduced circumferential edema at forefoot by 0.25" to facilitate reduced pain and to maximize gait and general tolerance to WB.     Time  3    Period  Weeks    Status  New    Target Date  11/01/18      PT LONG TERM GOAL #2   Title  Pt will report being able to perform WB activities for 25 mins or > with 3/10 bil foot pain in order to allow her to prepare a small meal for herself and perform self-hygiene tasks with greater ease.     Time  3    Period  Weeks    Status  New      PT LONG TERM GOAL #3   Title  Pt will have 175f  improvement during 3MWT with 4/10 bil foot pain and gait mechanics improved in order to maximize community access and ambulation.    Time  3    Period  Weeks    Status  New      PT LONG TERM GOAL #4   Title  Pt will have  1/2 grade improvement in MMT throughout all mm groups tested in order to reduce pain and improve functional tasks.    Time  3    Period  Weeks    Status  New             Plan - 10/11/18 1633    Clinical Impression Statement  Pt is 68YO F who presents to OPPT with c/o bil foot/ankle pain, the R since September 2019 after breaking her 2nd digit, and the left more recently from favoring the R foot. She presents with very mild deficits in ROM, increased edema at R forefoot, difficulty WB, walking, standing, balance, hip, core, ankle strength, and functional strength. She also has pain with palpation throughout ankle mm bilaterally, L>R. When standing barefoot on floor, pt unable to get R toes on the ground, potentially due to the swelling in the forefoot. 3MWT limited due to antalgic gait and step downs challenging for pt especially on the R as she had increased foot ER, knee valgus, and pelvic rotation due to pain and lack of functional ROM. Unsure what is causing pt's continued swelling in the foot. Pt recently d/c from PT clinic in [place] due to co-pay. Pt needs skilled intervention to address these impairments in order ot attempt at reducing her pain, reducing swelling, and maximizing QOL. Feel pt would benefit from manual therapy to address impairments noted above and educated pt on risks of in-clinic visits and she verbalized understanding and wished to continue with in-clinic visits.     Personal Factors and Comorbidities  Age;Comorbidity 2;Finances;Past/Current Experience;Profession;Time since onset of injury/illness/exacerbation    Comorbidities  see above    Examination-Activity Limitations  Carry;Lift;Locomotion Level;Squat;Stairs;Stand    Examination-Participation  Restrictions  Community Activity;Laundry;Meal Prep;Yard Work;Shop    Stability/Clinical Decision Making  Evolving/Moderate complexity    Clinical Decision Making  Moderate    Rehab Potential  Fair    PT Frequency  2x / week    PT Duration  3 weeks    PT Treatment/Interventions  ADLs/Self Care Home Management;Aquatic Therapy;Cryotherapy;Electrical Stimulation;Moist Heat;Traction;Ultrasound;DME Instruction;Gait training;Stair training;Functional mobility training;Therapeutic activities;Therapeutic exercise;Balance training;Neuromuscular re-education;Patient/family education;Orthotic Fit/Training;Manual techniques;Manual lymph drainage;Passive range of motion;Dry needling;Energy conservation;Taping;Splinting;Joint Manipulations    PT Next Visit Plan  administer FOTO, review goals, Trial Mirror therapy, consider obtaining compression stocking referral for edema; edema control, light mobs/STM for pain control, graded exposure to WB to improve tolerance toWB    PT Home Exercise Plan  eval: sidelying hip abd, seated ABCs, seated towel inv/ev, seated heel and toe raises    Consulted and Agree with Plan of Care  Patient       Patient will benefit from skilled therapeutic intervention in order to improve the following deficits and impairments:  Abnormal gait, Decreased activity tolerance, Decreased balance, Decreased endurance, Decreased mobility, Decreased range of motion, Decreased strength, Difficulty walking, Hypomobility, Increased edema, Increased fascial restricitons, Increased muscle spasms, Impaired flexibility, Improper body mechanics, Postural dysfunction, Pain  Visit Diagnosis: Pain in right ankle and joints of right foot - Plan: PT plan of care cert/re-cert  Pain in left ankle and joints of left foot - Plan: PT plan of care cert/re-cert  Localized edema - Plan: PT plan of care cert/re-cert  Difficulty in walking, not elsewhere classified - Plan: PT plan of care cert/re-cert     Problem  List Patient Active Problem List   Diagnosis Date Noted  . Small bowel obstruction due to adhesions ([place]) 06/22/2016  . GERD 08/31/2009  . DYSPHAGIA UNSPECIFIED 08/31/2009  [facility], [place] [address] [place], [place], [address] Phone: [phone]   Fax:  [phone]  Name: VANESSIA BOKHARI MRN: [id number] Date of Birth: 1951-05-18

## 2018-10-15 ENCOUNTER — Telehealth: Payer: Self-pay | Admitting: Podiatry

## 2018-10-15 ENCOUNTER — Other Ambulatory Visit: Payer: Self-pay | Admitting: Podiatry

## 2018-10-15 NOTE — Telephone Encounter (Signed)
Is this on the left or right foot?

## 2018-10-15 NOTE — Telephone Encounter (Signed)
Pt called back and requested that the prescription be sent to the [facility] on [address]

## 2018-10-15 NOTE — Telephone Encounter (Signed)
Pt would like more pain medication

## 2018-10-15 NOTE — Telephone Encounter (Signed)
Pt complains of really bad throbbing pain in ankle and heel, and behind the toes, and has taken off the soft cast, tried ibuprofen without relief.

## 2018-10-15 NOTE — Telephone Encounter (Signed)
Can you see if this started after she went to PT? I would keep a compression bandage on the foot to help with swelling, at least an ACE bandage. Also, can you see if anyone has contacted her about the TENS unit? Also the socks for the toe separators I ordered for her should be here today or tomorrow.

## 2018-10-18 ENCOUNTER — Ambulatory Visit (HOSPITAL_COMMUNITY): Payer: Medicare Other

## 2018-10-18 ENCOUNTER — Telehealth (HOSPITAL_COMMUNITY): Payer: Self-pay | Admitting: Family Medicine

## 2018-10-18 ENCOUNTER — Telehealth (HOSPITAL_COMMUNITY): Payer: Self-pay

## 2018-10-18 NOTE — Telephone Encounter (Signed)
Pt called very confussed thought we were doing surgery on a knot she found below her breast. I confirmed our PT apptment with Jerene Pitch today at 2:30. Then pt said she had a flat tire and she was going to fix it. She would be here if she could get here.

## 2018-10-18 NOTE — Telephone Encounter (Signed)
10/18/18 pt left a message asking [place] to call her back.  I did call back but there was no answer and unable to leave a message.

## 2018-10-18 NOTE — Telephone Encounter (Signed)
10/18/18  pt called back to say that she wanted to cancel today because she had to get the tire fixed on her car

## 2018-10-22 ENCOUNTER — Encounter: Payer: Self-pay | Admitting: Physical Medicine & Rehabilitation

## 2018-10-22 ENCOUNTER — Other Ambulatory Visit: Payer: Self-pay

## 2018-10-22 ENCOUNTER — Encounter: Payer: Medicare Other | Attending: Physical Medicine & Rehabilitation | Admitting: Physical Medicine & Rehabilitation

## 2018-10-22 VITALS — BP 113/71 | HR 85 | Temp 98.9°F | Ht 60.0 in | Wt 114.0 lb

## 2018-10-22 DIAGNOSIS — G90521 Complex regional pain syndrome I of right lower limb: Secondary | ICD-10-CM | POA: Insufficient documentation

## 2018-10-22 DIAGNOSIS — G479 Sleep disorder, unspecified: Secondary | ICD-10-CM | POA: Insufficient documentation

## 2018-10-22 DIAGNOSIS — M7751 Other enthesopathy of right foot: Secondary | ICD-10-CM | POA: Diagnosis not present

## 2018-10-22 DIAGNOSIS — R269 Unspecified abnormalities of gait and mobility: Secondary | ICD-10-CM | POA: Diagnosis not present

## 2018-10-22 MED ORDER — DULOXETINE HCL 30 MG PO CPEP: 30.0000 mg | ORAL_CAPSULE | Freq: Every day | ORAL | 1 refills | Status: DC

## 2018-10-22 MED ORDER — LIDOCAINE 5 % EX OINT: 1.0000 "application " | TOPICAL_OINTMENT | CUTANEOUS | 1 refills | Status: DC | PRN

## 2018-10-22 NOTE — Progress Notes (Signed)
Subjective:    Patient ID: Rebecca Osborne, female    DOB: 1950-10-11, 68 y.o.   MRN: [id number]  HPI Female with pmh of osteopenia, GERD, right leg fracture presents for evaluation for right leg pain. She notes she sprained her left ankle about 1 month ago and it is causing more pain. Right foot issues started 02/20/2018.  Getting worse.  Ibu and rest improves the pain. Activity exacerbates the pain. Throbbing, sharp pain.  No radiation.  Intermittent. Ibu relieves the pain for about 8 hours.  Associated hyperalgesia per report. Gabapentin does not help.  Denies falls. Pain limits activity. She is seeing Podiatry.  She saw Vascular and was told it was not a vascular issue, per patient.   Pain Inventory Average Pain 7 Pain Right Now 7 My pain is sharp, burning, dull and tingling  In the last 24 hours, has pain interfered with the following? General activity 7 Relation with others 0 Enjoyment of life 0 What TIME of day is your pain at its worst? morning and evening Sleep (in general) Fair  Pain is worse with: walking, standing and some activites Pain improves with: rest, therapy/exercise and medication Relief from Meds: 9  Mobility how many minutes can you walk? 10 ability to climb steps?  yes do you drive?  yes  Function not employed: date last employed na  Neuro/Psych tingling trouble walking  Prior Studies bone scan x-rays CT/MRI  Physicians involved in your care Dr. Nelma Rothman and Ankle   Family History  Problem Relation Age of Onset  . COPD Mother   . Heart disease Mother   . Miscarriages / [place] Mother   . Vision loss Mother        from shingles  . COPD Father   . Cancer Brother        leukemia  . Early death Brother    Social History   Socioeconomic History  . Marital status: Widowed    Spouse name: Not on file  . Number of children: Not on file  . Years of education: Not on file  . Highest education level: Not on file  Occupational History   . Not on file  Social Needs  . Financial resource strain: Not on file  . Food insecurity:    Worry: Not on file    Inability: Not on file  . Transportation needs:    Medical: Not on file    Non-medical: Not on file  Tobacco Use  . Smoking status: Never Smoker  . Smokeless tobacco: Never Used  Substance and Sexual Activity  . Alcohol use: No  . Drug use: No  . Sexual activity: Not Currently  Lifestyle  . Physical activity:    Days per week: Not on file    Minutes per session: Not on file  . Stress: Not on file  Relationships  . Social connections:    Talks on phone: Not on file    Gets together: Not on file    Attends religious service: Not on file    Active member of club or organization: Not on file    Attends meetings of clubs or organizations: Not on file    Relationship status: Not on file  Other Topics Concern  . Not on file  Social History Narrative  . Not on file   Past Surgical History:  Procedure Laterality Date  . ABDOMINAL HYSTERECTOMY    . BOWEL RESECTION N/A 06/22/2016   Procedure: SMALL BOWEL RESECTION;  Surgeon: Corene Cornea  Yolanda Bonine, MD;  Location: [facility];  Service: General;  Laterality: N/A;  . CHOLECYSTECTOMY  2005  . FRACTURE SURGERY     rt leg  . LAPAROTOMY N/A 06/22/2016   Procedure: EXPLORATORY LAPAROTOMY;  Surgeon: Vickie Epley, MD;  Location: [facility];  Service: General;  Laterality: N/A;  . LYSIS OF ADHESION N/A 06/22/2016   Procedure: LYSIS OF ADHESION;  Surgeon: Vickie Epley, MD;  Location: [facility];  Service: General;  Laterality: N/A;   Past Medical History:  Diagnosis Date  . Allergy   . Cancer (HCC)    cervical cancer cells  . GERD (gastroesophageal reflux disease)   . Osteopenia    BP 113/71   Pulse 85   Temp 98.9 F (37.2 C)   Ht 5' (1.524 m)   Wt 114 lb (51.7 kg)   SpO2 96%   BMI 22.26 kg/m   Opioid Risk Score:   Fall Risk Score:  `1  Depression screen PHQ 2/9  Depression screen [facility] 2/9 10/22/2018 06/25/2018 07/14/2015   Decreased Interest 0 0 0  Down, Depressed, Hopeless 1 0 0  PHQ - 2 Score 1 0 0  Altered sleeping 0 - -  Tired, decreased energy 1 - -  Change in appetite 0 - -  Feeling bad or failure about yourself  0 - -  Trouble concentrating 0 - -  Moving slowly or fidgety/restless 0 - -  Suicidal thoughts 0 - -  PHQ-9 Score 2 - -  Difficult doing work/chores Not difficult at all - -    Review of Systems  Constitutional: Positive for appetite change and unexpected weight change.  HENT: Negative.   Eyes: Negative.   Respiratory: Negative.   Cardiovascular: Negative.   Gastrointestinal: Negative.   Endocrine: Negative.   Genitourinary: Negative.   Musculoskeletal: Positive for arthralgias, gait problem and myalgias.  Skin: Negative.   Allergic/Immunologic: Negative.   Hematological: Negative.   Psychiatric/Behavioral: Negative.   All other systems reviewed and are negative.      Objective:   Physical Exam Gen: NAD. Vital signs reviewed HENT: Normocephalic, Atraumatic Eyes: EOMI. No discharge.  Cardio: No JVD. Pulm: Effort normal Abd: Soft MSK:  Gait Antalgic  TTP diffusely.    L>R foot edema  Limited ROM in right toes Neuro:  Sensation intact to light touch in all LE dermatomes - no allodynia  Strength      5/5 in all LE myotomes, except right toes   Skin: Changes in toe nails right foot.    Right foot warmer than left foot Psych: Slowed    Assessment & Plan:  Female with pmh of osteopenia, GERD, right leg fracture presents for evaluation for right leg pain  1. CRPS  Xray from 10/2018 reviewed without any obvious fracture, edema noted  Labs reviewed  Will order Triple phase bone scan  Referral information reviewed  PMAWARE reviewed  Cont cold  PT ordered Podiatry, cont   Podiatry ordered TENS, awaiting arrival   Cont Bracing per podiatry  Cont Ibu 600 TID per Podiatry, may need to take a PPI if necessary give history of GERD  Will consider Voltaren gel/Lidoderm   Will order Cymbalta 63m daily with food  Will consider referral to Psychology  Will order Lidocaine ointment  Will order Vit B12/D/Mag  Patient states main goal is to be more active   Patient believes she was here for injection for arthritis and is seems confused when explaining CRPS   2. Sleep disturbance  Will order Elavil 61m qhs  See #1  3. Left foot tendonitis  Encouraged rest, Ice, compression, elevation  4. Gait abnormality  See #1

## 2018-10-22 NOTE — Telephone Encounter (Signed)
[name],2020 and stated per Dr Jacqualyn Posey would like for you to continue the physical therapy even though you are in pain after going, it helps with active and I am sending you a pair of toe less socks that Dr Jacqualyn Posey would like for you to try and you also have an appointment with pain management on Monday Oct 22, 2018 and to call the [place] office if any concerns or questions. Rebecca Osborne

## 2018-10-23 ENCOUNTER — Telehealth (HOSPITAL_COMMUNITY): Payer: Self-pay | Admitting: Family Medicine

## 2018-10-23 ENCOUNTER — Ambulatory Visit (HOSPITAL_COMMUNITY): Payer: Medicare Other | Admitting: Physical Therapy

## 2018-10-23 DIAGNOSIS — M25571 Pain in right ankle and joints of right foot: Secondary | ICD-10-CM | POA: Diagnosis not present

## 2018-10-23 DIAGNOSIS — M25572 Pain in left ankle and joints of left foot: Secondary | ICD-10-CM | POA: Diagnosis not present

## 2018-10-23 LAB — VITAMIN B12: Vitamin B-12: 335 pg/mL (ref 232–1245)

## 2018-10-23 LAB — MAGNESIUM: Magnesium: 2 mg/dL (ref 1.6–2.3)

## 2018-10-23 LAB — VITAMIN D 25 HYDROXY (VIT D DEFICIENCY, FRACTURES): Vit D, 25-Hydroxy: 20.5 ng/mL — ABNORMAL LOW (ref 30.0–100.0)

## 2018-10-23 NOTE — Telephone Encounter (Signed)
10/23/18  pt said she didn't have any gas in her car

## 2018-10-25 ENCOUNTER — Ambulatory Visit (HOSPITAL_COMMUNITY): Payer: Medicare Other

## 2018-10-25 ENCOUNTER — Other Ambulatory Visit: Payer: Self-pay

## 2018-10-25 ENCOUNTER — Encounter (HOSPITAL_COMMUNITY): Payer: Self-pay

## 2018-10-25 ENCOUNTER — Ambulatory Visit (INDEPENDENT_AMBULATORY_CARE_PROVIDER_SITE_OTHER): Payer: Medicare Other | Admitting: Podiatry

## 2018-10-25 ENCOUNTER — Encounter: Payer: Self-pay | Admitting: Podiatry

## 2018-10-25 VITALS — Temp 97.3°F

## 2018-10-25 DIAGNOSIS — M779 Enthesopathy, unspecified: Secondary | ICD-10-CM

## 2018-10-25 DIAGNOSIS — G9050 Complex regional pain syndrome I, unspecified: Secondary | ICD-10-CM

## 2018-10-25 DIAGNOSIS — R262 Difficulty in walking, not elsewhere classified: Secondary | ICD-10-CM | POA: Diagnosis not present

## 2018-10-25 DIAGNOSIS — M25571 Pain in right ankle and joints of right foot: Secondary | ICD-10-CM | POA: Diagnosis not present

## 2018-10-25 DIAGNOSIS — M25572 Pain in left ankle and joints of left foot: Secondary | ICD-10-CM | POA: Diagnosis not present

## 2018-10-25 DIAGNOSIS — R600 Localized edema: Secondary | ICD-10-CM

## 2018-10-25 NOTE — Therapy (Signed)
[place], [place], [address] Phone: [phone]   Fax:  [phone]  Physical Therapy Treatment  Patient Details  Name: Rebecca Osborne MRN: [id number] Date of Birth: 02-11-1951 Referring Provider (PT): Trula Slade, DPM   Encounter Date: 10/25/2018  PT End of Session - 10/25/18 1335    Visit Number  2    Number of Visits  6    Date for PT Re-Evaluation  11/01/18    Authorization Type  [employer] Medicare    Authorization Time Period  10/11/18 to 11/01/18    Authorization - Visit Number  2    Authorization - Number of Visits  10    PT Start Time  3976    PT Stop Time  1406    PT Time Calculation (min)  31 min    Activity Tolerance  Patient limited by pain;Patient tolerated treatment well    Behavior During Therapy  [facility] for tasks assessed/performed       Past Medical History:  Diagnosis Date  . Allergy   . Cancer (HCC)    cervical cancer cells  . GERD (gastroesophageal reflux disease)   . Osteopenia     Past Surgical History:  Procedure Laterality Date  . ABDOMINAL HYSTERECTOMY    . BOWEL RESECTION N/A 06/22/2016   Procedure: SMALL BOWEL RESECTION;  Surgeon: Vickie Epley, MD;  Location: [facility];  Service: General;  Laterality: N/A;  . CHOLECYSTECTOMY  2005  . FRACTURE SURGERY     rt leg  . LAPAROTOMY N/A 06/22/2016   Procedure: EXPLORATORY LAPAROTOMY;  Surgeon: Vickie Epley, MD;  Location: [facility];  Service: General;  Laterality: N/A;  . LYSIS OF ADHESION N/A 06/22/2016   Procedure: LYSIS OF ADHESION;  Surgeon: Vickie Epley, MD;  Location: [facility];  Service: General;  Laterality: N/A;    There were no vitals filed for this visit.  Subjective Assessment - 10/25/18 1336    Subjective  Pt denies any real changes in her foot pain. She needs to leave early cuase she has a f/u with Dr. Jacqualyn Posey. She received her TENS unit yesterday, but isn't quite sure how to use it.    Limitations   Lifting;Standing;Walking;House hold activities    How long can you sit comfortably?  no issues    How long can you stand comfortably?  maybe 10 mins    How long can you walk comfortably?  8 hours if take a pain pill, can't hardly at all if she doesn't take one    Patient Stated Goals  get it to where she can walk on it    Currently in Pain?  No/denies         [facility] PT Assessment - 10/25/18 0001      Observation/Other Assessments   Focus on Therapeutic Outcomes (FOTO)   56% limited          [facility] Adult PT Treatment/Exercise - 10/25/18 0001      Exercises   Exercises  Ankle      Ankle Exercises: Seated   ABC's  1 rep    ABC's Limitations  BLE    Towel Inversion/Eversion Limitations  x10 reps BLE, cues to reduce hip compensation    BAPS  Sitting;Level 1;5 reps    BAPS Weights (lbs)  DF/PF, CW/CCW BLE; max cues and A for CW/CCW    Heel Slides  Right;Left;10 reps    Heel Slides Limitations  x5" holds, for  PF/DF ROM (keeping foot flat entire time)           PT Education - 10/25/18 1335    Education Details  goals, HEP, exercise technique    Person(s) Educated  Patient    Methods  Explanation;Demonstration    Comprehension  Verbalized understanding;Returned demonstration       PT Short Term Goals - 10/11/18 1635      PT SHORT TERM GOAL #1   Title  Pt will be independent with HEP and report compliance with completing as instructed to facilitate reduced pain, improved QOL, and self-management of symptoms.    Time  1    Period  Weeks    Status  New    Target Date  10/18/18      PT SHORT TERM GOAL #2   Title  Pt will have improved bil DF by 3 deg and bil PF by 5 deg or > to facilitate reduced pain, improved gait, and stair ambulation.     Time  1    Period  Weeks    Status  New      PT SHORT TERM GOAL #3   Title  Pt will report being able to perform WB activities for 15 mins with 4/10 bil foot pain in order to allow her to perform [facility] chores with greater ease.      Time  1    Period  Weeks    Status  New      PT SHORT TERM GOAL #4   Title  Pt will be able tO perform bil SLS for 6 sec or > to demo improved functional strength of bil ankles and maximize gait and stairs.     Time  1    Period  Weeks    Status  New        PT Long Term Goals - 10/11/18 1636      PT LONG TERM GOAL #1   Title  Pt will have reduced circumferential edema at forefoot by 0.25" to facilitate reduced pain and to maximize gait and general tolerance to WB.     Time  3    Period  Weeks    Status  New    Target Date  11/01/18      PT LONG TERM GOAL #2   Title  Pt will report being able to perform WB activities for 25 mins or > with 3/10 bil foot pain in order to allow her to prepare a small meal for herself and perform self-hygiene tasks with greater ease.     Time  3    Period  Weeks    Status  New      PT LONG TERM GOAL #3   Title  Pt will have 159f improvement during 3MWT with 4/10 bil foot pain and gait mechanics improved in order to maximize community access and ambulation.    Time  3    Period  Weeks    Status  New      PT LONG TERM GOAL #4   Title  Pt will have 1/2 grade improvement in MMT throughout all mm groups tested in order to reduce pain and improve functional tasks.    Time  3    Period  Weeks    Status  New            Plan - 10/25/18 1409    Clinical Impression Statement  Began session by reviewing goals and administering FOTO; pt scored 56%  limited on FOTO and no f/u questions regarding goals. Reviewed HEP as pt unable to verbalize exercises and reported doing the wrong exercises. Added seated heel slides for improve DF/PF ROM. Initiated seated BAPS L1 for general mobility and improved tolerance to ankle ROM; max cues and A for CW/CCW circles as pt had difficulty performing without hip compensation and movement was just generally uncoordinated. Session limited as pt had to leave early for f/u with Dr. Jacqualyn Posey. Continue as planned, progressing as  able.     Personal Factors and Comorbidities  Age;Comorbidity 2;Finances;Past/Current Experience;Profession;Time since onset of injury/illness/exacerbation    Comorbidities  see above    Examination-Activity Limitations  Carry;Lift;Locomotion Level;Squat;Stairs;Stand    Examination-Participation Restrictions  Community Activity;Laundry;Meal Prep;Yard Work;Shop    Stability/Clinical Decision Making  Evolving/Moderate complexity    Rehab Potential  Fair    PT Frequency  2x / week    PT Duration  3 weeks    PT Treatment/Interventions  ADLs/Self Care Home Management;Aquatic Therapy;Cryotherapy;Electrical Stimulation;Moist Heat;Traction;Ultrasound;DME Instruction;Gait training;Stair training;Functional mobility training;Therapeutic activities;Therapeutic exercise;Balance training;Neuromuscular re-education;Patient/family education;Orthotic Fit/Training;Manual techniques;Manual lymph drainage;Passive range of motion;Dry needling;Energy conservation;Taping;Splinting;Joint Manipulations    PT Next Visit Plan  consider trial of Mirror therapy, consider obtaining compression stocking referral for edema; edema control, light mobs/STM for pain control, graded exposure to WB to improve tolerance to WB, continue BAPS    PT Home Exercise Plan  eval: sidelying hip abd, seated ABCs, seated towel inv/ev, seated heel and toe raises    Consulted and Agree with Plan of Care  Patient       Patient will benefit from skilled therapeutic intervention in order to improve the following deficits and impairments:  Abnormal gait, Decreased activity tolerance, Decreased balance, Decreased endurance, Decreased mobility, Decreased range of motion, Decreased strength, Difficulty walking, Hypomobility, Increased edema, Increased fascial restricitons, Increased muscle spasms, Impaired flexibility, Improper body mechanics, Postural dysfunction, Pain  Visit Diagnosis: Pain in right ankle and joints of right foot  Pain in left ankle  and joints of left foot  Localized edema  Difficulty in walking, not elsewhere classified     Problem List Patient Active Problem List   Diagnosis Date Noted  . Complex regional pain syndrome type 1 of right lower extremity 10/22/2018  . Small bowel obstruction due to adhesions ([place]) 06/22/2016  . GERD 08/31/2009  . DYSPHAGIA UNSPECIFIED 08/31/2009       Geraldine Solar PT, DPT   [place] [address] [place], [place], [address] Phone: [phone]   Fax:  [phone]  Name: CRIMSON DUBBERLY MRN: [id number] Date of Birth: [birth year]

## 2018-10-25 NOTE — Progress Notes (Signed)
Subjective: 68 year old female presents the office today for follow-up of pain concerns of swelling to both of her feet, CRPS.  She states that overall her feet are doing better.  She has been continuing physical therapy.  She is been trying to home exercises well to keep the toes bending.  She has still some discomfort on the medial ankle from where she states that she has sprained this previously.  She did get the TENS unit yesterday she did use it for the first time.  She also picked up the Cymbalta but she has not yet started this.  She uses ibuprofen as needed.  No recent injury or falls.  She is still wearing a surgical shoe on the left side.  She denies any fevers, chills, nausea, vomiting.  No calf pain chest pain shortness of breath.   Objective: AAO x3, NAD DP/PT pulses palpable bilaterally, CRT less than 3 seconds Overall her significant improvement in swelling.  There is no erythema or warmth and the temperature is equal bilaterally.  There is some discomfort with MPJ range of motion on the right side but has no other areas of tenderness.  On the left ankle there is tenderness in the course of the posterior tibial tendon although the tendon appears to be intact overall.  There is no other areas of tenderness. No pain with calf compression, swelling, warmth, erythema  Assessment: Right foot CRPS, left ankle tendinitis  Plan: -All treatment options discussed with the patient including all alternatives, risks, complications.  -Overall she has made good improvements.  We encouraged to continue with physical therapy and home rehab exercises.  I want her to continue with the TENS unit.  Continue treatment as prescribed by Dr. Posey Pronto as well.  She does not start the Cymbalta today.  Continue the Voltaren gel as well which she just picked up as well and she has not been using. -Discussed with her the goal is to get back into a shoe over the next couple weeks as she is progressing well at this  point.  Trula Slade DPM

## 2018-10-30 ENCOUNTER — Other Ambulatory Visit: Payer: Self-pay

## 2018-10-30 ENCOUNTER — Encounter (HOSPITAL_COMMUNITY): Payer: Self-pay

## 2018-10-30 ENCOUNTER — Ambulatory Visit (HOSPITAL_COMMUNITY)
Admission: RE | Admit: 2018-10-30 | Discharge: 2018-10-30 | Disposition: A | Discharge status: Home / Self Care | Payer: Medicare Other | Source: Ambulatory Visit | Attending: Physical Medicine & Rehabilitation | Admitting: Physical Medicine & Rehabilitation

## 2018-10-30 ENCOUNTER — Ambulatory Visit (HOSPITAL_COMMUNITY): Payer: Medicare Other

## 2018-10-30 DIAGNOSIS — R2241 Localized swelling, mass and lump, right lower limb: Secondary | ICD-10-CM | POA: Diagnosis not present

## 2018-10-30 DIAGNOSIS — R600 Localized edema: Secondary | ICD-10-CM

## 2018-10-30 DIAGNOSIS — M25571 Pain in right ankle and joints of right foot: Secondary | ICD-10-CM | POA: Diagnosis not present

## 2018-10-30 DIAGNOSIS — G90521 Complex regional pain syndrome I of right lower limb: Secondary | ICD-10-CM | POA: Diagnosis not present

## 2018-10-30 DIAGNOSIS — R262 Difficulty in walking, not elsewhere classified: Secondary | ICD-10-CM

## 2018-10-30 DIAGNOSIS — M25572 Pain in left ankle and joints of left foot: Secondary | ICD-10-CM | POA: Diagnosis not present

## 2018-10-30 MED ORDER — TECHNETIUM TC 99M MEDRONATE IV KIT
20.0000 | PACK | Freq: Once | INTRAVENOUS | Status: AC | PRN
  Administered 2018-10-30: 10:00:00 20.1 via INTRAVENOUS

## 2018-10-30 NOTE — Therapy (Signed)
[place], [place], [address] Phone: [phone]   Fax:  [phone]  Physical Therapy Treatment  Patient Details  Name: Rebecca Osborne MRN: [id number] Date of Birth: Aug 24, 1950 Referring Provider (PT): Trula Slade, DPM   Encounter Date: 10/30/2018  PT End of Session - 10/30/18 1400    Visit Number  3    Number of Visits  6    Date for PT Re-Evaluation  11/01/18    Authorization Type  [employer] Medicare    Authorization Time Period  10/11/18 to 11/01/18    Authorization - Visit Number  3    Authorization - Number of Visits  10    PT Start Time  5465    PT Stop Time  6812    PT Time Calculation (min)  40 min    Activity Tolerance  Patient limited by pain;Patient tolerated treatment well    Behavior During Therapy  [facility] for tasks assessed/performed       Past Medical History:  Diagnosis Date  . Allergy   . Cancer (HCC)    cervical cancer cells  . GERD (gastroesophageal reflux disease)   . Osteopenia     Past Surgical History:  Procedure Laterality Date  . ABDOMINAL HYSTERECTOMY    . BOWEL RESECTION N/A 06/22/2016   Procedure: SMALL BOWEL RESECTION;  Surgeon: Vickie Epley, MD;  Location: [facility];  Service: General;  Laterality: N/A;  . CHOLECYSTECTOMY  2005  . FRACTURE SURGERY     rt leg  . LAPAROTOMY N/A 06/22/2016   Procedure: EXPLORATORY LAPAROTOMY;  Surgeon: Vickie Epley, MD;  Location: [facility];  Service: General;  Laterality: N/A;  . LYSIS OF ADHESION N/A 06/22/2016   Procedure: LYSIS OF ADHESION;  Surgeon: Vickie Epley, MD;  Location: [facility];  Service: General;  Laterality: N/A;    There were no vitals filed for this visit.  Subjective Assessment - 10/30/18 1401    Subjective  Patient reports her ankles/feet are still hurting. She reports she went for a follow up last week with her podiatrist and they sent her for a bone density test which she had this morning. She reports her Lt ankle is  more swollen than usual.    Limitations  Lifting;Standing;Walking;House hold activities    How long can you sit comfortably?  no issues    How long can you stand comfortably?  maybe 10 mins    How long can you walk comfortably?  8 hours if take a pain pill, can't hardly at all if she doesn't take one    Patient Stated Goals  get it to where she can walk on it    Currently in Pain?  Yes    Pain Score  7     Pain Location  Foot   foot and ankle   Pain Orientation  Left    Pain Descriptors / Indicators  Aching    Pain Type  Chronic pain    Pain Onset  More than a month ago    Pain Frequency  Constant      [facility] Adult PT Treatment/Exercise - 10/30/18 0001      Exercises   Exercises  Ankle      Manual Therapy   Manual Therapy  Edema management;Joint mobilization    Manual therapy comments  performed seperate form other interventions    Edema Management  retrograde massage to bil ankles/feet in elevated position    Joint  Mobilization  AP glide to talcrural joint on bil ankles; 3x 30 seconds grade III oscillations      Ankle Exercises: Seated   ABC's  1 rep    ABC's Limitations  Bil LE    BAPS  Sitting;Level 2;10 reps    BAPS Limitations  DF/PF 15 reps, clockwise/counter-clockwise 10 reps    Other Seated Ankle Exercises  Rockerboard: Inversion/Eversion AROM; 15 reps bil LE        PT Education - 10/30/18 1444    Education Details  Educated to continue with HEP and on plan to update next session. Educated on next appointment time for Thursday at 11:30.     Person(s) Educated  Patient    Methods  Explanation    Comprehension  Verbalized understanding       PT Short Term Goals - 10/30/18 1401      PT SHORT TERM GOAL #1   Title  Pt will be independent with HEP and report compliance with completing as instructed to facilitate reduced pain, improved QOL, and self-management of symptoms.    Time  1    Period  Weeks    Status  Achieved    Target Date  10/18/18      PT SHORT TERM  GOAL #2   Title  Pt will have improved bil DF by 3 deg and bil PF by 5 deg or > to facilitate reduced pain, improved gait, and stair ambulation.     Time  1    Period  Weeks    Status  On-going      PT SHORT TERM GOAL #3   Title  Pt will report being able to perform WB activities for 15 mins with 4/10 bil foot pain in order to allow her to perform [facility] chores with greater ease.     Time  1    Period  Weeks    Status  On-going      PT SHORT TERM GOAL #4   Title  Pt will be able tO perform bil SLS for 6 sec or > to demo improved functional strength of bil ankles and maximize gait and stairs.     Time  1    Period  Weeks    Status  On-going        PT Long Term Goals - 10/30/18 1401      PT LONG TERM GOAL #1   Title  Pt will have reduced circumferential edema at forefoot by 0.25" to facilitate reduced pain and to maximize gait and general tolerance to WB.     Time  3    Period  Weeks    Status  On-going      PT LONG TERM GOAL #2   Title  Pt will report being able to perform WB activities for 25 mins or > with 3/10 bil foot pain in order to allow her to prepare a small meal for herself and perform self-hygiene tasks with greater ease.     Time  3    Period  Weeks    Status  On-going      PT LONG TERM GOAL #3   Title  Pt will have 163f improvement during 3MWT with 4/10 bil foot pain and gait mechanics improved in order to maximize community access and ambulation.    Time  3    Period  Weeks    Status  On-going      PT LONG TERM GOAL #4   Title  Pt will have 1/2 grade improvement in MMT throughout all mm groups tested in order to reduce pain and improve functional tasks.    Time  3    Period  Weeks    Status  On-going        Plan - 10/30/18 1400    Clinical Impression Statement  Session focused on ROM exercises for bil ankles and patient progressed BAPS to level 2 seated. She had greater difficulty with exercises with Lt LE today compared to Rt and was more limited with  dorsiflexion during ABC's and BAP's. Rockerboard was introduced to stretch patients range through eversion and inversion and she performed this with minimal difficulty and minimal cues. Pt's Lt anke appeared swollen today and was more swollen than usual per patient report. Retrograde massage performed and joint mobilization to bil ankles at EOS. Patient reported reduced pain following manual treatment. She will continue to benefit from skilled PT interventions to address impairments and progress gait and mobility.     Personal Factors and Comorbidities  Age;Comorbidity 2;Finances;Past/Current Experience;Profession;Time since onset of injury/illness/exacerbation    Comorbidities  see above    Examination-Activity Limitations  Carry;Lift;Locomotion Level;Squat;Stairs;Stand    Examination-Participation Restrictions  Community Activity;Laundry;Meal Prep;Yard Work;Shop    Stability/Clinical Decision Making  Evolving/Moderate complexity    Rehab Potential  Fair    PT Frequency  2x / week    PT Duration  3 weeks    PT Treatment/Interventions  ADLs/Self Care Home Management;Aquatic Therapy;Cryotherapy;Electrical Stimulation;Moist Heat;Traction;Ultrasound;DME Instruction;Gait training;Stair training;Functional mobility training;Therapeutic activities;Therapeutic exercise;Balance training;Neuromuscular re-education;Patient/family education;Orthotic Fit/Training;Manual techniques;Manual lymph drainage;Passive range of motion;Dry needling;Energy conservation;Taping;Splinting;Joint Manipulations    PT Next Visit Plan  Re-Assess on Thursday 11/01/18. consider trial of Mirror therapy, consider obtaining compression stocking referral for edema; edema control, light mobs/STM for pain control, graded exposure to WB to improve tolerance to WB, continue BAPS    PT Home Exercise Plan  eval: sidelying hip abd, seated ABCs, seated towel inv/ev, seated heel and toe raises    Consulted and Agree with Plan of Care  Patient        Patient will benefit from skilled therapeutic intervention in order to improve the following deficits and impairments:  Abnormal gait, Decreased activity tolerance, Decreased balance, Decreased endurance, Decreased mobility, Decreased range of motion, Decreased strength, Difficulty walking, Hypomobility, Increased edema, Increased fascial restricitons, Increased muscle spasms, Impaired flexibility, Improper body mechanics, Postural dysfunction, Pain  Visit Diagnosis: Pain in right ankle and joints of right foot  Pain in left ankle and joints of left foot  Localized edema  Difficulty in walking, not elsewhere classified     Problem List Patient Active Problem List   Diagnosis Date Noted  . Complex regional pain syndrome type 1 of right lower extremity 10/22/2018  . Small bowel obstruction due to adhesions ([place]) 06/22/2016  . GERD 08/31/2009  . DYSPHAGIA UNSPECIFIED 08/31/2009    Kipp Brood, PT, DPT, [facility] Physical Therapist with [facility]  10/30/2018 2:45 PM    [place], [place], [address] Phone: [phone]   Fax:  [phone]  Name: JOHNDA BILLIOT MRN: [id number] Date of Birth: [birth year]

## 2018-11-01 ENCOUNTER — Other Ambulatory Visit: Payer: Self-pay

## 2018-11-01 ENCOUNTER — Ambulatory Visit (HOSPITAL_COMMUNITY): Payer: Medicare Other | Admitting: Physical Therapy

## 2018-11-01 ENCOUNTER — Encounter (HOSPITAL_COMMUNITY): Payer: Self-pay | Admitting: Physical Therapy

## 2018-11-01 DIAGNOSIS — M25572 Pain in left ankle and joints of left foot: Secondary | ICD-10-CM

## 2018-11-01 DIAGNOSIS — R600 Localized edema: Secondary | ICD-10-CM | POA: Diagnosis not present

## 2018-11-01 DIAGNOSIS — R262 Difficulty in walking, not elsewhere classified: Secondary | ICD-10-CM

## 2018-11-01 DIAGNOSIS — M25571 Pain in right ankle and joints of right foot: Secondary | ICD-10-CM

## 2018-11-01 NOTE — Therapy (Signed)
[place], [place], [address] Phone: [phone]   Fax:  [phone]  Physical Therapy Treatment / Re-assessment  Patient Details  Name: Rebecca Osborne MRN: [id number] Date of Birth: Jan 15, 1951 Referring Provider (PT): Trula Slade, DPM   Encounter Date: 11/01/2018   Progress Note Reporting Period 10/11/18 to 11/01/18  See note below for Objective Data and Assessment of Progress/Goals.       PT End of Session - 11/01/18 1133    Visit Number  4    Number of Visits  12    Date for PT Re-Evaluation  11/22/18    Authorization Type  [employer] Medicare    Authorization Time Period  10/11/18 to 11/01/18; 11/01/18 - 11/23/18    Authorization - Visit Number  1    Authorization - Number of Visits  10    PT Start Time  1132    PT Stop Time  1210    PT Time Calculation (min)  38 min    Activity Tolerance  Patient limited by pain;Patient tolerated treatment well    Behavior During Therapy  [facility] for tasks assessed/performed       Past Medical History:  Diagnosis Date  . Allergy   . Cancer (HCC)    cervical cancer cells  . GERD (gastroesophageal reflux disease)   . Osteopenia     Past Surgical History:  Procedure Laterality Date  . ABDOMINAL HYSTERECTOMY    . BOWEL RESECTION N/A 06/22/2016   Procedure: SMALL BOWEL RESECTION;  Surgeon: Vickie Epley, MD;  Location: [facility];  Service: General;  Laterality: N/A;  . CHOLECYSTECTOMY  2005  . FRACTURE SURGERY     rt leg  . LAPAROTOMY N/A 06/22/2016   Procedure: EXPLORATORY LAPAROTOMY;  Surgeon: Vickie Epley, MD;  Location: [facility];  Service: General;  Laterality: N/A;  . LYSIS OF ADHESION N/A 06/22/2016   Procedure: LYSIS OF ADHESION;  Surgeon: Vickie Epley, MD;  Location: [facility];  Service: General;  Laterality: N/A;    There were no vitals filed for this visit.  Subjective Assessment - 11/01/18 1134    Subjective  Patient reported that she has tried to do her  exercises sometimes.     Limitations  Lifting;Standing;Walking;House hold activities    How long can you sit comfortably?  no issues    How long can you stand comfortably?  maybe 10 mins    How long can you walk comfortably?  8 hours if take a pain pill, can't hardly at all if she doesn't take one    Patient Stated Goals  get it to where she can walk on it    Currently in Pain?  Yes    Pain Score  6     Pain Location  Ankle    Pain Orientation  Left    Pain Descriptors / Indicators  Aching    Pain Onset  More than a month ago         [facility] PT Assessment - 11/01/18 0001      Assessment   Medical Diagnosis  Tendonitis, CRPS Type 1, first degree ankle sprain, swelling    Referring Provider (PT)  Trula Slade, DPM      Prior Function   Level of Independence  Independent      Circumferential Edema   Circumferential - Right  8.5" at head of MT    Circumferential - Left   8.25" at head of  MT      Sensation   Light Touch  Appears Intact      AROM   Right Ankle Dorsiflexion  10   was 7   Right Ankle Plantar Flexion  47   was 46   Right Ankle Inversion  20   was 20   Right Ankle Eversion  15   was 10   Left Ankle Dorsiflexion  6   was 5   Left Ankle Plantar Flexion  57   was 50   Left Ankle Inversion  23   was 20   Left Ankle Eversion  17   was 10     Strength   Right Hip Flexion  5/5   was 5   Right Hip Extension  4-/5   was 3+   Right Hip ABduction  4+/5   was 4   Left Hip Flexion  5/5   was 5   Left Hip Extension  4-/5   was 3+   Left Hip ABduction  4+/5   was 4   Right Ankle Dorsiflexion  4+/5   was 4+   Right Ankle Inversion  5/5    Right Ankle Eversion  4+/5   was 4+   Left Ankle Dorsiflexion  4+/5   was 4   Left Ankle Inversion  4+/5   was 4+   Left Ankle Eversion  4+/5   was 4+     Palpation   Palpation comment  tenderness along peroneals and tibialis posterior bilaterally      Ambulation/Gait   Ambulation Distance (Feet)  492 Feet    3 MWT     Static Standing Balance   Static Standing - Balance Support  No upper extremity supported    Static Standing Balance -  Activities   Single Leg Stance - Right Leg;Single Leg Stance - Left Leg    Static Standing - Comment/# of Minutes  3'' Left; 4'' right                   [facility] Adult PT Treatment/Exercise - 11/01/18 0001      Ankle Exercises: Seated   ABC's  1 rep    ABC's Limitations  Bil LE    BAPS  Sitting;Level 2;10 reps             PT Education - 11/01/18 1426    Education Details  Educated on examination findings.     Person(s) Educated  Patient    Methods  Explanation    Comprehension  Verbalized understanding       PT Short Term Goals - 11/01/18 1427      PT SHORT TERM GOAL #1   Title  Pt will be independent with HEP and report compliance with completing as instructed to facilitate reduced pain, improved QOL, and self-management of symptoms.    Time  1    Period  Weeks    Status  Achieved    Target Date  10/18/18      PT SHORT TERM GOAL #2   Title  Pt will have improved bil DF by 3 deg and bil PF by 5 deg or > to facilitate reduced pain, improved gait, and stair ambulation.     Time  1    Period  Weeks    Status  On-going      PT SHORT TERM GOAL #3   Title  Pt will report being able to perform WB activities for 15 mins with 4/10  bil foot pain in order to allow her to perform [facility] chores with greater ease.     Time  1    Period  Weeks    Status  Achieved      PT SHORT TERM GOAL #4   Title  Pt will be able tO perform bil SLS for 6 sec or > to demo improved functional strength of bil ankles and maximize gait and stairs.     Time  1    Period  Weeks    Status  On-going        PT Long Term Goals - 11/01/18 1431      PT LONG TERM GOAL #1   Title  Pt will have reduced circumferential edema at forefoot by 0.25" to facilitate reduced pain and to maximize gait and general tolerance to WB.     Baseline  11/01/18: See objective  measures    Time  3    Period  Weeks    Status  Partially Met      PT LONG TERM GOAL #2   Title  Pt will report being able to perform WB activities for 25 mins or > with 3/10 bil foot pain in order to allow her to prepare a small meal for herself and perform self-hygiene tasks with greater ease.     Baseline  11/01/18: Patient reported ability to stand and walk for at least 30 minutes    Time  3    Period  Weeks    Status  Achieved      PT LONG TERM GOAL #3   Title  Pt will have 110f improvement during 3MWT with 4/10 bil foot pain and gait mechanics improved in order to maximize community access and ambulation.    Baseline  11/01/18: 492 feet this date    Time  3    Period  Weeks    Status  Achieved      PT LONG TERM GOAL #4   Title  Pt will have 1/2 grade improvement in MMT throughout all mm groups tested in order to reduce pain and improve functional tasks.    Baseline  11/01/18: See MMT    Time  3    Period  Weeks    Status  On-going            Plan - 11/01/18 1437    Clinical Impression Statement  This session performed a re-assessment of patient's progress towards goals. Patient had achieved some goals including weight bearing goals. Patient had made some progress in her ROM, however was still limited in this as well as in her lower extremity strength. Patient is also limited in her balance. Patient ambulating with post-op medical shoe on the right lower extremity. Continued this date with ROM exercises, plan to update patient's HEP in upcoming sessions, and resume manual therapy. Plan to continue therapy twice a week for an additional 3 weeks.     Personal Factors and Comorbidities  Age;Comorbidity 2;Finances;Past/Current Experience;Profession;Time since onset of injury/illness/exacerbation    Comorbidities  see above    Examination-Activity Limitations  Carry;Lift;Locomotion Level;Squat;Stairs;Stand    Examination-Participation Restrictions  Community Activity;Laundry;Meal  Prep;Yard Work;Shop    Stability/Clinical Decision Making  Evolving/Moderate complexity    Rehab Potential  Fair    PT Frequency  2x / week    PT Duration  3 weeks    PT Treatment/Interventions  ADLs/Self Care Home Management;Aquatic Therapy;Cryotherapy;Electrical Stimulation;Moist Heat;Traction;Ultrasound;DME Instruction;Gait training;Stair training;Functional mobility training;Therapeutic activities;Therapeutic exercise;Balance training;Neuromuscular re-education;Patient/family education;Orthotic  Fit/Training;Manual techniques;Manual lymph drainage;Passive range of motion;Dry needling;Energy conservation;Taping;Splinting;Joint Manipulations    PT Next Visit Plan  Update HEP, resume manual. consider trial of Mirror therapy, consider obtaining compression stocking referral for edema; edema control, light mobs/STM for pain control, graded exposure to WB to improve tolerance to WB, continue BAPS    PT Home Exercise Plan  eval: sidelying hip abd, seated ABCs, seated towel inv/ev, seated heel and toe raises    Consulted and Agree with Plan of Care  Patient       Patient will benefit from skilled therapeutic intervention in order to improve the following deficits and impairments:  Abnormal gait, Decreased activity tolerance, Decreased balance, Decreased endurance, Decreased mobility, Decreased range of motion, Decreased strength, Difficulty walking, Hypomobility, Increased edema, Increased fascial restricitons, Increased muscle spasms, Impaired flexibility, Improper body mechanics, Postural dysfunction, Pain, Decreased safety awareness  Visit Diagnosis: Pain in right ankle and joints of right foot  Pain in left ankle and joints of left foot  Localized edema  Difficulty in walking, not elsewhere classified     Problem List Patient Active Problem List   Diagnosis Date Noted  . Complex regional pain syndrome type 1 of right lower extremity 10/22/2018  . Small bowel obstruction due to adhesions  ([place]) 06/22/2016  . GERD 08/31/2009  . DYSPHAGIA UNSPECIFIED 08/31/2009   Clarene Critchley PT, DPT 2:40 PM, 11/01/18 [place], [place], [address] Phone: [phone]   Fax:  [phone]  Name: Rebecca Osborne MRN: [id number] Date of Birth: May 01, 1951

## 2018-11-05 ENCOUNTER — Encounter (HOSPITAL_COMMUNITY): Payer: Self-pay | Admitting: Physical Therapy

## 2018-11-05 ENCOUNTER — Ambulatory Visit (HOSPITAL_COMMUNITY): Payer: Medicare Other | Attending: Podiatry | Admitting: Physical Therapy

## 2018-11-05 ENCOUNTER — Other Ambulatory Visit: Payer: Self-pay

## 2018-11-05 DIAGNOSIS — R262 Difficulty in walking, not elsewhere classified: Secondary | ICD-10-CM | POA: Diagnosis not present

## 2018-11-05 DIAGNOSIS — R600 Localized edema: Secondary | ICD-10-CM | POA: Diagnosis not present

## 2018-11-05 DIAGNOSIS — M25571 Pain in right ankle and joints of right foot: Secondary | ICD-10-CM | POA: Diagnosis not present

## 2018-11-05 DIAGNOSIS — M25572 Pain in left ankle and joints of left foot: Secondary | ICD-10-CM | POA: Diagnosis not present

## 2018-11-05 NOTE — Therapy (Signed)
[place], [place], [address] Phone: [phone]   Fax:  [phone]  Physical Therapy Treatment  Patient Details  Name: Rebecca Osborne MRN: [id number] Date of Birth: 01-13-1951 Referring Provider (PT): Trula Slade, DPM   Encounter Date: 11/05/2018  PT End of Session - 11/05/18 1050    Visit Number  5    Number of Visits  12    Date for PT Re-Evaluation  11/22/18    Authorization Type  [employer] Medicare    Authorization Time Period  10/11/18 to 11/01/18; 11/01/18 - 11/23/18    Authorization - Visit Number  2    Authorization - Number of Visits  10    PT Start Time  1036    PT Stop Time  1114    PT Time Calculation (min)  38 min    Activity Tolerance  Patient limited by pain;Patient tolerated treatment well    Behavior During Therapy  [facility] for tasks assessed/performed       Past Medical History:  Diagnosis Date  . Allergy   . Cancer (HCC)    cervical cancer cells  . GERD (gastroesophageal reflux disease)   . Osteopenia     Past Surgical History:  Procedure Laterality Date  . ABDOMINAL HYSTERECTOMY    . BOWEL RESECTION N/A 06/22/2016   Procedure: SMALL BOWEL RESECTION;  Surgeon: Vickie Epley, MD;  Location: [facility];  Service: General;  Laterality: N/A;  . CHOLECYSTECTOMY  2005  . FRACTURE SURGERY     rt leg  . LAPAROTOMY N/A 06/22/2016   Procedure: EXPLORATORY LAPAROTOMY;  Surgeon: Vickie Epley, MD;  Location: [facility];  Service: General;  Laterality: N/A;  . LYSIS OF ADHESION N/A 06/22/2016   Procedure: LYSIS OF ADHESION;  Surgeon: Vickie Epley, MD;  Location: [facility];  Service: General;  Laterality: N/A;    There were no vitals filed for this visit.  Subjective Assessment - 11/05/18 1042    Subjective  Patient reported that she is having 7/10 pain in both feet more on the inside.     Limitations  Lifting;Standing;Walking;House hold activities    How long can you sit comfortably?  no issues     How long can you stand comfortably?  maybe 10 mins    How long can you walk comfortably?  8 hours if take a pain pill, can't hardly at all if she doesn't take one    Patient Stated Goals  get it to where she can walk on it    Currently in Pain?  Yes    Pain Score  7     Pain Location  Foot    Pain Orientation  Right;Left    Pain Descriptors / Indicators  Aching    Pain Onset  More than a month ago                       [facility] Adult PT Treatment/Exercise - 11/05/18 0001      Manual Therapy   Manual Therapy  Edema management;Joint mobilization    Manual therapy comments  performed seperate form other interventions    Edema Management  retrograde massage to bil ankles/feet in elevated position    Joint Mobilization  AP glide to talcrural joint on bil ankles; 2x 30 seconds grade III oscillations      Ankle Exercises: Seated   Towel Inversion/Eversion  5 reps    Towel Inversion/Eversion Limitations  Bilateral LE cues to reduce compensations    BAPS  Sitting;Level 2   DF/PF bil LE x10, CW/CCW x 5              PT Short Term Goals - 11/01/18 1427      PT SHORT TERM GOAL #1   Title  Pt will be independent with HEP and report compliance with completing as instructed to facilitate reduced pain, improved QOL, and self-management of symptoms.    Time  1    Period  Weeks    Status  Achieved    Target Date  10/18/18      PT SHORT TERM GOAL #2   Title  Pt will have improved bil DF by 3 deg and bil PF by 5 deg or > to facilitate reduced pain, improved gait, and stair ambulation.     Time  1    Period  Weeks    Status  On-going      PT SHORT TERM GOAL #3   Title  Pt will report being able to perform WB activities for 15 mins with 4/10 bil foot pain in order to allow her to perform [facility] chores with greater ease.     Time  1    Period  Weeks    Status  Achieved      PT SHORT TERM GOAL #4   Title  Pt will be able tO perform bil SLS for 6 sec or > to demo improved  functional strength of bil ankles and maximize gait and stairs.     Time  1    Period  Weeks    Status  On-going        PT Long Term Goals - 11/01/18 1431      PT LONG TERM GOAL #1   Title  Pt will have reduced circumferential edema at forefoot by 0.25" to facilitate reduced pain and to maximize gait and general tolerance to WB.     Baseline  11/01/18: See objective measures    Time  3    Period  Weeks    Status  Partially Met      PT LONG TERM GOAL #2   Title  Pt will report being able to perform WB activities for 25 mins or > with 3/10 bil foot pain in order to allow her to prepare a small meal for herself and perform self-hygiene tasks with greater ease.     Baseline  11/01/18: Patient reported ability to stand and walk for at least 30 minutes    Time  3    Period  Weeks    Status  Achieved      PT LONG TERM GOAL #3   Title  Pt will have 126f improvement during 3MWT with 4/10 bil foot pain and gait mechanics improved in order to maximize community access and ambulation.    Baseline  11/01/18: 492 feet this date    Time  3    Period  Weeks    Status  Achieved      PT LONG TERM GOAL #4   Title  Pt will have 1/2 grade improvement in MMT throughout all mm groups tested in order to reduce pain and improve functional tasks.    Baseline  11/01/18: See MMT    Time  3    Period  Weeks    Status  On-going            Plan - 11/05/18 1126  Clinical Impression Statement  Continued with a  focus on ROM exercises. Resumed inversion/eversion exercises with a towel with cues to focus movement at the ankle. Also resumed edema management and gentle joint mobilizations. Plan to progress to weightbearing exercises next session as able. Patient would continue to benefit from skilled physical therapy in order to continue progressing towards functional goals.     Personal Factors and Comorbidities  Age;Comorbidity 2;Finances;Past/Current Experience;Profession;Time since onset of  injury/illness/exacerbation    Comorbidities  see above    Examination-Activity Limitations  Carry;Lift;Locomotion Level;Squat;Stairs;Stand    Examination-Participation Restrictions  Community Activity;Laundry;Meal Prep;Yard Work;Shop    Stability/Clinical Decision Making  Evolving/Moderate complexity    Rehab Potential  Fair    PT Frequency  2x / week    PT Duration  3 weeks    PT Treatment/Interventions  ADLs/Self Care Home Management;Aquatic Therapy;Cryotherapy;Electrical Stimulation;Moist Heat;Traction;Ultrasound;DME Instruction;Gait training;Stair training;Functional mobility training;Therapeutic activities;Therapeutic exercise;Balance training;Neuromuscular re-education;Patient/family education;Orthotic Fit/Training;Manual techniques;Manual lymph drainage;Passive range of motion;Dry needling;Energy conservation;Taping;Splinting;Joint Manipulations    PT Next Visit Plan  Update HEP, begin weight shifting/weight bearing activity. consider trial of Mirror therapy, consider obtaining compression stocking referral for edema; edema control, light mobs/STM for pain control, graded exposure to WB to improve tolerance to WB, continue BAPS    PT Home Exercise Plan  eval: sidelying hip abd, seated ABCs, seated towel inv/ev, seated heel and toe raises    Consulted and Agree with Plan of Care  Patient       Patient will benefit from skilled therapeutic intervention in order to improve the following deficits and impairments:  Abnormal gait, Decreased activity tolerance, Decreased balance, Decreased endurance, Decreased mobility, Decreased range of motion, Decreased strength, Difficulty walking, Hypomobility, Increased edema, Increased fascial restricitons, Increased muscle spasms, Impaired flexibility, Improper body mechanics, Postural dysfunction, Pain, Decreased safety awareness  Visit Diagnosis: Pain in right ankle and joints of right foot  Pain in left ankle and joints of left foot  Localized  edema  Difficulty in walking, not elsewhere classified     Problem List Patient Active Problem List   Diagnosis Date Noted  . Complex regional pain syndrome type 1 of right lower extremity 10/22/2018  . Small bowel obstruction due to adhesions ([place]) 06/22/2016  . GERD 08/31/2009  . DYSPHAGIA UNSPECIFIED 08/31/2009   Clarene Critchley PT, DPT 11:27 AM, 11/05/18 [place], [place], [address] Phone: [phone]   Fax:  [phone]  Name: CLOVIA REINE MRN: [id number] Date of Birth: 03-22-1951

## 2018-11-07 ENCOUNTER — Encounter (HOSPITAL_COMMUNITY): Payer: Self-pay | Admitting: Physical Therapy

## 2018-11-07 ENCOUNTER — Ambulatory Visit (HOSPITAL_COMMUNITY): Payer: Medicare Other | Admitting: Physical Therapy

## 2018-11-07 ENCOUNTER — Other Ambulatory Visit: Payer: Self-pay

## 2018-11-07 DIAGNOSIS — R600 Localized edema: Secondary | ICD-10-CM

## 2018-11-07 DIAGNOSIS — R262 Difficulty in walking, not elsewhere classified: Secondary | ICD-10-CM | POA: Diagnosis not present

## 2018-11-07 DIAGNOSIS — M25572 Pain in left ankle and joints of left foot: Secondary | ICD-10-CM

## 2018-11-07 DIAGNOSIS — M25571 Pain in right ankle and joints of right foot: Secondary | ICD-10-CM

## 2018-11-07 NOTE — Therapy (Signed)
[place], [place], [address] Phone: [phone]   Fax:  [phone]  Physical Therapy Treatment  Patient Details  Name: Rebecca Osborne MRN: [id number] Date of Birth: 1950/07/27 Referring Provider (PT): Trula Slade, DPM   Encounter Date: 11/07/2018  PT End of Session - 11/07/18 1132    Visit Number  6    Number of Visits  12    Date for PT Re-Evaluation  11/22/18    Authorization Type  [employer] Medicare    Authorization Time Period  10/11/18 to 11/01/18; 11/01/18 - 11/23/18    Authorization - Visit Number  3    Authorization - Number of Visits  10    PT Start Time  6415    PT Stop Time  1115    PT Time Calculation (min)  42 min    Activity Tolerance  Patient limited by pain;Patient tolerated treatment well    Behavior During Therapy  [facility] for tasks assessed/performed       Past Medical History:  Diagnosis Date  . Allergy   . Cancer (HCC)    cervical cancer cells  . GERD (gastroesophageal reflux disease)   . Osteopenia     Past Surgical History:  Procedure Laterality Date  . ABDOMINAL HYSTERECTOMY    . BOWEL RESECTION N/A 06/22/2016   Procedure: SMALL BOWEL RESECTION;  Surgeon: Vickie Epley, MD;  Location: [facility];  Service: General;  Laterality: N/A;  . CHOLECYSTECTOMY  2005  . FRACTURE SURGERY     rt leg  . LAPAROTOMY N/A 06/22/2016   Procedure: EXPLORATORY LAPAROTOMY;  Surgeon: Vickie Epley, MD;  Location: [facility];  Service: General;  Laterality: N/A;  . LYSIS OF ADHESION N/A 06/22/2016   Procedure: LYSIS OF ADHESION;  Surgeon: Vickie Epley, MD;  Location: [facility];  Service: General;  Laterality: N/A;    There were no vitals filed for this visit.  Subjective Assessment - 11/07/18 1130    Subjective  Patient reporting 9/10 pain in bilateral feet at beginning of session. Patient reported 7/10 pain following manual therapy at end of sesison.     Limitations  Lifting;Standing;Walking;House hold  activities    How long can you sit comfortably?  no issues    How long can you stand comfortably?  maybe 10 mins    How long can you walk comfortably?  8 hours if take a pain pill, can't hardly at all if she doesn't take one    Patient Stated Goals  get it to where she can walk on it    Currently in Pain?  Yes    Pain Score  9     Pain Location  Foot    Pain Orientation  Right;Left    Pain Descriptors / Indicators  Aching    Pain Type  Chronic pain    Pain Onset  More than a month ago                       [facility] Adult PT Treatment/Exercise - 11/07/18 0001      Manual Therapy   Manual Therapy  Edema management;Soft tissue mobilization    Manual therapy comments  performed seperate form other interventions    Edema Management  retrograde massage to bil ankles/feet in elevated position    Soft tissue mobilization  To soles of feet to reduce pain and restrictions      Ankle Exercises: Seated  Towel Inversion/Eversion  5 reps    Towel Inversion/Eversion Limitations  Bilateral LE cues to reduce compensations    Other Seated Ankle Exercises  Rockerboard: Inversion/Eversion AROM; 15 reps bil LE      Ankle Exercises: Standing   Other Standing Ankle Exercises  Weight shift anterior/posterior and right/left x 30 on firm surface and on foam with upper extremity support    Other Standing Ankle Exercises  Hip extension and abduction 2 x 10 with 2# ankle weights with verbal cues for proper form and technique             PT Education - 11/07/18 1131    Education Details  Educated on [employer].     Person(s) Educated  Patient    Methods  Explanation;Handout    Comprehension  Verbalized understanding       PT Short Term Goals - 11/01/18 1427      PT SHORT TERM GOAL #1   Title  Pt will be independent with HEP and report compliance with completing as instructed to facilitate reduced pain, improved QOL, and self-management of symptoms.    Time  1    Period  Weeks     Status  Achieved    Target Date  10/18/18      PT SHORT TERM GOAL #2   Title  Pt will have improved bil DF by 3 deg and bil PF by 5 deg or > to facilitate reduced pain, improved gait, and stair ambulation.     Time  1    Period  Weeks    Status  On-going      PT SHORT TERM GOAL #3   Title  Pt will report being able to perform WB activities for 15 mins with 4/10 bil foot pain in order to allow her to perform [facility] chores with greater ease.     Time  1    Period  Weeks    Status  Achieved      PT SHORT TERM GOAL #4   Title  Pt will be able tO perform bil SLS for 6 sec or > to demo improved functional strength of bil ankles and maximize gait and stairs.     Time  1    Period  Weeks    Status  On-going        PT Long Term Goals - 11/01/18 1431      PT LONG TERM GOAL #1   Title  Pt will have reduced circumferential edema at forefoot by 0.25" to facilitate reduced pain and to maximize gait and general tolerance to WB.     Baseline  11/01/18: See objective measures    Time  3    Period  Weeks    Status  Partially Met      PT LONG TERM GOAL #2   Title  Pt will report being able to perform WB activities for 25 mins or > with 3/10 bil foot pain in order to allow her to prepare a small meal for herself and perform self-hygiene tasks with greater ease.     Baseline  11/01/18: Patient reported ability to stand and walk for at least 30 minutes    Time  3    Period  Weeks    Status  Achieved      PT LONG TERM GOAL #3   Title  Pt will have 164f improvement during 3MWT with 4/10 bil foot pain and gait mechanics improved in order to maximize community  access and ambulation.    Baseline  11/01/18: 492 feet this date    Time  3    Period  Weeks    Status  Achieved      PT LONG TERM GOAL #4   Title  Pt will have 1/2 grade improvement in MMT throughout all mm groups tested in order to reduce pain and improve functional tasks.    Baseline  11/01/18: See MMT    Time  3    Period  Weeks     Status  On-going            Plan - 11/07/18 1140    Clinical Impression Statement  Progressed to more weightbearing exercises this session. Added standing weight shift to the right/left and anterior/posterior on compliant and on non-compliant surface. Also added hip abduction and hip extension exercises in standing to improve hip strength in order to increase patient's hip strength and decrease forces through feet/ankles. Ended session with manual therapy including soft tissue mobilization to decrease pain and edema management. Following these patient report a reduction in pain from 9/10 to 7/10.     Personal Factors and Comorbidities  Age;Comorbidity 2;Finances;Past/Current Experience;Profession;Time since onset of injury/illness/exacerbation    Comorbidities  see above    Examination-Activity Limitations  Carry;Lift;Locomotion Level;Squat;Stairs;Stand    Examination-Participation Restrictions  Community Activity;Laundry;Meal Prep;Yard Work;Shop    Stability/Clinical Decision Making  Evolving/Moderate complexity    Rehab Potential  Fair    PT Frequency  2x / week    PT Duration  3 weeks    PT Treatment/Interventions  ADLs/Self Care Home Management;Aquatic Therapy;Cryotherapy;Electrical Stimulation;Moist Heat;Traction;Ultrasound;DME Instruction;Gait training;Stair training;Functional mobility training;Therapeutic activities;Therapeutic exercise;Balance training;Neuromuscular re-education;Patient/family education;Orthotic Fit/Training;Manual techniques;Manual lymph drainage;Passive range of motion;Dry needling;Energy conservation;Taping;Splinting;Joint Manipulations    PT Next Visit Plan  Continue progression of weightbearing, functional strengthening - possibly step ups next session. edema control, light mobs/STM for pain control, graded exposure to WB to improve tolerance to WB, continue BAPS    PT Home Exercise Plan  eval: sidelying hip abd, seated ABCs, seated towel inv/ev, seated heel and  toe raises    Consulted and Agree with Plan of Care  Patient       Patient will benefit from skilled therapeutic intervention in order to improve the following deficits and impairments:  Abnormal gait, Decreased activity tolerance, Decreased balance, Decreased endurance, Decreased mobility, Decreased range of motion, Decreased strength, Difficulty walking, Hypomobility, Increased edema, Increased fascial restricitons, Increased muscle spasms, Impaired flexibility, Improper body mechanics, Postural dysfunction, Pain, Decreased safety awareness  Visit Diagnosis: Pain in right ankle and joints of right foot  Pain in left ankle and joints of left foot  Localized edema  Difficulty in walking, not elsewhere classified     Problem List Patient Active Problem List   Diagnosis Date Noted  . Complex regional pain syndrome type 1 of right lower extremity 10/22/2018  . Small bowel obstruction due to adhesions ([place]) 06/22/2016  . GERD 08/31/2009  . DYSPHAGIA UNSPECIFIED 08/31/2009   Clarene Critchley PT, DPT 11:43 AM, 11/07/18 [place], [place], [address] Phone: [phone]   Fax:  [phone]  Name: Rebecca Osborne MRN: [id number] Date of Birth: 1951-01-23

## 2018-11-07 NOTE — Patient Instructions (Signed)
PRE GAIT: Standing Weight Shift    Stand with upright posture, shift weight from side to side. __30_ reps per set, __1_ sets per day, _7__ days per week. Hold onto a support - a countertop. Repeat shifting weight forward and back.   Copyright  VHI. All rights reserved.

## 2018-11-13 ENCOUNTER — Ambulatory Visit (HOSPITAL_COMMUNITY): Payer: Medicare Other | Admitting: Physical Therapy

## 2018-11-13 ENCOUNTER — Other Ambulatory Visit: Payer: Self-pay

## 2018-11-13 DIAGNOSIS — M25571 Pain in right ankle and joints of right foot: Secondary | ICD-10-CM | POA: Diagnosis not present

## 2018-11-13 DIAGNOSIS — R262 Difficulty in walking, not elsewhere classified: Secondary | ICD-10-CM | POA: Diagnosis not present

## 2018-11-13 DIAGNOSIS — M25572 Pain in left ankle and joints of left foot: Secondary | ICD-10-CM

## 2018-11-13 DIAGNOSIS — R600 Localized edema: Secondary | ICD-10-CM | POA: Diagnosis not present

## 2018-11-13 NOTE — Therapy (Signed)
[place], [place], [address] Phone: [phone]   Fax:  [phone]  Physical Therapy Treatment  Patient Details  Name: Rebecca Osborne MRN: [id number] Date of Birth: [birth year] Referring Provider (PT): Trula Slade, DPM   Encounter Date: 11/13/2018  PT End of Session - 11/13/18 1610    Visit Number  7    Number of Visits  12    Date for PT Re-Evaluation  11/22/18    Authorization Type  [employer] Medicare    Authorization Time Period  10/11/18 to 11/01/18; 11/01/18 - 11/23/18    Authorization - Visit Number  4    Authorization - Number of Visits  10    PT Start Time  1030    PT Stop Time  1115    PT Time Calculation (min)  45 min    Activity Tolerance  Patient limited by pain;Patient tolerated treatment well    Behavior During Therapy  [facility] for tasks assessed/performed       Past Medical History:  Diagnosis Date  . Allergy   . Cancer (HCC)    cervical cancer cells  . GERD (gastroesophageal reflux disease)   . Osteopenia     Past Surgical History:  Procedure Laterality Date  . ABDOMINAL HYSTERECTOMY    . BOWEL RESECTION N/A 06/22/2016   Procedure: SMALL BOWEL RESECTION;  Surgeon: Vickie Epley, MD;  Location: [facility];  Service: General;  Laterality: N/A;  . CHOLECYSTECTOMY  2005  . FRACTURE SURGERY     rt leg  . LAPAROTOMY N/A 06/22/2016   Procedure: EXPLORATORY LAPAROTOMY;  Surgeon: Vickie Epley, MD;  Location: [facility];  Service: General;  Laterality: N/A;  . LYSIS OF ADHESION N/A 06/22/2016   Procedure: LYSIS OF ADHESION;  Surgeon: Vickie Epley, MD;  Location: [facility];  Service: General;  Laterality: N/A;    There were no vitals filed for this visit.  Subjective Assessment - 11/13/18 1605    Subjective  pt states her pain is 9/10 today.  comes agian today wearing her surgical shoe on Rt side and walking with antalgia.    Currently in Pain?  Yes    Pain Score  9     Pain Location  Foot    Pain  Orientation  Right;Left    Pain Descriptors / Indicators  Aching    Pain Type  Chronic pain    Pain Onset  More than a month ago                       [facility] Adult PT Treatment/Exercise - 11/13/18 0001      Manual Therapy   Manual Therapy  Edema management;Joint mobilization    Manual therapy comments  performed seperate form other interventions    Edema Management  retrograde massage Lt ankles and Rt forefoot in elevated position    Joint Mobilization  Rt great toe      Ankle Exercises: Standing   BAPS  Standing;Level 2;10 reps;Limitations    BAPS Limitations  A/P and laterals only, no rotations    Rocker Board  Other (comment)   10 reps each in bars with UE asssit   Other Standing Ankle Exercises  Hip extension and abduction 2 x 10 with 2# ankle weights with verbal cues for proper form and technique      Ankle Exercises: Seated   Towel Inversion/Eversion  5 reps    Towel Inversion/Eversion Limitations  Bilateral LE cues to reduce compensations             PT Education - 11/13/18 1619    Education Details  importance of completing HEP (did not appear to know how to complete) and encouraged to acquire new tennis shoes with larger toe box.      Person(s) Educated  Patient    Methods  Explanation    Comprehension  Verbalized understanding       PT Short Term Goals - 11/01/18 1427      PT SHORT TERM GOAL #1   Title  Pt will be independent with HEP and report compliance with completing as instructed to facilitate reduced pain, improved QOL, and self-management of symptoms.    Time  1    Period  Weeks    Status  Achieved    Target Date  10/18/18      PT SHORT TERM GOAL #2   Title  Pt will have improved bil DF by 3 deg and bil PF by 5 deg or > to facilitate reduced pain, improved gait, and stair ambulation.     Time  1    Period  Weeks    Status  On-going      PT SHORT TERM GOAL #3   Title  Pt will report being able to perform WB activities for 15  mins with 4/10 bil foot pain in order to allow her to perform [facility] chores with greater ease.     Time  1    Period  Weeks    Status  Achieved      PT SHORT TERM GOAL #4   Title  Pt will be able tO perform bil SLS for 6 sec or > to demo improved functional strength of bil ankles and maximize gait and stairs.     Time  1    Period  Weeks    Status  On-going        PT Long Term Goals - 11/01/18 1431      PT LONG TERM GOAL #1   Title  Pt will have reduced circumferential edema at forefoot by 0.25" to facilitate reduced pain and to maximize gait and general tolerance to WB.     Baseline  11/01/18: See objective measures    Time  3    Period  Weeks    Status  Partially Met      PT LONG TERM GOAL #2   Title  Pt will report being able to perform WB activities for 25 mins or > with 3/10 bil foot pain in order to allow her to prepare a small meal for herself and perform self-hygiene tasks with greater ease.     Baseline  11/01/18: Patient reported ability to stand and walk for at least 30 minutes    Time  3    Period  Weeks    Status  Achieved      PT LONG TERM GOAL #3   Title  Pt will have 173f improvement during 3MWT with 4/10 bil foot pain and gait mechanics improved in order to maximize community access and ambulation.    Baseline  11/01/18: 492 feet this date    Time  3    Period  Weeks    Status  Achieved      PT LONG TERM GOAL #4   Title  Pt will have 1/2 grade improvement in MMT throughout all mm groups tested in order to reduce pain and improve functional  tasks.    Baseline  11/01/18: See MMT    Time  3    Period  Weeks    Status  On-going            Plan - 11/13/18 1614    Clinical Impression Statement  progressed to BAPS and rockerboard in standing this session.  Pt with pain behaviors, however able to complete with tactile cues for form.  Encouraged to go shoe shopping as she would benefit from wearing tennis shoes on bilateral LE's (still wearing surgical shoe on Rt)  to normalize her gait and help decrease her great toe extension.  Pt verbalized understanding.  Manual completed to Rt great toe, plantar surface of Rt foot and Lt ankle.  Pt with painful behaviors with manual.  Good mobility and no pain expressed completing towel inversion/eversion with either foot.      PT Next Visit Plan  continue to increase WB exercises.  Next session begin step ups and work on heel to to gait.  Continue manual as needed.  Follow up on acquiring shoes with larger toe box.         Patient will benefit from skilled therapeutic intervention in order to improve the following deficits and impairments:     Visit Diagnosis: Pain in right ankle and joints of right foot  Pain in left ankle and joints of left foot  Localized edema     Problem List Patient Active Problem List   Diagnosis Date Noted  . Complex regional pain syndrome type 1 of right lower extremity 10/22/2018  . Small bowel obstruction due to adhesions ([place]) 06/22/2016  . GERD 08/31/2009  . DYSPHAGIA UNSPECIFIED 08/31/2009   Teena Irani, PTA/CLT [phone]  Teena Irani 11/13/2018, 4:20 PM  [place], [place], [address] Phone: [phone]   Fax:  [phone]  Name: Rebecca Osborne MRN: [id number] Date of Birth: 1951-02-15

## 2018-11-14 ENCOUNTER — Ambulatory Visit (INDEPENDENT_AMBULATORY_CARE_PROVIDER_SITE_OTHER): Payer: Medicare Other | Admitting: Family Medicine

## 2018-11-14 VITALS — BP 110/74 | HR 88 | Temp 98.0°F | Resp 18 | Ht 61.0 in | Wt 110.0 lb

## 2018-11-14 DIAGNOSIS — R222 Localized swelling, mass and lump, trunk: Secondary | ICD-10-CM

## 2018-11-14 NOTE — Progress Notes (Signed)
Patient ID: Rebecca Osborne, female    DOB: 1950/07/04, 68 y.o.   MRN: [id number]  PCP: Susy Frizzle, MD  Chief Complaint  Patient presents with  . Mass    ribcage area, has gotten big in last 2 months    Subjective:   Rebecca Osborne is a 68 y.o. female, presents to clinic with CC of growing "mass" on torso.  It was previously looked at by ER in Jan 2020 and her PCP March 2020 with unremarkable xrays and then CT Abd/pelvis.  She points to her lower anterior ribs saying that mass was on the left and is getting larger and she was told her needs surgery and she is here because she just wants surgery before she is too deformed.  She also reports other areas having masses and getting larger for the past two months.  She has not pain, skin changes, CP, SOB, abd pain, N, V, D, wheeze.   When asked if she has consulted with a surgeon she just continues to state that she went to the hospital for a CT scan and she was told she needs surgery.      Patient Active Problem List   Diagnosis Date Noted  . Complex regional pain syndrome type 1 of right lower extremity 10/22/2018  . Small bowel obstruction due to adhesions ([place]) 06/22/2016  . GERD 08/31/2009  . DYSPHAGIA UNSPECIFIED 08/31/2009     Prior to Admission medications   Medication Sig Start Date End Date Taking? Authorizing Provider  acetaminophen (TYLENOL) 325 MG tablet Take 650 mg by mouth every 6 (six) hours as needed.   Yes [provider]  DULoxetine (CYMBALTA) 30 MG capsule Take 1 capsule (30 mg total) by mouth daily. 10/22/18  Yes Jamse Arn, MD  ibuprofen (ADVIL) 600 MG tablet TAKE 1 TABLET BY MOUTH EVERY 8 HOURS AS NEEDED. 10/16/18  Yes Trula Slade, DPM  lidocaine (XYLOCAINE) 5 % ointment Apply 1 application topically as needed. 10/22/18  Yes Jamse Arn, MD  gabapentin (NEURONTIN) 100 MG capsule Take 2 capsules (200 mg total) by mouth 3 (three) times daily. Patient not taking: Reported on 11/14/2018  08/27/18   Trula Slade, DPM     Allergies  Allergen Reactions  . Codeine Itching     Family History  Problem Relation Age of Onset  . COPD Mother   . Heart disease Mother   . Miscarriages / [place] Mother   . Vision loss Mother        from shingles  . COPD Father   . Cancer Brother        leukemia  . Early death Brother      Social History   Socioeconomic History  . Marital status: Widowed    Spouse name: Not on file  . Number of children: Not on file  . Years of education: Not on file  . Highest education level: Not on file  Occupational History  . Not on file  Social Needs  . Financial resource strain: Not on file  . Food insecurity:    Worry: Not on file    Inability: Not on file  . Transportation needs:    Medical: Not on file    Non-medical: Not on file  Tobacco Use  . Smoking status: Never Smoker  . Smokeless tobacco: Never Used  Substance and Sexual Activity  . Alcohol use: No  . Drug use: No  . Sexual activity: Not Currently  Lifestyle  . Physical activity:    Days per week: Not on file    Minutes per session: Not on file  . Stress: Not on file  Relationships  . Social connections:    Talks on phone: Not on file    Gets together: Not on file    Attends religious service: Not on file    Active member of club or organization: Not on file    Attends meetings of clubs or organizations: Not on file    Relationship status: Not on file  . Intimate partner violence:    Fear of current or ex partner: Not on file    Emotionally abused: Not on file    Physically abused: Not on file    Forced sexual activity: Not on file  Other Topics Concern  . Not on file  Social History Narrative  . Not on file     Review of Systems  Constitutional: Negative.   HENT: Negative.   Eyes: Negative.   Respiratory: Negative.   Cardiovascular: Negative.   Gastrointestinal: Negative.   Endocrine: Negative.   Genitourinary: Negative.    Musculoskeletal: Negative.   Skin: Negative.   Allergic/Immunologic: Negative.   Neurological: Negative.   Hematological: Negative.   Psychiatric/Behavioral: Negative.   All other systems reviewed and are negative.      Objective:    Vitals:   11/14/18 1507  BP: 110/74  Pulse: 88  Resp: 18  Temp: 98 F (36.7 C)  SpO2: 96%  Weight: 110 lb (49.9 kg)  Height: 5' 1"  (1.549 m)      Physical Exam Vitals signs and nursing note reviewed.  Constitutional:      General: She is not in acute distress.    Appearance: She is well-developed. She is not ill-appearing, toxic-appearing or diaphoretic.     Comments: Thin elderly female, nervous but well appearing  HENT:     Head: Normocephalic and atraumatic.     Right Ear: External ear normal.     Left Ear: External ear normal.     Nose: Nose normal. No congestion.     Mouth/Throat:     Mouth: Mucous membranes are moist.     Pharynx: Oropharynx is clear.  Eyes:     General: No scleral icterus.       Right eye: No discharge.        Left eye: No discharge.     Conjunctiva/sclera: Conjunctivae normal.  Neck:     Trachea: No tracheal deviation.  Cardiovascular:     Rate and Rhythm: Normal rate and regular rhythm.     Pulses: Normal pulses.     Heart sounds: No murmur. No friction rub. No gallop.   Pulmonary:     Effort: Pulmonary effort is normal. No respiratory distress.     Breath sounds: Normal breath sounds. No stridor. No wheezing, rhonchi or rales.  Chest:     Chest wall: No mass, swelling, tenderness, crepitus or edema. There is no dullness to percussion.       Comments: Area circled ribs appear more prominent than on right, no appreciable abnormality with palpation on exam.  No edema, discreet mass or nodule papated No ttp Abdominal:     General: Abdomen is flat. Bowel sounds are normal. There is no distension.     Palpations: There is no mass.     Tenderness: There is no abdominal tenderness. There is no right CVA  tenderness, left CVA tenderness, guarding or rebound.  Hernia: No hernia is present.  Musculoskeletal: Normal range of motion.  Skin:    General: Skin is warm and dry.     Coloration: Skin is not jaundiced or pale.     Findings: No ecchymosis, erythema, lesion or rash.  Neurological:     Mental Status: She is alert.     Motor: No abnormal muscle tone.     Coordination: Coordination normal.  Psychiatric:        Behavior: Behavior normal.           Assessment & Plan:      ICD-10-CM   1. Mass of left chest wall R22.2 DG Ribs Unilateral Left    There is a subtle asymmetry to the left anterior lower ribs, more prominent than the right, pt is pointing at it, and pressing on it on the L and the right and saying "can't you feel it?" and stating it has gotten bigger in size.  I cannot appreciate any discreet mass or nodule.  Pt is extremely concerned.  I have asked if she saw a surgeon?  She isn't sure, keeps saying she got a CT done and that she needs surgery.  I reviewed her CT results and explained they were normal and further explained that I do not feel any masses.  The bones are prominent so we can recheck a rib xray to see if there are changes since Jan this year.  I asked what she came here for and she said so that she can have surgery, again I asked if shes seen a Psychologist, sport and exercise.    I offered Xray and ordered it, when leaving I could hear her ask the front desk if she could do it later.  I feel she would be reassured if she saw here PCP again and together they reassessed the size, since He would appreciate a change from prior exams, and I cannot and with todays presentation and exam, I do not feel that there is anything else for me to do, or that's indicated for me to do.  Also pt is new to me, she appears anxious, there are visits in chart every few days and I wonder if she has some hx of anxiety/excessive worry re dx/conditions etc.  On the other had, she does have some hx of cancer,  that is one of her concerns, and it appears she has lost weight over the past several months.  She denies CP, SOB, weakness.   Wt Readings from Last 5 Encounters:  11/14/18 110 lb (49.9 kg)  10/22/18 114 lb (51.7 kg)  07/23/18 121 lb 12.8 oz (55.2 kg)  06/25/18 126 lb (57.2 kg)  06/15/18 125 lb (56.7 kg)   Hopefully rib xray will appreciate anything pertinent and the pt and her PCP can determine together if another CT or other imaging modaliy is appropriate.  At this time I do not feel like CT is indicated, but the pt is very upset and I would like for her to be rechecked by her PCP who knows her and who has addressed this before, because I do not feel there is anything else appropriate for me to order at this time.    Delsa Grana, PA-C 11/14/18 3:45 PM

## 2018-11-15 ENCOUNTER — Encounter (HOSPITAL_COMMUNITY): Payer: Self-pay

## 2018-11-15 ENCOUNTER — Other Ambulatory Visit: Payer: Self-pay

## 2018-11-15 ENCOUNTER — Encounter: Payer: Self-pay | Admitting: Family Medicine

## 2018-11-15 ENCOUNTER — Other Ambulatory Visit: Payer: Self-pay | Admitting: Podiatry

## 2018-11-15 ENCOUNTER — Other Ambulatory Visit: Payer: Self-pay | Admitting: Physical Medicine & Rehabilitation

## 2018-11-15 ENCOUNTER — Ambulatory Visit (HOSPITAL_COMMUNITY): Payer: Medicare Other

## 2018-11-15 ENCOUNTER — Ambulatory Visit (HOSPITAL_COMMUNITY)
Admission: RE | Admit: 2018-11-15 | Discharge: 2018-11-15 | Disposition: A | Discharge status: Home / Self Care | Payer: Medicare Other | Source: Ambulatory Visit | Attending: Family Medicine | Admitting: Family Medicine

## 2018-11-15 DIAGNOSIS — M25572 Pain in left ankle and joints of left foot: Secondary | ICD-10-CM

## 2018-11-15 DIAGNOSIS — R600 Localized edema: Secondary | ICD-10-CM | POA: Diagnosis not present

## 2018-11-15 DIAGNOSIS — R0781 Pleurodynia: Secondary | ICD-10-CM | POA: Diagnosis not present

## 2018-11-15 DIAGNOSIS — R222 Localized swelling, mass and lump, trunk: Secondary | ICD-10-CM | POA: Insufficient documentation

## 2018-11-15 DIAGNOSIS — R262 Difficulty in walking, not elsewhere classified: Secondary | ICD-10-CM | POA: Diagnosis not present

## 2018-11-15 DIAGNOSIS — M25571 Pain in right ankle and joints of right foot: Secondary | ICD-10-CM

## 2018-11-15 NOTE — Therapy (Signed)
Rebecca Osborne, [place], [address] Phone: [phone]   Fax:  [phone]  Physical Therapy Treatment  Patient Details  Name: Rebecca Osborne MRN: [id number] Date of Birth: Oct 11, 1950 Referring Provider (PT): Trula Slade, DPM   Encounter Date: 11/15/2018  PT End of Session - 11/15/18 1112    Visit Number  8    Number of Visits  12    Date for PT Re-Evaluation  11/22/18    Authorization Type  [employer] Medicare    Authorization Time Period  10/11/18 to 11/01/18; 11/01/18 - 11/23/18    Authorization - Visit Number  5    Authorization - Number of Visits  10    PT Start Time  5465    PT Stop Time  1201    PT Time Calculation (min)  47 min    Activity Tolerance  Patient limited by pain;Patient tolerated treatment well    Behavior During Therapy  [facility] for tasks assessed/performed       Past Medical History:  Diagnosis Date  . Allergy   . Cancer (HCC)    cervical cancer cells  . GERD (gastroesophageal reflux disease)   . Osteopenia     Past Surgical History:  Procedure Laterality Date  . ABDOMINAL HYSTERECTOMY    . BOWEL RESECTION N/A 06/22/2016   Procedure: SMALL BOWEL RESECTION;  Surgeon: Vickie Epley, MD;  Location: [facility];  Service: General;  Laterality: N/A;  . CHOLECYSTECTOMY  2005  . FRACTURE SURGERY     rt leg  . LAPAROTOMY N/A 06/22/2016   Procedure: EXPLORATORY LAPAROTOMY;  Surgeon: Vickie Epley, MD;  Location: [facility];  Service: General;  Laterality: N/A;  . LYSIS OF ADHESION N/A 06/22/2016   Procedure: LYSIS OF ADHESION;  Surgeon: Vickie Epley, MD;  Location: [facility];  Service: General;  Laterality: N/A;    There were no vitals filed for this visit.  Subjective Assessment - 11/15/18 1113    Subjective  Pt presents to therpay in tennis shoes this date which she states has helped her pain. Still in 5/10 pain, but much improved from last session (9/10)    Currently in Pain?  Yes    Pain Score  5      Pain Location  Foot    Pain Orientation  Right;Left    Pain Descriptors / Indicators  Aching    Pain Type  Chronic pain    Pain Onset  More than a month ago    Pain Frequency  Constant    Aggravating Factors   WB    Pain Relieving Factors  rest, elevation    Effect of Pain on Daily Activities  increase             [facility] Adult PT Treatment/Exercise - 11/15/18 0001      Manual Therapy   Manual Therapy  Passive ROM;Edema management;Soft tissue mobilization    Manual therapy comments  performed seperate form other interventions    Edema Management  retrograde massage Lt ankles and Rt forefoot in elevated position    Joint Mobilization  MTP    Soft tissue mobilization  To soles of feet to reduce pain and restrictions    Passive ROM  Rt great toe flexion      Ankle Exercises: Stretches   Slant Board Stretch  3 reps;30 seconds    Other Stretch  bil knee drives on 12" step 6C12" holds each  Ankle Exercises: Standing   BAPS  Standing;Level 2;10 reps;Limitations    BAPS Limitations  R ankle A/P, laterals, and rotations    Rocker Board  Other (comment)   10 reps each in bars with UE asssit   Heel Raises  Both;15 reps    Toe Raise  15 reps    Other Standing Ankle Exercises  9f x2 RT, heel to toe gait    Other Standing Ankle Exercises  bil FWD and lat step ups, 4", 10 reps each      Ankle Exercises: Seated   Towel Crunch  Other (comment)   x10 reps each (cues for form and controlled movement)            PT Education - 11/15/18 1113    Education Details  exercise technique, updatedHEP    Person(s) Educated  Patient    Methods  Explanation;Demonstration;Handout    Comprehension  Verbalized understanding;Returned demonstration       PT Short Term Goals - 11/01/18 1427      PT SHORT TERM GOAL #1   Title  Pt will be independent with HEP and report compliance with completing as instructed to facilitate reduced pain, improved QOL, and self-management of symptoms.     Time  1    Period  Weeks    Status  Achieved    Target Date  10/18/18      PT SHORT TERM GOAL #2   Title  Pt will have improved bil DF by 3 deg and bil PF by 5 deg or > to facilitate reduced pain, improved gait, and stair ambulation.     Time  1    Period  Weeks    Status  On-going      PT SHORT TERM GOAL #3   Title  Pt will report being able to perform WB activities for 15 mins with 4/10 bil foot pain in order to allow her to perform [facility] with greater ease.     Time  1    Period  Weeks    Status  Achieved      PT SHORT TERM GOAL #4   Title  Pt will be able tO perform bil SLS for 6 sec or > to demo improved functional strength of bil ankles and maximize gait and stairs.     Time  1    Period  Weeks    Status  On-going        PT Long Term Goals - 11/01/18 1431      PT LONG TERM GOAL #1   Title  Pt will have reduced circumferential edema at forefoot by 0.25" to facilitate reduced pain and to maximize gait and general tolerance to WB.     Baseline  11/01/18: See objective measures    Time  3    Period  Weeks    Status  Partially Met      PT LONG TERM GOAL #2   Title  Pt will report being able to perform WB activities for 25 mins or > with 3/10 bil foot pain in order to allow her to prepare a small meal for herself and perform self-hygiene tasks with greater ease.     Baseline  11/01/18: Patient reported ability to stand and walk for at least 30 minutes    Time  3    Period  Weeks    Status  Achieved      PT LONG TERM GOAL #3   Title  Pt  will have 166f improvement during 3MWT with 4/10 bil foot pain and gait mechanics improved in order to maximize community access and ambulation.    Baseline  11/01/18: 492 feet this date    Time  3    Period  Weeks    Status  Achieved      PT LONG TERM GOAL #4   Title  Pt will have 1/2 grade improvement in MMT throughout all mm groups tested in order to reduce pain and improve functional tasks.    Baseline  11/01/18: See MMT     Time  3    Period  Weeks    Status  On-going            Plan - 11/15/18 1207    Clinical Impression Statement  Continued with established POC focusing on bil ankle ROM, tolerance to weight bearing, heel to toe gait, and manual to reduce pain and edema. Pt continues to express pain and demo pain behaviors in sessions, but able to complete all therex. Added step ups with good tolerance. Pt slightly unsteady and uncoordinated when working on heel to toe gait. Ended with manual for edema, PROM of great toe flexion, STM for pain control, and began MTP joint mobs. Pt verbalizing pain with light flexion of digits 2-5, however, did not express any pain to Grade III MTP joint mobs. Pt did report she feels therapy has helped thus far, stating her walking is less painful and her swelling has reduced. Pt reporting reduced pain to 2/10 at EOS.    PT Frequency  2x / week    PT Duration  3 weeks    PT Treatment/Interventions  ADLs/Self Care Home Management;Aquatic Therapy;Cryotherapy;Electrical Stimulation;Moist Heat;Traction;Ultrasound;DME Instruction;Gait training;Stair training;Functional mobility training;Therapeutic activities;Therapeutic exercise;Balance training;Neuromuscular re-education;Patient/family education;Orthotic Fit/Training;Manual techniques;Manual lymph drainage;Passive range of motion;Dry needling;Energy conservation;Taping;Splinting;Joint Manipulations    PT Next Visit Plan  continue to increase WB exercises, heel to toe gait, begin balance/stability work next visit. Continue manual as needed    PT Home Exercise Plan  eval: sidelying hip abd, seated ABCs, seated towel inv/ev, seated heel and toe raises; 6/11: towel cruches, bil toe flexion    Consulted and Agree with Plan of Care  Patient       Patient will benefit from skilled therapeutic intervention in order to improve the following deficits and impairments:  Abnormal gait, Decreased activity tolerance, Decreased balance, Decreased  endurance, Decreased mobility, Decreased range of motion, Decreased strength, Difficulty walking, Hypomobility, Increased edema, Increased fascial restricitons, Increased muscle spasms, Impaired flexibility, Improper body mechanics, Postural dysfunction, Pain, Decreased safety awareness  Visit Diagnosis: Pain in right ankle and joints of right foot - Plan:   Pain in left ankle and joints of left foot - Plan:   Localized edema - Plan:   Difficulty in walking, not elsewhere classified - Plan:      Problem List Patient Active Problem List   Diagnosis Date Noted  . Complex regional pain syndrome type 1 of right lower extremity 10/22/2018  . [place] 06[place]22[place]  . GERD 08/31/2009  . DYSPHAGIA UNSPECIFIED 08/31/2009      BGeraldine SolarPT, DPT  [address] [address]Phone: [phone]  Fax:  [phone] Name: PKATREENA SCHUPPMRN: [id number] of Birth: [birth year]

## 2018-11-15 NOTE — Progress Notes (Signed)
Please call pt with results-  Xray is normal, also no change when compared to Xray of chest from January.  If pt is still concerned she should f/up with Dr. Dennard Schaumann.

## 2018-11-16 ENCOUNTER — Ambulatory Visit (INDEPENDENT_AMBULATORY_CARE_PROVIDER_SITE_OTHER): Payer: Medicare Other | Admitting: Family Medicine

## 2018-11-16 ENCOUNTER — Encounter: Payer: Self-pay | Admitting: Family Medicine

## 2018-11-16 VITALS — BP 102/66 | HR 76 | Temp 98.0°F | Resp 12 | Ht 59.0 in | Wt 110.0 lb

## 2018-11-16 DIAGNOSIS — R222 Localized swelling, mass and lump, trunk: Secondary | ICD-10-CM

## 2018-11-16 NOTE — Progress Notes (Signed)
Subjective:    Patient ID: Rebecca Osborne, female    DOB: [birth year], 68 y.o.   MRN: [id number]  HPI  1/20 Patient was recently seen in the emergency room for a mass in her left upper quadrant.  The mass is just below her left ribs under her left breast.  It is tender to palpation.  Medical history is significant for cervical cancer.  She went to the emergency room where a chest x-ray was obtained with no acute abnormalities and she was referred to me to work the area further.  There is no palpable hernia.  The area that she is palpating is just under the ribs on the left side as diagrammed.  It does not have sharp borders.  It is a spongy fat-like mass similar to a lipoma however it is tender to palpation.  It is visually asymmetric compared to the right side.  Patient just noticed this to a few weeks ago.  At that time, my plan was: My best guess is a fatty mass similar to a lipoma just under and adjacent to the ribs making it more pronounced.  Proceed with a CT scan of the abdomen and pelvis to rule out hernia in this area although there is no hernia on Valsalva.  11/16/18 CT revealed no mass.  Recent rib xray was negative.    Patient is convinced that there is a mass below her left breast.  The area that she palpates today is actually her rib cage.  There is a prominent rib protruding from her abdomen just below the level of her left breast.  There is no appreciable circumscribed tumor.  There is no palpable mass. Past Medical History:  Diagnosis Date  . Allergy   . Cancer (HCC)    cervical cancer cells  . GERD (gastroesophageal reflux disease)   . Osteopenia    Past Surgical History:  Procedure Laterality Date  . ABDOMINAL HYSTERECTOMY    . BOWEL RESECTION N/A 06/22/2016   Procedure: SMALL BOWEL RESECTION;  Surgeon: Vickie Epley, MD;  Location: [facility];  Service: General;  Laterality: N/A;  . CHOLECYSTECTOMY  2005  . FRACTURE SURGERY     rt leg  . LAPAROTOMY N/A 06/22/2016   Procedure: EXPLORATORY LAPAROTOMY;  Surgeon: Vickie Epley, MD;  Location: [facility];  Service: General;  Laterality: N/A;  . LYSIS OF ADHESION N/A 06/22/2016   Procedure: LYSIS OF ADHESION;  Surgeon: Vickie Epley, MD;  Location: [facility];  Service: General;  Laterality: N/A;   Current Outpatient Medications on File Prior to Visit  Medication Sig Dispense Refill  . acetaminophen (TYLENOL) 325 MG tablet Take 650 mg by mouth every 6 (six) hours as needed.    . DULoxetine (CYMBALTA) 30 MG capsule Take 1 capsule (30 mg total) by mouth daily. 30 capsule 1  . gabapentin (NEURONTIN) 100 MG capsule Take 2 capsules (200 mg total) by mouth 3 (three) times daily. (Patient not taking: Reported on 11/14/2018) 90 capsule 0  . ibuprofen (ADVIL) 600 MG tablet TAKE 1 TABLET BY MOUTH EVERY 8 HOURS AS NEEDED 30 tablet 0  . lidocaine (XYLOCAINE) 5 % ointment Apply 1 application topically as needed. 35.44 g 1   No current facility-administered medications on file prior to visit.    Allergies  Allergen Reactions  . Codeine Itching   Social History   Socioeconomic History  . Marital status: Widowed    Spouse name: Not on file  . Number of children: Not  on file  . Years of education: Not on file  . Highest education level: Not on file  Occupational History  . Not on file  Social Needs  . Financial resource strain: Not on file  . Food insecurity    Worry: Not on file    Inability: Not on file  . Transportation needs    Medical: Not on file    Non-medical: Not on file  Tobacco Use  . Smoking status: Never Smoker  . Smokeless tobacco: Never Used  Substance and Sexual Activity  . Alcohol use: No  . Drug use: No  . Sexual activity: Not Currently  Lifestyle  . Physical activity    Days per week: Not on file    Minutes per session: Not on file  . Stress: Not on file  Relationships  . Social Herbalist on phone: Not on file    Gets together: Not on file    Attends religious service: Not  on file    Active member of club or organization: Not on file    Attends meetings of clubs or organizations: Not on file    Relationship status: Not on file  . Intimate partner violence    Fear of current or ex partner: Not on file    Emotionally abused: Not on file    Physically abused: Not on file    Forced sexual activity: Not on file  Other Topics Concern  . Not on file  Social History Narrative  . Not on file      Review of Systems  All other systems reviewed and are negative.      Objective:   Physical Exam  Cardiovascular: Normal rate, regular rhythm and normal heart sounds.  Pulmonary/Chest: Effort normal and breath sounds normal.  Abdominal: Soft. Bowel sounds are normal. She exhibits no mass.    Musculoskeletal:        General: No edema.  Vitals reviewed. Area she feels as outlined in red.  It is her rib cage.  There is no soft tissue mass        Assessment & Plan:  Reassured the patient that there is no mass in that area.  Her rib x-rays are normal.  Her CT scan has been normal.  Reassured there is nothing dangerous or pathologic on her exam.

## 2018-11-19 ENCOUNTER — Encounter: Payer: Medicare Other | Admitting: Physical Medicine & Rehabilitation

## 2018-11-20 ENCOUNTER — Other Ambulatory Visit: Payer: Self-pay

## 2018-11-20 ENCOUNTER — Ambulatory Visit (HOSPITAL_COMMUNITY): Payer: Medicare Other | Admitting: Physical Therapy

## 2018-11-20 ENCOUNTER — Encounter (HOSPITAL_COMMUNITY): Payer: Self-pay | Admitting: Physical Therapy

## 2018-11-20 DIAGNOSIS — R262 Difficulty in walking, not elsewhere classified: Secondary | ICD-10-CM

## 2018-11-20 DIAGNOSIS — R600 Localized edema: Secondary | ICD-10-CM | POA: Diagnosis not present

## 2018-11-20 DIAGNOSIS — M25571 Pain in right ankle and joints of right foot: Secondary | ICD-10-CM

## 2018-11-20 DIAGNOSIS — M25572 Pain in left ankle and joints of left foot: Secondary | ICD-10-CM

## 2018-11-20 NOTE — Therapy (Signed)
[place], [place], [address] Phone: [phone]   Fax:  [phone]  Physical Therapy Treatment  Patient Details  Name: Rebecca Osborne MRN: [id number] Date of Birth: Jun 22, 1950 Referring Provider (PT): Trula Slade, DPM   Encounter Date: 11/20/2018  PT End of Session - 11/20/18 1337    Visit Number  9    Number of Visits  12    Date for PT Re-Evaluation  11/22/18    Authorization Type  [employer] Medicare    Authorization Time Period  10/11/18 to 11/01/18; 11/01/18 - 11/23/18    Authorization - Visit Number  6    Authorization - Number of Visits  10    PT Start Time  1331    PT Stop Time  1409    PT Time Calculation (min)  38 min    Activity Tolerance  Patient limited by pain;Patient tolerated treatment well    Behavior During Therapy  [facility] for tasks assessed/performed       Past Medical History:  Diagnosis Date  . Allergy   . Cancer (HCC)    cervical cancer cells  . GERD (gastroesophageal reflux disease)   . Osteopenia     Past Surgical History:  Procedure Laterality Date  . ABDOMINAL HYSTERECTOMY    . BOWEL RESECTION N/A 06/22/2016   Procedure: SMALL BOWEL RESECTION;  Surgeon: Vickie Epley, MD;  Location: [facility];  Service: General;  Laterality: N/A;  . CHOLECYSTECTOMY  2005  . FRACTURE SURGERY     rt leg  . LAPAROTOMY N/A 06/22/2016   Procedure: EXPLORATORY LAPAROTOMY;  Surgeon: Vickie Epley, MD;  Location: [facility];  Service: General;  Laterality: N/A;  . LYSIS OF ADHESION N/A 06/22/2016   Procedure: LYSIS OF ADHESION;  Surgeon: Vickie Epley, MD;  Location: [facility];  Service: General;  Laterality: N/A;    There were no vitals filed for this visit.  Subjective Assessment - 11/20/18 1333    Subjective  Patient reported that her pain is a 7/10 this date.    Currently in Pain?  Yes    Pain Score  7     Pain Location  Foot    Pain Orientation  Right;Left    Pain Descriptors / Indicators  Aching     Pain Type  Chronic pain    Pain Onset  More than a month ago                       [facility] Adult PT Treatment/Exercise - 11/20/18 0001      Manual Therapy   Manual Therapy  Passive ROM;Edema management;Soft tissue mobilization    Manual therapy comments  performed seperate form other interventions    Edema Management  retrograde massage Lt ankle and Rt forefoot in elevated position    Joint Mobilization  MTP glides grade III    Soft tissue mobilization  To soles of feet to reduce pain and restrictions    Passive ROM  Toe flexion      Ankle Exercises: Stretches   Slant Board Stretch  3 reps;30 seconds    Other Stretch  bil knee drives on 12" step 9Z79" holds each      Ankle Exercises: Standing   SLS  15'' alternating sides x 4    Rocker Board  Other (comment)   x15 each direction   Heel Raises  Both;15 reps    Toe Raise  15 reps  Verbal cues for form   Other Standing Ankle Exercises  Step ups 4'' x 10 each LE forward and lateral bil HHA    Other Standing Ankle Exercises  Tandem stance on foam 4x30'' each LE               PT Short Term Goals - 11/01/18 1427      PT SHORT TERM GOAL #1   Title  Pt will be independent with HEP and report compliance with completing as instructed to facilitate reduced pain, improved QOL, and self-management of symptoms.    Time  1    Period  Weeks    Status  Achieved    Target Date  10/18/18      PT SHORT TERM GOAL #2   Title  Pt will have improved bil DF by 3 deg and bil PF by 5 deg or > to facilitate reduced pain, improved gait, and stair ambulation.     Time  1    Period  Weeks    Status  On-going      PT SHORT TERM GOAL #3   Title  Pt will report being able to perform WB activities for 15 mins with 4/10 bil foot pain in order to allow her to perform [facility] chores with greater ease.     Time  1    Period  Weeks    Status  Achieved      PT SHORT TERM GOAL #4   Title  Pt will be able tO perform bil SLS for 6 sec or  > to demo improved functional strength of bil ankles and maximize gait and stairs.     Time  1    Period  Weeks    Status  On-going        PT Long Term Goals - 11/01/18 1431      PT LONG TERM GOAL #1   Title  Pt will have reduced circumferential edema at forefoot by 0.25" to facilitate reduced pain and to maximize gait and general tolerance to WB.     Baseline  11/01/18: See objective measures    Time  3    Period  Weeks    Status  Partially Met      PT LONG TERM GOAL #2   Title  Pt will report being able to perform WB activities for 25 mins or > with 3/10 bil foot pain in order to allow her to prepare a small meal for herself and perform self-hygiene tasks with greater ease.     Baseline  11/01/18: Patient reported ability to stand and walk for at least 30 minutes    Time  3    Period  Weeks    Status  Achieved      PT LONG TERM GOAL #3   Title  Pt will have 130f improvement during 3MWT with 4/10 bil foot pain and gait mechanics improved in order to maximize community access and ambulation.    Baseline  11/01/18: 492 feet this date    Time  3    Period  Weeks    Status  Achieved      PT LONG TERM GOAL #4   Title  Pt will have 1/2 grade improvement in MMT throughout all mm groups tested in order to reduce pain and improve functional tasks.    Baseline  11/01/18: See MMT    Time  3    Period  Weeks    Status  On-going  Plan - 11/20/18 1338    Clinical Impression Statement  Patient arrived with tennis shoes on again this session. This session added balance activities to challenge patient's stability as well as to increase weightbearing and confidence with mobility. Added single limb stance on each lower extremity this session as well as tandem stance on foam this session. Patient tolerated all exercises well. She reported 9/10 pain compared to 7/10 pain at end of session, but stated that it felt better.    PT Frequency  2x / week    PT Duration  3 weeks    PT  Treatment/Interventions  ADLs/Self Care Home Management;Aquatic Therapy;Cryotherapy;Electrical Stimulation;Moist Heat;Traction;Ultrasound;DME Instruction;Gait training;Stair training;Functional mobility training;Therapeutic activities;Therapeutic exercise;Balance training;Neuromuscular re-education;Patient/family education;Orthotic Fit/Training;Manual techniques;Manual lymph drainage;Passive range of motion;Dry needling;Energy conservation;Taping;Splinting;Joint Manipulations    PT Next Visit Plan  Re-assess next session    PT Home Exercise Plan  eval: sidelying hip abd, seated ABCs, seated towel inv/ev, seated heel and toe raises; 6/11: towel cruches, bil toe flexion    Consulted and Agree with Plan of Care  Patient       Patient will benefit from skilled therapeutic intervention in order to improve the following deficits and impairments:  Abnormal gait, Decreased activity tolerance, Decreased balance, Decreased endurance, Decreased mobility, Decreased range of motion, Decreased strength, Difficulty walking, Hypomobility, Increased edema, Increased fascial restricitons, Increased muscle spasms, Impaired flexibility, Improper body mechanics, Postural dysfunction, Pain, Decreased safety awareness  Visit Diagnosis: 1. Pain in right ankle and joints of right foot   2. Pain in left ankle and joints of left foot   3. Localized edema   4. Difficulty in walking, not elsewhere classified        Problem List Patient Active Problem List   Diagnosis Date Noted  . Complex regional pain syndrome type 1 of right lower extremity 10/22/2018  . Small bowel obstruction due to adhesions ([place]) 06/22/2016  . GERD 08/31/2009  . DYSPHAGIA UNSPECIFIED 08/31/2009   Clarene Critchley PT, DPT 2:19 PM, 11/20/18 [place], [place], [address] Phone: [phone]   Fax:  [phone]  Name: Rebecca Osborne MRN: [id number] Date of Birth:  1950/06/22

## 2018-11-22 ENCOUNTER — Other Ambulatory Visit: Payer: Self-pay

## 2018-11-22 ENCOUNTER — Ambulatory Visit (INDEPENDENT_AMBULATORY_CARE_PROVIDER_SITE_OTHER): Payer: Medicare Other | Admitting: Podiatry

## 2018-11-22 ENCOUNTER — Encounter: Payer: Self-pay | Admitting: Podiatry

## 2018-11-22 VITALS — Temp 97.6°F

## 2018-11-22 DIAGNOSIS — M779 Enthesopathy, unspecified: Secondary | ICD-10-CM | POA: Diagnosis not present

## 2018-11-22 DIAGNOSIS — G9050 Complex regional pain syndrome I, unspecified: Secondary | ICD-10-CM | POA: Diagnosis not present

## 2018-11-22 MED ORDER — DULOXETINE HCL 30 MG PO CPEP: 30.0000 mg | ORAL_CAPSULE | Freq: Every day | ORAL | 0 refills | Status: DC

## 2018-11-23 ENCOUNTER — Ambulatory Visit (HOSPITAL_COMMUNITY): Payer: Medicare Other | Admitting: Physical Therapy

## 2018-11-23 DIAGNOSIS — R600 Localized edema: Secondary | ICD-10-CM

## 2018-11-23 DIAGNOSIS — R262 Difficulty in walking, not elsewhere classified: Secondary | ICD-10-CM

## 2018-11-23 DIAGNOSIS — M25572 Pain in left ankle and joints of left foot: Secondary | ICD-10-CM | POA: Diagnosis not present

## 2018-11-23 DIAGNOSIS — M25571 Pain in right ankle and joints of right foot: Secondary | ICD-10-CM

## 2018-11-23 NOTE — Patient Instructions (Addendum)
(  Home) Extension: Hip    With support under abdomen, tighten stomach. Lift right leg in line with body. Do not hyperextend.  Repeat _10___ times per set. Do __2__ sets per session. Do __7__ sessions per week.  Copyright  VHI. All rights reserved.  Heel Raise: Bilateral (Standing)    Rise on balls of feet. (stand at support surface as needed) Repeat __15__ times per set. Do _1___ sets per session. Do _1___ sessions per day.  http://orth.exer.us/39   Copyright  VHI. All rights reserved.  Toe Raise (Standing)    Rock back on heels. (stand at support surface as needed) Repeat _15___ times per set. Do _1___ sets per session. Do __1__ sessions per day.  http://orth.exer.us/43   Copyright  VHI. All rights reserved.

## 2018-11-23 NOTE — Therapy (Signed)
[facility] [address] [place], [place], [address] Phone: [phone]   Fax:  [phone]  Physical Therapy Treatment / re-assessment/ discharge summary  Patient Details  Name: Rebecca Osborne MRN: [id number] Date of Birth: 04/30/1951 Referring Provider (PT): Trula Slade, DPM   Encounter Date: 11/23/2018   Progress Note Reporting Period 11/01/18 to 11/23/18  See note below for Objective Data and Assessment of Progress/Goals.   PHYSICAL THERAPY DISCHARGE SUMMARY  Visits from Start of Care: 10  Current functional level related to goals / functional outcomes: See below   Remaining deficits: See below   Education / Equipment: Updated HEP. See below Plan: Patient agrees to discharge.  Patient goals were partially met. Patient is being discharged due to                                                     ?????    Meeting the majority of rehab goals and not making progress in the area of the one remaining goal.          PT End of Session - 11/23/18 1527    Visit Number  10    Number of Visits  12    Date for PT Re-Evaluation  11/22/18    Authorization Type  [employer]HC Medicare    Authorization Time Period  10/11/18 to 11/01/18; 11/01/18 - 11/23/18    Authorization - Visit Number  7    Authorization - Number of Visits  10    PT Start Time  9798    PT Stop Time  1510    PT Time Calculation (min)  33 min    Activity Tolerance  Patient tolerated treatment well    Behavior During Therapy  [facility] for tasks assessed/performed       Past Medical History:  Diagnosis Date  . Allergy   . Cancer (HCC)    cervical cancer cells  . GERD (gastroesophageal reflux disease)   . Osteopenia     Past Surgical History:  Procedure Laterality Date  . ABDOMINAL HYSTERECTOMY    . BOWEL RESECTION N/A 06/22/2016   Procedure: SMALL BOWEL RESECTION;  Surgeon: Vickie Epley, MD;  Location: [facility];  Service: General;  Laterality: N/A;  .  CHOLECYSTECTOMY  2005  . FRACTURE SURGERY     rt leg  . LAPAROTOMY N/A 06/22/2016   Procedure: EXPLORATORY LAPAROTOMY;  Surgeon: Vickie Epley, MD;  Location: [facility];  Service: General;  Laterality: N/A;  . LYSIS OF ADHESION N/A 06/22/2016   Procedure: LYSIS OF ADHESION;  Surgeon: Vickie Epley, MD;  Location: [facility];  Service: General;  Laterality: N/A;    There were no vitals filed for this visit.  Subjective Assessment - 11/23/18 1528    Subjective  Patient denied pain this session. Reported she has been doing her exercises at home.    Currently in Pain?  No/denies    Pain Onset  More than a month ago         [facility] PT Assessment - 11/23/18 0001      Assessment   Medical Diagnosis  Tendonitis, CRPS Type 1, first degree ankle sprain, swelling    Referring Provider (PT)  Trula Slade, DPM      Prior Function   Level of Independence  Independent  Circumferential Edema   Circumferential - Right  8.5" at head of MT    Circumferential - Left   8.25" at head of MT      Sensation   Light Touch  Appears Intact      AROM   Right Ankle Dorsiflexion  15   was 10   Right Ankle Plantar Flexion  61   was 47   Right Ankle Inversion  23   was 20   Right Ankle Eversion  20   was 15   Left Ankle Dorsiflexion  12   was 6   Left Ankle Plantar Flexion  60   was 50 then 57   Left Ankle Inversion  30   was 23   Left Ankle Eversion  19   was 17     Strength   Right Hip Flexion  5/5   was 5   Right Hip Extension  4-/5   was 4-   Right Hip ABduction  4+/5   was 4+   Left Hip Flexion  5/5   was 5   Left Hip Extension  4-/5   was 4-   Left Hip ABduction  4+/5   was 4 then 4+   Right Knee Flexion  5/5    Right Knee Extension  5/5    Left Knee Flexion  5/5    Left Knee Extension  5/5    Right Ankle Dorsiflexion  5/5   was 4+   Right Ankle Inversion  5/5   was 5   Right Ankle Eversion  5/5   was 4+   Left Ankle Dorsiflexion  5/5   was 4+   Left Ankle  Inversion  5/5   was 4+   Left Ankle Eversion  5/5   was 4+     Palpation   Palpation comment  Tenderness on the right foot sole of the foot in the center and around toes. On the left patient reported tenderness in medial ankle with palpation.       Ambulation/Gait   Ambulation Distance (Feet)  472 Feet   2MWT   Assistive device  None    Gait Pattern  Decreased stride length    Ambulation Surface  Level;Indoor    Gait velocity  1.19 m/s    Stairs  Yes    Stair Management Technique  One rail Left;Alternating pattern    Number of Stairs  4    Height of Stairs  7   inches     Static Standing Balance   Static Standing - Balance Support  No upper extremity supported    Static Standing Balance -  Activities   Single Leg Stance - Right Leg;Single Leg Stance - Left Leg    Static Standing - Comment/# of Minutes  8'' right; 10'' left                           PT Education - 11/23/18 1529    Education Details  Discussed re-assessment findings, updated HEP, possibly discussing compression garments with her physician, and plan to discharge to HEP.    Person(s) Educated  Patient    Methods  Explanation;Handout    Comprehension  Verbalized understanding       PT Short Term Goals - 11/23/18 1520      PT SHORT TERM GOAL #1   Title  Pt will be independent with HEP and report compliance with completing as instructed to  facilitate reduced pain, improved QOL, and self-management of symptoms.    Time  1    Period  Weeks    Status  Achieved    Target Date  10/18/18      PT SHORT TERM GOAL #2   Title  Pt will have improved bil DF by 3 deg and bil PF by 5 deg or > to facilitate reduced pain, improved gait, and stair ambulation.     Time  1    Period  Weeks    Status  Achieved      PT SHORT TERM GOAL #3   Title  Pt will report being able to perform WB activities for 15 mins with 4/10 bil foot pain in order to allow her to perform [facility] chores with greater ease.     Time  1     Period  Weeks    Status  Achieved      PT SHORT TERM GOAL #4   Title  Pt will be able tO perform bil SLS for 6 sec or > to demo improved functional strength of bil ankles and maximize gait and stairs.     Time  1    Period  Weeks    Status  Achieved        PT Long Term Goals - 11/23/18 1521      PT LONG TERM GOAL #1   Title  Pt will have reduced circumferential edema at forefoot by 0.25" to facilitate reduced pain and to maximize gait and general tolerance to WB.     Baseline  11/23/18: See objective measures    Time  3    Period  Weeks    Status  Partially Met      PT LONG TERM GOAL #2   Title  Pt will report being able to perform WB activities for 25 mins or > with 3/10 bil foot pain in order to allow her to prepare a small meal for herself and perform self-hygiene tasks with greater ease.     Baseline  11/01/18: Patient reported ability to stand and walk for at least 30 minutes    Time  3    Period  Weeks    Status  Achieved      PT LONG TERM GOAL #3   Title  Pt will have 111f improvement during 3MWT with 4/10 bil foot pain and gait mechanics improved in order to maximize community access and ambulation.    Baseline  11/01/18: 492 feet this date    Time  3    Period  Weeks    Status  Achieved      PT LONG TERM GOAL #4   Title  Pt will have 1/2 grade improvement in MMT throughout all mm groups tested in order to reduce pain and improve functional tasks.    Baseline  11/23/18: See MMT    Time  3    Period  Weeks    Status  Achieved            Plan - 11/23/18 1527    Clinical Impression Statement  Performed re-assessment of patient's progress towards goals. Patient had met all goals except patient continued to have some swelling particularly in the right forefoot. Patient's ROM, strength, and balance had improved at this re-assessment. In addition, patient performed ambulation without a surgical shoe and without an assistive device this session at a rate of 1.19 m/s  which is safe for community ambulation. In addition, patient  ascended and descended 7'' stairs with reciprocal pattern. Educated patient on examination findings and provided patient with updated HEP including hip extension strengthening and standing heel and toe raises. Discussed with patient that she could ask her physician about if he recommended compression garments to address the remaining swelling as the swelling amount had not changed from the last assessment to this assessment, a compression garment may assist with this. Discussed with patient that as she had met the majority of her goals and had demonstrated good progress in therapy, feel that patient could continue performing recommended exercises at home in order to continue progressing her strength and overall functional mobility. Patient gave consent to this. Therapist explained that patient could call the clinic with any future questions or concerns.    PT Frequency  2x / week    PT Duration  3 weeks    PT Treatment/Interventions  ADLs/Self Care Home Management;Aquatic Therapy;Cryotherapy;Electrical Stimulation;Moist Heat;Traction;Ultrasound;DME Instruction;Gait training;Stair training;Functional mobility training;Therapeutic activities;Therapeutic exercise;Balance training;Neuromuscular re-education;Patient/family education;Orthotic Fit/Training;Manual techniques;Manual lymph drainage;Passive range of motion;Dry needling;Energy conservation;Taping;Splinting;Joint Manipulations    PT Next Visit Plan  Discharged    PT Home Exercise Plan  eval: sidelying hip abd, seated ABCs, seated towel inv/ev, seated heel and toe raises; 6/11: towel cruches, bil toe flexion; 11/23/18: Heel raise/toe raise in standing x 15 1x/day, prone hip extension 2x10 each LE 1x/day    Consulted and Agree with Plan of Care  Patient       Patient will benefit from skilled therapeutic intervention in order to improve the following deficits and impairments:  Abnormal gait,  Decreased activity tolerance, Decreased balance, Decreased endurance, Decreased mobility, Decreased range of motion, Decreased strength, Difficulty walking, Hypomobility, Increased edema, Increased fascial restricitons, Increased muscle spasms, Impaired flexibility, Improper body mechanics, Postural dysfunction, Pain, Decreased safety awareness  Visit Diagnosis: 1. Pain in right ankle and joints of right foot   2. Pain in left ankle and joints of left foot   3. Localized edema   4. Difficulty in walking, not elsewhere classified        Problem List Patient Active Problem List   Diagnosis Date Noted  . Complex regional pain syndrome type 1 of right lower extremity 10/22/2018  . Small bowel obstruction due to adhesions ([place]) 06/22/2016  . GERD 08/31/2009  . DYSPHAGIA UNSPECIFIED 08/31/2009   Clarene Critchley PT, DPT 3:33 PM, 11/23/18 [place], [place], [address] Phone: [phone]   Fax:  [phone]  Name: Rebecca Osborne MRN: [id number] Date of Birth: 1951-02-10

## 2018-11-28 NOTE — Progress Notes (Signed)
Subjective: 68 year old female presents the office today for follow-up of pain concerns of swelling to both of her feet, CRPS.  Overall she has made good progress since I last saw her.  She is been going to physical therapy.  She presents today wearing a regular shoe.  She is asking for me to refill the Cymbalta for 90-day supply that was prescribed by Dr. Posey Pronto.  Swelling is also improved.  She still gets discomfort but overall improving. She denies any fevers, chills, nausea, vomiting.  No calf pain chest pain shortness of breath.   Objective: AAO x3, NAD DP/PT pulses palpable bilaterally, CRT less than 3 seconds Overall the swelling is much improved compared to what it was.  The skin is equal temperature and color bilaterally.  She still has a minimal discomfort submetatarsal area in the right foot on the course of the posterior tibial, flexor tendon on the left side.  Flexor, extensor tendons appear to be intact.  Achilles tendon appears intact. No pain with calf compression, swelling, warmth, erythema  Assessment: Right foot CRPS, left ankle tendinitis  Plan: -All treatment options discussed with the patient including all alternatives, risks, complications.  -Overall she is made good progress.  Encouraged to continue with current medication regimen.  I did refill the Cymbalta for 90-day supply.  Continue physical therapy.  Also encouraged continued home physical therapy.  Gradually increase activity instead of the regular shoes.  Return in about 6 weeks (around 01/03/2019).  Trula Slade DPM

## 2018-12-16 NOTE — Progress Notes (Signed)
  Subjective:  Patient ID: Rebecca Osborne, female    DOB: 01-22-1951,  MRN: [id number]  Chief Complaint  Patient presents with  . Ankle Pain    Pt states twisted ankle and hurt medial side.  . Foot Problem    Pt states still having swelling and pain all over right foot.    68 y.o. female presents with the above complaint.  Still having pain over the right foot states that she twisted her ankle and is having a lot of pain  Review of Systems: Negative except as noted in the HPI. Denies N/V/F/Ch.  Past Medical History:  Diagnosis Date  . Allergy   . Cancer (HCC)    cervical cancer cells  . GERD (gastroesophageal reflux disease)   . Osteopenia     Current Outpatient Medications:  .  DULoxetine (CYMBALTA) 30 MG capsule, Take 1 capsule (30 mg total) by mouth daily., Disp: 90 capsule, Rfl: 0 .  ibuprofen (ADVIL) 600 MG tablet, TAKE 1 TABLET BY MOUTH EVERY 8 HOURS AS NEEDED, Disp: 30 tablet, Rfl: 0 .  lidocaine (XYLOCAINE) 5 % ointment, Apply 1 application topically as needed., Disp: 35.44 g, Rfl: 1  Social History   Tobacco Use  Smoking Status Never Smoker  Smokeless Tobacco Never Used    Allergies  Allergen Reactions  . Codeine Itching   Objective:  There were no vitals filed for this visit. There is no height or weight on file to calculate BMI. Constitutional Well developed. Well nourished.  Vascular Dorsalis pedis pulses palpable bilaterally. Posterior tibial pulses palpable bilaterally. Capillary refill normal to all digits.  No cyanosis or clubbing noted. Pedal hair growth normal. Varicosities present with spider veins and telangiectasias bilateral Edema right foot.  Right foot warm to touch.  Neurologic Normal speech. Oriented to person, place, and time. Epicritic sensation to light touch grossly present bilaterally.  Dermatologic Nails well groomed and normal in appearance. No open wounds. No skin lesions.  Orthopedic: Normal joint ROM without pain or crepitus  bilaterally. No visible deformities. Pain to palpation about the right third interspace, plantar right third interspace, dorsal foot extensor tendons Pain location about the ATFL right   Assessment:   1. Capsulitis of ankle, right   2. Venous reflux    Plan:  Patient was evaluated and treated and all questions answered.  Venous insufficiency bilateral with venous reflux.,  Ankle injury -Continue immobilization ice compression.  Follow-up in 2 weeks for recheck discussed use of ankle brace to assist in immobilization  Return in about 2 weeks (around 08/09/2018) for Ankle injiury f/u .

## 2018-12-23 DIAGNOSIS — M25572 Pain in left ankle and joints of left foot: Secondary | ICD-10-CM | POA: Diagnosis not present

## 2018-12-23 DIAGNOSIS — M25571 Pain in right ankle and joints of right foot: Secondary | ICD-10-CM | POA: Diagnosis not present

## 2019-01-03 ENCOUNTER — Ambulatory Visit: Payer: Medicare Other | Admitting: Podiatry

## 2019-01-03 ENCOUNTER — Telehealth: Payer: Self-pay | Admitting: *Deleted

## 2019-01-03 NOTE — Telephone Encounter (Signed)
Called and there was no answer and it stated to try the call again. Rebecca Osborne

## 2019-01-10 ENCOUNTER — Telehealth: Payer: Self-pay | Admitting: Podiatry

## 2019-01-10 ENCOUNTER — Ambulatory Visit (INDEPENDENT_AMBULATORY_CARE_PROVIDER_SITE_OTHER): Payer: Medicare Other

## 2019-01-10 ENCOUNTER — Other Ambulatory Visit: Payer: Self-pay

## 2019-01-10 ENCOUNTER — Encounter: Payer: Self-pay | Admitting: Podiatry

## 2019-01-10 ENCOUNTER — Ambulatory Visit (INDEPENDENT_AMBULATORY_CARE_PROVIDER_SITE_OTHER): Payer: Medicare Other | Admitting: Podiatry

## 2019-01-10 VITALS — Temp 98.3°F

## 2019-01-10 DIAGNOSIS — M779 Enthesopathy, unspecified: Secondary | ICD-10-CM

## 2019-01-10 DIAGNOSIS — G9050 Complex regional pain syndrome I, unspecified: Secondary | ICD-10-CM

## 2019-01-10 MED ORDER — DULOXETINE HCL 30 MG PO CPEP: 30.0000 mg | ORAL_CAPSULE | Freq: Every day | ORAL | 0 refills | Status: DC

## 2019-01-10 MED ORDER — IBUPROFEN 600 MG PO TABS: 600.0000 mg | ORAL_TABLET | Freq: Three times a day (TID) | ORAL | 0 refills | Status: DC | PRN

## 2019-01-10 MED ORDER — VITAMIN D (ERGOCALCIFEROL) 1.25 MG (50000 UNIT) PO CAPS: 50000.0000 [IU] | ORAL_CAPSULE | ORAL | 0 refills | Status: DC

## 2019-01-21 ENCOUNTER — Telehealth: Payer: Self-pay | Admitting: *Deleted

## 2019-01-21 DIAGNOSIS — T148XXA Other injury of unspecified body region, initial encounter: Secondary | ICD-10-CM

## 2019-01-21 DIAGNOSIS — G9050 Complex regional pain syndrome I, unspecified: Secondary | ICD-10-CM

## 2019-01-21 DIAGNOSIS — M779 Enthesopathy, unspecified: Secondary | ICD-10-CM

## 2019-01-21 NOTE — Progress Notes (Signed)
Subjective: 68 year old female presents the office today for follow-up of pain concerns of swelling to both of her feet, CRPS.  She did finish physical therapy and she feels that she has made improvement.  She still gets pain to the forefoot on the right side as well as the left ankle she points on the medial aspect.  She said some swelling as well the left ankle.  No recent injury.  She is asking for refill of the Cymbalta 90-day supply as she did not get this at her last fill the form she also has a refill of ibuprofen. She denies any fevers, chills, nausea, vomiting.  No calf pain chest pain shortness of breath.   Objective: AAO x3, NAD DP/PT pulses palpable bilaterally, CRT less than 3 seconds There is still some residual swelling in the right foot mostly submetatarsal 2 and 3 area.  There is is some deviation of the lesser digits in the lateral direction.  The majority of her discomfort is mostly on the left ankle along the course the posterior tibial, flexor tendons and there is some mild edema to this area.  There is equal temperature gradient bilaterally there is normal coloration of the skin today.  No pain with calf compression, swelling, warmth, erythema  Assessment: Right foot CRPS, left ankle tendinitis without tear  Plan: -All treatment options discussed with the patient including all alternatives, risks, complications.  -Today refilled her Cymbalta as well as ibuprofen.  Encouraged her to continue with range of motion exercises.  Also made her an insert to wear inside the shoe to help offload the metatarsal head area.  Discussed continue activity and range of motion.  I will order an MRI of the left ankle to rule out a tear.  Trula Slade DPM

## 2019-01-21 NOTE — Telephone Encounter (Signed)
Faxed orders to [facility].

## 2019-01-21 NOTE — Telephone Encounter (Signed)
-----   Message from Trula Slade, DPM sent at 01/21/2019  7:16 AM EDT ----- Can you please order an MRI of the left ankle to rule out tendon tear of flexor tendons? Thanks.

## 2019-01-23 DIAGNOSIS — M25571 Pain in right ankle and joints of right foot: Secondary | ICD-10-CM | POA: Diagnosis not present

## 2019-01-23 DIAGNOSIS — M25572 Pain in left ankle and joints of left foot: Secondary | ICD-10-CM | POA: Diagnosis not present

## 2019-01-29 ENCOUNTER — Telehealth: Payer: Self-pay

## 2019-01-29 ENCOUNTER — Other Ambulatory Visit: Payer: Self-pay

## 2019-01-29 ENCOUNTER — Ambulatory Visit (INDEPENDENT_AMBULATORY_CARE_PROVIDER_SITE_OTHER): Payer: Medicare Other | Admitting: Podiatry

## 2019-01-29 VITALS — Temp 98.3°F

## 2019-01-29 DIAGNOSIS — E559 Vitamin D deficiency, unspecified: Secondary | ICD-10-CM

## 2019-01-29 DIAGNOSIS — G9050 Complex regional pain syndrome I, unspecified: Secondary | ICD-10-CM | POA: Diagnosis not present

## 2019-01-29 DIAGNOSIS — M779 Enthesopathy, unspecified: Secondary | ICD-10-CM | POA: Diagnosis not present

## 2019-01-29 MED ORDER — VITAMIN D (ERGOCALCIFEROL) 1.25 MG (50000 UNIT) PO CAPS: 50000.0000 [IU] | ORAL_CAPSULE | ORAL | 0 refills | Status: DC

## 2019-01-29 NOTE — Telephone Encounter (Signed)
Left message with patients daughter stating Rebecca Osborne needed to get lab paper work/refferal and to pick it up at the front desk at the office her visit was at ([facility] in [place], [place])

## 2019-02-03 NOTE — Progress Notes (Signed)
Subjective: 68 year old female presents the office today for follow-up of pain concerns of swelling to both of her feet, CRPS.  Overall he thinks that she is doing better but she still getting pain most on the left ankle.  She did not get the MRI because of transportation issues but overall she states is actually feeling better.  She feels the orthotics are comfortable however they are too tight inside of her shoes.  Still has a medications with any changes. She denies any fevers, chills, nausea, vomiting.  No calf pain chest pain shortness of breath.   Objective: AAO x3, NAD DP/PT pulses palpable bilaterally, CRT less than 3 seconds There is still some residual swelling in the right foot mostly submetatarsal 2 and 3 area which is unchanged.  There is lateral deviation of the second, third, fourth digits most of the third and fourth toes.  They are semirigid in its position.  Prominent metatarsal head plantarly.  No other pain of the right foot or ankle.  The left side are still tenderness along the course of the flexor, posterior tibial tendon.  Tendon appears to be intact.  There is decreased edema to the area pain is equal to retrograde bilaterally.  Normal coloration. No pain with calf compression, swelling, warmth, erythema  Assessment: Right foot CRPS, left ankle tendinitis   Plan: -All treatment options discussed with the patient including all alternatives, risks, complications.  -Continue current medication regimen.  Also refilled vitamin D.  I do want check a vitamin D level in about a month. -She also hold off on the MRI.  She had transportation issues.  Also her pain is improving.  If needed we can order this again for her at [facility]. -We thinned down the orthotics made her more comfortable inside of her shoes  Return in about 4 weeks (around 02/26/2019).  Trula Slade DPM

## 2019-02-21 ENCOUNTER — Other Ambulatory Visit: Payer: Self-pay

## 2019-02-21 ENCOUNTER — Ambulatory Visit (HOSPITAL_COMMUNITY)
Admission: EM | Admit: 2019-02-21 | Discharge: 2019-02-21 | Disposition: A | Discharge status: Home / Self Care | Payer: Medicare Other | Attending: Internal Medicine | Admitting: Internal Medicine

## 2019-02-21 ENCOUNTER — Encounter (HOSPITAL_COMMUNITY): Payer: Self-pay

## 2019-02-21 DIAGNOSIS — N390 Urinary tract infection, site not specified: Secondary | ICD-10-CM | POA: Diagnosis not present

## 2019-02-21 LAB — POCT URINALYSIS DIP (DEVICE)
Glucose, UA: NEGATIVE mg/dL
Ketones, ur: 40 mg/dL — AB
Nitrite: NEGATIVE
Protein, ur: 100 mg/dL — AB
Specific Gravity, Urine: 1.02 (ref 1.005–1.030)
Urobilinogen, UA: 0.2 mg/dL (ref 0.0–1.0)
pH: 5.5 (ref 5.0–8.0)

## 2019-02-21 LAB — CBC
HCT: 33.7 % — ABNORMAL LOW (ref 36.0–46.0)
Hemoglobin: 11.1 g/dL — ABNORMAL LOW (ref 12.0–15.0)
MCH: 29.7 pg (ref 26.0–34.0)
MCHC: 32.9 g/dL (ref 30.0–36.0)
MCV: 90.1 fL (ref 80.0–100.0)
Platelets: 236 10*3/uL (ref 150–400)
RBC: 3.74 MIL/uL — ABNORMAL LOW (ref 3.87–5.11)
RDW: 13.6 % (ref 11.5–15.5)
WBC: 14.3 10*3/uL — ABNORMAL HIGH (ref 4.0–10.5)
nRBC: 0 % (ref 0.0–0.2)

## 2019-02-21 MED ORDER — CEPHALEXIN 500 MG PO CAPS: 500.0000 mg | ORAL_CAPSULE | Freq: Two times a day (BID) | ORAL | 0 refills | Status: AC

## 2019-02-21 MED ORDER — CEPHALEXIN 500 MG PO CAPS: 500.0000 mg | ORAL_CAPSULE | Freq: Four times a day (QID) | ORAL | 0 refills | Status: DC

## 2019-02-21 NOTE — ED Triage Notes (Signed)
Pt states she has a UTI. Pt states she has pressure when she void. Pt states she has lower back pain.

## 2019-02-21 NOTE — ED Provider Notes (Signed)
[facility]    CSN: [id number] Arrival date & time: 02/21/19  [place]      History   Chief Complaint Chief Complaint  Patient presents with  . Urinary Tract Infection    HPI Rebecca Osborne is a 68 y.o. female history of breast comes in with 2-day history of dysuria, urgency and frequency as well as couple of days ago and has been persistent.  She denies any nausea or vomiting..  No fever or chills.  No change in bowel.  Patient denies any flank pain.   No dizziness, near syncope or syncopal episode.  HPI  Past Medical History:  Diagnosis Date  . Allergy   . Cancer (HCC)    cervical cancer cells  . GERD (gastroesophageal reflux disease)   . Osteopenia     Patient Active Problem List   Diagnosis Date Noted  . Complex regional pain syndrome type 1 of right lower extremity 10/22/2018  . Small bowel obstruction due to adhesions ([place]) 06/22/2016  . GERD 08/31/2009  . DYSPHAGIA UNSPECIFIED 08/31/2009    Past Surgical History:  Procedure Laterality Date  . ABDOMINAL HYSTERECTOMY    . BOWEL RESECTION N/A 06/22/2016   Procedure: SMALL BOWEL RESECTION;  Surgeon: Vickie Epley, MD;  Location: [facility];  Service: General;  Laterality: N/A;  . CHOLECYSTECTOMY  2005  . FRACTURE SURGERY     rt leg  . LAPAROTOMY N/A 06/22/2016   Procedure: EXPLORATORY LAPAROTOMY;  Surgeon: Vickie Epley, MD;  Location: [facility];  Service: General;  Laterality: N/A;  . LYSIS OF ADHESION N/A 06/22/2016   Procedure: LYSIS OF ADHESION;  Surgeon: Vickie Epley, MD;  Location: [facility];  Service: General;  Laterality: N/A;    OB History    Gravida      Para      Term      Preterm      AB      Living  3     SAB      TAB      Ectopic      Multiple      Live Births               Home Medications    Prior to Admission medications   Medication Sig Start Date End Date Taking? Authorizing Provider  cephALEXin (KEFLEX) 500 MG capsule Take 1 capsule (500 mg total) by mouth  2 (two) times daily for 7 days. 02/21/19 02/28/19  Chase Picket, MD  DULoxetine (CYMBALTA) 30 MG capsule Take 1 capsule (30 mg total) by mouth daily. 01/10/19   Trula Slade, DPM  ibuprofen (ADVIL) 600 MG tablet Take 1 tablet (600 mg total) by mouth every 8 (eight) hours as needed. 01/10/19   Trula Slade, DPM  lidocaine (XYLOCAINE) 5 % ointment Apply 1 application topically as needed. 10/22/18   Jamse Arn, MD  Vitamin D, Ergocalciferol, (DRISDOL) 1.25 MG (50000 UT) CAPS capsule Take 1 capsule (50,000 Units total) by mouth every 7 (seven) days. 01/29/19   Trula Slade, DPM    Family History Family History  Problem Relation Age of Onset  . COPD Mother   . Heart disease Mother   . Miscarriages / [place] Mother   . Vision loss Mother        from shingles  . COPD Father   . Cancer Brother        leukemia  . Early death Brother     Social  History Social History   Tobacco Use  . Smoking status: Never Smoker  . Smokeless tobacco: Never Used  Substance Use Topics  . Alcohol use: No  . Drug use: No     Allergies   Codeine   Review of Systems Review of Systems  Constitutional: Positive for activity change. Negative for fatigue, fever and unexpected weight change.  HENT: Negative.   Respiratory: Negative.   Cardiovascular: Negative.   Gastrointestinal: Positive for abdominal pain. Negative for nausea and vomiting.  Genitourinary: Positive for difficulty urinating, dysuria, flank pain and frequency. Negative for genital sores, hematuria, menstrual problem and pelvic pain.  Musculoskeletal: Negative for arthralgias.  Skin: Negative.   Neurological: Negative.      Physical Exam Triage Vital Signs ED Triage Vitals  Enc Vitals Group     BP 02/21/19 1938 125/68     Pulse Rate 02/21/19 1938 90     Resp 02/21/19 1938 18     Temp 02/21/19 1938 97.8 F (36.6 C)     Temp Source 02/21/19 1938 Oral     SpO2 02/21/19 1938 98 %     Weight 02/21/19 1937  125 lb (56.7 kg)     Height --      Head Circumference --      Peak Flow --      Pain Score 02/21/19 1936 8     Pain Loc --      Pain Edu? --      Excl. in [place]? --    No data found.  Updated Vital Signs BP 125/68 (BP Location: Right Arm)   Pulse 90   Temp 97.8 F (36.6 C) (Oral)   Resp 18   Wt 56.7 kg   SpO2 98%   BMI 25.25 kg/m   Visual Acuity Right Eye Distance:   Left Eye Distance:   Bilateral Distance:    Right Eye Near:   Left Eye Near:    Bilateral Near:     Physical Exam Vitals signs and nursing note reviewed.  Constitutional:      General: She is not in acute distress.    Appearance: She is ill-appearing. She is not toxic-appearing.  Cardiovascular:     Rate and Rhythm: Normal rate and regular rhythm.     Pulses: Normal pulses.     Heart sounds: Normal heart sounds.  Pulmonary:     Effort: Pulmonary effort is normal.     Breath sounds: Normal breath sounds.  Abdominal:     General: Bowel sounds are normal.     Palpations: Abdomen is soft.     Tenderness: There is abdominal tenderness. There is no right CVA tenderness, left CVA tenderness, guarding or rebound.  Musculoskeletal: Normal range of motion.        General: No swelling or tenderness.  Skin:    General: Skin is warm.     Capillary Refill: Capillary refill takes less than 2 seconds.     Findings: No bruising or lesion.  Neurological:     General: No focal deficit present.     Mental Status: She is alert and oriented to person, place, and time.     Cranial Nerves: No cranial nerve deficit.     Sensory: No sensory deficit.      UC Treatments / Results  Labs (all labs ordered are listed, but only abnormal results are displayed) Labs Reviewed  CBC - Abnormal; Notable for the following components:      Result Value   WBC  14.3 (*)    RBC 3.74 (*)    Hemoglobin 11.1 (*)    HCT 33.7 (*)    All other components within normal limits  POCT URINALYSIS DIP (DEVICE) - Abnormal; Notable for the  following components:   Bilirubin Urine SMALL (*)    Ketones, ur 40 (*)    Hgb urine dipstick MODERATE (*)    Protein, ur 100 (*)    Leukocytes,Ua SMALL (*)    All other components within normal limits  URINE CULTURE    EKG   Radiology No results found.  Procedures Procedures (including critical care time)  Medications Ordered in UC Medications - No data to display  Initial Impression / Assessment and Plan / UC Course  I have reviewed the triage vital signs and the nursing notes.  Pertinent labs & imaging results that were available during my care of the patient were reviewed by me and considered in my medical decision making (see chart for details).     1.  Urinary tract infection: Point-of-care urinalysis was positive for leukocyte esterase, blood in urine CBC was drawn Patient was started on cephalexin 500 mg twice daily for 7 days. If patient's symptoms worsens she is advised to return to urgent care or go to emergency department for further evaluation. Final Clinical Impressions(s) / UC Diagnoses   Final diagnoses:  Lower urinary tract infectious disease   Discharge Instructions   None    ED Prescriptions    Medication Sig Dispense Auth. Provider   cephALEXin (KEFLEX) 500 MG capsule  (Status: Discontinued) Take 1 capsule (500 mg total) by mouth 4 (four) times daily. 20 capsule Ivanna Kocak, Myrene Galas, MD   cephALEXin (KEFLEX) 500 MG capsule Take 1 capsule (500 mg total) by mouth 2 (two) times daily for 7 days. 14 capsule Zared Knoth, Myrene Galas, MD     PDMP not reviewed this encounter.   Chase Picket, MD 02/23/19 [phone]

## 2019-02-22 ENCOUNTER — Telehealth (HOSPITAL_COMMUNITY): Payer: Self-pay | Admitting: Emergency Medicine

## 2019-02-22 NOTE — Telephone Encounter (Signed)
Pt called asking for clarification on how to take the medicine. Per dr Lanny Cramp, take BID x7 days. Pt was given the 4 times a day prescription. All questions answered.

## 2019-02-23 DIAGNOSIS — M25571 Pain in right ankle and joints of right foot: Secondary | ICD-10-CM | POA: Diagnosis not present

## 2019-02-23 DIAGNOSIS — M25572 Pain in left ankle and joints of left foot: Secondary | ICD-10-CM | POA: Diagnosis not present

## 2019-02-25 ENCOUNTER — Ambulatory Visit: Payer: Medicare Other | Admitting: Podiatry

## 2019-02-26 LAB — URINE CULTURE: Culture: 40000 — AB

## 2019-02-28 ENCOUNTER — Telehealth (HOSPITAL_COMMUNITY): Payer: Self-pay | Admitting: Emergency Medicine

## 2019-02-28 NOTE — Telephone Encounter (Signed)
Called patient to see how she was feeling, pt is much better, confirmed she is finishing up antibiotics tomorrow. All questions answered.

## 2019-03-11 ENCOUNTER — Other Ambulatory Visit: Payer: Self-pay

## 2019-03-11 ENCOUNTER — Emergency Department (HOSPITAL_COMMUNITY): Payer: Medicare Other

## 2019-03-11 ENCOUNTER — Encounter (HOSPITAL_COMMUNITY): Payer: Self-pay

## 2019-03-11 ENCOUNTER — Inpatient Hospital Stay (HOSPITAL_COMMUNITY)
Admission: EM | Admit: 2019-03-11 | Discharge: 2019-03-16 | DRG: 373 | Disposition: A | Discharge status: Home / Self Care | Payer: Medicare Other | Attending: Internal Medicine | Admitting: Internal Medicine

## 2019-03-11 DIAGNOSIS — E876 Hypokalemia: Secondary | ICD-10-CM | POA: Diagnosis present

## 2019-03-11 DIAGNOSIS — A0472 Enterocolitis due to Clostridium difficile, not specified as recurrent: Secondary | ICD-10-CM | POA: Diagnosis not present

## 2019-03-11 DIAGNOSIS — M79671 Pain in right foot: Secondary | ICD-10-CM | POA: Diagnosis not present

## 2019-03-11 DIAGNOSIS — Z8719 Personal history of other diseases of the digestive system: Secondary | ICD-10-CM | POA: Diagnosis not present

## 2019-03-11 DIAGNOSIS — K5100 Ulcerative (chronic) pancolitis without complications: Secondary | ICD-10-CM

## 2019-03-11 DIAGNOSIS — Z8541 Personal history of malignant neoplasm of cervix uteri: Secondary | ICD-10-CM

## 2019-03-11 DIAGNOSIS — K219 Gastro-esophageal reflux disease without esophagitis: Secondary | ICD-10-CM | POA: Diagnosis not present

## 2019-03-11 DIAGNOSIS — D519 Vitamin B12 deficiency anemia, unspecified: Secondary | ICD-10-CM | POA: Diagnosis not present

## 2019-03-11 DIAGNOSIS — Z20828 Contact with and (suspected) exposure to other viral communicable diseases: Secondary | ICD-10-CM | POA: Diagnosis present

## 2019-03-11 DIAGNOSIS — M25572 Pain in left ankle and joints of left foot: Secondary | ICD-10-CM | POA: Diagnosis not present

## 2019-03-11 DIAGNOSIS — K529 Noninfective gastroenteritis and colitis, unspecified: Secondary | ICD-10-CM | POA: Diagnosis present

## 2019-03-11 DIAGNOSIS — Z8619 Personal history of other infectious and parasitic diseases: Secondary | ICD-10-CM | POA: Diagnosis not present

## 2019-03-11 DIAGNOSIS — Z9049 Acquired absence of other specified parts of digestive tract: Secondary | ICD-10-CM

## 2019-03-11 DIAGNOSIS — Z87440 Personal history of urinary (tract) infections: Secondary | ICD-10-CM | POA: Diagnosis not present

## 2019-03-11 DIAGNOSIS — Z885 Allergy status to narcotic agent status: Secondary | ICD-10-CM

## 2019-03-11 DIAGNOSIS — Z806 Family history of leukemia: Secondary | ICD-10-CM

## 2019-03-11 DIAGNOSIS — D649 Anemia, unspecified: Secondary | ICD-10-CM | POA: Diagnosis not present

## 2019-03-11 DIAGNOSIS — Z90710 Acquired absence of both cervix and uterus: Secondary | ICD-10-CM

## 2019-03-11 DIAGNOSIS — Z79899 Other long term (current) drug therapy: Secondary | ICD-10-CM

## 2019-03-11 DIAGNOSIS — E8809 Other disorders of plasma-protein metabolism, not elsewhere classified: Secondary | ICD-10-CM | POA: Diagnosis present

## 2019-03-11 DIAGNOSIS — Z8781 Personal history of (healed) traumatic fracture: Secondary | ICD-10-CM | POA: Diagnosis not present

## 2019-03-11 DIAGNOSIS — K6389 Other specified diseases of intestine: Secondary | ICD-10-CM | POA: Diagnosis not present

## 2019-03-11 DIAGNOSIS — M8580 Other specified disorders of bone density and structure, unspecified site: Secondary | ICD-10-CM | POA: Diagnosis not present

## 2019-03-11 DIAGNOSIS — Z03818 Encounter for observation for suspected exposure to other biological agents ruled out: Secondary | ICD-10-CM | POA: Diagnosis not present

## 2019-03-11 DIAGNOSIS — R197 Diarrhea, unspecified: Secondary | ICD-10-CM | POA: Diagnosis not present

## 2019-03-11 DIAGNOSIS — A09 Infectious gastroenteritis and colitis, unspecified: Secondary | ICD-10-CM | POA: Diagnosis not present

## 2019-03-11 LAB — COMPREHENSIVE METABOLIC PANEL
ALT: 12 U/L (ref 0–44)
AST: 13 U/L — ABNORMAL LOW (ref 15–41)
Albumin: 3.8 g/dL (ref 3.5–5.0)
Alkaline Phosphatase: 78 U/L (ref 38–126)
Anion gap: 9 (ref 5–15)
BUN: 24 mg/dL — ABNORMAL HIGH (ref 8–23)
CO2: 21 mmol/L — ABNORMAL LOW (ref 22–32)
Calcium: 9.2 mg/dL (ref 8.9–10.3)
Chloride: 111 mmol/L (ref 98–111)
Creatinine, Ser: 0.91 mg/dL (ref 0.44–1.00)
GFR calc Af Amer: >60 mL/min (ref >60)
GFR calc non Af Amer: >60 mL/min (ref >60)
Glucose, Bld: 108 mg/dL — ABNORMAL HIGH (ref 70–99)
Potassium: 3.8 mmol/L (ref 3.5–5.1)
Sodium: 141 mmol/L (ref 135–145)
Total Bilirubin: 0.3 mg/dL (ref 0.3–1.2)
Total Protein: 7.3 g/dL (ref 6.5–8.1)

## 2019-03-11 LAB — CBC
HCT: 34.6 % — ABNORMAL LOW (ref 36.0–46.0)
Hemoglobin: 10.8 g/dL — ABNORMAL LOW (ref 12.0–15.0)
MCH: 29.3 pg (ref 26.0–34.0)
MCHC: 31.2 g/dL (ref 30.0–36.0)
MCV: 94 fL (ref 80.0–100.0)
Platelets: 293 10*3/uL (ref 150–400)
RBC: 3.68 MIL/uL — ABNORMAL LOW (ref 3.87–5.11)
RDW: 13.2 % (ref 11.5–15.5)
WBC: 13 10*3/uL — ABNORMAL HIGH (ref 4.0–10.5)
nRBC: 0 % (ref 0.0–0.2)

## 2019-03-11 LAB — LIPASE, BLOOD: Lipase: 27 U/L (ref 11–51)

## 2019-03-11 MED ORDER — IOHEXOL 300 MG/ML  SOLN
100.0000 mL | Freq: Once | INTRAMUSCULAR | Status: AC | PRN
  Administered 2019-03-11: 22:00:00 100 mL via INTRAVENOUS

## 2019-03-11 MED ORDER — SODIUM CHLORIDE 0.9 % IV SOLN
INTRAVENOUS | Status: DC
  Administered 2019-03-12: 11:00:00 via INTRAVENOUS
  Administered 2019-03-12: 100 mL/h via INTRAVENOUS
  Administered 2019-03-12: 22:00:00 via INTRAVENOUS

## 2019-03-11 MED ORDER — FENTANYL CITRATE (PF) 100 MCG/2ML IJ SOLN
50.0000 ug | Freq: Once | INTRAMUSCULAR | Status: AC
  Administered 2019-03-11: 22:00:00 50 ug via INTRAVENOUS
  Filled 2019-03-11: qty 2

## 2019-03-11 MED ORDER — ACETAMINOPHEN 325 MG PO TABS
650.0000 mg | ORAL_TABLET | Freq: Four times a day (QID) | ORAL | Status: DC | PRN
  Administered 2019-03-12 – 2019-03-14 (×2): 650 mg via ORAL
  Filled 2019-03-11 (×2): qty 2

## 2019-03-11 MED ORDER — VANCOMYCIN 50 MG/ML ORAL SOLUTION
125.0000 mg | Freq: Four times a day (QID) | ORAL | Status: DC
  Administered 2019-03-12 (×2): 125 mg via ORAL
  Filled 2019-03-11 (×9): qty 2.5

## 2019-03-11 MED ORDER — ONDANSETRON HCL 4 MG/2ML IJ SOLN
4.0000 mg | Freq: Once | INTRAMUSCULAR | Status: AC
  Administered 2019-03-12: 4 mg via INTRAVENOUS
  Filled 2019-03-11: qty 2

## 2019-03-11 MED ORDER — SODIUM CHLORIDE 0.9 % IV BOLUS
1000.0000 mL | Freq: Once | INTRAVENOUS | Status: AC
  Administered 2019-03-11: 22:00:00 1000 mL via INTRAVENOUS

## 2019-03-11 MED ORDER — ACETAMINOPHEN 650 MG RE SUPP: 650.0000 mg | Freq: Four times a day (QID) | RECTAL | Status: DC | PRN

## 2019-03-11 MED ORDER — PROCHLORPERAZINE EDISYLATE 10 MG/2ML IJ SOLN: 5.0000 mg | INTRAMUSCULAR | Status: DC | PRN

## 2019-03-11 MED ORDER — FENTANYL CITRATE (PF) 100 MCG/2ML IJ SOLN
25.0000 ug | Freq: Once | INTRAMUSCULAR | Status: AC
  Administered 2019-03-12: 25 ug via INTRAVENOUS
  Filled 2019-03-11: qty 2

## 2019-03-11 NOTE — ED Triage Notes (Signed)
Pt presents to ED with complaints of diarrhea x 7 days.

## 2019-03-11 NOTE — ED Provider Notes (Signed)
[facility] Emergency Department Provider Note MRN:  [id number]  Arrival date & time: 03/11/19     Chief Complaint   Diarrhea   History of Present Illness   Rebecca Osborne is a 68 y.o. year-old female with a history of cervical cancer, small bowel obstruction status post colectomy presenting to the ED with chief complaint of diarrhea.  7 days of watery diarrhea.  Foul-smelling.  Associated with diffuse abdominal pain, worse in the lower quadrants, lower back cramping pain.  Denies fever, no nausea, no vomiting, no chest pain or shortness of breath, no dysuria.  Recent prescription for antibiotics a few weeks ago for UTI.  No blood in stool.  Review of Systems  A complete 10 system review of systems was obtained and all systems are negative except as noted in the HPI and PMH.   Patient's Health History    Past Medical History:  Diagnosis Date  . Allergy   . Cancer (HCC)    cervical cancer cells  . GERD (gastroesophageal reflux disease)   . Osteopenia     Past Surgical History:  Procedure Laterality Date  . ABDOMINAL HYSTERECTOMY    . BOWEL RESECTION N/A 06/22/2016   Procedure: SMALL BOWEL RESECTION;  Surgeon: Vickie Epley, MD;  Location: [facility];  Service: General;  Laterality: N/A;  . CHOLECYSTECTOMY  2005  . FRACTURE SURGERY     rt leg  . LAPAROTOMY N/A 06/22/2016   Procedure: EXPLORATORY LAPAROTOMY;  Surgeon: Vickie Epley, MD;  Location: [facility];  Service: General;  Laterality: N/A;  . LYSIS OF ADHESION N/A 06/22/2016   Procedure: LYSIS OF ADHESION;  Surgeon: Vickie Epley, MD;  Location: [facility];  Service: General;  Laterality: N/A;    Family History  Problem Relation Age of Onset  . COPD Mother   . Heart disease Mother   . Miscarriages / [place] Mother   . Vision loss Mother        from shingles  . COPD Father   . Cancer Brother        leukemia  . Early death Brother     Social History   Socioeconomic History  . Marital  status: Widowed    Spouse name: Not on file  . Number of children: Not on file  . Years of education: Not on file  . Highest education level: Not on file  Occupational History  . Not on file  Social Needs  . Financial resource strain: Not on file  . Food insecurity    Worry: Not on file    Inability: Not on file  . Transportation needs    Medical: Not on file    Non-medical: Not on file  Tobacco Use  . Smoking status: Never Smoker  . Smokeless tobacco: Never Used  Substance and Sexual Activity  . Alcohol use: No  . Drug use: No  . Sexual activity: Not Currently  Lifestyle  . Physical activity    Days per week: Not on file    Minutes per session: Not on file  . Stress: Not on file  Relationships  . Social Herbalist on phone: Not on file    Gets together: Not on file    Attends religious service: Not on file    Active member of club or organization: Not on file    Attends meetings of clubs or organizations: Not on file    Relationship status: Not on file  . Intimate  partner violence    Fear of current or ex partner: Not on file    Emotionally abused: Not on file    Physically abused: Not on file    Forced sexual activity: Not on file  Other Topics Concern  . Not on file  Social History Narrative  . Not on file     Physical Exam  Vital Signs and Nursing Notes reviewed Vitals:   03/11/19 1623 03/11/19 2301  BP: 120/64 (!) 117/53  Pulse: 90 90  Resp: 17 16  Temp: 98 F (36.7 C)   SpO2: 99% 99%    CONSTITUTIONAL: Well-appearing, NAD NEURO:  Alert and oriented x 3, no focal deficits EYES:  eyes equal and reactive ENT/NECK:  no LAD, no JVD CARDIO: Regular rate, well-perfused, normal S1 and S2 PULM:  CTAB no wheezing or rhonchi GI/GU:  normal bowel sounds, non-distended, mild tenderness to palpation to the right and left lower quadrants MSK/SPINE:  No gross deformities, no edema SKIN:  no rash, atraumatic PSYCH:  Appropriate speech and behavior   Diagnostic and Interventional Summary    Labs Reviewed  COMPREHENSIVE METABOLIC PANEL - Abnormal; Notable for the following components:      Result Value   CO2 21 (*)    Glucose, Bld 108 (*)    BUN 24 (*)    AST 13 (*)    All other components within normal limits  CBC - Abnormal; Notable for the following components:   WBC 13.0 (*)    RBC 3.68 (*)    Hemoglobin 10.8 (*)    HCT 34.6 (*)    All other components within normal limits  C DIFFICILE QUICK SCREEN W PCR REFLEX  SARS CORONAVIRUS 2 (HOSPITAL ORDER, [facility])  LIPASE, BLOOD  URINALYSIS, ROUTINE W REFLEX MICROSCOPIC    CT ABDOMEN PELVIS W CONTRAST  Final Result      Medications  fentaNYL (SUBLIMAZE) injection 25 mcg (has no administration in time range)  ondansetron (ZOFRAN) injection 4 mg (has no administration in time range)  vancomycin (VANCOCIN) 50 mg/mL oral solution 125 mg (has no administration in time range)  0.9 %  sodium chloride infusion (has no administration in time range)  sodium chloride 0.9 % bolus 1,000 mL (0 mLs Intravenous Stopped 03/11/19 2311)  fentaNYL (SUBLIMAZE) injection 50 mcg (50 mcg Intravenous Given 03/11/19 2207)  iohexol (OMNIPAQUE) 300 MG/ML solution 100 mL (100 mLs Intravenous Contrast Given 03/11/19 2222)     Procedures Critical Care  ED Course and Medical Decision Making  I have reviewed the triage vital signs and the nursing notes.  Pertinent labs & imaging results that were available during my care of the patient were reviewed by me and considered in my medical decision making (see below for details).  Considering C. difficile colitis, recurrent SBO, CT is pending.  CT reveals pancolitis.  Continued concern for C. difficile infection.  Patient continues to have nausea and discomfort.  Admitted to hospital service for further care.  Barth Kirks. Sedonia Small, MD [place] [email or web address]  Final Clinical  Impressions(s) / ED Diagnoses     ICD-10-CM   1. Pancolitis ([place])  K51.00   2. Diarrhea of presumed infectious origin  R19.7     ED Discharge Orders    None      Discharge Instructions Discussed with and Provided to Patient: Discharge Instructions   None       Maudie Flakes, MD 03/11/19 2327

## 2019-03-11 NOTE — H&P (Signed)
History and Physical    Rebecca Osborne YKD:[id number] DOB: 07-Mar-1951 DOA: 03/11/2019  PCP: Susy Frizzle, MD   Patient coming from: Home.  I have personally briefly reviewed patient's old medical records in [place]  Chief Complaint: Diarrhea for 7 days.  HPI: Rebecca Osborne is a 68 y.o. female with medical history significant of seasonal allergies, unspecified great cervical cancer, GERD, osteopenia who is coming to the emergency department complaints of diarrhea and abdominal pain for the past 7 days.  She denies nausea, vomiting, fever, chills, but states her appetite is decreased.  She denies travel history or sick contacts.  She was taking Keflex last month and suspects this may have been the cause for her loose stools.  She denies headache, sore throat, chest pain, palpitations, diaphoresis, but has felt lightheaded.  No PND, orthopnea or pitting edema of the lower extremities.  She states she has some dysuria and her urine looks concentrated.  She denies polyuria, polydipsia, polyphagia or blurred vision.  ED Course: Initial vital signs temperature 98 F, pulse 90, respirations 17, blood pressure 120/64 mmHg and O2 sat 99% on room air.  Patient received a 1 L NS bolus, Zofran 4 mg IVP x1, fentanyl 50 mcg and then 21 mcg IVP.  She also received 125 mg of vancomycin p.o. pending C. difficile testing on stool.  Her white count is 13.0, hemoglobin 10.8 g/dL and platelets 293.  CMP shows CO2 level of 21 mmol/L.  All other electrolytes are within normal limits.  Glucose 109, BUN 24 and creatinine 0.91 mg/dL.  Her LFTs are unremarkable.  Imaging: CT abdomen pelvis with contrast showed diffuse wall thickening and mucosal enhancement of the entire colon consistent with pancolitis.  It was negative for bowel obstruction or perforation.  She is status post cholecystectomy with stable enlargement of the extrahepatic bile duct.  Please see images and full radiology report for further detail.  Review of Systems: As per HPI otherwise 10 point review of systems negative.   Past Medical History:  Diagnosis Date  . Allergy   . Cancer (HCC)    cervical cancer cells  . GERD (gastroesophageal reflux disease)   . Osteopenia     Past Surgical History:  Procedure Laterality Date  . ABDOMINAL HYSTERECTOMY    . BOWEL RESECTION N/A 06/22/2016   Procedure: SMALL BOWEL RESECTION;  Surgeon: Vickie Epley, MD;  Location: [facility];  Service: General;  Laterality: N/A;  . CHOLECYSTECTOMY  2005  . FRACTURE SURGERY     rt leg  . LAPAROTOMY N/A 06/22/2016   Procedure: EXPLORATORY LAPAROTOMY;  Surgeon: Vickie Epley, MD;  Location: [facility];  Service: General;  Laterality: N/A;  . LYSIS OF ADHESION N/A 06/22/2016   Procedure: LYSIS OF ADHESION;  Surgeon: Vickie Epley, MD;  Location: [facility];  Service: General;  Laterality: N/A;     reports that she has never smoked. She has never used smokeless tobacco. She reports that she does not drink alcohol or use drugs.  Allergies  Allergen Reactions  . Codeine Itching    Family History  Problem Relation Age of Onset  . COPD Mother   . Heart disease Mother   . Miscarriages / [place] Mother   . Vision loss Mother        from shingles  . COPD Father   . Cancer Brother        leukemia  . Early death Brother    Prior to Admission medications  Medication Sig Start Date End Date Taking? Authorizing Provider  DULoxetine (CYMBALTA) 30 MG capsule Take 1 capsule (30 mg total) by mouth daily. 01/10/19   Trula Slade, DPM  ibuprofen (ADVIL) 600 MG tablet Take 1 tablet (600 mg total) by mouth every 8 (eight) hours as needed. 01/10/19   Trula Slade, DPM  lidocaine (XYLOCAINE) 5 % ointment Apply 1 application topically as needed. 10/22/18   Jamse Arn, MD  Vitamin D, Ergocalciferol, (DRISDOL) 1.25 MG (50000 UT) CAPS capsule Take 1 capsule (50,000 Units total) by mouth every 7 (seven) days. 01/29/19   Trula Slade, DPM     Physical Exam: Vitals:   03/11/19 1623 03/11/19 2301  BP: 120/64 (!) 117/53  Pulse: 90 90  Resp: 17 16  Temp: 98 F (36.7 C)   TempSrc: Oral   SpO2: 99% 99%  Weight: 54.4 kg   Height: 5' 1"  (1.549 m)     Constitutional: Looks ill, but currently in NAD, calm, comfortable Eyes: PERRL, lids and conjunctivae mildly injected. ENMT: Mucous membranes and lips are dry. Posterior pharynx clear of any exudate or lesions. Neck: normal, supple, no masses, no thyromegaly Respiratory: clear to auscultation bilaterally, no wheezing, no crackles. Normal respiratory effort. No accessory muscle use.  Cardiovascular: Regular rate and rhythm, no murmurs / rubs / gallops. No extremity edema. 2+ pedal pulses. No carotid bruits.  Abdomen: Nondistended. Bowel sounds positive.  Soft, mild diffuse tenderness, no guarding or rebound, no masses palpated. No hepatosplenomegaly.  Musculoskeletal: no clubbing / cyanosis. Good ROM, no contractures. Normal muscle tone.  Skin: no rashes, lesions, ulcers on limited dermatological examination. Neurologic: CN 2-12 grossly intact. Sensation intact, DTR normal. Strength 5/5 in all 4.  Psychiatric: Normal judgment and insight. Alert and oriented x 3. Normal mood.   Labs on Admission: I have personally reviewed following labs and imaging studies  CBC: Recent Labs  Lab 03/11/19 1648  WBC 13.0*  HGB 10.8*  HCT 34.6*  MCV 94.0  PLT 161   Basic Metabolic Panel: Recent Labs  Lab 03/11/19 1648  NA 141  K 3.8  CL 111  CO2 21*  GLUCOSE 108*  BUN 24*  CREATININE 0.91  CALCIUM 9.2   GFR: Estimated Creatinine Clearance: 44.6 mL/min (by C-G formula based on SCr of 0.91 mg/dL). Liver Function Tests: Recent Labs  Lab 03/11/19 1648  AST 13*  ALT 12  ALKPHOS 78  BILITOT 0.3  PROT 7.3  ALBUMIN 3.8   Recent Labs  Lab 03/11/19 1648  LIPASE 27   No results for input(s): AMMONIA in the last 168 hours. Coagulation Profile: No results for input(s): INR,  PROTIME in the last 168 hours. Cardiac Enzymes: No results for input(s): CKTOTAL, CKMB, CKMBINDEX, TROPONINI in the last 168 hours. BNP (last 3 results) No results for input(s): PROBNP in the last 8760 hours. HbA1C: No results for input(s): HGBA1C in the last 72 hours. CBG: No results for input(s): GLUCAP in the last 168 hours. Lipid Profile: No results for input(s): CHOL, HDL, LDLCALC, TRIG, CHOLHDL, LDLDIRECT in the last 72 hours. Thyroid Function Tests: No results for input(s): TSH, T4TOTAL, FREET4, T3FREE, THYROIDAB in the last 72 hours. Anemia Panel: No results for input(s): VITAMINB12, FOLATE, FERRITIN, TIBC, IRON, RETICCTPCT in the last 72 hours. Urine analysis:    Component Value Date/Time   COLORURINE YELLOW 09/07/2016 1305   APPEARANCEUR HAZY (A) 09/07/2016 1305   LABSPEC 1.020 02/21/2019 1940   PHURINE 5.5 02/21/2019 1940   GLUCOSEU NEGATIVE  02/21/2019 1940   HGBUR MODERATE (A) 02/21/2019 1940   BILIRUBINUR SMALL (A) 02/21/2019 1940   KETONESUR 40 (A) 02/21/2019 1940   PROTEINUR 100 (A) 02/21/2019 1940   UROBILINOGEN 0.2 02/21/2019 1940   NITRITE NEGATIVE 02/21/2019 1940   LEUKOCYTESUR SMALL (A) 02/21/2019 1940    Radiological Exams on Admission: Ct Abdomen Pelvis W Contrast  Result Date: 03/11/2019 CLINICAL DATA:  Lower abdominal pain with diarrhea history of bowel resection EXAM: CT ABDOMEN AND PELVIS WITH CONTRAST TECHNIQUE: Multidetector CT imaging of the abdomen and pelvis was performed using the standard protocol following bolus administration of intravenous contrast. CONTRAST:  185m OMNIPAQUE IOHEXOL 300 MG/ML  SOLN COMPARISON:  CT 08/17/2018, 09/07/2016 FINDINGS: Lower chest: Lung bases demonstrate no acute consolidation or effusion. The heart size is normal. Small hiatal hernia Hepatobiliary: Probable focal fat infiltration near the inferior falciform ligament. Status post cholecystectomy. Stable extrahepatic biliary dilatation with common duct dilated up to 12  mm. Pancreas: Unremarkable. No pancreatic ductal dilatation or surrounding inflammatory changes. Spleen: Normal in size without focal abnormality. Adrenals/Urinary Tract: Adrenal glands are normal. Cyst upper pole left kidney. Cortical scarring at the upper poles of both kidneys. No hydronephrosis. Urinary bladder is unremarkable. Stomach/Bowel: The stomach is nonenlarged. No dilated small bowel. Diffuse wall thickening and mucosal enhancement of the entire colon. Appendix not well seen but no right lower quadrant inflammatory process. Postsurgical changes within the pelvis. Vascular/Lymphatic: Moderate aortic atherosclerosis. No aneurysm. Small right lower quadrant mesenteric nodes. Reproductive: Status post hysterectomy. No adnexal masses. Other: Negative for free air or free fluid Musculoskeletal: No acute or suspicious osseous abnormality IMPRESSION: 1. Diffuse wall thickening and mucosal enhancement of the entire colon consistent with pancolitis, which may be secondary to infection to include C difficile colitis, inflammatory bowel disease, or less likely ischemic disease. Negative for bowel obstruction or perforation. 2. Status post cholecystectomy with stable enlargement of the extrahepatic bile duct Electronically Signed   By: KDonavan FoilM.D.   On: 03/11/2019 22:58    EKG: Independently reviewed.   Assessment/Plan Principal Problem:   Pancolitis (HCC) Observation/telemetry. Continue IV fluids. Clear liquid diets. Antiemetics as needed. Analgesics as needed. Famotidine 20 mg IVP x1. Follow-up C. difficile colitis test.  Active Problems:   GERD Famotidine 20 mg IVP x1 given.    Normocytic anemia Had a low normal B12 level earlier this year. Check anemia panel in a.m. Monitor H&H.   DVT prophylaxis: SCDs. Code Status: Full code. Family Communication: Disposition Plan: Observation for IV hydration, symptoms treatment and further work-up. Consults called: Admission status:  Observation/MedSurg.   DReubin MilanMD [facility]  If 7PM-7AM, please contact night-coverage [email or web address]  03/11/2019, 11:26 PM   This document was created using Dragon voice recognition software and may contain some unintended transcription errors.

## 2019-03-12 ENCOUNTER — Encounter (HOSPITAL_COMMUNITY): Payer: Self-pay

## 2019-03-12 DIAGNOSIS — Z8719 Personal history of other diseases of the digestive system: Secondary | ICD-10-CM | POA: Diagnosis not present

## 2019-03-12 DIAGNOSIS — M25572 Pain in left ankle and joints of left foot: Secondary | ICD-10-CM | POA: Diagnosis present

## 2019-03-12 DIAGNOSIS — Z885 Allergy status to narcotic agent status: Secondary | ICD-10-CM | POA: Diagnosis not present

## 2019-03-12 DIAGNOSIS — D519 Vitamin B12 deficiency anemia, unspecified: Secondary | ICD-10-CM | POA: Diagnosis present

## 2019-03-12 DIAGNOSIS — E8809 Other disorders of plasma-protein metabolism, not elsewhere classified: Secondary | ICD-10-CM | POA: Diagnosis present

## 2019-03-12 DIAGNOSIS — D649 Anemia, unspecified: Secondary | ICD-10-CM | POA: Diagnosis not present

## 2019-03-12 DIAGNOSIS — Z8781 Personal history of (healed) traumatic fracture: Secondary | ICD-10-CM | POA: Diagnosis not present

## 2019-03-12 DIAGNOSIS — K5100 Ulcerative (chronic) pancolitis without complications: Secondary | ICD-10-CM

## 2019-03-12 DIAGNOSIS — Z90710 Acquired absence of both cervix and uterus: Secondary | ICD-10-CM | POA: Diagnosis not present

## 2019-03-12 DIAGNOSIS — Z8541 Personal history of malignant neoplasm of cervix uteri: Secondary | ICD-10-CM | POA: Diagnosis not present

## 2019-03-12 DIAGNOSIS — Z806 Family history of leukemia: Secondary | ICD-10-CM | POA: Diagnosis not present

## 2019-03-12 DIAGNOSIS — R197 Diarrhea, unspecified: Secondary | ICD-10-CM | POA: Diagnosis not present

## 2019-03-12 DIAGNOSIS — A0472 Enterocolitis due to Clostridium difficile, not specified as recurrent: Secondary | ICD-10-CM | POA: Diagnosis present

## 2019-03-12 DIAGNOSIS — Z8619 Personal history of other infectious and parasitic diseases: Secondary | ICD-10-CM | POA: Diagnosis not present

## 2019-03-12 DIAGNOSIS — Z79899 Other long term (current) drug therapy: Secondary | ICD-10-CM | POA: Diagnosis not present

## 2019-03-12 DIAGNOSIS — M79671 Pain in right foot: Secondary | ICD-10-CM | POA: Diagnosis present

## 2019-03-12 DIAGNOSIS — Z87440 Personal history of urinary (tract) infections: Secondary | ICD-10-CM | POA: Diagnosis not present

## 2019-03-12 DIAGNOSIS — M8580 Other specified disorders of bone density and structure, unspecified site: Secondary | ICD-10-CM | POA: Diagnosis present

## 2019-03-12 DIAGNOSIS — Z9049 Acquired absence of other specified parts of digestive tract: Secondary | ICD-10-CM | POA: Diagnosis not present

## 2019-03-12 DIAGNOSIS — Z20828 Contact with and (suspected) exposure to other viral communicable diseases: Secondary | ICD-10-CM | POA: Diagnosis present

## 2019-03-12 DIAGNOSIS — E876 Hypokalemia: Secondary | ICD-10-CM | POA: Diagnosis present

## 2019-03-12 DIAGNOSIS — K219 Gastro-esophageal reflux disease without esophagitis: Secondary | ICD-10-CM | POA: Diagnosis not present

## 2019-03-12 LAB — FOLATE: Folate: 15.2 ng/mL (ref >5.9)

## 2019-03-12 LAB — COMPREHENSIVE METABOLIC PANEL
ALT: 10 U/L (ref 0–44)
AST: 11 U/L — ABNORMAL LOW (ref 15–41)
Albumin: 3 g/dL — ABNORMAL LOW (ref 3.5–5.0)
Alkaline Phosphatase: 66 U/L (ref 38–126)
Anion gap: 8 (ref 5–15)
BUN: 16 mg/dL (ref 8–23)
CO2: 19 mmol/L — ABNORMAL LOW (ref 22–32)
Calcium: 8.3 mg/dL — ABNORMAL LOW (ref 8.9–10.3)
Chloride: 111 mmol/L (ref 98–111)
Creatinine, Ser: 0.72 mg/dL (ref 0.44–1.00)
GFR calc Af Amer: >60 mL/min (ref >60)
GFR calc non Af Amer: >60 mL/min (ref >60)
Glucose, Bld: 93 mg/dL (ref 70–99)
Potassium: 3.4 mmol/L — ABNORMAL LOW (ref 3.5–5.1)
Sodium: 138 mmol/L (ref 135–145)
Total Bilirubin: 0.5 mg/dL (ref 0.3–1.2)
Total Protein: 5.8 g/dL — ABNORMAL LOW (ref 6.5–8.1)

## 2019-03-12 LAB — CBC WITH DIFFERENTIAL/PLATELET
Abs Immature Granulocytes: 0.04 10*3/uL (ref 0.00–0.07)
Basophils Absolute: 0 10*3/uL (ref 0.0–0.1)
Basophils Relative: 0 %
Eosinophils Absolute: 0.1 10*3/uL (ref 0.0–0.5)
Eosinophils Relative: 0 %
HCT: 30.5 % — ABNORMAL LOW (ref 36.0–46.0)
Hemoglobin: 9.6 g/dL — ABNORMAL LOW (ref 12.0–15.0)
Immature Granulocytes: 0 %
Lymphocytes Relative: 12 %
Lymphs Abs: 1.3 10*3/uL (ref 0.7–4.0)
MCH: 30 pg (ref 26.0–34.0)
MCHC: 31.5 g/dL (ref 30.0–36.0)
MCV: 95.3 fL (ref 80.0–100.0)
Monocytes Absolute: 0.9 10*3/uL (ref 0.1–1.0)
Monocytes Relative: 8 %
Neutro Abs: 8.8 10*3/uL — ABNORMAL HIGH (ref 1.7–7.7)
Neutrophils Relative %: 80 %
Platelets: 252 10*3/uL (ref 150–400)
RBC: 3.2 MIL/uL — ABNORMAL LOW (ref 3.87–5.11)
RDW: 13.1 % (ref 11.5–15.5)
WBC: 11.2 10*3/uL — ABNORMAL HIGH (ref 4.0–10.5)
nRBC: 0 % (ref 0.0–0.2)

## 2019-03-12 LAB — URINALYSIS, ROUTINE W REFLEX MICROSCOPIC
Bilirubin Urine: NEGATIVE
Glucose, UA: NEGATIVE mg/dL
Hgb urine dipstick: NEGATIVE
Ketones, ur: 5 mg/dL — AB
Leukocytes,Ua: NEGATIVE
Nitrite: NEGATIVE
Protein, ur: NEGATIVE mg/dL
Specific Gravity, Urine: 1.033 — ABNORMAL HIGH (ref 1.005–1.030)
pH: 5 (ref 5.0–8.0)

## 2019-03-12 LAB — C DIFFICILE QUICK SCREEN W PCR REFLEX
C Diff antigen: NEGATIVE
C Diff interpretation: NOT DETECTED
C Diff toxin: NEGATIVE

## 2019-03-12 LAB — IRON AND TIBC
Iron: 42 ug/dL (ref 28–170)
Saturation Ratios: 18 % (ref 10.4–31.8)
TIBC: 232 ug/dL — ABNORMAL LOW (ref 250–450)
UIBC: 190 ug/dL

## 2019-03-12 LAB — RETICULOCYTES
Immature Retic Fract: 6 % (ref 2.3–15.9)
RBC.: 3.2 MIL/uL — ABNORMAL LOW (ref 3.87–5.11)
Retic Count, Absolute: 38.7 10*3/uL (ref 19.0–186.0)
Retic Ct Pct: 1.2 % (ref 0.4–3.1)

## 2019-03-12 LAB — MRSA PCR SCREENING: MRSA by PCR: NEGATIVE

## 2019-03-12 LAB — MAGNESIUM: Magnesium: 1.6 mg/dL — ABNORMAL LOW (ref 1.7–2.4)

## 2019-03-12 LAB — FERRITIN: Ferritin: 63 ng/mL (ref 11–307)

## 2019-03-12 LAB — HIV ANTIBODY (ROUTINE TESTING W REFLEX): HIV Screen 4th Generation wRfx: NONREACTIVE

## 2019-03-12 LAB — SARS CORONAVIRUS 2 BY RT PCR (HOSPITAL ORDER, PERFORMED IN ~~LOC~~ HOSPITAL LAB): SARS Coronavirus 2: NEGATIVE

## 2019-03-12 LAB — VITAMIN B12: Vitamin B-12: 175 pg/mL — ABNORMAL LOW (ref 180–914)

## 2019-03-12 MED ORDER — FENTANYL CITRATE (PF) 100 MCG/2ML IJ SOLN
25.0000 ug | INTRAMUSCULAR | Status: DC | PRN
  Administered 2019-03-12 (×2): 50 ug via INTRAVENOUS
  Filled 2019-03-12 (×2): qty 2

## 2019-03-12 MED ORDER — ONDANSETRON HCL 4 MG/2ML IJ SOLN
4.0000 mg | Freq: Three times a day (TID) | INTRAMUSCULAR | Status: DC
  Administered 2019-03-12 – 2019-03-16 (×16): 4 mg via INTRAVENOUS
  Filled 2019-03-12 (×16): qty 2

## 2019-03-12 MED ORDER — POTASSIUM CHLORIDE 10 MEQ/100ML IV SOLN
10.0000 meq | INTRAVENOUS | Status: AC
  Administered 2019-03-12 (×3): 10 meq via INTRAVENOUS
  Filled 2019-03-12 (×3): qty 100

## 2019-03-12 MED ORDER — CHLORHEXIDINE GLUCONATE CLOTH 2 % EX PADS
6.0000 | MEDICATED_PAD | Freq: Every day | CUTANEOUS | Status: DC
  Administered 2019-03-12 – 2019-03-13 (×2): 6 via TOPICAL

## 2019-03-12 MED ORDER — INFLUENZA VAC A&B SA ADJ QUAD 0.5 ML IM PRSY
0.5000 mL | PREFILLED_SYRINGE | INTRAMUSCULAR | Status: DC
  Filled 2019-03-12: qty 0.5

## 2019-03-12 MED ORDER — MAGNESIUM SULFATE 2 GM/50ML IV SOLN
2.0000 g | Freq: Once | INTRAVENOUS | Status: AC
  Administered 2019-03-12: 14:00:00 2 g via INTRAVENOUS
  Filled 2019-03-12: qty 50

## 2019-03-12 MED ORDER — FAMOTIDINE IN NACL 20-0.9 MG/50ML-% IV SOLN
20.0000 mg | Freq: Once | INTRAVENOUS | Status: AC
  Administered 2019-03-12: 04:00:00 20 mg via INTRAVENOUS
  Filled 2019-03-12: qty 50

## 2019-03-12 MED ORDER — SODIUM CHLORIDE 0.9 % IV BOLUS
1000.0000 mL | Freq: Once | INTRAVENOUS | Status: AC
  Administered 2019-03-12: 07:00:00 1000 mL via INTRAVENOUS

## 2019-03-12 MED ORDER — MORPHINE SULFATE (PF) 2 MG/ML IV SOLN
1.0000 mg | Freq: Four times a day (QID) | INTRAVENOUS | Status: DC
  Administered 2019-03-12 – 2019-03-14 (×7): 1 mg via INTRAVENOUS
  Filled 2019-03-12 (×7): qty 1

## 2019-03-12 MED ORDER — FENTANYL CITRATE (PF) 100 MCG/2ML IJ SOLN
50.0000 ug | Freq: Once | INTRAMUSCULAR | Status: AC
  Administered 2019-03-12: 50 ug via INTRAVENOUS
  Filled 2019-03-12: qty 2

## 2019-03-12 NOTE — Progress Notes (Addendum)
PROGRESS NOTE  Rebecca Osborne  NWG:[id number]  DOB: September 28, 1950  DOA: 03/11/2019 PCP: Susy Frizzle, MD  Brief Admission Hx: 68 y.o. female with medical history significant of seasonal allergies, unspecified great cervical cancer, GERD, osteopenia who is coming to the emergency department complaints of diarrhea and abdominal pain for the past 7 days.  She had completed a course of cephalexin for a UTI recently and there was concern for C diff but the test was negative.  She continues to have diarrhea concerning for an infectious cause.    MDM/Assessment & Plan:   1. Pancolitis - C diff testing was negative, she continues to have abdominal pain and watery diarrhea, GI planning GI pathogen panel.  Continue supportive care for now.   She remains on clear liquid diet.   2. Normocytic anemia - we have asked for an anemia panel to be tested.  3. Nonspecific abdominal pain - IV fentanyl is helping to control the symptoms.   4. GERD - pepcid was given IV on admission.  5. Hypomagnesemia - IV replacement ordered, recheck in AM.   DVT prophylaxis: SCDs Code Status: Full  Family Communication: patient updated at bedside, verbalized understanding Disposition Plan: inpatient for IV fluids, supportive care and subspecialty consultation   Consultants:  GI  Procedures:    Antimicrobials:  Oral vancomycin 10/5-10/6   Subjective: Pt reports continued abdominal pain and diarrhea but no emesis.    Objective: Vitals:   03/11/19 2301 03/12/19 0230 03/12/19 0300 03/12/19 0355  BP: (!) 117/53 132/63 139/72 100/75  Pulse: 90 88 81 83  Resp: 16     Temp:    98.1 F (36.7 C)  TempSrc:    Oral  SpO2: 99% 100% 100% 100%  Weight:      Height:        Intake/Output Summary (Last 24 hours) at 03/12/2019 1342 Last data filed at 03/12/2019 0800 Gross per 24 hour  Intake 1725.78 ml  Output -  Net 1725.78 ml   Filed Weights   03/11/19 1623  Weight: 54.4 kg   REVIEW OF SYSTEMS  As per  history otherwise all reviewed and reported negative  Exam:  General exam: thin female, lying in bed awake, alert, mildly distressed.  Respiratory system: Clear. No increased work of breathing. Cardiovascular system: S1 & S2 heard. No JVD, murmurs, gallops, clicks or pedal edema. Gastrointestinal system: Abdomen is nondistended, soft and LLQ, RLQ tenderness. Normal bowel sounds heard. Central nervous system: Alert and oriented. No focal neurological deficits. Extremities: no CCE.  Data Reviewed: Basic Metabolic Panel: Recent Labs  Lab 03/11/19 1648 03/12/19 0320 03/12/19 0344  NA 141 138  --   K 3.8 3.4*  --   CL 111 111  --   CO2 21* 19*  --   GLUCOSE 108* 93  --   BUN 24* 16  --   CREATININE 0.91 0.72  --   CALCIUM 9.2 8.3*  --   MG  --   --  1.6*   Liver Function Tests: Recent Labs  Lab 03/11/19 1648 03/12/19 0320  AST 13* 11*  ALT 12 10  ALKPHOS 78 66  BILITOT 0.3 0.5  PROT 7.3 5.8*  ALBUMIN 3.8 3.0*   Recent Labs  Lab 03/11/19 1648  LIPASE 27   No results for input(s): AMMONIA in the last 168 hours. CBC: Recent Labs  Lab 03/11/19 1648 03/12/19 0320  WBC 13.0* 11.2*  NEUTROABS  --  8.8*  HGB 10.8* 9.6*  HCT  34.6* 30.5*  MCV 94.0 95.3  PLT 293 252   Cardiac Enzymes: No results for input(s): CKTOTAL, CKMB, CKMBINDEX, TROPONINI in the last 168 hours. CBG (last 3)  No results for input(s): GLUCAP in the last 72 hours. Recent Results (from the past 240 hour(s))  C difficile quick scan w PCR reflex     Status: None   Collection Time: 03/11/19  9:22 PM   Specimen: STOOL  Result Value Ref Range Status   C Diff antigen NEGATIVE NEGATIVE Final   C Diff toxin NEGATIVE NEGATIVE Final   C Diff interpretation No C. difficile detected.  Final    Comment: Performed at [facility], [address] [place], [place] [address]  SARS Coronavirus 2 [facility] order, Performed in [facility] hospital lab) Nasopharyngeal Nasopharyngeal Swab     Status: None    Collection Time: 03/12/19  2:18 AM   Specimen: Nasopharyngeal Swab  Result Value Ref Range Status   SARS Coronavirus 2 NEGATIVE NEGATIVE Final    Comment: (NOTE) If result is NEGATIVE SARS-CoV-2 target nucleic acids are NOT DETECTED. The SARS-CoV-2 RNA is generally detectable in upper and lower  respiratory specimens during the acute phase of infection. The lowest  concentration of SARS-CoV-2 viral copies this assay can detect is 250  copies / mL. A negative result does not preclude SARS-CoV-2 infection  and should not be used as the sole basis for treatment or other  patient management decisions.  A negative result may occur with  improper specimen collection / handling, submission of specimen other  than nasopharyngeal swab, presence of viral mutation(s) within the  areas targeted by this assay, and inadequate number of viral copies  (<250 copies / mL). A negative result must be combined with clinical  observations, patient history, and epidemiological information. If result is POSITIVE SARS-CoV-2 target nucleic acids are DETECTED. The SARS-CoV-2 RNA is generally detectable in upper and lower  respiratory specimens dur ing the acute phase of infection.  Positive  results are indicative of active infection with SARS-CoV-2.  Clinical  correlation with patient history and other diagnostic information is  necessary to determine patient infection status.  Positive results do  not rule out bacterial infection or co-infection with other viruses. If result is PRESUMPTIVE POSTIVE SARS-CoV-2 nucleic acids MAY BE PRESENT.   A presumptive positive result was obtained on the submitted specimen  and confirmed on repeat testing.  While 2019 novel coronavirus  (SARS-CoV-2) nucleic acids may be present in the submitted sample  additional confirmatory testing may be necessary for epidemiological  and / or clinical management purposes  to differentiate between  SARS-CoV-2 and other Sarbecovirus  currently known to infect humans.  If clinically indicated additional testing with an alternate test  methodology [phone]) is advised. The SARS-CoV-2 RNA is generally  detectable in upper and lower respiratory sp ecimens during the acute  phase of infection. The expected result is Negative. Fact Sheet for Patients:  StrictlyIdeas.no Fact Sheet for Healthcare Providers: BankingDealers.co.za This test is not yet approved or cleared by the [place] FDA and has been authorized for detection and/or diagnosis of SARS-CoV-2 by FDA under an Emergency Use Authorization (EUA).  This EUA will remain in effect (meaning this test can be used) for the duration of the COVID-19 declaration under Section 564(b)(1) of the Act, 21 U.S.C. section 360bbb-3(b)(1), unless the authorization is terminated or revoked sooner. Performed at [facility], [address], [place], [place] [address]   MRSA PCR Screening  Status: None   Collection Time: 03/12/19  3:56 AM   Specimen: Nasal Mucosa; Nasopharyngeal  Result Value Ref Range Status   MRSA by PCR NEGATIVE NEGATIVE Final    Comment:        The GeneXpert MRSA Assay (FDA approved for NASAL specimens only), is one component of a comprehensive MRSA colonization surveillance program. It is not intended to diagnose MRSA infection nor to guide or monitor treatment for MRSA infections. Performed at [facility] [address], [place], [place] [address]     Studies: Ct Abdomen Pelvis W Contrast  Result Date: 03/11/2019 CLINICAL DATA:  Lower abdominal pain with diarrhea history of bowel resection EXAM: CT ABDOMEN AND PELVIS WITH CONTRAST TECHNIQUE: Multidetector CT imaging of the abdomen and pelvis was performed using the standard protocol following bolus administration of intravenous contrast. CONTRAST:  116m OMNIPAQUE IOHEXOL 300 MG/ML  SOLN COMPARISON:  CT 08/17/2018, 09/07/2016 FINDINGS: Lower chest:  Lung bases demonstrate no acute consolidation or effusion. The heart size is normal. Small hiatal hernia Hepatobiliary: Probable focal fat infiltration near the inferior falciform ligament. Status post cholecystectomy. Stable extrahepatic biliary dilatation with common duct dilated up to 12 mm. Pancreas: Unremarkable. No pancreatic ductal dilatation or surrounding inflammatory changes. Spleen: Normal in size without focal abnormality. Adrenals/Urinary Tract: Adrenal glands are normal. Cyst upper pole left kidney. Cortical scarring at the upper poles of both kidneys. No hydronephrosis. Urinary bladder is unremarkable. Stomach/Bowel: The stomach is nonenlarged. No dilated small bowel. Diffuse wall thickening and mucosal enhancement of the entire colon. Appendix not well seen but no right lower quadrant inflammatory process. Postsurgical changes within the pelvis. Vascular/Lymphatic: Moderate aortic atherosclerosis. No aneurysm. Small right lower quadrant mesenteric nodes. Reproductive: Status post hysterectomy. No adnexal masses. Other: Negative for free air or free fluid Musculoskeletal: No acute or suspicious osseous abnormality IMPRESSION: 1. Diffuse wall thickening and mucosal enhancement of the entire colon consistent with pancolitis, which may be secondary to infection to include C difficile colitis, inflammatory bowel disease, or less likely ischemic disease. Negative for bowel obstruction or perforation. 2. Status post cholecystectomy with stable enlargement of the extrahepatic bile duct Electronically Signed   By: KDonavan FoilM.D.   On: 03/11/2019 22:58   Scheduled Meds: . Chlorhexidine Gluconate Cloth  6 each Topical Daily  . [START ON 03/13/2019] influenza vaccine adjuvanted  0.5 mL Intramuscular Tomorrow-1000   Continuous Infusions: . sodium chloride 100 mL/hr at 03/12/19 1102  . magnesium sulfate bolus IVPB     Principal Problem:   Pancolitis (HCC) Active Problems:   GERD   Normocytic  anemia   Diarrhea of presumed infectious origin  Time spent:   CIrwin Brakeman MD [facility] 03/12/2019, 1:42 PM    LOS: 0 days  How to contact the [facility] HospitalAttending or Consulting provider [place] covering provider during after hours [place] for this patient?  1. Check the care team in [facility] look for a) attending/consulting [facility] provider listed and b) the [facility] Centerteam listed 2. Log into [email or web address] and use [place]'s universal password to access. If you do not have the password, please contact the hospital operator. 3. Locate the TCople[facility] Hospitalprovider you are looking for under [facility] and page to a number that you can be directly reached. 4. If you still have difficulty reaching the provider, please page the [facility](Director on Call) for the Hospitalists listed on amion for assistance.

## 2019-03-12 NOTE — Consult Note (Signed)
Referring Provider: [facility] Primary Care Physician:  Susy Frizzle, MD Primary Gastroenterologist:  Dr. Oneida Alar  Date of Admission: 03/11/19 Date of Consultation: 03/12/19  Reason for Consultation:  Pancolitis  HPI:  Rebecca Osborne is a 68 y.o. year old female with past medical history significant for seasonal allergies, unspecified cervical cancer s/p total abdominal hysterectomy, GERD, H. Pylori in 2011, closed loop small bowel obstruction attributable to post-hysterectomy adhesions with resection of 38 cm of distal jejunum in 2018, and osteopenia who presented to the emergency department on 03/11/2019 with complaint of diarrhea and abdominal pain x7 days.   In the ED patient remained hemodynamically stable.  Labs with WBC 13.0, hemoglobin 10.8, platelets 293, electrolytes within normal limits, LFTs unremarkable, lipase normal. Glucose 109, BUN 24 and creatinine 0.91 mg/dL.  CT abdomen and pelvis with contrast revealed diffuse wall thickening with mucosal enhancement of the entire colon consistent with pancolitis, which may be secondary to infection, inflammatory bowel disease, or less likely ischemic disease.  Negative for obstruction or perforation.  Status post cholecystectomy with stable enlargement of the extrahepatic bile duct.  Patient was started on vancomycin 125 mg q6 hours, IV fluids, clear liquid diet, antiemetics as needed, analgesics as needed with plans to follow-up on C. difficile testing. Ultimately C. difficile was negative.   Last colonoscopy on 09/18/2009 with Dr. Oneida Alar.  Findings included normal colon without polyps, masses, inflammatory changes, or diverticular AVMs.  Small internal hemorrhoids.  Otherwise normal rectum.   Today she states she started hurting in stomach, side, and back with associated diarrhea 7 days ago. Pain diffuse, but worse in the lower abdomen. Stools are mushy to watery. Some "yellow stuff floating." Was having at most 10 BMs. Would wake up  3-4 times in the middle of the night. No blood in the stool or black stools. Abdominal pain 9/10. Abdominal pain is persistent. No improvement. Diarrhea has not improved. BMs with urgency. No nausea or vomiting. No sick contacts. No recent meals out to eat. No undercooked meats. No family history of ulcerative colitis or Crohn's disease. Prior to the start of this acute episode, stools were daily, soft, formed. Bristol 4. Was taking Keflex for urinary tract infection about 1 week ago. Last dose prior to onset of diarrhea, but not sure of the day. No well water. No contact with livestock.   No recent episodes of pre-syncope or syncope. Tolerating clear liquids well. Eating does not worsen her symptoms.   Denies fever, chills, lightheadedness, dizziness, no pre-syncope or syncope. Heartburn bad when eating hot peppers. Otherwise without symptoms. Will take tums as needed. No dysphagia. No CP, palpitations, SOB, or cough. Appetite was good prior to this acute episode. No unintentional weight loss.   Has been taking ibuprofen 600 mg daily for pain in her right foot for the last couple of months. No other NSAIDs.   Past Medical History:  Diagnosis Date  . Allergy   . Cancer (HCC)    cervical cancer cells  . GERD (gastroesophageal reflux disease)   . Osteopenia     Past Surgical History:  Procedure Laterality Date  . ABDOMINAL HYSTERECTOMY    . BOWEL RESECTION N/A 06/22/2016   Procedure: SMALL BOWEL RESECTION;  Surgeon: Vickie Epley, MD;  Location: [facility];  Service: General;  Laterality: N/A;  . CHOLECYSTECTOMY  2005  . FRACTURE SURGERY     rt leg  . LAPAROTOMY N/A 06/22/2016   Procedure: EXPLORATORY LAPAROTOMY;  Surgeon: Vickie Epley, MD;  Location: [facility];  Service: General;  Laterality: N/A;  . LYSIS OF ADHESION N/A 06/22/2016   Procedure: LYSIS OF ADHESION;  Surgeon: Vickie Epley, MD;  Location: [facility];  Service: General;  Laterality: N/A;    Prior to Admission medications    Medication Sig Start Date End Date Taking? Authorizing Provider  DULoxetine (CYMBALTA) 30 MG capsule Take 1 capsule (30 mg total) by mouth daily. 01/10/19   Trula Slade, DPM  ibuprofen (ADVIL) 600 MG tablet Take 1 tablet (600 mg total) by mouth every 8 (eight) hours as needed. 01/10/19   Trula Slade, DPM  lidocaine (XYLOCAINE) 5 % ointment Apply 1 application topically as needed. 10/22/18   Jamse Arn, MD  Vitamin D, Ergocalciferol, (DRISDOL) 1.25 MG (50000 UT) CAPS capsule Take 1 capsule (50,000 Units total) by mouth every 7 (seven) days. 01/29/19   Trula Slade, DPM    Current Facility-Administered Medications  Medication Dose Route Frequency Provider Last Rate Last Dose  . 0.9 %  sodium chloride infusion   Intravenous Continuous Reubin Milan, MD 100 mL/hr at 03/12/19 0010 100 mL/hr at 03/12/19 0010  . acetaminophen (TYLENOL) tablet 650 mg  650 mg Oral Q6H PRN Reubin Milan, MD   650 mg at 03/12/19 0410   Or  . acetaminophen (TYLENOL) suppository 650 mg  650 mg Rectal Q6H PRN Reubin Milan, MD      . Chlorhexidine Gluconate Cloth 2 % PADS 6 each  6 each Topical Daily Reubin Milan, MD      . Derrill Memo ON 03/13/2019] influenza vaccine adjuvanted (FLUAD) injection 0.5 mL  0.5 mL Intramuscular Tomorrow-1000 Reubin Milan, MD      . potassium chloride 10 mEq in 100 mL IVPB  10 mEq Intravenous Q1 Hr x 3 Johnson, Clanford L, MD      . prochlorperazine (COMPAZINE) injection 5 mg  5 mg Intravenous Q4H PRN Reubin Milan, MD      . vancomycin (VANCOCIN) 50 mg/mL oral solution 125 mg  125 mg Oral Q6H Reubin Milan, MD   125 mg at 03/12/19 0559    Allergies as of 03/11/2019 - Review Complete 03/11/2019  Allergen Reaction Noted  . Codeine Itching     Family History  Problem Relation Age of Onset  . COPD Mother   . Heart disease Mother   . Miscarriages / [place] Mother   . Vision loss Mother        from shingles  . COPD Father   .  Cancer Brother        leukemia  . Early death Brother     Social History   Socioeconomic History  . Marital status: Widowed    Spouse name: Not on file  . Number of children: Not on file  . Years of education: Not on file  . Highest education level: Not on file  Occupational History  . Not on file  Social Needs  . Financial resource strain: Not on file  . Food insecurity    Worry: Not on file    Inability: Not on file  . Transportation needs    Medical: Not on file    Non-medical: Not on file  Tobacco Use  . Smoking status: Never Smoker  . Smokeless tobacco: Never Used  Substance and Sexual Activity  . Alcohol use: No  . Drug use: No  . Sexual activity: Not Currently  Lifestyle  . Physical activity    Days  per week: Not on file    Minutes per session: Not on file  . Stress: Not on file  Relationships  . Social Herbalist on phone: Not on file    Gets together: Not on file    Attends religious service: Not on file    Active member of club or organization: Not on file    Attends meetings of clubs or organizations: Not on file    Relationship status: Not on file  . Intimate partner violence    Fear of current or ex partner: Not on file    Emotionally abused: Not on file    Physically abused: Not on file    Forced sexual activity: Not on file  Other Topics Concern  . Not on file  Social History Narrative  . Not on file    Review of Systems: Gen: See HPI CV: Denies peripheral edema  Resp: See HPI GI: See HPI  GU: Denies urinary burning, urinary frequency, urinary incontinence. States her urine is very yellow rather than pale to clear. MS: Admits to pain in her right foot secondary to history of toe fractures in 2019 and pain in left ankle from prior sprain. No other joint pain. Derm: Denies rash Heme: Denies bruising.  Physical Exam: Vital signs in last 24 hours: Temp:  [98 F (36.7 C)-98.1 F (36.7 C)] 98.1 F (36.7 C) (10/06 0355) Pulse  Rate:  [81-90] 83 (10/06 0355) Resp:  [16-17] 16 (10/05 2301) BP: (100-139)/(53-75) 100/75 (10/06 0355) SpO2:  [99 %-100 %] 100 % (10/06 0355) Weight:  [54.4 kg] 54.4 kg (10/05 1623) Last BM Date: 03/12/19 General:   Alert, ill appearing, complaining of abdominal pain.  Head:  Normocephalic and atraumatic. Eyes:  Sclera clear, no icterus.   Conjunctiva pink. Ears:  Normal auditory acuity. Nose:  No deformity, discharge,  or lesions. Lungs:  Clear throughout to auscultation.   No wheezes, crackles, or rhonchi. No acute distress. Heart:  Regular rate and rhythm; no murmurs, clicks, rubs,  or gallops. Abdomen:  Soft, and nondistended. Diffusely tender to light/moderate palpation. Greatest in the lower abdomen. Bowel sounds present. Somewhat hypoactive. No masses, hepatosplenomegaly or hernias noted.   Rectal:  Deferred.  Msk:  Symmetrical without gross deformities. Normal posture. Pulses:  Normal pulses noted. Extremities:  Without edema. Neurologic:  Alert and  oriented x4;  grossly normal neurologically. Skin:  Intact without significant lesions or rashes. Psych: Normal mood and affect.  Intake/Output from previous day: 10/05 0701 - 10/06 0700 In: 1059.1 [I.V.:0.9; IV Piggyback:1058.2] Out: -  Intake/Output this shift: No intake/output data recorded.  Lab Results: Recent Labs    03/11/19 1648 03/12/19 0320  WBC 13.0* 11.2*  HGB 10.8* 9.6*  HCT 34.6* 30.5*  PLT 293 252   BMET Recent Labs    03/11/19 1648 03/12/19 0320  NA 141 138  K 3.8 3.4*  CL 111 111  CO2 21* 19*  GLUCOSE 108* 93  BUN 24* 16  CREATININE 0.91 0.72  CALCIUM 9.2 8.3*   LFT Recent Labs    03/11/19 1648 03/12/19 0320  PROT 7.3 5.8*  ALBUMIN 3.8 3.0*  AST 13* 11*  ALT 12 10  ALKPHOS 78 66  BILITOT 0.3 0.5   PT/INR No results for input(s): LABPROT, INR in the last 72 hours. Hepatitis Panel No results for input(s): HEPBSAG, HCVAB, HEPAIGM, HEPBIGM in the last 72 hours. C-Diff Recent Labs     03/11/19 2122  CDIFFTOX NEGATIVE  Studies/Results: Ct Abdomen Pelvis W Contrast  Result Date: 03/11/2019 CLINICAL DATA:  Lower abdominal pain with diarrhea history of bowel resection EXAM: CT ABDOMEN AND PELVIS WITH CONTRAST TECHNIQUE: Multidetector CT imaging of the abdomen and pelvis was performed using the standard protocol following bolus administration of intravenous contrast. CONTRAST:  162m OMNIPAQUE IOHEXOL 300 MG/ML  SOLN COMPARISON:  CT 08/17/2018, 09/07/2016 FINDINGS: Lower chest: Lung bases demonstrate no acute consolidation or effusion. The heart size is normal. Small hiatal hernia Hepatobiliary: Probable focal fat infiltration near the inferior falciform ligament. Status post cholecystectomy. Stable extrahepatic biliary dilatation with common duct dilated up to 12 mm. Pancreas: Unremarkable. No pancreatic ductal dilatation or surrounding inflammatory changes. Spleen: Normal in size without focal abnormality. Adrenals/Urinary Tract: Adrenal glands are normal. Cyst upper pole left kidney. Cortical scarring at the upper poles of both kidneys. No hydronephrosis. Urinary bladder is unremarkable. Stomach/Bowel: The stomach is nonenlarged. No dilated small bowel. Diffuse wall thickening and mucosal enhancement of the entire colon. Appendix not well seen but no right lower quadrant inflammatory process. Postsurgical changes within the pelvis. Vascular/Lymphatic: Moderate aortic atherosclerosis. No aneurysm. Small right lower quadrant mesenteric nodes. Reproductive: Status post hysterectomy. No adnexal masses. Other: Negative for free air or free fluid Musculoskeletal: No acute or suspicious osseous abnormality IMPRESSION: 1. Diffuse wall thickening and mucosal enhancement of the entire colon consistent with pancolitis, which may be secondary to infection to include C difficile colitis, inflammatory bowel disease, or less likely ischemic disease. Negative for bowel obstruction or perforation. 2.  Status post cholecystectomy with stable enlargement of the extrahepatic bile duct Electronically Signed   By: KDonavan FoilM.D.   On: 03/11/2019 22:58    Impression: 68y.o. female with past medical history significant for cervical cancer s/p total abdominal hysterectomy, GERD, H. Pylori in 2011,  colonoscopy in 2011 with normal colon recommended repeat in 10 years, closed loop small bowel obstruction attributable to post-hysterectomy adhesions with resection of 38 cm of distal jejunum in 2018 who was admitted on 03/11/19 with pancolitis after presenting to the ED with abdominal pain and diarrhea x 7 days. Stools were daily and Bristol 4 prior to this. Patient has remained hemodynamically stable and afebrile. WBC 13.0, hemoglobin 10.8, platelets 293, electrolytes within normal limits, LFTs unremarkable, lipase normal.  CT abdomen and pelvis with contrast revealed diffuse wall thickening with mucosal enhancement of the entire colon consistent with pancolitis, which may be secondary to infection, inflammatory bowel disease, or less likely ischemic disease.  Negative for obstruction or perforation.  Status post cholecystectomy with stable enlargement of the extrahepatic bile duct.  Patient was started on vancomycin 125 mg q6 hours, IV fluids, clear liquid diet. C. Diff testing was negative. Vancomycin has been discontinued.  Patient continues with persistent 9/10 abdominal pain and diarrhea today (3 episodes of loose/watery stools). Denies bright red blood per rectum, melena, nausea, vomiting, episodes of pre-syncope or syncope. Denies rash or significant joint pain. Was on Keflex about 1 week ago for UTI and admits to taking Ibuprofen 600 mg daily for the last couple of months. No sick contacts. No well water. No family history of colon cancer, colon polyps, or IBD.   Symptoms most consistent with infectious colitis. Doubt inflammatory bowel disease as she is without other signs/symtpoms or family history of  IBD. Inflammation in the colon could also be influenced by NSAIDs. Do not think this is an ischemic process as she has no significant risk factors and no report of pre-syncope or syncope.  Will obtain GI pathogen panel to rule out other infectious diarrhea as C. Diff was negative. If no evidence of infection and symptoms persist, patient will likely need colonoscopy.   Normocytic anemia: No overt GI bleeding. Hemoglobin 10.8 on admission and 9.6 today with normocytic indices. Iron panel without evidence of iron deficiency. B12 low (175). Folate normal. Drop in hemoglobin likely dilutional. Will differ management to hospitalitis and continue to monitor for overt GI bleeding.   Hypokalemia and hypomagnesemia: Likely secondary to diarrhea. Continue with repletion per hospitalitis.   Plan: GI pathogen panel.  Avoid NSAIDs Ultimately, patient may need colonoscopy if GI pathogen panel is negative and she continues to have significant pain and diarrhea. Will discuss with Dr. Oneida Alar.  Continue clear liquid diet.  Continue supportive measures.    LOS: 0 days    03/12/2019, 7:43 AM   Aliene Altes, PA-C [facility]

## 2019-03-13 LAB — CBC WITH DIFFERENTIAL/PLATELET
Abs Immature Granulocytes: 0.06 10*3/uL (ref 0.00–0.07)
Basophils Absolute: 0 10*3/uL (ref 0.0–0.1)
Basophils Relative: 0 %
Eosinophils Absolute: 0.1 10*3/uL (ref 0.0–0.5)
Eosinophils Relative: 1 %
HCT: 28.2 % — ABNORMAL LOW (ref 36.0–46.0)
Hemoglobin: 8.7 g/dL — ABNORMAL LOW (ref 12.0–15.0)
Immature Granulocytes: 1 %
Lymphocytes Relative: 11 %
Lymphs Abs: 1.4 10*3/uL (ref 0.7–4.0)
MCH: 29.3 pg (ref 26.0–34.0)
MCHC: 30.9 g/dL (ref 30.0–36.0)
MCV: 94.9 fL (ref 80.0–100.0)
Monocytes Absolute: 1 10*3/uL (ref 0.1–1.0)
Monocytes Relative: 8 %
Neutro Abs: 9.5 10*3/uL — ABNORMAL HIGH (ref 1.7–7.7)
Neutrophils Relative %: 79 %
Platelets: 252 10*3/uL (ref 150–400)
RBC: 2.97 MIL/uL — ABNORMAL LOW (ref 3.87–5.11)
RDW: 13.3 % (ref 11.5–15.5)
WBC: 12 10*3/uL — ABNORMAL HIGH (ref 4.0–10.5)
nRBC: 0 % (ref 0.0–0.2)

## 2019-03-13 LAB — MAGNESIUM: Magnesium: 1.9 mg/dL (ref 1.7–2.4)

## 2019-03-13 LAB — COMPREHENSIVE METABOLIC PANEL
ALT: 7 U/L (ref 0–44)
AST: 11 U/L — ABNORMAL LOW (ref 15–41)
Albumin: 2.4 g/dL — ABNORMAL LOW (ref 3.5–5.0)
Alkaline Phosphatase: 55 U/L (ref 38–126)
Anion gap: 8 (ref 5–15)
BUN: 6 mg/dL — ABNORMAL LOW (ref 8–23)
CO2: 19 mmol/L — ABNORMAL LOW (ref 22–32)
Calcium: 8 mg/dL — ABNORMAL LOW (ref 8.9–10.3)
Chloride: 112 mmol/L — ABNORMAL HIGH (ref 98–111)
Creatinine, Ser: 0.83 mg/dL (ref 0.44–1.00)
GFR calc Af Amer: >60 mL/min (ref >60)
GFR calc non Af Amer: >60 mL/min (ref >60)
Glucose, Bld: 95 mg/dL (ref 70–99)
Potassium: 3.7 mmol/L (ref 3.5–5.1)
Sodium: 139 mmol/L (ref 135–145)
Total Bilirubin: 0.2 mg/dL — ABNORMAL LOW (ref 0.3–1.2)
Total Protein: 5 g/dL — ABNORMAL LOW (ref 6.5–8.1)

## 2019-03-13 MED ORDER — PANTOPRAZOLE SODIUM 40 MG PO TBEC
40.0000 mg | DELAYED_RELEASE_TABLET | Freq: Every day | ORAL | Status: DC
  Administered 2019-03-13 – 2019-03-16 (×4): 40 mg via ORAL
  Filled 2019-03-13 (×4): qty 1

## 2019-03-13 MED ORDER — VANCOMYCIN 50 MG/ML ORAL SOLUTION
125.0000 mg | Freq: Four times a day (QID) | ORAL | Status: DC
  Administered 2019-03-13 – 2019-03-16 (×12): 125 mg via ORAL
  Filled 2019-03-13 (×20): qty 2.5

## 2019-03-13 MED ORDER — LACTATED RINGERS IV SOLN
INTRAVENOUS | Status: DC
  Administered 2019-03-13 – 2019-03-14 (×2): via INTRAVENOUS

## 2019-03-13 NOTE — Progress Notes (Signed)
Patient's clinical presentation suspicious for Cdiff; however, the Cdiff quick scan is negative. From review of chart, stool sample was appropriately collected prior to first dose of oral vanc that was prescribed empirically. It must be noted that Cdiff toxins can degrade at room temperature and be undetectable in samples within 2 hours after collection depending on test used, and possibility could remain that samples are falsely negative if not enough detectable toxin is present. With her recent exposure to antibiotics, leukocytosis, hypoalbuminemia, pancolitis on CT, will empirically treat for Cdiff with vanc 125 mg orally QID. If she fails to improve and GI pathogen panel is unrevealing, would need to consider endoscopic evaluation.  Annitta Needs, PhD, ANP-BC [facility]

## 2019-03-13 NOTE — Progress Notes (Signed)
PROGRESS NOTE    Rebecca Osborne  NGE:[id number] DOB: 10-18-1950 DOA: 03/11/2019 PCP: Donita Brooks, MD   Brief Narrative:  68 y.o.femalewith medical history significant ofseasonal allergies, unspecified great cervical cancer, GERD, osteopeniawho is coming to the emergency department complaints of diarrheaand abdominal painfor the past 7 days.  She had completed a course of cephalexin for a UTI recently and there was concern for C diff but the test was negative.  She continues to have diarrhea concerning for an infectious cause.    10/7: Patient continues to have ongoing lower abdominal pain and cramping.  She continues to have very frequent, loose bowel movements noted.  She is tolerating diet.  GI panel pending.  Assessment & Plan:   Principal Problem:   Pancolitis (HCC) Active Problems:   GERD   Normocytic anemia   Diarrhea of presumed infectious origin   Pancolitis with ongoing diarrhea -GI panel pending -C. difficile negative in the setting of recent antibiotic use for UTI -Full liquid diet as tolerated -Continue IV fluid -GI evaluation ongoing and appreciated  Nonspecific abdominal pain related to above -Continue IV morphine as needed for pain control  GERD -PPI  Normocytic anemia -Anemia panel with no significant abnormalities -Monitor a.m. CBC  Hypomagnesemia -Currently resolved after supplementation -Follow in a.m.    DVT prophylaxis: SCDs Code Status: Full Family Communication: Patient updated at bedside Disposition Plan: Continue IV fluids and supportive care.  Advance diet as tolerated.  Awaiting GI panel.  Potentially may require colonoscopy.  Awaiting transfer to MedSurg bed.   Consultants:   GI  Procedures:   None  Antimicrobials:  Anti-infectives (From admission, onward)   Start     Dose/Rate Route Frequency Ordered Stop   03/12/19 0000  vancomycin (VANCOCIN) 50 mg/mL oral solution 125 mg  Status:  Discontinued     125 mg Oral  Every 6 hours 03/11/19 2318 03/12/19 0914        Subjective: Patient seen and evaluated today with ongoing diarrhea and lower abdominal, crampy pain.  No acute overnight events noted.  Objective: Vitals:   03/12/19 0355 03/12/19 1524 03/12/19 2117 03/13/19 0429  BP: 100/75 (!) 126/58 (!) 111/53 (!) 95/50  Pulse: 83 90 88 84  Resp:  (!) 21 18 14   Temp: 98.1 F (36.7 C) 98.4 F (36.9 C) 98.8 F (37.1 C) 98.5 F (36.9 C)  TempSrc: Oral Oral Oral Oral  SpO2: 100% 98% 98% 97%  Weight:      Height:        Intake/Output Summary (Last 24 hours) at 03/13/2019 0754 Last data filed at 03/13/2019 0650 Gross per 24 hour  Intake 2620.36 ml  Output -  Net 2620.36 ml   Filed Weights   03/11/19 1623  Weight: 54.4 kg    Examination:  General exam: Appears calm and comfortable  Respiratory system: Clear to auscultation. Respiratory effort normal. Cardiovascular system: S1 & S2 heard, RRR. No JVD, murmurs, rubs, gallops or clicks. No pedal edema. Gastrointestinal system: Abdomen is nondistended, soft and tender to palpation over lower quadrants. No organomegaly or masses felt. Normal bowel sounds heard. Central nervous system: Alert and oriented. No focal neurological deficits. Extremities: Symmetric 5 x 5 power. Skin: No rashes, lesions or ulcers Psychiatry: Flat affect.    Data Reviewed: I have personally reviewed following labs and imaging studies  CBC: Recent Labs  Lab 03/11/19 1648 03/12/19 0320 03/13/19 0442  WBC 13.0* 11.2* 12.0*  NEUTROABS  --  8.8* 9.5*  HGB 10.8*  9.6* 8.7*  HCT 34.6* 30.5* 28.2*  MCV 94.0 95.3 94.9  PLT 293 252 252   Basic Metabolic Panel: Recent Labs  Lab 03/11/19 1648 03/12/19 0320 03/12/19 0344 03/13/19 0442  NA 141 138  --  139  K 3.8 3.4*  --  3.7  CL 111 111  --  112*  CO2 21* 19*  --  19*  GLUCOSE 108* 93  --  95  BUN 24* 16  --  6*  CREATININE 0.91 0.72  --  0.83  CALCIUM 9.2 8.3*  --  8.0*  MG  --   --  1.6* 1.9   GFR:  Estimated Creatinine Clearance: 49 mL/min (by C-G formula based on SCr of 0.83 mg/dL). Liver Function Tests: Recent Labs  Lab 03/11/19 1648 03/12/19 0320 03/13/19 0442  AST 13* 11* 11*  ALT 12 10 7   ALKPHOS 78 66 55  BILITOT 0.3 0.5 0.2*  PROT 7.3 5.8* 5.0*  ALBUMIN 3.8 3.0* 2.4*   Recent Labs  Lab 03/11/19 1648  LIPASE 27   No results for input(s): AMMONIA in the last 168 hours. Coagulation Profile: No results for input(s): INR, PROTIME in the last 168 hours. Cardiac Enzymes: No results for input(s): CKTOTAL, CKMB, CKMBINDEX, TROPONINI in the last 168 hours. BNP (last 3 results) No results for input(s): PROBNP in the last 8760 hours. HbA1C: No results for input(s): HGBA1C in the last 72 hours. CBG: No results for input(s): GLUCAP in the last 168 hours. Lipid Profile: No results for input(s): CHOL, HDL, LDLCALC, TRIG, CHOLHDL, LDLDIRECT in the last 72 hours. Thyroid Function Tests: No results for input(s): TSH, T4TOTAL, FREET4, T3FREE, THYROIDAB in the last 72 hours. Anemia Panel: Recent Labs    03/12/19 0320  VITAMINB12 175*  FOLATE 15.2  FERRITIN 63  TIBC 232*  IRON 42  RETICCTPCT 1.2   Sepsis Labs: No results for input(s): PROCALCITON, LATICACIDVEN in the last 168 hours.  Recent Results (from the past 240 hour(s))  C difficile quick scan w PCR reflex     Status: None   Collection Time: 03/11/19  9:22 PM   Specimen: STOOL  Result Value Ref Range Status   C Diff antigen NEGATIVE NEGATIVE Final   C Diff toxin NEGATIVE NEGATIVE Final   C Diff interpretation No C. difficile detected.  Final    Comment: Performed at [facility], [address] [place], [place] [address]  SARS Coronavirus 2 [facility] order, Performed in [facility] hospital lab) Nasopharyngeal Nasopharyngeal Swab     Status: None   Collection Time: 03/12/19  2:18 AM   Specimen: Nasopharyngeal Swab  Result Value Ref Range Status   SARS Coronavirus 2 NEGATIVE NEGATIVE Final    Comment: (NOTE)  If result is NEGATIVE SARS-CoV-2 target nucleic acids are NOT DETECTED. The SARS-CoV-2 RNA is generally detectable in upper and lower  respiratory specimens during the acute phase of infection. The lowest  concentration of SARS-CoV-2 viral copies this assay can detect is 250  copies / mL. A negative result does not preclude SARS-CoV-2 infection  and should not be used as the sole basis for treatment or other  patient management decisions.  A negative result may occur with  improper specimen collection / handling, submission of specimen other  than nasopharyngeal swab, presence of viral mutation(s) within the  areas targeted by this assay, and inadequate number of viral copies  (<250 copies / mL). A negative result must be combined with clinical  observations, patient history, and epidemiological  information. If result is POSITIVE SARS-CoV-2 target nucleic acids are DETECTED. The SARS-CoV-2 RNA is generally detectable in upper and lower  respiratory specimens dur ing the acute phase of infection.  Positive  results are indicative of active infection with SARS-CoV-2.  Clinical  correlation with patient history and other diagnostic information is  necessary to determine patient infection status.  Positive results do  not rule out bacterial infection or co-infection with other viruses. If result is PRESUMPTIVE POSTIVE SARS-CoV-2 nucleic acids MAY BE PRESENT.   A presumptive positive result was obtained on the submitted specimen  and confirmed on repeat testing.  While 2019 novel coronavirus  (SARS-CoV-2) nucleic acids may be present in the submitted sample  additional confirmatory testing may be necessary for epidemiological  and / or clinical management purposes  to differentiate between  SARS-CoV-2 and other Sarbecovirus currently known to infect humans.  If clinically indicated additional testing with an alternate test  methodology [phone]) is advised. The SARS-CoV-2 RNA is generally   detectable in upper and lower respiratory sp ecimens during the acute  phase of infection. The expected result is Negative. Fact Sheet for Patients:  BoilerBrush.com.cy Fact Sheet for Healthcare Providers: https://pope.com/ This test is not yet approved or cleared by the [place] FDA and has been authorized for detection and/or diagnosis of SARS-CoV-2 by FDA under an Emergency Use Authorization (EUA).  This EUA will remain in effect (meaning this test can be used) for the duration of the COVID-19 declaration under Section 564(b)(1) of the Act, 21 U.S.C. section 360bbb-3(b)(1), unless the authorization is terminated or revoked sooner. Performed at [facility], [address] [place], [place] [address]   MRSA PCR Screening     Status: None   Collection Time: 03/12/19  3:56 AM   Specimen: Nasal Mucosa; Nasopharyngeal  Result Value Ref Range Status   MRSA by PCR NEGATIVE NEGATIVE Final    Comment:        The GeneXpert MRSA Assay (FDA approved for NASAL specimens only), is one component of a comprehensive MRSA colonization surveillance program. It is not intended to diagnose MRSA infection nor to guide or monitor treatment for MRSA infections. Performed at [facility], [address] [place], [place] [address]          Radiology Studies: Ct Abdomen Pelvis W Contrast  Result Date: 03/11/2019 CLINICAL DATA:  Lower abdominal pain with diarrhea history of bowel resection EXAM: CT ABDOMEN AND PELVIS WITH CONTRAST TECHNIQUE: Multidetector CT imaging of the abdomen and pelvis was performed using the standard protocol following bolus administration of intravenous contrast. CONTRAST:  OMNIPAQUE IOHEXOL 300 MG/ML  SOLN COMPARISON:  CT 08/17/2018, 09/07/2016 FINDINGS: Lower chest: Lung bases demonstrate no acute consolidation or effusion. The heart size is normal. Small hiatal hernia Hepatobiliary: Probable focal fat infiltration  near the inferior falciform ligament. Status post cholecystectomy. Stable extrahepatic biliary dilatation with common duct dilated up to 12 mm. Pancreas: Unremarkable. No pancreatic ductal dilatation or surrounding inflammatory changes. Spleen: Normal in size without focal abnormality. Adrenals/Urinary Tract: Adrenal glands are normal. Cyst upper pole left kidney. Cortical scarring at the upper poles of both kidneys. No hydronephrosis. Urinary bladder is unremarkable. Stomach/Bowel: The stomach is nonenlarged. No dilated small bowel. Diffuse wall thickening and mucosal enhancement of the entire colon. Appendix not well seen but no right lower quadrant inflammatory process. Postsurgical changes within the pelvis. Vascular/Lymphatic: Moderate aortic atherosclerosis. No aneurysm. Small right lower quadrant mesenteric nodes. Reproductive: Status post hysterectomy. No adnexal masses. Other:  Negative for free air or free fluid Musculoskeletal: No acute or suspicious osseous abnormality IMPRESSION: 1. Diffuse wall thickening and mucosal enhancement of the entire colon consistent with pancolitis, which may be secondary to infection to include C difficile colitis, inflammatory bowel disease, or less likely ischemic disease. Negative for bowel obstruction or perforation. 2. Status post cholecystectomy with stable enlargement of the extrahepatic bile duct Electronically Signed   By: Jasmine Pang M.D.   On: 03/11/2019 22:58        Scheduled Meds: . Chlorhexidine Gluconate Cloth  6 each Topical Daily  . influenza vaccine adjuvanted  0.5 mL Intramuscular Tomorrow-1000  .  morphine injection  1 mg Intravenous Q6H  . ondansetron (ZOFRAN) IV  4 mg Intravenous TID WC & HS   Continuous Infusions: . lactated ringers       LOS: 1 day    Time spent: 30 minutes    Andrews Tener Hoover Brunette, DO [facility] Pager [phone]  If 7PM-7AM, please contact night-coverage [email or web address] Password [email or web address] 03/13/2019, 7:54 AM

## 2019-03-13 NOTE — Progress Notes (Signed)
Subjective: Lower abdominal pain crampy but improved from admission. No further lower back pain, which was present on admission. 5-6 stools yesterday but consistency is improving with more form. No rectal bleeding. Tolerating full liquids this morning.   Objective: Vital signs in last 24 hours: Temp:  [98.4 F (36.9 C)-98.8 F (37.1 C)] 98.5 F (36.9 C) (10/07 0429) Pulse Rate:  [84-90] 84 (10/07 0429) Resp:  [14-21] 14 (10/07 0429) BP: (95-126)/(50-58) 95/50 (10/07 0429) SpO2:  [97 %-98 %] 97 % (10/07 0429) Last BM Date: 03/12/19 General:   Alert and oriented, flat affect Abdomen:  Bowel sounds present, soft, TTP lower abdomen, no rebound or guarding, non-distended Extremities:  With mild ankle edema, chronic per patient Neurologic:  Alert and  oriented x4   Intake/Output from previous day: 10/06 0701 - 10/07 0700 In: 2620.4 [I.V.:1953.7; IV Piggyback:666.7] Out: -  Intake/Output this shift: No intake/output data recorded.  Lab Results: Recent Labs    03/11/19 1648 03/12/19 0320 03/13/19 0442  WBC 13.0* 11.2* 12.0*  HGB 10.8* 9.6* 8.7*  HCT 34.6* 30.5* 28.2*  PLT 293 252 252   BMET Recent Labs    03/11/19 1648 03/12/19 0320 03/13/19 0442  NA 141 138 139  K 3.8 3.4* 3.7  CL 111 111 112*  CO2 21* 19* 19*  GLUCOSE 108* 93 95  BUN 24* 16 6*  CREATININE 0.91 0.72 0.83  CALCIUM 9.2 8.3* 8.0*   LFT Recent Labs    03/11/19 1648 03/12/19 0320 03/13/19 0442  PROT 7.3 5.8* 5.0*  ALBUMIN 3.8 3.0* 2.4*  AST 13* 11* 11*  ALT 12 10 7   ALKPHOS 78 66 55  BILITOT 0.3 0.5 0.2*     Studies/Results: Ct Abdomen Pelvis W Contrast  Result Date: 03/11/2019 CLINICAL DATA:  Lower abdominal pain with diarrhea history of bowel resection EXAM: CT ABDOMEN AND PELVIS WITH CONTRAST TECHNIQUE: Multidetector CT imaging of the abdomen and pelvis was performed using the standard protocol following bolus administration of intravenous contrast. CONTRAST:  174m OMNIPAQUE  IOHEXOL 300 MG/ML  SOLN COMPARISON:  CT 08/17/2018, 09/07/2016 FINDINGS: Lower chest: Lung bases demonstrate no acute consolidation or effusion. The heart size is normal. Small hiatal hernia Hepatobiliary: Probable focal fat infiltration near the inferior falciform ligament. Status post cholecystectomy. Stable extrahepatic biliary dilatation with common duct dilated up to 12 mm. Pancreas: Unremarkable. No pancreatic ductal dilatation or surrounding inflammatory changes. Spleen: Normal in size without focal abnormality. Adrenals/Urinary Tract: Adrenal glands are normal. Cyst upper pole left kidney. Cortical scarring at the upper poles of both kidneys. No hydronephrosis. Urinary bladder is unremarkable. Stomach/Bowel: The stomach is nonenlarged. No dilated small bowel. Diffuse wall thickening and mucosal enhancement of the entire colon. Appendix not well seen but no right lower quadrant inflammatory process. Postsurgical changes within the pelvis. Vascular/Lymphatic: Moderate aortic atherosclerosis. No aneurysm. Small right lower quadrant mesenteric nodes. Reproductive: Status post hysterectomy. No adnexal masses. Other: Negative for free air or free fluid Musculoskeletal: No acute or suspicious osseous abnormality IMPRESSION: 1. Diffuse wall thickening and mucosal enhancement of the entire colon consistent with pancolitis, which may be secondary to infection to include C difficile colitis, inflammatory bowel disease, or less likely ischemic disease. Negative for bowel obstruction or perforation. 2. Status post cholecystectomy with stable enlargement of the extrahepatic bile duct Electronically Signed   By: KDonavan FoilM.D.   On: 03/11/2019 22:58    Assessment: 68year old female admitted with acute diarrhea and abdominal pain with findings of pancolitis on contrast  CT, Cdiff negative, and GI pathogen remains pending. Initially, received oral vanc X 2 doses but was discontinued once Cdiff negative. Clinically,  she has slowly improved with improved consistency of stools, improving abdominal pain, and tolerating advancement to full liquid diet. Recommend outpatient colonoscopy once over acute illness, as last was in 2011. GI pathogen panel received at [facility] in [place] this morning, with a 2-3 day turnaround time. Currently, she is not on empiric antibiotic therapy but improving. Continue with supportive measures for now.   Normocytic anemia: B12 low, ferritin 63, iron 42. Hgb drifting but no overt GI bleeding. Dilutional effect contributing.   Plan: Continue supportive measures Continue full liquids and hopefully advance to soft diet in next 12-24 hours GI pathogen panel pending: earliest result by 10/9 or 10/10 Outpatient colonoscopy once acute illness resolved   Annitta Needs, PhD, ANP-BC [facility]    LOS: 1 day    03/13/2019, 7:25 AM

## 2019-03-14 LAB — COMPREHENSIVE METABOLIC PANEL
ALT: 9 U/L (ref 0–44)
AST: 9 U/L — ABNORMAL LOW (ref 15–41)
Albumin: 2.3 g/dL — ABNORMAL LOW (ref 3.5–5.0)
Alkaline Phosphatase: 59 U/L (ref 38–126)
Anion gap: 5 (ref 5–15)
BUN: 6 mg/dL — ABNORMAL LOW (ref 8–23)
CO2: 25 mmol/L (ref 22–32)
Calcium: 8.3 mg/dL — ABNORMAL LOW (ref 8.9–10.3)
Chloride: 111 mmol/L (ref 98–111)
Creatinine, Ser: 0.92 mg/dL (ref 0.44–1.00)
GFR calc Af Amer: >60 mL/min (ref >60)
GFR calc non Af Amer: >60 mL/min (ref >60)
Glucose, Bld: 93 mg/dL (ref 70–99)
Potassium: 4 mmol/L (ref 3.5–5.1)
Sodium: 141 mmol/L (ref 135–145)
Total Bilirubin: 0.2 mg/dL — ABNORMAL LOW (ref 0.3–1.2)
Total Protein: 4.8 g/dL — ABNORMAL LOW (ref 6.5–8.1)

## 2019-03-14 LAB — CBC
HCT: 26.6 % — ABNORMAL LOW (ref 36.0–46.0)
Hemoglobin: 8.2 g/dL — ABNORMAL LOW (ref 12.0–15.0)
MCH: 29.3 pg (ref 26.0–34.0)
MCHC: 30.8 g/dL (ref 30.0–36.0)
MCV: 95 fL (ref 80.0–100.0)
Platelets: 228 10*3/uL (ref 150–400)
RBC: 2.8 MIL/uL — ABNORMAL LOW (ref 3.87–5.11)
RDW: 13.2 % (ref 11.5–15.5)
WBC: 10.1 10*3/uL (ref 4.0–10.5)
nRBC: 0 % (ref 0.0–0.2)

## 2019-03-14 MED ORDER — CYANOCOBALAMIN 1000 MCG/ML IJ SOLN
1000.0000 ug | Freq: Once | INTRAMUSCULAR | Status: AC
  Administered 2019-03-14: 12:00:00 1000 ug via INTRAMUSCULAR
  Filled 2019-03-14: qty 1

## 2019-03-14 NOTE — Plan of Care (Signed)
  Problem: Education: Goal: Knowledge of General Education information will improve Description Including pain rating scale, medication(s)/side effects and non-pharmacologic comfort measures Outcome: Progressing   Problem: Health Behavior/Discharge Planning: Goal: Ability to manage health-related needs will improve Outcome: Progressing   

## 2019-03-14 NOTE — Plan of Care (Signed)

## 2019-03-14 NOTE — Progress Notes (Signed)
Ate about 75% of heart healthy diet for supper and denied increased nausea or abd pain.

## 2019-03-14 NOTE — Progress Notes (Addendum)
Subjective:  Patient reports about 4 loose stools during the day yesterday.  Late evening had a semi-formed bowel movement.  Did not wake up to have any stools during the night.  No BM this morning.  Continues to have abdominal pain.  Tolerated full liquid breakfast.  No nausea.  Receiving around-the-clock Zofran.  Objective: Vital signs in last 24 hours: Temp:  [97.6 F (36.4 C)-98.2 F (36.8 C)] 97.8 F (36.6 C) (10/08 0535) Pulse Rate:  [73-82] 73 (10/08 0535) Resp:  [16] 16 (10/08 0535) BP: (100-102)/(47-54) 100/47 (10/08 0535) SpO2:  [95 %-100 %] 98 % (10/08 0535) Last BM Date: 03/13/19 General:   Alert,  Well-developed, well-nourished, pleasant and cooperative in NAD.  Appears uncomfortable. Head:  Normocephalic and atraumatic. Eyes:  Sclera clear, no icterus.  Abdomen:  Soft,  nondistended.  Diffuse tenderness with light palpation, no rebound or guarding. Normal bowel sounds.   Extremities:  Without clubbing, deformity or edema. Neurologic:  Alert and  oriented x4;  grossly normal neurologically. Skin:  Intact without significant lesions or rashes. Psych:  Alert and cooperative. Normal mood and affect.  Intake/Output from previous day: 10/07 0701 - 10/08 0700 In: 1000 [I.V.:1000] Out: -  Intake/Output this shift: No intake/output data recorded.  Lab Results: CBC Recent Labs    03/12/19 0320 03/13/19 0442 03/14/19 0653  WBC 11.2* 12.0* 10.1  HGB 9.6* 8.7* 8.2*  HCT 30.5* 28.2* 26.6*  MCV 95.3 94.9 95.0  PLT 252 252 228   BMET Recent Labs    03/12/19 0320 03/13/19 0442 03/14/19 0653  NA 138 139 141  K 3.4* 3.7 4.0  CL 111 112* 111  CO2 19* 19* 25  GLUCOSE 93 95 93  BUN 16 6* 6*  CREATININE 0.72 0.83 0.92  CALCIUM 8.3* 8.0* 8.3*   LFTs Recent Labs    03/12/19 0320 03/13/19 0442 03/14/19 0653  BILITOT 0.5 0.2* 0.2*  ALKPHOS 66 55 59  AST 11* 11* 9*  ALT 10 7 9   PROT 5.8* 5.0* 4.8*  ALBUMIN 3.0* 2.4* 2.3*   Recent Labs    03/11/19 1648   LIPASE 27   PT/INR No results for input(s): LABPROT, INR in the last 72 hours.    Imaging Studies: Ct Abdomen Pelvis W Contrast  Result Date: 03/11/2019 CLINICAL DATA:  Lower abdominal pain with diarrhea history of bowel resection EXAM: CT ABDOMEN AND PELVIS WITH CONTRAST TECHNIQUE: Multidetector CT imaging of the abdomen and pelvis was performed using the standard protocol following bolus administration of intravenous contrast. CONTRAST:  147m OMNIPAQUE IOHEXOL 300 MG/ML  SOLN COMPARISON:  CT 08/17/2018, 09/07/2016 FINDINGS: Lower chest: Lung bases demonstrate no acute consolidation or effusion. The heart size is normal. Small hiatal hernia Hepatobiliary: Probable focal fat infiltration near the inferior falciform ligament. Status post cholecystectomy. Stable extrahepatic biliary dilatation with common duct dilated up to 12 mm. Pancreas: Unremarkable. No pancreatic ductal dilatation or surrounding inflammatory changes. Spleen: Normal in size without focal abnormality. Adrenals/Urinary Tract: Adrenal glands are normal. Cyst upper pole left kidney. Cortical scarring at the upper poles of both kidneys. No hydronephrosis. Urinary bladder is unremarkable. Stomach/Bowel: The stomach is nonenlarged. No dilated small bowel. Diffuse wall thickening and mucosal enhancement of the entire colon. Appendix not well seen but no right lower quadrant inflammatory process. Postsurgical changes within the pelvis. Vascular/Lymphatic: Moderate aortic atherosclerosis. No aneurysm. Small right lower quadrant mesenteric nodes. Reproductive: Status post hysterectomy. No adnexal masses. Other: Negative for free air or free fluid Musculoskeletal: No acute or  suspicious osseous abnormality IMPRESSION: 1. Diffuse wall thickening and mucosal enhancement of the entire colon consistent with pancolitis, which may be secondary to infection to include C difficile colitis, inflammatory bowel disease, or less likely ischemic disease.  Negative for bowel obstruction or perforation. 2. Status post cholecystectomy with stable enlargement of the extrahepatic bile duct Electronically Signed   By: Donavan Foil M.D.   On: 03/11/2019 22:58  [2 weeks]   Assessment:  68 year old female admitted with acute diarrhea and abdominal pain with findings of pancolitis on CT, C. difficile negative, GI pathogen panel pending.  Initially received oral vancomycin x2 doses but discontinued once C. difficile was negative.  Some slight improvement temporarily with consistency of stools.  Being treated empirically for C. difficile given high suspicion, recent exposure to antibiotics, leukocytosis, hypoalbuminemia, pancolitis on CT. Noted semi-formed stool last night.  No bowel movements overnight.  Moderate abdominal pain persisting. Leukocytosis resolved.  Normocytic anemia: B12 low, ferritin 63, iron 42, folate 15.2. Suspect some hemodilution effect. No signs of overt bleeding.   Plan: 1. Management of low B12 per attending. 2. Monitor hemoglobin. 3. Follow-up pending GI pathogen panel. 4. Continue full liquids for now due to persisting abdominal pain, consider advancement of diet hopefully in the next 12 to 24 hours. 5. Outpatient colonoscopy once acute illness resolves. 6. Consider change from scheduled Morphine to prn.   Laureen Ochs. Bernarda Caffey [facility] [phone] 10/8/202010:30 AM  Attending note: Patient seen and examined this afternoon.  Abdominal pain has improved in the past 24 hours.  Stool frequency has diminished; now with some form.  Patient is hungry and desires a diet.  Will advance to low residue.  Reassess tomorrow morning.   LOS: 2 days

## 2019-03-14 NOTE — Progress Notes (Signed)
Denies nausea.  Stated that last bm was small and formed.

## 2019-03-14 NOTE — Progress Notes (Signed)
Two more small soft bms with formed edges and no mucous.

## 2019-03-14 NOTE — Progress Notes (Signed)
PROGRESS NOTE    Rebecca Osborne  ZOX:[id number] DOB: 1951-04-22 DOA: 03/11/2019 PCP: Susy Frizzle, MD   Brief Narrative:  68 y.o.femalewith medical history significant ofseasonal allergies, unspecified great cervical cancer, GERD, osteopeniawho is coming to the emergency department complaints of diarrheaand abdominal painfor the past 7 days.She had completed a course of cephalexin for a UTI recently and there was concern for C diff but the test was negative. She continues to have diarrhea concerning for an infectious cause.   10/7: Patient continues to have ongoing lower abdominal pain and cramping.  She continues to have very frequent, loose bowel movements noted.  She is tolerating diet.  GI panel pending.  10/8: Patient continues to have ongoing lower abdominal pain, but is tolerating full liquid diet.  Stools are becoming more formed.  Assessment & Plan:   Principal Problem:   Pancolitis ([place]) Active Problems:   GERD   Normocytic anemia   Diarrhea of presumed infectious origin   Pancolitis with ongoing diarrhea-improving -GI panel pending -C. difficile negative in the setting of recent antibiotic use for UTI, despite this she has been started on oral vancomycin with improvement noted -Full liquid diet for now and advance once abdominal pain improved -DC IV fluid -GI evaluation ongoing and appreciated  Nonspecific abdominal pain related to above -Continue IV morphine as needed for pain control  Normocytic anemia with Vitamin B12 deficiency -Supplement with intramuscular injection -Monitor a.m. CBC  GERD -PPI  Hypomagnesemia -Currently resolved after supplementation -Follow in a.m.   DVT prophylaxis: SCDs Code Status: Full Family Communication: Patient updated at bedside Disposition Plan: Continue IV fluids and supportive care.  Advance diet as tolerated.  Awaiting GI panel.  Potentially may require colonoscopy.    Consultants:   GI   Procedures:   None  Antimicrobials:  Anti-infectives (From admission, onward)   Start     Dose/Rate Route Frequency Ordered Stop   03/13/19 1800  vancomycin (VANCOCIN) 50 mg/mL oral solution 125 mg     125 mg Oral Every 6 hours 03/13/19 1606 03/23/19 1759   03/12/19 0000  vancomycin (VANCOCIN) 50 mg/mL oral solution 125 mg  Status:  Discontinued     125 mg Oral Every 6 hours 03/11/19 2318 03/12/19 0914       Subjective: Patient seen and evaluated today with no new acute complaints or concerns aside from some ongoing abdominal cramping and tenderness noted.  She is starting to have more formed bowel movements and is tolerating full liquid diet.  No nausea or vomiting noted.  Objective: Vitals:   03/13/19 1707 03/13/19 2017 03/13/19 2056 03/14/19 0535  BP: (!) 102/51  (!) 101/54 (!) 100/47  Pulse:   82 73  Resp: 16  16 16   Temp: 98.2 F (36.8 C)  97.6 F (36.4 C) 97.8 F (36.6 C)  TempSrc: Oral     SpO2: 99% 100% 95% 98%  Weight:      Height:        Intake/Output Summary (Last 24 hours) at 03/14/2019 1059 Last data filed at 03/14/2019 0851 Gross per 24 hour  Intake 1240 ml  Output -  Net 1240 ml   Filed Weights   03/11/19 1623  Weight: 54.4 kg    Examination:  General exam: Appears calm and comfortable  Respiratory system: Clear to auscultation. Respiratory effort normal. Cardiovascular system: S1 & S2 heard, RRR. No JVD, murmurs, rubs, gallops or clicks. No pedal edema. Gastrointestinal system: Abdomen is nondistended, soft and tender to palpation  over lower quadrants. No organomegaly or masses felt. Normal bowel sounds heard. Central nervous system: Alert and oriented. No focal neurological deficits. Extremities: Symmetric 5 x 5 power. Skin: No rashes, lesions or ulcers Psychiatry: Judgement and insight appear normal. Mood & affect appropriate.     Data Reviewed: I have personally reviewed following labs and imaging studies  CBC: Recent Labs  Lab 03/11/19  1648 03/12/19 0320 03/13/19 0442 03/14/19 0653  WBC 13.0* 11.2* 12.0* 10.1  NEUTROABS  --  8.8* 9.5*  --   HGB 10.8* 9.6* 8.7* 8.2*  HCT 34.6* 30.5* 28.2* 26.6*  MCV 94.0 95.3 94.9 95.0  PLT 293 252 252 846   Basic Metabolic Panel: Recent Labs  Lab 03/11/19 1648 03/12/19 0320 03/12/19 0344 03/13/19 0442 03/14/19 0653  NA 141 138  --  139 141  K 3.8 3.4*  --  3.7 4.0  CL 111 111  --  112* 111  CO2 21* 19*  --  19* 25  GLUCOSE 108* 93  --  95 93  BUN 24* 16  --  6* 6*  CREATININE 0.91 0.72  --  0.83 0.92  CALCIUM 9.2 8.3*  --  8.0* 8.3*  MG  --   --  1.6* 1.9  --    GFR: Estimated Creatinine Clearance: 44.2 mL/min (by C-G formula based on SCr of 0.92 mg/dL). Liver Function Tests: Recent Labs  Lab 03/11/19 1648 03/12/19 0320 03/13/19 0442 03/14/19 0653  AST 13* 11* 11* 9*  ALT 12 10 7 9   ALKPHOS 78 66 55 59  BILITOT 0.3 0.5 0.2* 0.2*  PROT 7.3 5.8* 5.0* 4.8*  ALBUMIN 3.8 3.0* 2.4* 2.3*   Recent Labs  Lab 03/11/19 1648  LIPASE 27   No results for input(s): AMMONIA in the last 168 hours. Coagulation Profile: No results for input(s): INR, PROTIME in the last 168 hours. Cardiac Enzymes: No results for input(s): CKTOTAL, CKMB, CKMBINDEX, TROPONINI in the last 168 hours. BNP (last 3 results) No results for input(s): PROBNP in the last 8760 hours. HbA1C: No results for input(s): HGBA1C in the last 72 hours. CBG: No results for input(s): GLUCAP in the last 168 hours. Lipid Profile: No results for input(s): CHOL, HDL, LDLCALC, TRIG, CHOLHDL, LDLDIRECT in the last 72 hours. Thyroid Function Tests: No results for input(s): TSH, T4TOTAL, FREET4, T3FREE, THYROIDAB in the last 72 hours. Anemia Panel: Recent Labs    03/12/19 0320  VITAMINB12 175*  FOLATE 15.2  FERRITIN 63  TIBC 232*  IRON 42  RETICCTPCT 1.2   Sepsis Labs: No results for input(s): PROCALCITON, LATICACIDVEN in the last 168 hours.  Recent Results (from the past 240 hour(s))  C difficile  quick scan w PCR reflex     Status: None   Collection Time: 03/11/19  9:22 PM   Specimen: STOOL  Result Value Ref Range Status   C Diff antigen NEGATIVE NEGATIVE Final   C Diff toxin NEGATIVE NEGATIVE Final   C Diff interpretation No C. difficile detected.  Final    Comment: Performed at [facility], [address] [place], [place] [address]  SARS Coronavirus 2 [facility] order, Performed in [facility] hospital lab) Nasopharyngeal Nasopharyngeal Swab     Status: None   Collection Time: 03/12/19  2:18 AM   Specimen: Nasopharyngeal Swab  Result Value Ref Range Status   SARS Coronavirus 2 NEGATIVE NEGATIVE Final    Comment: (NOTE) If result is NEGATIVE SARS-CoV-2 target nucleic acids are NOT DETECTED. The SARS-CoV-2 RNA is  generally detectable in upper and lower  respiratory specimens during the acute phase of infection. The lowest  concentration of SARS-CoV-2 viral copies this assay can detect is 250  copies / mL. A negative result does not preclude SARS-CoV-2 infection  and should not be used as the sole basis for treatment or other  patient management decisions.  A negative result may occur with  improper specimen collection / handling, submission of specimen other  than nasopharyngeal swab, presence of viral mutation(s) within the  areas targeted by this assay, and inadequate number of viral copies  (<250 copies / mL). A negative result must be combined with clinical  observations, patient history, and epidemiological information. If result is POSITIVE SARS-CoV-2 target nucleic acids are DETECTED. The SARS-CoV-2 RNA is generally detectable in upper and lower  respiratory specimens dur ing the acute phase of infection.  Positive  results are indicative of active infection with SARS-CoV-2.  Clinical  correlation with patient history and other diagnostic information is  necessary to determine patient infection status.  Positive results do  not rule out bacterial infection or  co-infection with other viruses. If result is PRESUMPTIVE POSTIVE SARS-CoV-2 nucleic acids MAY BE PRESENT.   A presumptive positive result was obtained on the submitted specimen  and confirmed on repeat testing.  While 2019 novel coronavirus  (SARS-CoV-2) nucleic acids may be present in the submitted sample  additional confirmatory testing may be necessary for epidemiological  and / or clinical management purposes  to differentiate between  SARS-CoV-2 and other Sarbecovirus currently known to infect humans.  If clinically indicated additional testing with an alternate test  methodology [phone]) is advised. The SARS-CoV-2 RNA is generally  detectable in upper and lower respiratory sp ecimens during the acute  phase of infection. The expected result is Negative. Fact Sheet for Patients:  StrictlyIdeas.no Fact Sheet for Healthcare Providers: BankingDealers.co.za This test is not yet approved or cleared by the [place] FDA and has been authorized for detection and/or diagnosis of SARS-CoV-2 by FDA under an Emergency Use Authorization (EUA).  This EUA will remain in effect (meaning this test can be used) for the duration of the COVID-19 declaration under Section 564(b)(1) of the Act, 21 U.S.C. section 360bbb-3(b)(1), unless the authorization is terminated or revoked sooner. Performed at [facility], [address] [place], [place] [address]   MRSA PCR Screening     Status: None   Collection Time: 03/12/19  3:56 AM   Specimen: Nasal Mucosa; Nasopharyngeal  Result Value Ref Range Status   MRSA by PCR NEGATIVE NEGATIVE Final    Comment:        The GeneXpert MRSA Assay (FDA approved for NASAL specimens only), is one component of a comprehensive MRSA colonization surveillance program. It is not intended to diagnose MRSA infection nor to guide or monitor treatment for MRSA infections. Performed at [facility], [address],  [place], [place] [address]          Radiology Studies: No results found.      Scheduled Meds: . Chlorhexidine Gluconate Cloth  6 each Topical Daily  . cyanocobalamin  1,000 mcg Intramuscular Once  . influenza vaccine adjuvanted  0.5 mL Intramuscular Tomorrow-1000  . ondansetron (ZOFRAN) IV  4 mg Intravenous TID WC & HS  . pantoprazole  40 mg Oral Daily  . vancomycin  125 mg Oral Q6H   Continuous Infusions: . lactated ringers 75 mL/hr at 03/14/19 0953     LOS: 2 days  Time spent: 30 minutes     Darleen Crocker, DO [facility] Pager [phone]  If 7PM-7AM, please contact night-coverage [email or web address] Password [email or web address] 03/14/2019, 10:59 AM

## 2019-03-14 NOTE — Progress Notes (Signed)
BM in hat, three small soft blobs with clearcut edges and blobs of mucous, as well. Continues to C/O abd pain

## 2019-03-15 DIAGNOSIS — K219 Gastro-esophageal reflux disease without esophagitis: Secondary | ICD-10-CM

## 2019-03-15 DIAGNOSIS — D649 Anemia, unspecified: Secondary | ICD-10-CM

## 2019-03-15 LAB — CBC
HCT: 29.9 % — ABNORMAL LOW (ref 36.0–46.0)
Hemoglobin: 9.2 g/dL — ABNORMAL LOW (ref 12.0–15.0)
MCH: 29.5 pg (ref 26.0–34.0)
MCHC: 30.8 g/dL (ref 30.0–36.0)
MCV: 95.8 fL (ref 80.0–100.0)
Platelets: 262 10*3/uL (ref 150–400)
RBC: 3.12 MIL/uL — ABNORMAL LOW (ref 3.87–5.11)
RDW: 13.2 % (ref 11.5–15.5)
WBC: 8.5 10*3/uL (ref 4.0–10.5)
nRBC: 0 % (ref 0.0–0.2)

## 2019-03-15 LAB — GI PATHOGEN PANEL BY PCR, STOOL

## 2019-03-15 LAB — COMPREHENSIVE METABOLIC PANEL
ALT: 8 U/L (ref 0–44)
AST: 11 U/L — ABNORMAL LOW (ref 15–41)
Albumin: 2.5 g/dL — ABNORMAL LOW (ref 3.5–5.0)
Alkaline Phosphatase: 61 U/L (ref 38–126)
Anion gap: 5 (ref 5–15)
BUN: 7 mg/dL — ABNORMAL LOW (ref 8–23)
CO2: 25 mmol/L (ref 22–32)
Calcium: 8.3 mg/dL — ABNORMAL LOW (ref 8.9–10.3)
Chloride: 114 mmol/L — ABNORMAL HIGH (ref 98–111)
Creatinine, Ser: 1.04 mg/dL — ABNORMAL HIGH (ref 0.44–1.00)
GFR calc Af Amer: >60 mL/min (ref >60)
GFR calc non Af Amer: 55 mL/min — ABNORMAL LOW (ref >60)
Glucose, Bld: 98 mg/dL (ref 70–99)
Potassium: 3.9 mmol/L (ref 3.5–5.1)
Sodium: 144 mmol/L (ref 135–145)
Total Bilirubin: 0.1 mg/dL — ABNORMAL LOW (ref 0.3–1.2)
Total Protein: 5.3 g/dL — ABNORMAL LOW (ref 6.5–8.1)

## 2019-03-15 MED ORDER — VITAMIN B-12 1000 MCG PO TABS
1000.0000 ug | ORAL_TABLET | Freq: Every day | ORAL | Status: DC
  Administered 2019-03-15 – 2019-03-16 (×2): 1000 ug via ORAL
  Filled 2019-03-15 (×2): qty 1

## 2019-03-15 NOTE — Progress Notes (Signed)
PROGRESS NOTE    WARD AMMIRATI  ZHY:[id number] DOB: [birth year] DOA: 03/11/2019 PCP: Donita Brooks, MD   Brief Narrative:  68 y.o.femalewith medical history significant ofseasonal allergies, unspecified great cervical cancer, GERD, osteopeniawho is coming to the emergency department complaints of diarrheaand abdominal painfor the past 7 days.She had completed a course of cephalexin for a UTI recently and there was concern for C diff but the test was negative. She continues to have diarrhea concerning for an infectious cause.  10/7: Patient continues to have ongoing lower abdominal pain and cramping. She continues to have very frequent, loose bowel movements noted. She is tolerating diet. GI panel pending.  10/8: Patient continues to have ongoing lower abdominal pain, but is tolerating full liquid diet.  Stools are becoming more formed.  10/9: Patient states that lower abdominal pain is starting to improve and has been started on a regular diet which she is tolerating somewhat.  Stools are more formed as well and she has less frequency.  Discussed case with GI who feels that she may be appropriate for discharge potentially in the next 24 to 48 hours.  Assessment & Plan:   Principal Problem:   Pancolitis ([facility]) Active Problems:   GERD   Normocytic anemia   Diarrhea of presumed infectious origin   Pancolitis with ongoing diarrhea-improving slowly -GI panel still pending -C. difficile negative in the setting of recent antibiotic use for UTI, despite this she has been started on oral vancomycin with improvement noted -Full liquid diet for now and advance once abdominal pain improved -GI evaluation ongoing and appreciated with diet advanced today  Nonspecific abdominal pain related to above -Continue IV fentanyl as needed for pain control  Normocytic anemia with Vitamin B12 deficiency -Supplement with intramuscular injection -We will start oral supplementation  -Monitor a.m. CBC  GERD -PPI  Hypomagnesemia -Currently resolved after supplementation -Follow in a.m.   DVT prophylaxis:SCDs Code Status:Full Family Communication:Patient updated at bedside Disposition Plan:Continue current diet and see how well this is tolerated.  Likely discharge in a.m if tolerating diet.   Consultants:  GI  Procedures:  None  Antimicrobials:  Anti-infectives (From admission, onward)   Start     Dose/Rate Route Frequency Ordered Stop   03/13/19 1800  vancomycin (VANCOCIN) 50 mg/mL oral solution 125 mg     125 mg Oral Every 6 hours 03/13/19 1606 03/23/19 1759   03/12/19 0000  vancomycin (VANCOCIN) 50 mg/mL oral solution 125 mg  Status:  Discontinued     125 mg Oral Every 6 hours 03/11/19 2318 03/12/19 0914       Subjective: Patient seen and evaluated today with no new acute complaints or concerns. No acute concerns or events noted overnight.  She continues to have persistent lower abdominal pain, but stool is becoming more formed and bowel movements are less frequent.  Objective: Vitals:   03/14/19 0535 03/14/19 1338 03/14/19 2134 03/15/19 0516  BP: (!) 100/47 (!) 98/50 101/61 111/66  Pulse: 73 74 74 72  Resp: 16 16 16 16   Temp: 97.8 F (36.6 C) 98.1 F (36.7 C) 97.8 F (36.6 C) 98 F (36.7 C)  TempSrc:  Oral    SpO2: 98% 98% 99% 100%  Weight:      Height:        Intake/Output Summary (Last 24 hours) at 03/15/2019 1144 Last data filed at 03/15/2019 0900 Gross per 24 hour  Intake 840 ml  Output -  Net 840 ml   [employer]  03/11/19 1623  Weight: 54.4 kg    Examination:  General exam: Appears calm and comfortable  Respiratory system: Clear to auscultation. Respiratory effort normal. Cardiovascular system: S1 & S2 heard, RRR. No JVD, murmurs, rubs, gallops or clicks. No pedal edema. Gastrointestinal system: Abdomen is nondistended, soft and nontender. No organomegaly or masses felt. Normal bowel sounds heard.  Central nervous system: Alert and oriented. No focal neurological deficits. Extremities: Symmetric 5 x 5 power. Skin: No rashes, lesions or ulcers Psychiatry: Judgement and insight appear normal. Mood & affect appropriate.     Data Reviewed: I have personally reviewed following labs and imaging studies  CBC: Recent Labs  Lab 03/11/19 1648 03/12/19 0320 03/13/19 0442 03/14/19 0653 03/15/19 0458  WBC 13.0* 11.2* 12.0* 10.1 8.5  NEUTROABS  --  8.8* 9.5*  --   --   HGB 10.8* 9.6* 8.7* 8.2* 9.2*  HCT 34.6* 30.5* 28.2* 26.6* 29.9*  MCV 94.0 95.3 94.9 95.0 95.8  PLT 293 252 252 228 262   Basic Metabolic Panel: Recent Labs  Lab 03/11/19 1648 03/12/19 0320 03/12/19 0344 03/13/19 0442 03/14/19 0653 03/15/19 0458  NA 141 138  --  139 141 144  K 3.8 3.4*  --  3.7 4.0 3.9  CL 111 111  --  112* 111 114*  CO2 21* 19*  --  19* 25 25  GLUCOSE 108* 93  --  95 93 98  BUN 24* 16  --  6* 6* 7*  CREATININE 0.91 0.72  --  0.83 0.92 1.04*  CALCIUM 9.2 8.3*  --  8.0* 8.3* 8.3*  MG  --   --  1.6* 1.9  --   --    GFR: Estimated Creatinine Clearance: 39.1 mL/min (A) (by C-G formula based on SCr of 1.04 mg/dL (H)). Liver Function Tests: Recent Labs  Lab 03/11/19 1648 03/12/19 0320 03/13/19 0442 03/14/19 0653 03/15/19 0458  AST 13* 11* 11* 9* 11*  ALT 12 10 7 9 8   ALKPHOS 78 66 55 59 61  BILITOT 0.3 0.5 0.2* 0.2* 0.1*  PROT 7.3 5.8* 5.0* 4.8* 5.3*  ALBUMIN 3.8 3.0* 2.4* 2.3* 2.5*   Recent Labs  Lab 03/11/19 1648  LIPASE 27   No results for input(s): AMMONIA in the last 168 hours. Coagulation Profile: No results for input(s): INR, PROTIME in the last 168 hours. Cardiac Enzymes: No results for input(s): CKTOTAL, CKMB, CKMBINDEX, TROPONINI in the last 168 hours. BNP (last 3 results) No results for input(s): PROBNP in the last 8760 hours. HbA1C: No results for input(s): HGBA1C in the last 72 hours. CBG: No results for input(s): GLUCAP in the last 168 hours. Lipid Profile:  No results for input(s): CHOL, HDL, LDLCALC, TRIG, CHOLHDL, LDLDIRECT in the last 72 hours. Thyroid Function Tests: No results for input(s): TSH, T4TOTAL, FREET4, T3FREE, THYROIDAB in the last 72 hours. Anemia Panel: No results for input(s): VITAMINB12, FOLATE, FERRITIN, TIBC, IRON, RETICCTPCT in the last 72 hours. Sepsis Labs: No results for input(s): PROCALCITON, LATICACIDVEN in the last 168 hours.  Recent Results (from the past 240 hour(s))  C difficile quick scan w PCR reflex     Status: None   Collection Time: 03/11/19  9:22 PM   Specimen: STOOL  Result Value Ref Range Status   C Diff antigen NEGATIVE NEGATIVE Final   C Diff toxin NEGATIVE NEGATIVE Final   C Diff interpretation No C. difficile detected.  Final    Comment: Performed at [facility], [address], [place], [place]  [address]  SARS Coronavirus 2 [facility] order, Performed in [facility] hospital lab) Nasopharyngeal Nasopharyngeal Swab     Status: None   Collection Time: 03/12/19  2:18 AM   Specimen: Nasopharyngeal Swab  Result Value Ref Range Status   SARS Coronavirus 2 NEGATIVE NEGATIVE Final    Comment: (NOTE) If result is NEGATIVE SARS-CoV-2 target nucleic acids are NOT DETECTED. The SARS-CoV-2 RNA is generally detectable in upper and lower  respiratory specimens during the acute phase of infection. The lowest  concentration of SARS-CoV-2 viral copies this assay can detect is 250  copies / mL. A negative result does not preclude SARS-CoV-2 infection  and should not be used as the sole basis for treatment or other  patient management decisions.  A negative result may occur with  improper specimen collection / handling, submission of specimen other  than nasopharyngeal swab, presence of viral mutation(s) within the  areas targeted by this assay, and inadequate number of viral copies  (<250 copies / mL). A negative result must be combined with clinical  observations, patient history, and epidemiological  information. If result is POSITIVE SARS-CoV-2 target nucleic acids are DETECTED. The SARS-CoV-2 RNA is generally detectable in upper and lower  respiratory specimens dur ing the acute phase of infection.  Positive  results are indicative of active infection with SARS-CoV-2.  Clinical  correlation with patient history and other diagnostic information is  necessary to determine patient infection status.  Positive results do  not rule out bacterial infection or co-infection with other viruses. If result is PRESUMPTIVE POSTIVE SARS-CoV-2 nucleic acids MAY BE PRESENT.   A presumptive positive result was obtained on the submitted specimen  and confirmed on repeat testing.  While 2019 novel coronavirus  (SARS-CoV-2) nucleic acids may be present in the submitted sample  additional confirmatory testing may be necessary for epidemiological  and / or clinical management purposes  to differentiate between  SARS-CoV-2 and other Sarbecovirus currently known to infect humans.  If clinically indicated additional testing with an alternate test  methodology [phone]) is advised. The SARS-CoV-2 RNA is generally  detectable in upper and lower respiratory sp ecimens during the acute  phase of infection. The expected result is Negative. Fact Sheet for Patients:  BoilerBrush.com.cy Fact Sheet for Healthcare Providers: https://pope.com/ This test is not yet approved or cleared by the [place] FDA and has been authorized for detection and/or diagnosis of SARS-CoV-2 by FDA under an Emergency Use Authorization (EUA).  This EUA will remain in effect (meaning this test can be used) for the duration of the COVID-19 declaration under Section 564(b)(1) of the Act, 21 U.S.C. section 360bbb-3(b)(1), unless the authorization is terminated or revoked sooner. Performed at [facility], [address], [place], [place] [address]   MRSA PCR Screening     Status: None    Collection Time: 03/12/19  3:56 AM   Specimen: Nasal Mucosa; Nasopharyngeal  Result Value Ref Range Status   MRSA by PCR NEGATIVE NEGATIVE Final    Comment:        The GeneXpert MRSA Assay (FDA approved for NASAL specimens only), is one component of a comprehensive MRSA colonization surveillance program. It is not intended to diagnose MRSA infection nor to guide or monitor treatment for MRSA infections. Performed at [facility], [address], [place], [place] [address]          Radiology Studies: No results found.      Scheduled Meds: . Chlorhexidine Gluconate Cloth  6 each Topical Daily  .  influenza vaccine adjuvanted  0.5 mL Intramuscular Tomorrow-1000  . ondansetron (ZOFRAN) IV  4 mg Intravenous TID WC & HS  . pantoprazole  40 mg Oral Daily  . vancomycin  125 mg Oral Q6H   Continuous Infusions:   LOS: 3 days    Time spent: 30 minutes    Deby Adger Hoover Brunette, DO [facility] Pager [phone]  If 7PM-7AM, please contact night-coverage [email or web address] Password [email or web address] 03/15/2019, 11:44 AM

## 2019-03-15 NOTE — Progress Notes (Signed)
Subjective: Still with some mid- to lower abdominal discomfort. Feels better overall. Had a bowel movement this morning and left in the hat to show staff. Denies N/V. Tolerating diet. No other GI complaints.  Objective: Vital signs in last 24 hours: Temp:  [97.8 F (36.6 C)-98.1 F (36.7 C)] 98 F (36.7 C) (10/09 0516) Pulse Rate:  [72-74] 72 (10/09 0516) Resp:  [16] 16 (10/09 0516) BP: (98-111)/(50-66) 111/66 (10/09 0516) SpO2:  [98 %-100 %] 100 % (10/09 0516) Last BM Date: 03/15/19 General:   Alert and oriented, pleasant Head:  Normocephalic and atraumatic. Eyes:  No icterus, sclera clear. Conjuctiva pink.  Heart:  S1, S2 present, no murmurs noted.  Lungs: Clear to auscultation bilaterally, without wheezing, rales, or rhonchi.  Abdomen:  Bowel sounds present, soft, non-distended. Mild to moderate generalized TTP. No HSM or hernias noted. No rebound or guarding. No masses appreciated  Msk:  Symmetrical without gross deformities. Pulses:  Normal bilateral DP pulses noted. Extremities:  Without clubbing or edema. Neurologic:  Alert and  oriented x4;  grossly normal neurologically. Psych:  Alert and cooperative. Normal mood and affect.  Intake/Output from previous day: 10/08 0701 - 10/09 0700 In: 1140 [P.O.:840; I.V.:300] Out: -  Intake/Output this shift: Total I/O In: 240 [P.O.:240] Out: -   Lab Results: Recent Labs    03/13/19 0442 03/14/19 0653 03/15/19 0458  WBC 12.0* 10.1 8.5  HGB 8.7* 8.2* 9.2*  HCT 28.2* 26.6* 29.9*  PLT 252 228 262   BMET Recent Labs    03/13/19 0442 03/14/19 0653 03/15/19 0458  NA 139 141 144  K 3.7 4.0 3.9  CL 112* 111 114*  CO2 19* 25 25  GLUCOSE 95 93 98  BUN 6* 6* 7*  CREATININE 0.83 0.92 1.04*  CALCIUM 8.0* 8.3* 8.3*   LFT Recent Labs    03/13/19 0442 03/14/19 0653 03/15/19 0458  PROT 5.0* 4.8* 5.3*  ALBUMIN 2.4* 2.3* 2.5*  AST 11* 9* 11*  ALT 7 9 8   ALKPHOS 55 59 61  BILITOT 0.2* 0.2* 0.1*   PT/INR No  results for input(s): LABPROT, INR in the last 72 hours. Hepatitis Panel No results for input(s): HEPBSAG, HCVAB, HEPAIGM, HEPBIGM in the last 72 hours.   Studies/Results: No results found.  Assessment: 68 year old female admitted with acute diarrhea and abdominal pain with findings of pancolitis on CT, C. difficile negative, GI pathogen panel pending.  Initially received oral vancomycin x2 doses but discontinued once C. difficile was negative.  Some slight improvement temporarily with consistency of stools.  Being treated empirically for C. difficile given high suspicion, recent exposure to antibiotics, leukocytosis, hypoalbuminemia, pancolitis on CT. Moderate abdominal pain persisting. Leukocytosis resolved. Stool this morning consistent with [place] 5-6.  Normocytic anemia: B12 low, ferritin 63, iron 42, folate 15.2. Suspect some hemodilution effect. No signs of overt bleeding. Received B12 injection yesterday  Plan: 1. Monitor for GI bleed 2. Monitor hgb 3. Continue regular/heart healthy diet given tolerance 4. Continue abx for a full course of CDiff treatment (120m qid x 10 days); has received about 3 days so far 5. Plan for outpatient colonoscopy 6. Will need outpatient GI follow-up in 4 weeks. 7. Supportive measures 8. Anticipate d/c in the next 24-48 hours pending clinical progress   Thank you for allowing [facility] participate in the care of PChamisal DNP, AGNP-C Adult & Gerontological Nurse Practitioner [facility]     LOS: 3 days  03/15/2019, 12:15 PM

## 2019-03-15 NOTE — Care Management Important Message (Signed)
Important Message  Patient Details  Name: Rebecca Osborne MRN: [id number] Date of Birth: Jan 03, 1951   Medicare Important Message Given:  Yes     Tommy Medal 03/15/2019, 12:35 PM

## 2019-03-15 NOTE — Progress Notes (Signed)
Ate about 50% of breakfast and said she did have a little more pain afterwards.  Small soft, brown bm in hat with small amount of mucous.

## 2019-03-16 LAB — COMPREHENSIVE METABOLIC PANEL
ALT: 9 U/L (ref 0–44)
AST: 11 U/L — ABNORMAL LOW (ref 15–41)
Albumin: 2.7 g/dL — ABNORMAL LOW (ref 3.5–5.0)
Alkaline Phosphatase: 64 U/L (ref 38–126)
Anion gap: 7 (ref 5–15)
BUN: 11 mg/dL (ref 8–23)
CO2: 27 mmol/L (ref 22–32)
Calcium: 8.3 mg/dL — ABNORMAL LOW (ref 8.9–10.3)
Chloride: 108 mmol/L (ref 98–111)
Creatinine, Ser: 0.94 mg/dL (ref 0.44–1.00)
GFR calc Af Amer: >60 mL/min (ref >60)
GFR calc non Af Amer: >60 mL/min (ref >60)
Glucose, Bld: 93 mg/dL (ref 70–99)
Potassium: 3.5 mmol/L (ref 3.5–5.1)
Sodium: 142 mmol/L (ref 135–145)
Total Bilirubin: 0.2 mg/dL — ABNORMAL LOW (ref 0.3–1.2)
Total Protein: 5.6 g/dL — ABNORMAL LOW (ref 6.5–8.1)

## 2019-03-16 LAB — CBC
HCT: 30.7 % — ABNORMAL LOW (ref 36.0–46.0)
Hemoglobin: 9.6 g/dL — ABNORMAL LOW (ref 12.0–15.0)
MCH: 29.4 pg (ref 26.0–34.0)
MCHC: 31.3 g/dL (ref 30.0–36.0)
MCV: 93.9 fL (ref 80.0–100.0)
Platelets: 297 10*3/uL (ref 150–400)
RBC: 3.27 MIL/uL — ABNORMAL LOW (ref 3.87–5.11)
RDW: 13.2 % (ref 11.5–15.5)
WBC: 7.4 10*3/uL (ref 4.0–10.5)
nRBC: 0 % (ref 0.0–0.2)

## 2019-03-16 LAB — MAGNESIUM: Magnesium: 1.5 mg/dL — ABNORMAL LOW (ref 1.7–2.4)

## 2019-03-16 MED ORDER — PANTOPRAZOLE SODIUM 40 MG PO TBEC: 40.0000 mg | DELAYED_RELEASE_TABLET | Freq: Every day | ORAL | 0 refills | Status: DC

## 2019-03-16 MED ORDER — VANCOMYCIN 50 MG/ML ORAL SOLUTION: 125.0000 mg | Freq: Four times a day (QID) | ORAL | 0 refills | Status: AC

## 2019-03-16 MED ORDER — CYANOCOBALAMIN 1000 MCG PO TABS: 1000.0000 ug | ORAL_TABLET | Freq: Every day | ORAL | 0 refills | Status: DC

## 2019-03-16 MED ORDER — MAGNESIUM GLUCONATE 30 MG PO TABS: 30.0000 mg | ORAL_TABLET | Freq: Two times a day (BID) | ORAL | 0 refills | Status: AC

## 2019-03-16 NOTE — Progress Notes (Signed)
Nsg Discharge Note  Admit Date:  03/11/2019 Discharge date: 03/16/2019   PILAR WESTERGAARD to be D/C'd Home per MD order.  AVS completed.  Copy for chart, and copy for patient signed, and dated. Removed IV-clean, dry, intact. Reviewed d/c paperwork with patient. Discussed new medications and where to pick up. Answered all  Questions. Wheeled stable patient and belongings to ED entrance where she walked to her car to d/c to home. Patient/caregiver able to verbalize understanding.  Discharge Medication: Allergies as of 03/16/2019      Reactions   Codeine Itching      Medication List    TAKE these medications   cyanocobalamin 1000 MCG tablet Take 1 tablet (1,000 mcg total) by mouth daily. Start taking on: March 17, 2019   DULoxetine 30 MG capsule Commonly known as: Cymbalta Take 1 capsule (30 mg total) by mouth daily.   ibuprofen 600 MG tablet Commonly known as: ADVIL Take 1 tablet (600 mg total) by mouth every 8 (eight) hours as needed.   lidocaine 5 % ointment Commonly known as: XYLOCAINE Apply 1 application topically as needed.   magnesium gluconate 30 MG tablet Commonly known as: MAGONATE Take 1 tablet (30 mg total) by mouth 2 (two) times daily for 10 days.   pantoprazole 40 MG tablet Commonly known as: PROTONIX Take 1 tablet (40 mg total) by mouth daily. Start taking on: March 17, 2019   vancomycin 50 mg/mL  oral solution Commonly known as: VANCOCIN Take 2.5 mLs (125 mg total) by mouth every 6 (six) hours for 7 days.   Vitamin D (Ergocalciferol) 1.25 MG (50000 UT) Caps capsule Commonly known as: DRISDOL Take 1 capsule (50,000 Units total) by mouth every 7 (seven) days.       Discharge Assessment: Vitals:   03/16/19 0531 03/16/19 1018  BP: 100/64   Pulse: 70   Resp: 16   Temp: 97.8 F (36.6 C)   SpO2: 99% 95%   Skin clean, dry and intact without evidence of skin break down, no evidence of skin tears noted. IV catheter discontinued intact. Site without  signs and symptoms of complications - no redness or edema noted at insertion site, patient denies c/o pain - only slight tenderness at site.  Dressing with slight pressure applied.  D/c Instructions-Education: Discharge instructions given to patient/family with verbalized understanding. D/c education completed with patient/family including follow up instructions, medication list, d/c activities limitations if indicated, with other d/c instructions as indicated by MD - patient able to verbalize understanding, all questions fully answered. Patient instructed to return to ED, call 911, or call MD for any changes in condition.  Patient escorted via [place], and D/C home via private auto.  Santa Lighter, RN 03/16/2019 1:25 PM

## 2019-03-16 NOTE — Discharge Summary (Signed)
Physician Discharge Summary  Rebecca Osborne WGY:[id number] DOB: 1950-06-30 DOA: 03/11/2019  PCP: Susy Frizzle, MD  Admit date: 03/11/2019  Discharge date: 03/16/2019  Admitted From:Home  Disposition:  Home  Recommendations for Outpatient Follow-up:  1. Follow up with PCP in 1-2 weeks 2. Follow-up with GI Dr. Oneida Alar in 4 weeks 3. Continue on oral vancomycin as prescribed for 7 more days to complete 10-day course of treatment 4. Continue on B12 supplementation as well as magnesium as prescribed  Home Health: None  Equipment/Devices: None  Discharge Condition: Stable  CODE STATUS: Full  Diet recommendation: Heart Healthy  Brief/Interim Summary: 68 y.o.femalewith medical history significant ofseasonal allergies, unspecified great cervical cancer, GERD, osteopeniawho is coming to the emergency department complaints of diarrheaand abdominal painfor the past 7 days.She had completed a course of cephalexin for a UTI recently and there was concern for C diff but the test was negative. She continues to have diarrhea concerning for an infectious cause.  10/7: Patient continues to have ongoing lower abdominal pain and cramping. She continues to have very frequent, loose bowel movements noted. She is tolerating diet. GI panel pending.  10/8:Patient continues to have ongoing lower abdominal pain, but is tolerating full liquid diet. Stools are becoming more formed.  10/9: Patient states that lower abdominal pain is starting to improve and has been started on a regular diet which she is tolerating somewhat.  Stools are more formed as well and she has less frequency.  Discussed case with GI who feels that she may be appropriate for discharge potentially in the next 24 to 48 hours.  10/10: Patient appears stable for discharge today and denies any further significant abdominal pain.  She has been tolerating her diet and has no further loose stools.  She is to continue on her  oral vancomycin as prescribed for 7 more days to complete 10-day course of treatment and follow-up with GI in 4 weeks.  No other acute events noted throughout the course of this admission.  She has been noted to have some low B12 and magnesium levels which will be supplemented and will need follow-up.  Discharge Diagnoses:  Principal Problem:   Pancolitis ([place]) Active Problems:   GERD   Normocytic anemia   Diarrhea of presumed infectious origin  Principal discharge diagnosis: Pancolitis likely secondary to Clostridium difficile.  Discharge Instructions  Discharge Instructions    Diet - low sodium heart healthy   Complete by: As directed    Increase activity slowly   Complete by: As directed      Allergies as of 03/16/2019      Reactions   Codeine Itching      Medication List    TAKE these medications   cyanocobalamin 1000 MCG tablet Take 1 tablet (1,000 mcg total) by mouth daily. Start taking on: March 17, 2019   DULoxetine 30 MG capsule Commonly known as: Cymbalta Take 1 capsule (30 mg total) by mouth daily.   ibuprofen 600 MG tablet Commonly known as: ADVIL Take 1 tablet (600 mg total) by mouth every 8 (eight) hours as needed.   lidocaine 5 % ointment Commonly known as: XYLOCAINE Apply 1 application topically as needed.   magnesium gluconate 30 MG tablet Commonly known as: MAGONATE Take 1 tablet (30 mg total) by mouth 2 (two) times daily for 10 days.   pantoprazole 40 MG tablet Commonly known as: PROTONIX Take 1 tablet (40 mg total) by mouth daily. Start taking on: March 17, 2019   vancomycin  50 mg/mL  oral solution Commonly known as: VANCOCIN Take 2.5 mLs (125 mg total) by mouth every 6 (six) hours for 7 days.   Vitamin D (Ergocalciferol) 1.25 MG (50000 UT) Caps capsule Commonly known as: DRISDOL Take 1 capsule (50,000 Units total) by mouth every 7 (seven) days.      Follow-up Information    Susy Frizzle, MD Follow up in 1 week(s).    Specialty: Family Medicine Contact information: [address] [place] [address] [phone]        Danie Binder, MD Follow up in 4 week(s).   Specialty: Gastroenterology Contact information: [place] [address] [phone]          Allergies  Allergen Reactions  . Codeine Itching    Consultations:  GI   Procedures/Studies: Ct Abdomen Pelvis W Contrast  Result Date: 03/11/2019 CLINICAL DATA:  Lower abdominal pain with diarrhea history of bowel resection EXAM: CT ABDOMEN AND PELVIS WITH CONTRAST TECHNIQUE: Multidetector CT imaging of the abdomen and pelvis was performed using the standard protocol following bolus administration of intravenous contrast. CONTRAST:  124m OMNIPAQUE IOHEXOL 300 MG/ML  SOLN COMPARISON:  CT 08/17/2018, 09/07/2016 FINDINGS: Lower chest: Lung bases demonstrate no acute consolidation or effusion. The heart size is normal. Small hiatal hernia Hepatobiliary: Probable focal fat infiltration near the inferior falciform ligament. Status post cholecystectomy. Stable extrahepatic biliary dilatation with common duct dilated up to 12 mm. Pancreas: Unremarkable. No pancreatic ductal dilatation or surrounding inflammatory changes. Spleen: Normal in size without focal abnormality. Adrenals/Urinary Tract: Adrenal glands are normal. Cyst upper pole left kidney. Cortical scarring at the upper poles of both kidneys. No hydronephrosis. Urinary bladder is unremarkable. Stomach/Bowel: The stomach is nonenlarged. No dilated small bowel. Diffuse wall thickening and mucosal enhancement of the entire colon. Appendix not well seen but no right lower quadrant inflammatory process. Postsurgical changes within the pelvis. Vascular/Lymphatic: Moderate aortic atherosclerosis. No aneurysm. Small right lower quadrant mesenteric nodes. Reproductive: Status post hysterectomy. No adnexal masses. Other: Negative for free air or free fluid Musculoskeletal: No  acute or suspicious osseous abnormality IMPRESSION: 1. Diffuse wall thickening and mucosal enhancement of the entire colon consistent with pancolitis, which may be secondary to infection to include C difficile colitis, inflammatory bowel disease, or less likely ischemic disease. Negative for bowel obstruction or perforation. 2. Status post cholecystectomy with stable enlargement of the extrahepatic bile duct Electronically Signed   By: KDonavan FoilM.D.   On: 03/11/2019 22:58    Discharge Exam: Vitals:   03/16/19 0531 03/16/19 1018  BP: 100/64   Pulse: 70   Resp: 16   Temp: 97.8 F (36.6 C)   SpO2: 99% 95%   Vitals:   03/15/19 1316 03/15/19 2206 03/16/19 0531 03/16/19 1018  BP: 113/66 (!) 125/54 100/64   Pulse: 79 78 70   Resp: 18 16 16    Temp: 98.2 F (36.8 C) 98.9 F (37.2 C) 97.8 F (36.6 C)   TempSrc: Oral Oral Oral   SpO2: 99% 100% 99% 95%  Weight:      Height:        General: Pt is alert, awake, not in acute distress Cardiovascular: RRR, S1/S2 +, no rubs, no gallops Respiratory: CTA bilaterally, no wheezing, no rhonchi Abdominal: Soft, NT, ND, bowel sounds + Extremities: no edema, no cyanosis    The results of significant diagnostics from this hospitalization (including imaging, microbiology, ancillary and laboratory) are listed below for reference.  Microbiology: Recent Results (from the past 240 hour(s))  C difficile quick scan w PCR reflex     Status: None   Collection Time: 03/11/19  9:22 PM   Specimen: STOOL  Result Value Ref Range Status   C Diff antigen NEGATIVE NEGATIVE Final   C Diff toxin NEGATIVE NEGATIVE Final   C Diff interpretation No C. difficile detected.  Final    Comment: Performed at [facility], [address] [place], [place] [address]  SARS Coronavirus 2 [facility] order, Performed in [facility] hospital lab) Nasopharyngeal Nasopharyngeal Swab     Status: None   Collection Time: 03/12/19  2:18 AM   Specimen: Nasopharyngeal Swab   Result Value Ref Range Status   SARS Coronavirus 2 NEGATIVE NEGATIVE Final    Comment: (NOTE) If result is NEGATIVE SARS-CoV-2 target nucleic acids are NOT DETECTED. The SARS-CoV-2 RNA is generally detectable in upper and lower  respiratory specimens during the acute phase of infection. The lowest  concentration of SARS-CoV-2 viral copies this assay can detect is 250  copies / mL. A negative result does not preclude SARS-CoV-2 infection  and should not be used as the sole basis for treatment or other  patient management decisions.  A negative result may occur with  improper specimen collection / handling, submission of specimen other  than nasopharyngeal swab, presence of viral mutation(s) within the  areas targeted by this assay, and inadequate number of viral copies  (<250 copies / mL). A negative result must be combined with clinical  observations, patient history, and epidemiological information. If result is POSITIVE SARS-CoV-2 target nucleic acids are DETECTED. The SARS-CoV-2 RNA is generally detectable in upper and lower  respiratory specimens dur ing the acute phase of infection.  Positive  results are indicative of active infection with SARS-CoV-2.  Clinical  correlation with patient history and other diagnostic information is  necessary to determine patient infection status.  Positive results do  not rule out bacterial infection or co-infection with other viruses. If result is PRESUMPTIVE POSTIVE SARS-CoV-2 nucleic acids MAY BE PRESENT.   A presumptive positive result was obtained on the submitted specimen  and confirmed on repeat testing.  While 2019 novel coronavirus  (SARS-CoV-2) nucleic acids may be present in the submitted sample  additional confirmatory testing may be necessary for epidemiological  and / or clinical management purposes  to differentiate between  SARS-CoV-2 and other Sarbecovirus currently known to infect humans.  If clinically indicated additional  testing with an alternate test  methodology [phone]) is advised. The SARS-CoV-2 RNA is generally  detectable in upper and lower respiratory sp ecimens during the acute  phase of infection. The expected result is Negative. Fact Sheet for Patients:  StrictlyIdeas.no Fact Sheet for Healthcare Providers: BankingDealers.co.za This test is not yet approved or cleared by the [place] FDA and has been authorized for detection and/or diagnosis of SARS-CoV-2 by FDA under an Emergency Use Authorization (EUA).  This EUA will remain in effect (meaning this test can be used) for the duration of the COVID-19 declaration under Section 564(b)(1) of the Act, 21 U.S.C. section 360bbb-3(b)(1), unless the authorization is terminated or revoked sooner. Performed at [facility], [address], [place], [place] [address]   MRSA PCR Screening     Status: None   Collection Time: 03/12/19  3:56 AM   Specimen: Nasal Mucosa; Nasopharyngeal  Result Value Ref Range Status   MRSA by PCR NEGATIVE NEGATIVE Final    Comment:  The GeneXpert MRSA Assay (FDA approved for NASAL specimens only), is one component of a comprehensive MRSA colonization surveillance program. It is not intended to diagnose MRSA infection nor to guide or monitor treatment for MRSA infections. Performed at [facility], [address], [place], [place] [address]   GI pathogen panel by PCR, stool     Status: None   Collection Time: 03/12/19  1:00 PM   Specimen: Stool  Result Value Ref Range Status   Plesiomonas shigelloides NOT DETECTED NOT DETECTED Final   Yersinia enterocolitica NOT DETECTED NOT DETECTED Final   Vibrio NOT DETECTED NOT DETECTED Final   Enteropathogenic E coli NOT DETECTED NOT DETECTED Final   E coli (ETEC) LT/ST NOT DETECTED NOT DETECTED Final   E coli 7425 by PCR Not applicable NOT DETECTED Final   Cryptosporidium by PCR NOT DETECTED NOT DETECTED Final    Entamoeba histolytica NOT DETECTED NOT DETECTED Final   Adenovirus F 40/41 NOT DETECTED NOT DETECTED Final   Norovirus GI/GII NOT DETECTED NOT DETECTED Final   Sapovirus NOT DETECTED NOT DETECTED Final    Comment: (NOTE) Performed At: [facility] [place], [place] [id number] Rush Farmer MD PP:[id number]    Vibrio cholerae NOT DETECTED NOT DETECTED Final   Campylobacter by PCR NOT DETECTED NOT DETECTED Final   Salmonella by PCR NOT DETECTED NOT DETECTED Final   E coli (STEC) NOT DETECTED NOT DETECTED Final   Enteroaggregative E coli NOT DETECTED NOT DETECTED Final   Shigella by PCR NOT DETECTED NOT DETECTED Final   Cyclospora cayetanensis NOT DETECTED NOT DETECTED Final   Astrovirus NOT DETECTED NOT DETECTED Final   G lamblia by PCR NOT DETECTED NOT DETECTED Final   Rotavirus A by PCR NOT DETECTED NOT DETECTED Final     Labs: BNP (last 3 results) No results for input(s): BNP in the last 8760 hours. Basic Metabolic Panel: Recent Labs  Lab 03/12/19 0320 03/12/19 0344 03/13/19 0442 03/14/19 0653 03/15/19 0458 03/16/19 0625  NA 138  --  139 141 144 142  K 3.4*  --  3.7 4.0 3.9 3.5  CL 111  --  112* 111 114* 108  CO2 19*  --  19* 25 25 27   GLUCOSE 93  --  95 93 98 93  BUN 16  --  6* 6* 7* 11  CREATININE 0.72  --  0.83 0.92 1.04* 0.94  CALCIUM 8.3*  --  8.0* 8.3* 8.3* 8.3*  MG  --  1.6* 1.9  --   --  1.5*   Liver Function Tests: Recent Labs  Lab 03/12/19 0320 03/13/19 0442 03/14/19 0653 03/15/19 0458 03/16/19 0625  AST 11* 11* 9* 11* 11*  ALT 10 7 9 8 9   ALKPHOS 66 55 59 61 64  BILITOT 0.5 0.2* 0.2* 0.1* 0.2*  PROT 5.8* 5.0* 4.8* 5.3* 5.6*  ALBUMIN 3.0* 2.4* 2.3* 2.5* 2.7*   Recent Labs  Lab 03/11/19 1648  LIPASE 27   No results for input(s): AMMONIA in the last 168 hours. CBC: Recent Labs  Lab 03/12/19 0320 03/13/19 0442 03/14/19 0653 03/15/19 0458 03/16/19 0625  WBC 11.2* 12.0* 10.1 8.5 7.4  NEUTROABS 8.8* 9.5*  --   --    --   HGB 9.6* 8.7* 8.2* 9.2* 9.6*  HCT 30.5* 28.2* 26.6* 29.9* 30.7*  MCV 95.3 94.9 95.0 95.8 93.9  PLT 252 252 228 262 297   Cardiac Enzymes: No results for input(s): CKTOTAL, CKMB, CKMBINDEX, TROPONINI in the last  168 hours. BNP: Invalid input(s): POCBNP CBG: No results for input(s): GLUCAP in the last 168 hours. D-Dimer No results for input(s): DDIMER in the last 72 hours. Hgb A1c No results for input(s): HGBA1C in the last 72 hours. Lipid Profile No results for input(s): CHOL, HDL, LDLCALC, TRIG, CHOLHDL, LDLDIRECT in the last 72 hours. Thyroid function studies No results for input(s): TSH, T4TOTAL, T3FREE, THYROIDAB in the last 72 hours.  Invalid input(s): FREET3 Anemia work up No results for input(s): VITAMINB12, FOLATE, FERRITIN, TIBC, IRON, RETICCTPCT in the last 72 hours. Urinalysis    Component Value Date/Time   COLORURINE STRAW (A) 03/12/2019 0329   APPEARANCEUR CLEAR 03/12/2019 0329   LABSPEC 1.033 (H) 03/12/2019 0329   PHURINE 5.0 03/12/2019 0329   GLUCOSEU NEGATIVE 03/12/2019 0329   HGBUR NEGATIVE 03/12/2019 0329   BILIRUBINUR NEGATIVE 03/12/2019 0329   KETONESUR 5 (A) 03/12/2019 0329   PROTEINUR NEGATIVE 03/12/2019 0329   UROBILINOGEN 0.2 02/21/2019 1940   NITRITE NEGATIVE 03/12/2019 0329   LEUKOCYTESUR NEGATIVE 03/12/2019 0329   Sepsis Labs Invalid input(s): PROCALCITONIN,  WBC,  LACTICIDVEN Microbiology Recent Results (from the past 240 hour(s))  C difficile quick scan w PCR reflex     Status: None   Collection Time: 03/11/19  9:22 PM   Specimen: STOOL  Result Value Ref Range Status   C Diff antigen NEGATIVE NEGATIVE Final   C Diff toxin NEGATIVE NEGATIVE Final   C Diff interpretation No C. difficile detected.  Final    Comment: Performed at [facility], [address] [place], [place] [address]  SARS Coronavirus 2 [facility] order, Performed in [facility] hospital lab) Nasopharyngeal Nasopharyngeal Swab     Status: None   Collection Time:  03/12/19  2:18 AM   Specimen: Nasopharyngeal Swab  Result Value Ref Range Status   SARS Coronavirus 2 NEGATIVE NEGATIVE Final    Comment: (NOTE) If result is NEGATIVE SARS-CoV-2 target nucleic acids are NOT DETECTED. The SARS-CoV-2 RNA is generally detectable in upper and lower  respiratory specimens during the acute phase of infection. The lowest  concentration of SARS-CoV-2 viral copies this assay can detect is 250  copies / mL. A negative result does not preclude SARS-CoV-2 infection  and should not be used as the sole basis for treatment or other  patient management decisions.  A negative result may occur with  improper specimen collection / handling, submission of specimen other  than nasopharyngeal swab, presence of viral mutation(s) within the  areas targeted by this assay, and inadequate number of viral copies  (<250 copies / mL). A negative result must be combined with clinical  observations, patient history, and epidemiological information. If result is POSITIVE SARS-CoV-2 target nucleic acids are DETECTED. The SARS-CoV-2 RNA is generally detectable in upper and lower  respiratory specimens dur ing the acute phase of infection.  Positive  results are indicative of active infection with SARS-CoV-2.  Clinical  correlation with patient history and other diagnostic information is  necessary to determine patient infection status.  Positive results do  not rule out bacterial infection or co-infection with other viruses. If result is PRESUMPTIVE POSTIVE SARS-CoV-2 nucleic acids MAY BE PRESENT.   A presumptive positive result was obtained on the submitted specimen  and confirmed on repeat testing.  While 2019 novel coronavirus  (SARS-CoV-2) nucleic acids may be present in the submitted sample  additional confirmatory testing may be necessary for epidemiological  and / or clinical management purposes  to differentiate between  SARS-CoV-2 and  other Sarbecovirus currently known to  infect humans.  If clinically indicated additional testing with an alternate test  methodology [phone]) is advised. The SARS-CoV-2 RNA is generally  detectable in upper and lower respiratory sp ecimens during the acute  phase of infection. The expected result is Negative. Fact Sheet for Patients:  StrictlyIdeas.no Fact Sheet for Healthcare Providers: BankingDealers.co.za This test is not yet approved or cleared by the [place] FDA and has been authorized for detection and/or diagnosis of SARS-CoV-2 by FDA under an Emergency Use Authorization (EUA).  This EUA will remain in effect (meaning this test can be used) for the duration of the COVID-19 declaration under Section 564(b)(1) of the Act, 21 U.S.C. section 360bbb-3(b)(1), unless the authorization is terminated or revoked sooner. Performed at [facility], [address], [place], [place] [address]   MRSA PCR Screening     Status: None   Collection Time: 03/12/19  3:56 AM   Specimen: Nasal Mucosa; Nasopharyngeal  Result Value Ref Range Status   MRSA by PCR NEGATIVE NEGATIVE Final    Comment:        The GeneXpert MRSA Assay (FDA approved for NASAL specimens only), is one component of a comprehensive MRSA colonization surveillance program. It is not intended to diagnose MRSA infection nor to guide or monitor treatment for MRSA infections. Performed at [facility], [address], [place], [place] [address]   GI pathogen panel by PCR, stool     Status: None   Collection Time: 03/12/19  1:00 PM   Specimen: Stool  Result Value Ref Range Status   Plesiomonas shigelloides NOT DETECTED NOT DETECTED Final   Yersinia enterocolitica NOT DETECTED NOT DETECTED Final   Vibrio NOT DETECTED NOT DETECTED Final   Enteropathogenic E coli NOT DETECTED NOT DETECTED Final   E coli (ETEC) LT/ST NOT DETECTED NOT DETECTED Final   E coli 6861 by PCR Not applicable NOT DETECTED Final    Cryptosporidium by PCR NOT DETECTED NOT DETECTED Final   Entamoeba histolytica NOT DETECTED NOT DETECTED Final   Adenovirus F 40/41 NOT DETECTED NOT DETECTED Final   Norovirus GI/GII NOT DETECTED NOT DETECTED Final   Sapovirus NOT DETECTED NOT DETECTED Final    Comment: (NOTE) Performed At: [facility] [place], [place] [id number] Rush Farmer MD JD:[id number]    Vibrio cholerae NOT DETECTED NOT DETECTED Final   Campylobacter by PCR NOT DETECTED NOT DETECTED Final   Salmonella by PCR NOT DETECTED NOT DETECTED Final   E coli (STEC) NOT DETECTED NOT DETECTED Final   Enteroaggregative E coli NOT DETECTED NOT DETECTED Final   Shigella by PCR NOT DETECTED NOT DETECTED Final   Cyclospora cayetanensis NOT DETECTED NOT DETECTED Final   Astrovirus NOT DETECTED NOT DETECTED Final   G lamblia by PCR NOT DETECTED NOT DETECTED Final   Rotavirus A by PCR NOT DETECTED NOT DETECTED Final     Time coordinating discharge: 35 minutes  SIGNED:   Rodena Goldmann, DO [facility] 03/16/2019, 11:06 AM  If 7PM-7AM, please contact night-coverage [email or web address] Password [email or web address]

## 2019-03-16 NOTE — Plan of Care (Signed)
  Problem: Education: Goal: Knowledge of General Education information will improve Description: Including pain rating scale, medication(s)/side effects and non-pharmacologic comfort measures Outcome: Adequate for Discharge   Problem: Health Behavior/Discharge Planning: Goal: Ability to manage health-related needs will improve Outcome: Adequate for Discharge   Problem: Clinical Measurements: Goal: Ability to maintain clinical measurements within normal limits will improve Outcome: Adequate for Discharge Goal: Will remain free from infection Outcome: Adequate for Discharge Goal: Diagnostic test results will improve Outcome: Adequate for Discharge Goal: Respiratory complications will improve Outcome: Adequate for Discharge Goal: Cardiovascular complication will be avoided Outcome: Adequate for Discharge   Problem: Activity: Goal: Risk for activity intolerance will decrease Outcome: Adequate for Discharge   Problem: Nutrition: Goal: Adequate nutrition will be maintained Outcome: Adequate for Discharge   Problem: Coping: Goal: Level of anxiety will decrease Outcome: Adequate for Discharge   Problem: Elimination: Goal: Will not experience complications related to bowel motility Outcome: Adequate for Discharge Goal: Will not experience complications related to urinary retention Outcome: Adequate for Discharge   Problem: Pain Managment: Goal: General experience of comfort will improve Outcome: Adequate for Discharge   Problem: Skin Integrity: Goal: Risk for impaired skin integrity will decrease Outcome: Adequate for Discharge   Problem: Safety: Goal: Ability to remain free from injury will improve Outcome: Adequate for Discharge

## 2019-03-19 ENCOUNTER — Other Ambulatory Visit: Payer: Self-pay | Admitting: Podiatry

## 2019-03-19 ENCOUNTER — Other Ambulatory Visit: Payer: Self-pay | Admitting: *Deleted

## 2019-03-19 NOTE — Patient Outreach (Signed)
Red EMMI flags noted for general discharge, outreach call to pt to address red flags, spoke with pt, HIPAA verified, the following EMMI red flags addressed: "Knows who to call for change in condition- no"- per pt this is not true, states she does know who to call and is able to call her primary provider for any concerns. "Scheduled follow up- no"- pt states she has not called to schedule her follow up appointment but will do so today or tomorrow and she is able to do so. "Unfilled prescriptions- yes"- pt states she has a " solution by mouth" being delivered today and she will have all medications. No other concerns voiced.  Jacqlyn Larsen [facility], [place] Coordinator [phone]

## 2019-03-19 NOTE — Telephone Encounter (Signed)
Patient sent in a request to have the vitamin D refilled and I (Jody Silas-cma) was looking at the chart note from the last visit with Dr Jacqualyn Posey which was 01-29-2019 and per Dr Leigh Aurora dictation wanted the patient to follow up in a month and have the vitamin D rechecked and I called the patient to see how she was doing and the patient stated that she had been in the hospital for about a week with a UTI and the antibiotic that they gave her caused her to have (MSRA? This is what the patient stated) and they discontinued the antibiotic(cephalexin 563m) and patient was discharged on Saturday 03/16/2019 and patient also stated that her feet were still bothering her and I stated that I would look into it and give patient a call back after I found out what Dr WJacqualyn Poseywould like to do about the Vitamin D. LLattie Haw

## 2019-03-22 ENCOUNTER — Other Ambulatory Visit: Payer: Self-pay | Admitting: *Deleted

## 2019-03-22 NOTE — Patient Outreach (Signed)
Red EMMI flag for 03/22/19- pt does not have follow up appointment scheduled, outreach call to pt, spoke with pt who reports she has not made the appointment and "will try to do that today"  Pt states she does not need assistance with making the appointment.    Jacqlyn Larsen [facility], [place] Coordinator [phone]

## 2019-03-25 DIAGNOSIS — M25571 Pain in right ankle and joints of right foot: Secondary | ICD-10-CM | POA: Diagnosis not present

## 2019-03-25 DIAGNOSIS — M25572 Pain in left ankle and joints of left foot: Secondary | ICD-10-CM | POA: Diagnosis not present

## 2019-03-26 ENCOUNTER — Ambulatory Visit (INDEPENDENT_AMBULATORY_CARE_PROVIDER_SITE_OTHER): Payer: Medicare Other | Admitting: Family Medicine

## 2019-03-26 VITALS — BP 118/70 | HR 82 | Temp 98.6°F | Resp 14 | Ht 59.0 in | Wt 117.0 lb

## 2019-03-26 DIAGNOSIS — K5100 Ulcerative (chronic) pancolitis without complications: Secondary | ICD-10-CM

## 2019-03-26 DIAGNOSIS — R790 Abnormal level of blood mineral: Secondary | ICD-10-CM | POA: Diagnosis not present

## 2019-03-26 DIAGNOSIS — D649 Anemia, unspecified: Secondary | ICD-10-CM | POA: Diagnosis not present

## 2019-03-26 DIAGNOSIS — D539 Nutritional anemia, unspecified: Secondary | ICD-10-CM | POA: Diagnosis not present

## 2019-03-26 MED ORDER — SACCHAROMYCES BOULARDII 250 MG PO CAPS: 250.0000 mg | ORAL_CAPSULE | Freq: Two times a day (BID) | ORAL | 0 refills | Status: DC

## 2019-03-26 NOTE — Progress Notes (Signed)
Subjective:    Patient ID: Rebecca Osborne, female    DOB: Mar 01, 1951, 68 y.o.   MRN: [id number]  HPI Patient is being seen today as a hospital discharge follow-up.  She was admitted to the hospital from October 5 through October 10 with diffuse abdominal pain.  CT scan obtained at that time showed diffuse pancolitis.  Differential diagnosis included C. difficile colitis versus inflammatory bowel disease.  Patient was started on vancomycin for a total of 7 days for possible C. difficile colitis and GI was consulted in the hospital.  All stool studies returned normal.  There was no evidence of any GI pathogen found on the GI pathogen panel.  C. difficile toxin was negative x1.  However the patient had a clinical response to the oral vancomycin.  She has 1 more day of vancomycin remaining.  She denies any diarrhea.  Her abdomen today is soft, nondistended, nontender with normal bowel sounds.  Also in the hospital the patient was found to have hypomagnesemia and was also profoundly anemic with a hemoglobin in the mid 8 range.  She was discharged on magnesium supplementation and vitamin B12.  Patient has not been taking magnesium given to her in the hospital due to concern about magnesium causing diarrhea.  She is on the vitamin B12. Past Medical History:  Diagnosis Date  . Allergy   . Cancer (HCC)    cervical cancer cells  . GERD (gastroesophageal reflux disease)   . Osteopenia    Past Surgical History:  Procedure Laterality Date  . ABDOMINAL HYSTERECTOMY    . BOWEL RESECTION N/A 06/22/2016   Procedure: SMALL BOWEL RESECTION;  Surgeon: Vickie Epley, MD;  Location: [facility];  Service: General;  Laterality: N/A;  . CHOLECYSTECTOMY  2005  . FRACTURE SURGERY     rt foot  . LAPAROTOMY N/A 06/22/2016   Procedure: EXPLORATORY LAPAROTOMY;  Surgeon: Vickie Epley, MD;  Location: [facility];  Service: General;  Laterality: N/A;  . LYSIS OF ADHESION N/A 06/22/2016   Procedure: LYSIS OF ADHESION;  Surgeon:  Vickie Epley, MD;  Location: [facility];  Service: General;  Laterality: N/A;   Current Outpatient Medications on File Prior to Visit  Medication Sig Dispense Refill  . DULoxetine (CYMBALTA) 30 MG capsule Take 1 capsule (30 mg total) by mouth daily. 90 capsule 0  . pantoprazole (PROTONIX) 40 MG tablet Take 1 tablet (40 mg total) by mouth daily. 30 tablet 0  . Vancomycin HCl 250 MG/5ML SOLR Take 2.5 mLs by mouth every 6 (six) hours.    . vitamin B-12 1000 MCG tablet Take 1 tablet (1,000 mcg total) by mouth daily. 30 tablet 0  . Vitamin D, Ergocalciferol, (DRISDOL) 1.25 MG (50000 UT) CAPS capsule Take 1 capsule (50,000 Units total) by mouth every 7 (seven) days. 5 capsule 0  . magnesium gluconate (MAGONATE) 30 MG tablet Take 1 tablet (30 mg total) by mouth 2 (two) times daily for 10 days. (Patient not taking: Reported on 03/26/2019) 20 tablet 0   No current facility-administered medications on file prior to visit.    Allergies  Allergen Reactions  . Codeine Itching   Social History   Socioeconomic History  . Marital status: Widowed    Spouse name: Not on file  . Number of children: Not on file  . Years of education: Not on file  . Highest education level: Not on file  Occupational History  . Not on file  Social Needs  . Emergency planning/management officer  strain: Not on file  . Food insecurity    Worry: Not on file    Inability: Not on file  . Transportation needs    Medical: Not on file    Non-medical: Not on file  Tobacco Use  . Smoking status: Never Smoker  . Smokeless tobacco: Never Used  Substance and Sexual Activity  . Alcohol use: No  . Drug use: No  . Sexual activity: Not Currently  Lifestyle  . Physical activity    Days per week: Not on file    Minutes per session: Not on file  . Stress: Not on file  Relationships  . Social Herbalist on phone: Not on file    Gets together: Not on file    Attends religious service: Not on file    Active member of club or  organization: Not on file    Attends meetings of clubs or organizations: Not on file    Relationship status: Not on file  . Intimate partner violence    Fear of current or ex partner: Not on file    Emotionally abused: Not on file    Physically abused: Not on file    Forced sexual activity: Not on file  Other Topics Concern  . Not on file  Social History Narrative  . Not on file      Review of Systems  All other systems reviewed and are negative.      Objective:   Physical Exam Vitals signs reviewed.  Constitutional:      General: She is not in acute distress.    Appearance: Normal appearance. She is not ill-appearing or toxic-appearing.  Cardiovascular:     Rate and Rhythm: Normal rate and regular rhythm.     Heart sounds: Normal heart sounds. No murmur.  Pulmonary:     Effort: Pulmonary effort is normal. No respiratory distress.     Breath sounds: Normal breath sounds. No stridor. No wheezing, rhonchi or rales.  Abdominal:     General: Abdomen is flat. Bowel sounds are normal. There is no distension.     Palpations: Abdomen is soft. There is no mass.     Tenderness: There is no abdominal tenderness. There is no guarding or rebound.  Musculoskeletal:     Right lower leg: No edema.     Left lower leg: No edema.  Neurological:     Mental Status: She is alert.           Assessment & Plan:  Anemia, unspecified type - Plan: Iron, Vitamin B12, COMPLETE METABOLIC PANEL WITH GFR, CBC with Differential/Platelet  Low magnesium level - Plan: Magnesium  Pancolitis (HCC)  Clinically, the patient's pancolitis has resolved after taking vancomycin.  This would make me suspect that the patient did have C. difficile colitis.  Although her initial test for C. difficile was negative, it is my understanding that it takes 3 separate test to truly rule out C. difficile colitis.  Given the fact I can only find 1 in her records I suspect that she had C. difficile colitis that was just  missed on stool studies.  She has clinically responded.  I believe inflammatory bowel disease or ischemic colitis is less likely.  I will start the patient on a probiotic, Florastor, 250 mg twice daily for 1 month to try to help prevent recurrence of this.  If the patient develops diarrhea again, I will get C. difficile toxin x3.  Also would consider consulting GI for  a colonoscopy to rule out inflammatory bowel disease.  I am also concerned by her anemia.  I will check an iron level and a B12 level.  May also need to check her stool for blood to rule out an occult GI bleed depending on the results.  Reassess her magnesium level however the patient has not been taking her magnesium as prescribed.  I encouraged her to take the magnesium to replete the hypomagnesemia.  However this can potentially cause diarrhea so if the diarrhea returns as soon as she starts magnesium we may need to temporarily hold to determine if the diarrhea is recurrence of C. difficile or if it is due to the magnesium

## 2019-03-27 LAB — COMPLETE METABOLIC PANEL WITH GFR
AG Ratio: 1.5 (calc) (ref 1.0–2.5)
ALT: 12 U/L (ref 6–29)
AST: 14 U/L (ref 10–35)
Albumin: 4.1 g/dL (ref 3.6–5.1)
Alkaline phosphatase (APISO): 84 U/L (ref 37–153)
BUN: 22 mg/dL (ref 7–25)
CO2: 23 mmol/L (ref 20–32)
Calcium: 9.5 mg/dL (ref 8.6–10.4)
Chloride: 102 mmol/L (ref 98–110)
Creat: 0.88 mg/dL (ref 0.50–0.99)
GFR, Est African American: 78 mL/min/{1.73_m2} (ref >=60)
GFR, Est Non African American: 68 mL/min/{1.73_m2} (ref >=60)
Globulin: 2.8 g/dL (calc) (ref 1.9–3.7)
Glucose, Bld: 106 mg/dL — ABNORMAL HIGH (ref 65–99)
Potassium: 4.2 mmol/L (ref 3.5–5.3)
Sodium: 139 mmol/L (ref 135–146)
Total Bilirubin: 0.3 mg/dL (ref 0.2–1.2)
Total Protein: 6.9 g/dL (ref 6.1–8.1)

## 2019-03-27 LAB — MAGNESIUM: Magnesium: 1.8 mg/dL (ref 1.5–2.5)

## 2019-03-27 LAB — VITAMIN B12: Vitamin B-12: 771 pg/mL (ref 200–1100)

## 2019-03-27 LAB — CBC WITH DIFFERENTIAL/PLATELET
Absolute Monocytes: 431 cells/uL (ref 200–950)
Basophils Absolute: 51 cells/uL (ref 0–200)
Basophils Relative: 0.7 %
Eosinophils Absolute: 204 cells/uL (ref 15–500)
Eosinophils Relative: 2.8 %
HCT: 32.7 % — ABNORMAL LOW (ref 35.0–45.0)
Hemoglobin: 10.9 g/dL — ABNORMAL LOW (ref 11.7–15.5)
Lymphs Abs: 1818 cells/uL (ref 850–3900)
MCH: 29.9 pg (ref 27.0–33.0)
MCHC: 33.3 g/dL (ref 32.0–36.0)
MCV: 89.6 fL (ref 80.0–100.0)
MPV: 10.1 fL (ref 7.5–12.5)
Monocytes Relative: 5.9 %
Neutro Abs: 4796 cells/uL (ref 1500–7800)
Neutrophils Relative %: 65.7 %
Platelets: 373 10*3/uL (ref 140–400)
RBC: 3.65 10*6/uL — ABNORMAL LOW (ref 3.80–5.10)
RDW: 12.7 % (ref 11.0–15.0)
Total Lymphocyte: 24.9 %
WBC: 7.3 10*3/uL (ref 3.8–10.8)

## 2019-03-27 LAB — IRON: Iron: 64 ug/dL (ref 45–160)

## 2019-03-31 ENCOUNTER — Other Ambulatory Visit: Payer: Self-pay | Admitting: Podiatry

## 2019-04-08 ENCOUNTER — Telehealth: Payer: Self-pay | Admitting: Gastroenterology

## 2019-04-08 ENCOUNTER — Encounter: Payer: Self-pay | Admitting: Gastroenterology

## 2019-04-08 NOTE — Telephone Encounter (Signed)
Patient needs office visit in 3 to 4 weeks for hospital follow-up.  Can be with Dr. Oneida Alar or APP

## 2019-04-08 NOTE — Telephone Encounter (Signed)
PATIENT SCHEDULED  °

## 2019-04-15 ENCOUNTER — Other Ambulatory Visit: Payer: Self-pay

## 2019-04-15 ENCOUNTER — Inpatient Hospital Stay (HOSPITAL_COMMUNITY)
Admission: EM | Admit: 2019-04-15 | Discharge: 2019-04-20 | DRG: 372 | Disposition: A | Discharge status: Home / Self Care | Payer: Medicare Other | Attending: Internal Medicine | Admitting: Internal Medicine

## 2019-04-15 ENCOUNTER — Encounter (HOSPITAL_COMMUNITY): Payer: Self-pay | Admitting: Emergency Medicine

## 2019-04-15 ENCOUNTER — Emergency Department (HOSPITAL_COMMUNITY): Payer: Medicare Other

## 2019-04-15 DIAGNOSIS — E876 Hypokalemia: Secondary | ICD-10-CM | POA: Diagnosis not present

## 2019-04-15 DIAGNOSIS — Z79899 Other long term (current) drug therapy: Secondary | ICD-10-CM | POA: Diagnosis not present

## 2019-04-15 DIAGNOSIS — Z885 Allergy status to narcotic agent status: Secondary | ICD-10-CM

## 2019-04-15 DIAGNOSIS — K219 Gastro-esophageal reflux disease without esophagitis: Secondary | ICD-10-CM | POA: Diagnosis present

## 2019-04-15 DIAGNOSIS — G90521 Complex regional pain syndrome I of right lower limb: Secondary | ICD-10-CM | POA: Diagnosis present

## 2019-04-15 DIAGNOSIS — Z825 Family history of asthma and other chronic lower respiratory diseases: Secondary | ICD-10-CM | POA: Diagnosis not present

## 2019-04-15 DIAGNOSIS — M8580 Other specified disorders of bone density and structure, unspecified site: Secondary | ICD-10-CM | POA: Diagnosis not present

## 2019-04-15 DIAGNOSIS — K5100 Ulcerative (chronic) pancolitis without complications: Secondary | ICD-10-CM | POA: Diagnosis not present

## 2019-04-15 DIAGNOSIS — Z821 Family history of blindness and visual loss: Secondary | ICD-10-CM

## 2019-04-15 DIAGNOSIS — K529 Noninfective gastroenteritis and colitis, unspecified: Secondary | ICD-10-CM | POA: Diagnosis not present

## 2019-04-15 DIAGNOSIS — A0472 Enterocolitis due to Clostridium difficile, not specified as recurrent: Secondary | ICD-10-CM | POA: Diagnosis not present

## 2019-04-15 DIAGNOSIS — Z8249 Family history of ischemic heart disease and other diseases of the circulatory system: Secondary | ICD-10-CM | POA: Diagnosis not present

## 2019-04-15 DIAGNOSIS — R109 Unspecified abdominal pain: Secondary | ICD-10-CM | POA: Diagnosis not present

## 2019-04-15 DIAGNOSIS — F329 Major depressive disorder, single episode, unspecified: Secondary | ICD-10-CM | POA: Diagnosis present

## 2019-04-15 DIAGNOSIS — N179 Acute kidney failure, unspecified: Secondary | ICD-10-CM | POA: Diagnosis present

## 2019-04-15 DIAGNOSIS — D649 Anemia, unspecified: Secondary | ICD-10-CM | POA: Diagnosis not present

## 2019-04-15 DIAGNOSIS — Z8541 Personal history of malignant neoplasm of cervix uteri: Secondary | ICD-10-CM

## 2019-04-15 DIAGNOSIS — Z20828 Contact with and (suspected) exposure to other viral communicable diseases: Secondary | ICD-10-CM | POA: Diagnosis present

## 2019-04-15 LAB — CBC WITH DIFFERENTIAL/PLATELET
Abs Immature Granulocytes: 0.05 10*3/uL (ref 0.00–0.07)
Basophils Absolute: 0.1 10*3/uL (ref 0.0–0.1)
Basophils Relative: 1 %
Eosinophils Absolute: 0.4 10*3/uL (ref 0.0–0.5)
Eosinophils Relative: 4 %
HCT: 34.7 % — ABNORMAL LOW (ref 36.0–46.0)
Hemoglobin: 10.9 g/dL — ABNORMAL LOW (ref 12.0–15.0)
Immature Granulocytes: 1 %
Lymphocytes Relative: 18 %
Lymphs Abs: 1.7 10*3/uL (ref 0.7–4.0)
MCH: 29.1 pg (ref 26.0–34.0)
MCHC: 31.4 g/dL (ref 30.0–36.0)
MCV: 92.8 fL (ref 80.0–100.0)
Monocytes Absolute: 0.5 10*3/uL (ref 0.1–1.0)
Monocytes Relative: 5 %
Neutro Abs: 6.9 10*3/uL (ref 1.7–7.7)
Neutrophils Relative %: 71 %
Platelets: 328 10*3/uL (ref 150–400)
RBC: 3.74 MIL/uL — ABNORMAL LOW (ref 3.87–5.11)
RDW: 12.8 % (ref 11.5–15.5)
WBC: 9.6 10*3/uL (ref 4.0–10.5)
nRBC: 0 % (ref 0.0–0.2)

## 2019-04-15 LAB — COMPREHENSIVE METABOLIC PANEL
ALT: 14 U/L (ref 0–44)
AST: 18 U/L (ref 15–41)
Albumin: 3.5 g/dL (ref 3.5–5.0)
Alkaline Phosphatase: 78 U/L (ref 38–126)
Anion gap: 8 (ref 5–15)
BUN: 29 mg/dL — ABNORMAL HIGH (ref 8–23)
CO2: 24 mmol/L (ref 22–32)
Calcium: 8.4 mg/dL — ABNORMAL LOW (ref 8.9–10.3)
Chloride: 104 mmol/L (ref 98–111)
Creatinine, Ser: 1.2 mg/dL — ABNORMAL HIGH (ref 0.44–1.00)
GFR calc Af Amer: 54 mL/min — ABNORMAL LOW (ref >60)
GFR calc non Af Amer: 46 mL/min — ABNORMAL LOW (ref >60)
Glucose, Bld: 125 mg/dL — ABNORMAL HIGH (ref 70–99)
Potassium: 2.9 mmol/L — ABNORMAL LOW (ref 3.5–5.1)
Sodium: 136 mmol/L (ref 135–145)
Total Bilirubin: 0.2 mg/dL — ABNORMAL LOW (ref 0.3–1.2)
Total Protein: 7.2 g/dL (ref 6.5–8.1)

## 2019-04-15 LAB — LIPASE, BLOOD: Lipase: 32 U/L (ref 11–51)

## 2019-04-15 MED ORDER — POTASSIUM CHLORIDE CRYS ER 20 MEQ PO TBCR
40.0000 meq | EXTENDED_RELEASE_TABLET | Freq: Once | ORAL | Status: AC
  Administered 2019-04-15: 23:00:00 40 meq via ORAL
  Filled 2019-04-15: qty 2

## 2019-04-15 MED ORDER — IOHEXOL 300 MG/ML  SOLN
100.0000 mL | Freq: Once | INTRAMUSCULAR | Status: AC | PRN
  Administered 2019-04-15: 23:00:00 100 mL via INTRAVENOUS

## 2019-04-15 MED ORDER — SODIUM CHLORIDE 0.9 % IV BOLUS
1000.0000 mL | Freq: Once | INTRAVENOUS | Status: AC
  Administered 2019-04-15: 23:00:00 1000 mL via INTRAVENOUS

## 2019-04-15 MED ORDER — IOHEXOL 9 MG/ML PO SOLN: 500.0000 mL | ORAL | Status: AC

## 2019-04-15 NOTE — ED Notes (Signed)
Pt returned to room from ct

## 2019-04-15 NOTE — ED Provider Notes (Signed)
[facility] EMERGENCY DEPARTMENT Provider Note   CSN: [id number] Arrival date & time: 04/15/19  1720     History   Chief Complaint Chief Complaint  Patient presents with  . Abdominal Pain  . Diarrhea    HPI Rebecca Osborne is a 68 y.o. female.     The history is provided by the patient. No language interpreter was used.  Abdominal Pain Pain location:  Generalized Pain quality: aching   Pain radiates to:  Does not radiate Pain severity:  Moderate Onset quality:  Gradual Duration:  5 weeks Timing:  Constant Progression:  Worsening Chronicity:  Recurrent Relieved by:  Nothing Worsened by:  Nothing Ineffective treatments:  None tried Associated symptoms: diarrhea   Diarrhea Associated symptoms: abdominal pain   Pt complains of abdominal pain.  Pt reports she was diagnosed with colitis a month ago.  Pt has a bottle of oral vancomycin.  Pt reports her MD treated her with vancomycin.  Pt reports increasing weakness, increased abdominal bloating and discomfort.  Pt is scheduled to see Gi in 2 weeks. Pt reports multiple mucus bowel movements a day.  Pt reports approximately 5 a day.    Past Medical History:  Diagnosis Date  . Allergy   . Cancer (HCC)    cervical cancer cells  . GERD (gastroesophageal reflux disease)   . Osteopenia     Patient Active Problem List   Diagnosis Date Noted  . Diarrhea of presumed infectious origin   . Pancolitis ([place]) 03/11/2019  . Normocytic anemia 03/11/2019  . Complex regional pain syndrome type 1 of right lower extremity 10/22/2018  . Small bowel obstruction due to adhesions ([place]) 06/22/2016  . GERD 08/31/2009  . DYSPHAGIA UNSPECIFIED 08/31/2009    Past Surgical History:  Procedure Laterality Date  . ABDOMINAL HYSTERECTOMY    . BOWEL RESECTION N/A 06/22/2016   Procedure: SMALL BOWEL RESECTION;  Surgeon: Vickie Epley, MD;  Location: [facility];  Service: General;  Laterality: N/A;  . CHOLECYSTECTOMY  2005  . FRACTURE SURGERY     rt foot  . LAPAROTOMY N/A 06/22/2016   Procedure: EXPLORATORY LAPAROTOMY;  Surgeon: Vickie Epley, MD;  Location: [facility];  Service: General;  Laterality: N/A;  . LYSIS OF ADHESION N/A 06/22/2016   Procedure: LYSIS OF ADHESION;  Surgeon: Vickie Epley, MD;  Location: [facility];  Service: General;  Laterality: N/A;     OB History    Gravida      Para      Term      Preterm      AB      Living  3     SAB      TAB      Ectopic      Multiple      Live Births               Home Medications    Prior to Admission medications   Medication Sig Start Date End Date Taking? Authorizing Provider  pantoprazole (PROTONIX) 40 MG tablet Take 1 tablet (40 mg total) by mouth daily. 03/17/19 04/16/19 Yes Shah, Pratik D, DO  vitamin B-12 1000 MCG tablet Take 1 tablet (1,000 mcg total) by mouth daily. 03/17/19 04/16/19 Yes Shah, Pratik D, DO  DULoxetine (CYMBALTA) 30 MG capsule TAKE 1 CAPSULE BY MOUTH EVERY DAY Patient taking differently: Take 30 mg by mouth daily.  04/02/19   Trula Slade, DPM  saccharomyces boulardii (FLORASTOR) 250 MG capsule Take 1 capsule (  250 mg total) by mouth 2 (two) times daily. 03/26/19   Susy Frizzle, MD  Vancomycin HCl 250 MG/5ML SOLR Take 2.5 mLs by mouth every 6 (six) hours.    [provider]  Vitamin D, Ergocalciferol, (DRISDOL) 1.25 MG (50000 UT) CAPS capsule Take 1 capsule (50,000 Units total) by mouth every 7 (seven) days. Patient not taking: Reported on 04/15/2019 01/29/19   Trula Slade, DPM    Family History Family History  Problem Relation Age of Onset  . COPD Mother   . Heart disease Mother   . Miscarriages / [place] Mother   . Vision loss Mother        from shingles  . COPD Father   . Cancer Brother        leukemia  . Early death Brother   . Colon cancer Neg Hx   . Colon polyps Neg Hx   . Inflammatory bowel disease Neg Hx     Social History Social History   Tobacco Use  . Smoking status: Never  Smoker  . Smokeless tobacco: Never Used  Substance Use Topics  . Alcohol use: No  . Drug use: No     Allergies   Codeine   Review of Systems Review of Systems  Gastrointestinal: Positive for abdominal pain and diarrhea.  All other systems reviewed and are negative.    Physical Exam Updated Vital Signs BP 123/73 (BP Location: Right Arm)   Pulse 94   Temp 98.2 F (36.8 C) (Oral)   Resp 12   Ht 5' 1"  (1.549 m)   Wt 55.3 kg   SpO2 100%   BMI 23.05 kg/m   Physical Exam Vitals signs and nursing note reviewed.  Constitutional:      Appearance: She is well-developed.  HENT:     Head: Normocephalic.  Neck:     Musculoskeletal: Normal range of motion.  Cardiovascular:     Rate and Rhythm: Normal rate.     Heart sounds: Normal heart sounds.  Pulmonary:     Effort: Pulmonary effort is normal.  Abdominal:     General: There is distension.  Musculoskeletal: Normal range of motion.  Skin:    General: Skin is warm.  Neurological:     General: No focal deficit present.     Mental Status: She is alert and oriented to person, place, and time.      ED Treatments / Results  Labs (all labs ordered are listed, but only abnormal results are displayed) Labs Reviewed  COMPREHENSIVE METABOLIC PANEL - Abnormal; Notable for the following components:      Result Value   Potassium 2.9 (*)    Glucose, Bld 125 (*)    BUN 29 (*)    Creatinine, Ser 1.20 (*)    Calcium 8.4 (*)    Total Bilirubin 0.2 (*)    GFR calc non Af Amer 46 (*)    GFR calc Af Amer 54 (*)    All other components within normal limits  CBC WITH DIFFERENTIAL/PLATELET - Abnormal; Notable for the following components:   RBC 3.74 (*)    Hemoglobin 10.9 (*)    HCT 34.7 (*)    All other components within normal limits  LIPASE, BLOOD  URINALYSIS, ROUTINE W REFLEX MICROSCOPIC    EKG None  Radiology Ct Abdomen Pelvis W Contrast  Result Date: 04/15/2019 CLINICAL DATA:  Abdominal pain, recent history of  colitis EXAM: CT ABDOMEN AND PELVIS WITH CONTRAST TECHNIQUE: Multidetector CT imaging of the  abdomen and pelvis was performed using the standard protocol following bolus administration of intravenous contrast. CONTRAST:  142m OMNIPAQUE IOHEXOL 300 MG/ML  SOLN COMPARISON:  March 11, 2019 FINDINGS: Lower chest: The visualized heart size within normal limits. No pericardial fluid/thickening. A small hiatal hernia is present. The visualized portions of the lungs are clear. Hepatobiliary: The liver is normal in density without focal abnormality.The main portal vein is patent. The patient is status post cholecystectomy. Mild dilatation of the extrahepatic biliary duct. Pancreas: Unremarkable. No pancreatic ductal dilatation or surrounding inflammatory changes. Spleen: Normal in size without focal abnormality. Adrenals/Urinary Tract: Both adrenal glands appear normal. The kidneys and collecting system appear normal without evidence of urinary tract calculus or hydronephrosis. Bladder is unremarkable. Stomach/Bowel: The stomach and small bowel are unremarkable. There is diffuse wall thickening seen within the cecum, sigmoid colon and rectum. There is mild fat stranding changes seen around the sigmoid rectal junction. No pericolonic free fluid or free air is seen. No focal area of stenosis is seen. The appendix is unremarkable. Vascular/Lymphatic: There are no enlarged mesenteric, retroperitoneal, or pelvic lymph nodes. Scattered aortic atherosclerotic calcifications are seen without aneurysmal dilatation. Reproductive: The patient is status post hysterectomy. No adnexal masses or collections seen. Other: No evidence of abdominal wall mass or hernia. Musculoskeletal: No acute or significant osseous findings. IMPRESSION: 1. Wall thickening with surrounding inflammatory changes involving the cecum, sigmoid colon, and rectum, consistent with colitis. This could be due to infectious or inflammatory process. Not thought to be  due to ischemic colitis. Given patient the recent episode of colitis on March 11, 2019 would recommend direct visualization upon resolution of symptoms for underlying mass lesion. 2.  Aortic Atherosclerosis (ICD10-I70.0). Electronically Signed   By: BPrudencio PairM.D.   On: 04/15/2019 23:10    Procedures Procedures (including critical care time)  Medications Ordered in ED Medications  iohexol (OMNIPAQUE) 9 MG/ML oral solution 500 mL (has no administration in time range)  sodium chloride 0.9 % bolus 1,000 mL (1,000 mLs Intravenous New Bag/Given 04/15/19 2315)  potassium chloride SA (KLOR-CON) CR tablet 40 mEq (40 mEq Oral Given 04/15/19 2316)  iohexol (OMNIPAQUE) 300 MG/ML solution 100 mL (100 mLs Intravenous Contrast Given 04/15/19 2250)     Initial Impression / Assessment and Plan / ED Course  I have reviewed the triage vital signs and the nursing notes.  Pertinent labs & imaging results that were available during my care of the patient were reviewed by me and considered in my medical decision making (see chart for details).        MDM  Labs reviewed.  Pt has increased renal functions, decreased potasium to 2.9.  Ct shows diffuse colitis.   Pt given potassium 40 mg.  Iv fluids x 1 liter  I spoke to Hospitalist who will admit Final Clinical Impressions(s) / ED Diagnoses   Final diagnoses:  Colitis  Hypokalemia  Acute kidney injury (nontraumatic) ([facility]    ED Discharge Orders    None       SSidney Ace11/10/20 0030    WRipley Fraise MD 04/16/19 0221

## 2019-04-15 NOTE — ED Triage Notes (Signed)
Pt states that she is having abd pain with diarrhea that has gone to mucus she was admitted for this on 03/11/2019.

## 2019-04-15 NOTE — ED Notes (Signed)
Failed attempt for ivf x2. Second rn to room to attempt

## 2019-04-15 NOTE — ED Notes (Signed)
Pt went to restroom without providing sample

## 2019-04-16 ENCOUNTER — Encounter (HOSPITAL_COMMUNITY): Payer: Self-pay | Admitting: Internal Medicine

## 2019-04-16 DIAGNOSIS — F329 Major depressive disorder, single episode, unspecified: Secondary | ICD-10-CM | POA: Diagnosis present

## 2019-04-16 DIAGNOSIS — D649 Anemia, unspecified: Secondary | ICD-10-CM | POA: Diagnosis present

## 2019-04-16 DIAGNOSIS — A0472 Enterocolitis due to Clostridium difficile, not specified as recurrent: Secondary | ICD-10-CM | POA: Diagnosis not present

## 2019-04-16 DIAGNOSIS — Z20828 Contact with and (suspected) exposure to other viral communicable diseases: Secondary | ICD-10-CM | POA: Diagnosis present

## 2019-04-16 DIAGNOSIS — Z821 Family history of blindness and visual loss: Secondary | ICD-10-CM | POA: Diagnosis not present

## 2019-04-16 DIAGNOSIS — E876 Hypokalemia: Secondary | ICD-10-CM | POA: Diagnosis present

## 2019-04-16 DIAGNOSIS — Z79899 Other long term (current) drug therapy: Secondary | ICD-10-CM | POA: Diagnosis not present

## 2019-04-16 DIAGNOSIS — K529 Noninfective gastroenteritis and colitis, unspecified: Secondary | ICD-10-CM | POA: Diagnosis not present

## 2019-04-16 DIAGNOSIS — Z8541 Personal history of malignant neoplasm of cervix uteri: Secondary | ICD-10-CM | POA: Diagnosis not present

## 2019-04-16 DIAGNOSIS — K5100 Ulcerative (chronic) pancolitis without complications: Secondary | ICD-10-CM | POA: Diagnosis not present

## 2019-04-16 DIAGNOSIS — Z885 Allergy status to narcotic agent status: Secondary | ICD-10-CM | POA: Diagnosis not present

## 2019-04-16 DIAGNOSIS — K219 Gastro-esophageal reflux disease without esophagitis: Secondary | ICD-10-CM | POA: Diagnosis not present

## 2019-04-16 DIAGNOSIS — N179 Acute kidney failure, unspecified: Secondary | ICD-10-CM | POA: Diagnosis not present

## 2019-04-16 DIAGNOSIS — Z825 Family history of asthma and other chronic lower respiratory diseases: Secondary | ICD-10-CM | POA: Diagnosis not present

## 2019-04-16 DIAGNOSIS — M8580 Other specified disorders of bone density and structure, unspecified site: Secondary | ICD-10-CM | POA: Diagnosis present

## 2019-04-16 DIAGNOSIS — Z8249 Family history of ischemic heart disease and other diseases of the circulatory system: Secondary | ICD-10-CM | POA: Diagnosis not present

## 2019-04-16 DIAGNOSIS — G90521 Complex regional pain syndrome I of right lower limb: Secondary | ICD-10-CM | POA: Diagnosis present

## 2019-04-16 LAB — COMPREHENSIVE METABOLIC PANEL
ALT: 12 U/L (ref 0–44)
AST: 13 U/L — ABNORMAL LOW (ref 15–41)
Albumin: 2.8 g/dL — ABNORMAL LOW (ref 3.5–5.0)
Alkaline Phosphatase: 63 U/L (ref 38–126)
Anion gap: 6 (ref 5–15)
BUN: 22 mg/dL (ref 8–23)
CO2: 23 mmol/L (ref 22–32)
Calcium: 8.2 mg/dL — ABNORMAL LOW (ref 8.9–10.3)
Chloride: 110 mmol/L (ref 98–111)
Creatinine, Ser: 0.95 mg/dL (ref 0.44–1.00)
GFR calc Af Amer: >60 mL/min (ref >60)
GFR calc non Af Amer: >60 mL/min (ref >60)
Glucose, Bld: 97 mg/dL (ref 70–99)
Potassium: 4.3 mmol/L (ref 3.5–5.1)
Sodium: 139 mmol/L (ref 135–145)
Total Bilirubin: 0.5 mg/dL (ref 0.3–1.2)
Total Protein: 5.7 g/dL — ABNORMAL LOW (ref 6.5–8.1)

## 2019-04-16 LAB — CBC WITH DIFFERENTIAL/PLATELET
Abs Immature Granulocytes: 0.02 10*3/uL (ref 0.00–0.07)
Basophils Absolute: 0.1 10*3/uL (ref 0.0–0.1)
Basophils Relative: 1 %
Eosinophils Absolute: 0.4 10*3/uL (ref 0.0–0.5)
Eosinophils Relative: 6 %
HCT: 32 % — ABNORMAL LOW (ref 36.0–46.0)
Hemoglobin: 10.1 g/dL — ABNORMAL LOW (ref 12.0–15.0)
Immature Granulocytes: 0 %
Lymphocytes Relative: 25 %
Lymphs Abs: 1.8 10*3/uL (ref 0.7–4.0)
MCH: 29.2 pg (ref 26.0–34.0)
MCHC: 31.6 g/dL (ref 30.0–36.0)
MCV: 92.5 fL (ref 80.0–100.0)
Monocytes Absolute: 0.6 10*3/uL (ref 0.1–1.0)
Monocytes Relative: 8 %
Neutro Abs: 4.3 10*3/uL (ref 1.7–7.7)
Neutrophils Relative %: 60 %
Platelets: 257 10*3/uL (ref 150–400)
RBC: 3.46 MIL/uL — ABNORMAL LOW (ref 3.87–5.11)
RDW: 12.7 % (ref 11.5–15.5)
WBC: 7.1 10*3/uL (ref 4.0–10.5)
nRBC: 0 % (ref 0.0–0.2)

## 2019-04-16 LAB — URINALYSIS, ROUTINE W REFLEX MICROSCOPIC
Bacteria, UA: NONE SEEN
Bilirubin Urine: NEGATIVE
Glucose, UA: NEGATIVE mg/dL
Hgb urine dipstick: NEGATIVE
Ketones, ur: NEGATIVE mg/dL
Nitrite: NEGATIVE
Protein, ur: NEGATIVE mg/dL
Specific Gravity, Urine: 1.014 (ref 1.005–1.030)
pH: 6 (ref 5.0–8.0)

## 2019-04-16 LAB — GASTROINTESTINAL PANEL BY PCR, STOOL (REPLACES STOOL CULTURE)

## 2019-04-16 LAB — SARS CORONAVIRUS 2 (TAT 6-24 HRS): SARS Coronavirus 2: NEGATIVE

## 2019-04-16 LAB — C DIFFICILE QUICK SCREEN W PCR REFLEX
C Diff antigen: POSITIVE — AB
C Diff interpretation: DETECTED
C Diff toxin: POSITIVE — AB

## 2019-04-16 LAB — MAGNESIUM: Magnesium: 1.9 mg/dL (ref 1.7–2.4)

## 2019-04-16 LAB — PHOSPHORUS: Phosphorus: 4.6 mg/dL (ref 2.5–4.6)

## 2019-04-16 MED ORDER — VANCOMYCIN 50 MG/ML ORAL SOLUTION: 125.0000 mg | Freq: Every day | ORAL | Status: DC

## 2019-04-16 MED ORDER — VANCOMYCIN 50 MG/ML ORAL SOLUTION: 125.0000 mg | ORAL | Status: DC

## 2019-04-16 MED ORDER — PANTOPRAZOLE SODIUM 40 MG PO TBEC
40.0000 mg | DELAYED_RELEASE_TABLET | Freq: Every day | ORAL | Status: DC
  Administered 2019-04-16 – 2019-04-18 (×3): 40 mg via ORAL
  Filled 2019-04-16 (×3): qty 1

## 2019-04-16 MED ORDER — SACCHAROMYCES BOULARDII 250 MG PO CAPS
250.0000 mg | ORAL_CAPSULE | Freq: Two times a day (BID) | ORAL | Status: DC
  Administered 2019-04-16 – 2019-04-20 (×9): 250 mg via ORAL
  Filled 2019-04-16 (×11): qty 1

## 2019-04-16 MED ORDER — VITAMIN B-12 1000 MCG PO TABS
1000.0000 ug | ORAL_TABLET | Freq: Every day | ORAL | Status: DC
  Administered 2019-04-16 – 2019-04-20 (×5): 1000 ug via ORAL
  Filled 2019-04-16 (×6): qty 1

## 2019-04-16 MED ORDER — LACTATED RINGERS IV SOLN
INTRAVENOUS | Status: DC
  Administered 2019-04-16 – 2019-04-20 (×7): via INTRAVENOUS

## 2019-04-16 MED ORDER — OXYCODONE HCL 5 MG PO TABS: 5.0000 mg | ORAL_TABLET | Freq: Once | ORAL | Status: DC

## 2019-04-16 MED ORDER — VANCOMYCIN 50 MG/ML ORAL SOLUTION: 125.0000 mg | Freq: Two times a day (BID) | ORAL | Status: DC

## 2019-04-16 MED ORDER — VANCOMYCIN 50 MG/ML ORAL SOLUTION
125.0000 mg | Freq: Four times a day (QID) | ORAL | Status: DC
  Administered 2019-04-16 – 2019-04-18 (×8): 125 mg via ORAL
  Filled 2019-04-16 (×14): qty 2.5

## 2019-04-16 MED ORDER — PROCHLORPERAZINE EDISYLATE 10 MG/2ML IJ SOLN: 5.0000 mg | INTRAMUSCULAR | Status: DC | PRN

## 2019-04-16 MED ORDER — POTASSIUM CHLORIDE IN NACL 40-0.9 MEQ/L-% IV SOLN
INTRAVENOUS | Status: DC
  Administered 2019-04-16: 05:00:00 125 mL/h via INTRAVENOUS
  Filled 2019-04-16 (×6): qty 1000

## 2019-04-16 MED ORDER — ACETAMINOPHEN 650 MG RE SUPP: 650.0000 mg | Freq: Four times a day (QID) | RECTAL | Status: DC | PRN

## 2019-04-16 MED ORDER — DULOXETINE HCL 30 MG PO CPEP
30.0000 mg | ORAL_CAPSULE | Freq: Every day | ORAL | Status: DC
  Administered 2019-04-16 – 2019-04-20 (×5): 30 mg via ORAL
  Filled 2019-04-16 (×5): qty 1

## 2019-04-16 MED ORDER — ACETAMINOPHEN 325 MG PO TABS
650.0000 mg | ORAL_TABLET | Freq: Four times a day (QID) | ORAL | Status: DC | PRN
  Administered 2019-04-16 – 2019-04-18 (×4): 650 mg via ORAL
  Filled 2019-04-16 (×4): qty 2

## 2019-04-16 NOTE — H&P (Signed)
History and Physical    Rebecca Osborne EAV:[id number] DOB: [birth year] DOA: 04/15/2019  PCP: Susy Frizzle, MD   Patient coming from: Home.  I have personally briefly reviewed patient's old medical records in [place]  Chief Complaint: Abdominal pain and diarrhea.  HPI: Rebecca Osborne is a 68 y.o. female with medical history significant of seasonal allergies, unspecified great cervical cancer, GERD, osteopenia who is coming to the emergency department complaints of diarrhea and abdominal pain for the past 5 to 6 weeks.  She was admitted 5 weeks ago for rehydration and electrolyte replacement.  GI pathogen panel and C. difficile testing was negative.  She was recently started on oral vancomycin as an outpatient.  She denies nausea, vomiting, constipation, melena or hematochezia.  She states that her bowel movements now show mostly mucus content.  She denies fever, but complains of chills, fatigue and decreased appetite.  No sore throat, rhinorrhea, dyspnea, wheezing or hemoptysis.  No chest pain, palpitations, diaphoresis, PND, orthopnea or pitting edema of the lower extremities.  Denies dysuria, frequency or hematuria.  No polyuria, no polydipsia, no polyphagia or blurred vision.  ED Course: Her initial vital signs were temperature 98.2 F, pulse 94, respiration 12, blood pressure 123/73 mmHg O2 sat 100% on room air.  Urinalysis shows small leukocyte esterase, but is otherwise unremarkable.  White count is 9.6, hemoglobin 10.9 g/dL and platelets 328.  Potassium was 2.9 mmol/L.  Glucose 125, BUN 29, creatinine 1.20 and calcium 8.4 mg/dL.  The rest of the CMP results are within expected values.  Magnesium and phosphorus were normal.  Lipase was unremarkable.  Imaging: CT abdomen shows wall thickening with surrounding phonatory changes involving the cecum, sigmoid colon and rectum consistent with colitis.  This could be due to infectious or inflammatory process.  Does not seem to be due to  ischemic colitis.  Please see images and full radiology report for further details.  Review of Systems: As per HPI otherwise 10 point review of systems negative.   Past Medical History:  Diagnosis Date  . Allergy   . Cancer (HCC)    cervical cancer cells  . GERD (gastroesophageal reflux disease)   . Osteopenia     Past Surgical History:  Procedure Laterality Date  . ABDOMINAL HYSTERECTOMY    . BOWEL RESECTION N/A 06/22/2016   Procedure: SMALL BOWEL RESECTION;  Surgeon: Vickie Epley, MD;  Location: [facility];  Service: General;  Laterality: N/A;  . CHOLECYSTECTOMY  2005  . FRACTURE SURGERY     rt foot  . LAPAROTOMY N/A 06/22/2016   Procedure: EXPLORATORY LAPAROTOMY;  Surgeon: Vickie Epley, MD;  Location: [facility];  Service: General;  Laterality: N/A;  . LYSIS OF ADHESION N/A 06/22/2016   Procedure: LYSIS OF ADHESION;  Surgeon: Vickie Epley, MD;  Location: [facility];  Service: General;  Laterality: N/A;     reports that she has never smoked. She has never used smokeless tobacco. She reports that she does not drink alcohol or use drugs.  Allergies  Allergen Reactions  . Codeine Itching    Family History  Problem Relation Age of Onset  . COPD Mother   . Heart disease Mother   . Miscarriages / [place] Mother   . Vision loss Mother        from shingles  . COPD Father   . Cancer Brother        leukemia  . Early death Brother   . Colon cancer Neg  Hx   . Colon polyps Neg Hx   . Inflammatory bowel disease Neg Hx    Prior to Admission medications   Medication Sig Start Date End Date Taking? Authorizing Provider  pantoprazole (PROTONIX) 40 MG tablet Take 1 tablet (40 mg total) by mouth daily. 03/17/19 04/16/19 Yes Shah, Pratik D, DO  vitamin B-12 1000 MCG tablet Take 1 tablet (1,000 mcg total) by mouth daily. 03/17/19 04/16/19 Yes Shah, Pratik D, DO  DULoxetine (CYMBALTA) 30 MG capsule TAKE 1 CAPSULE BY MOUTH EVERY DAY Patient taking differently: Take 30 mg by mouth  daily.  04/02/19   Trula Slade, DPM  saccharomyces boulardii (FLORASTOR) 250 MG capsule Take 1 capsule (250 mg total) by mouth 2 (two) times daily. 03/26/19   Susy Frizzle, MD  Vancomycin HCl 250 MG/5ML SOLR Take 2.5 mLs by mouth every 6 (six) hours.    [provider]  Vitamin D, Ergocalciferol, (DRISDOL) 1.25 MG (50000 UT) CAPS capsule Take 1 capsule (50,000 Units total) by mouth every 7 (seven) days. Patient not taking: Reported on 04/15/2019 01/29/19   Trula Slade, DPM    Physical Exam: Vitals:   04/15/19 1747  BP: 123/73  Pulse: 94  Resp: 12  Temp: 98.2 F (36.8 C)  TempSrc: Oral  SpO2: 100%  Weight: 55.3 kg  Height: 5' 1"  (1.549 m)    Constitutional: NAD, calm, comfortable Eyes: PERRL, lids and conjunctivae normal ENMT: Mucous membranes are moist. Posterior pharynx clear of any exudate or lesions. Neck: normal, supple, no masses, no thyromegaly Respiratory: Clear to auscultation bilaterally, no wheezing, no crackles. Normal respiratory effort. No accessory muscle use.  Cardiovascular: Regular rate and rhythm, no murmurs / rubs / gallops. No extremity edema. 2+ pedal pulses. No carotid bruits.  Abdomen: Soft, mild LLQ tenderness, no masses palpated. No hepatosplenomegaly. Bowel sounds positive.  Musculoskeletal: no clubbing / cyanosis. Good ROM, no contractures. Normal muscle tone.  Skin: no rashes, lesions, ulcers on limited dermatological examination. Neurologic: CN 2-12 grossly intact. Sensation intact, DTR normal. Strength 5/5 in all 4.  Psychiatric: Normal judgment and insight. Alert and oriented x 3. Normal mood.   Labs on Admission: I have personally reviewed following labs and imaging studies  CBC: Recent Labs  Lab 04/15/19 1911  WBC 9.6  NEUTROABS 6.9  HGB 10.9*  HCT 34.7*  MCV 92.8  PLT 937   Basic Metabolic Panel: Recent Labs  Lab 04/15/19 1911  NA 136  K 2.9*  CL 104  CO2 24  GLUCOSE 125*  BUN 29*  CREATININE 1.20*   CALCIUM 8.4*  MG 1.9  PHOS 4.6   GFR: Estimated Creatinine Clearance: 33.9 mL/min (A) (by C-G formula based on SCr of 1.2 mg/dL (H)). Liver Function Tests: Recent Labs  Lab 04/15/19 1911  AST 18  ALT 14  ALKPHOS 78  BILITOT 0.2*  PROT 7.2  ALBUMIN 3.5   Recent Labs  Lab 04/15/19 1911  LIPASE 32   No results for input(s): AMMONIA in the last 168 hours. Coagulation Profile: No results for input(s): INR, PROTIME in the last 168 hours. Cardiac Enzymes: No results for input(s): CKTOTAL, CKMB, CKMBINDEX, TROPONINI in the last 168 hours. BNP (last 3 results) No results for input(s): PROBNP in the last 8760 hours. HbA1C: No results for input(s): HGBA1C in the last 72 hours. CBG: No results for input(s): GLUCAP in the last 168 hours. Lipid Profile: No results for input(s): CHOL, HDL, LDLCALC, TRIG, CHOLHDL, LDLDIRECT in the last 72 hours. Thyroid  Function Tests: No results for input(s): TSH, T4TOTAL, FREET4, T3FREE, THYROIDAB in the last 72 hours. Anemia Panel: No results for input(s): VITAMINB12, FOLATE, FERRITIN, TIBC, IRON, RETICCTPCT in the last 72 hours. Urine analysis:    Component Value Date/Time   COLORURINE STRAW (A) 03/12/2019 0329   APPEARANCEUR CLEAR 03/12/2019 0329   LABSPEC 1.033 (H) 03/12/2019 0329   PHURINE 5.0 03/12/2019 0329   GLUCOSEU NEGATIVE 03/12/2019 0329   HGBUR NEGATIVE 03/12/2019 0329   BILIRUBINUR NEGATIVE 03/12/2019 0329   KETONESUR 5 (A) 03/12/2019 0329   PROTEINUR NEGATIVE 03/12/2019 0329   UROBILINOGEN 0.2 02/21/2019 1940   NITRITE NEGATIVE 03/12/2019 0329   LEUKOCYTESUR NEGATIVE 03/12/2019 0329    Radiological Exams on Admission: Ct Abdomen Pelvis W Contrast  Result Date: 04/15/2019 CLINICAL DATA:  Abdominal pain, recent history of colitis EXAM: CT ABDOMEN AND PELVIS WITH CONTRAST TECHNIQUE: Multidetector CT imaging of the abdomen and pelvis was performed using the standard protocol following bolus administration of intravenous  contrast. CONTRAST:  124m OMNIPAQUE IOHEXOL 300 MG/ML  SOLN COMPARISON:  March 11, 2019 FINDINGS: Lower chest: The visualized heart size within normal limits. No pericardial fluid/thickening. A small hiatal hernia is present. The visualized portions of the lungs are clear. Hepatobiliary: The liver is normal in density without focal abnormality.The main portal vein is patent. The patient is status post cholecystectomy. Mild dilatation of the extrahepatic biliary duct. Pancreas: Unremarkable. No pancreatic ductal dilatation or surrounding inflammatory changes. Spleen: Normal in size without focal abnormality. Adrenals/Urinary Tract: Both adrenal glands appear normal. The kidneys and collecting system appear normal without evidence of urinary tract calculus or hydronephrosis. Bladder is unremarkable. Stomach/Bowel: The stomach and small bowel are unremarkable. There is diffuse wall thickening seen within the cecum, sigmoid colon and rectum. There is mild fat stranding changes seen around the sigmoid rectal junction. No pericolonic free fluid or free air is seen. No focal area of stenosis is seen. The appendix is unremarkable. Vascular/Lymphatic: There are no enlarged mesenteric, retroperitoneal, or pelvic lymph nodes. Scattered aortic atherosclerotic calcifications are seen without aneurysmal dilatation. Reproductive: The patient is status post hysterectomy. No adnexal masses or collections seen. Other: No evidence of abdominal wall mass or hernia. Musculoskeletal: No acute or significant osseous findings. IMPRESSION: 1. Wall thickening with surrounding inflammatory changes involving the cecum, sigmoid colon, and rectum, consistent with colitis. This could be due to infectious or inflammatory process. Not thought to be due to ischemic colitis. Given patient the recent episode of colitis on March 11, 2019 would recommend direct visualization upon resolution of symptoms for underlying mass lesion. 2.  Aortic  Atherosclerosis (ICD10-I70.0). Electronically Signed   By: BPrudencio PairM.D.   On: 04/15/2019 23:10    EKG: Independently reviewed.   Assessment/Plan Principal Problem: Pancolitis (HCC) Observation/telemetry. Continue IV fluids. Clear liquid diets. Antiemetics as needed. Analgesics as needed. Continue PPI. Follow-up C. difficile colitis test. Scheduled to see GI later this week.  Active Problems:   Hypokalemia Replacing. Follow up potassium level.    GERD Protonix 40 mg p.o. daily.    Normocytic anemia Anemia panel performed last month was normal. Monitor H&H.   DVT prophylaxis: SCDs. Code Status: Full code. Family Communication: Disposition Plan: Observation for IV fluids and electrolyte replacement. Consults called: Routine GI consult. Admission status: Observation/telemetry.   DReubin MilanMD [facility]  If 7PM-7AM, please contact night-coverage [email or web address]  04/16/2019, 12:52 AM   This document was prepared using Dragon voice recognition software and may contain some unintended transcription errors.

## 2019-04-16 NOTE — Consult Note (Signed)
Referring Provider: [facility] Primary Care Physician:  Susy Frizzle, MD Primary Gastroenterologist:  Dr. Oneida Alar  Date of Admission: 04/15/19 Date of Consultation: 04/16/19  Reason for Consultation:  C. Diff colitis  HPI:  Rebecca Osborne is a 68 y.o. year old female medical history significant for seasonal allergies, unspecified cervical cancer s/p total abdominal hysterectomy, GERD, H. Pylori in 2011, closed loop small bowel obstruction attributable to post-hysterectomy adhesions with resection of 38 cm of distal jejunum in 2018, and osteopenia who presented to the emergency department on 04/15/19 for diarrhea and abdominal pain. She was recently admitted from 03/11/2019-03/16/2019 for pancolitis presumed infectious and treated for C. difficile colitis with vancomycin 125m QID daily x10 days.  Her C. difficile testing on 03/11/2019 was negative; however, patient had risk factors for C. difficile including recent antibiotics.  She had slow improvement throughout her hospital course with this empiric vancomycin.  She was discharged with 7 days of oral vancomycin to complete a 10-day course.   ED course: Vital signs were stable.  WBC 9.6, hemoglobin 10.9 (stable), potassium 2.9, glucose 125, BUN 29, creatinine 1.2, calcium 8.4.  Magnesium and phosphorus normal.  Lipase normal.  CT abdomen and pelvis with wall thickening with surrounding inflammatory changes involving the cecum, sigmoid colon, and rectum, consistent with colitis.  Infectious versus inflammatory process.  Less likely ischemic colitis.  Stool studies completed with C. difficile antigen and C. difficile toxin positive.  GI pathogen panel pending.  Patient saw PCP in follow-up on 03/26/2019 and denied any diarrhea or abdominal pain.  She was advised to start a daily probiotic.   Today she states she completed her course of antibiotics and initially improved with no diarrhea or abdominal pain. Stool consistency has improved to  mushy; had not returned to soft and formed. Symptoms of abdominal pain, primarily in her lower abdomen, and watery diarrhea returned around 04/06/19. Associated urgency with incontinence at times. Having 4-5 watery stools daily and 1-2 watery stools at night. Also with sensation of having to have a BM but only passing mucous per rectum. Has seen blood on the toilet tissue. Started last week. Occurred 3 times. Has never had this in the past. None today. Last episode last Thursday. No melena. Reports history of hemorrhoids. Abdominal pain about 7/10. No nausea or vomiting. No lightheadedness, dizziness, or feeling like she will pass out. No fevers or chills. No weight loss. States she doesn't eat much. Has been this way for several months. Denies heartburn, acid reflux, or dysphagia. On Protonix 40 mg daily. Also admits to taking ibuprofen a few times a day every day for the last several months. Uses ibuprofen any time she has a pain.   Denies any presyncope or syncopal episodes. Denies sick contacts. No consumption of undercooked meats, no seafood, and no antibiotics other than the vancomycin she completed after hospital discharge.  No family history of ulcerative colitis, Crohn's disease, or colon cancer.  Last colonoscopy on 09/18/2009 with Dr. FOneida Alar  Findings included normal colon without polyps, masses, inflammatory changes, or diverticular AVMs.  Small internal hemorrhoids.  Otherwise normal rectum.      Past Medical History:  Diagnosis Date  . Allergy   . Cancer (HCC)    cervical cancer cells  . GERD (gastroesophageal reflux disease)   . Osteopenia     Past Surgical History:  Procedure Laterality Date  . ABDOMINAL HYSTERECTOMY    . BOWEL RESECTION N/A 06/22/2016   Procedure: SMALL BOWEL RESECTION;  Surgeon: JCorene Cornea  Yolanda Bonine, MD;  Location: [facility];  Service: General;  Laterality: N/A;  . CHOLECYSTECTOMY  2005  . FRACTURE SURGERY     rt foot  . LAPAROTOMY N/A 06/22/2016   Procedure:  EXPLORATORY LAPAROTOMY;  Surgeon: Vickie Epley, MD;  Location: [facility];  Service: General;  Laterality: N/A;  . LYSIS OF ADHESION N/A 06/22/2016   Procedure: LYSIS OF ADHESION;  Surgeon: Vickie Epley, MD;  Location: [facility];  Service: General;  Laterality: N/A;    Prior to Admission medications   Medication Sig Start Date End Date Taking? Authorizing Provider  pantoprazole (PROTONIX) 40 MG tablet Take 1 tablet (40 mg total) by mouth daily. 03/17/19 04/16/19 Yes Shah, Pratik D, DO  vitamin B-12 1000 MCG tablet Take 1 tablet (1,000 mcg total) by mouth daily. 03/17/19 04/16/19 Yes Shah, Pratik D, DO  DULoxetine (CYMBALTA) 30 MG capsule TAKE 1 CAPSULE BY MOUTH EVERY DAY Patient taking differently: Take 30 mg by mouth daily.  04/02/19   Trula Slade, DPM  saccharomyces boulardii (FLORASTOR) 250 MG capsule Take 1 capsule (250 mg total) by mouth 2 (two) times daily. 03/26/19   Susy Frizzle, MD  Vancomycin HCl 250 MG/5ML SOLR Take 2.5 mLs by mouth every 6 (six) hours.    [provider]  Vitamin D, Ergocalciferol, (DRISDOL) 1.25 MG (50000 UT) CAPS capsule Take 1 capsule (50,000 Units total) by mouth every 7 (seven) days. Patient not taking: Reported on 04/15/2019 01/29/19   Trula Slade, DPM    Current Facility-Administered Medications  Medication Dose Route Frequency Provider Last Rate Last Dose  . 0.9 % NaCl with KCl 40 mEq / L  infusion   Intravenous Continuous Reubin Milan, MD 125 mL/hr at 04/16/19 0456 125 mL/hr at 04/16/19 0456  . acetaminophen (TYLENOL) tablet 650 mg  650 mg Oral Q6H PRN Reubin Milan, MD       Or  . acetaminophen (TYLENOL) suppository 650 mg  650 mg Rectal Q6H PRN Reubin Milan, MD      . DULoxetine (CYMBALTA) DR capsule 30 mg  30 mg Oral Daily Reubin Milan, MD      . pantoprazole ([place]) EC tablet 40 mg  40 mg Oral Daily Reubin Milan, MD      . prochlorperazine (COMPAZINE) injection 5 mg  5 mg Intravenous Q4H  PRN Reubin Milan, MD      . saccharomyces boulardii [facility]) capsule 250 mg  250 mg Oral BID Reubin Milan, MD      . vancomycin (VANCOCIN) 50 mg/mL oral solution 125 mg  125 mg Oral QID Reubin Milan, MD       Followed by  . [START ON 04/30/2019] vancomycin (VANCOCIN) 50 mg/mL oral solution 125 mg  125 mg Oral BID Reubin Milan, MD       Followed by  . [START ON 05/07/2019] vancomycin (VANCOCIN) 50 mg/mL oral solution 125 mg  125 mg Oral Daily Reubin Milan, MD       Followed by  . [START ON 05/14/2019] vancomycin (VANCOCIN) 50 mg/mL oral solution 125 mg  125 mg Oral Glenna Durand, MD       Followed by  . [START ON 05/22/2019] vancomycin (VANCOCIN) 50 mg/mL oral solution 125 mg  125 mg Oral Q3 days Reubin Milan, MD      . vitamin B-12 (CYANOCOBALAMIN) tablet 1,000 mcg  1,000 mcg Oral Daily Reubin Milan, MD  Current Outpatient Medications  Medication Sig Dispense Refill  . pantoprazole (PROTONIX) 40 MG tablet Take 1 tablet (40 mg total) by mouth daily. 30 tablet 0  . vitamin B-12 1000 MCG tablet Take 1 tablet (1,000 mcg total) by mouth daily. 30 tablet 0  . DULoxetine (CYMBALTA) 30 MG capsule TAKE 1 CAPSULE BY MOUTH EVERY DAY (Patient taking differently: Take 30 mg by mouth daily. ) 90 capsule 0  . saccharomyces boulardii (FLORASTOR) 250 MG capsule Take 1 capsule (250 mg total) by mouth 2 (two) times daily. 60 capsule 0  . Vancomycin HCl 250 MG/5ML SOLR Take 2.5 mLs by mouth every 6 (six) hours.    . Vitamin D, Ergocalciferol, (DRISDOL) 1.25 MG (50000 UT) CAPS capsule Take 1 capsule (50,000 Units total) by mouth every 7 (seven) days. (Patient not taking: Reported on 04/15/2019) 5 capsule 0    Allergies as of 04/15/2019 - Review Complete 04/15/2019  Allergen Reaction Noted  . Codeine Itching     Family History  Problem Relation Age of Onset  . COPD Mother   . Heart disease Mother   . Miscarriages / [place] Mother   .  Vision loss Mother        from shingles  . COPD Father   . Cancer Brother        leukemia  . Early death Brother   . Colon cancer Neg Hx   . Colon polyps Neg Hx   . Inflammatory bowel disease Neg Hx     Social History   Socioeconomic History  . Marital status: Widowed    Spouse name: Not on file  . Number of children: Not on file  . Years of education: Not on file  . Highest education level: Not on file  Occupational History  . Not on file  Social Needs  . Financial resource strain: Not on file  . Food insecurity    Worry: Not on file    Inability: Not on file  . Transportation needs    Medical: Not on file    Non-medical: Not on file  Tobacco Use  . Smoking status: Never Smoker  . Smokeless tobacco: Never Used  Substance and Sexual Activity  . Alcohol use: No  . Drug use: No  . Sexual activity: Not Currently  Lifestyle  . Physical activity    Days per week: Not on file    Minutes per session: Not on file  . Stress: Not on file  Relationships  . Social Herbalist on phone: Not on file    Gets together: Not on file    Attends religious service: Not on file    Active member of club or organization: Not on file    Attends meetings of clubs or organizations: Not on file    Relationship status: Not on file  . Intimate partner violence    Fear of current or ex partner: Not on file    Emotionally abused: Not on file    Physically abused: Not on file    Forced sexual activity: Not on file  Other Topics Concern  . Not on file  Social History Narrative  . Not on file    Review of Systems: Gen: See HPI CV: Denies chest pain, heart palpitations Resp: Denies shortness of breath or cough GI: See HPI GU : Denies urinary burning, urinary frequency, urinary incontinence.  MS: Denies joint pain Derm: Denies rash Psych: Denies depression, anxiety Heme: See HPI  Physical Exam: Vital  signs in last 24 hours: Temp:  [98.2 F (36.8 C)] 98.2 F (36.8 C)  (11/09 1747) Pulse Rate:  [65-94] 65 (11/10 0700) Resp:  [12-16] 15 (11/10 0700) BP: (110-133)/(64-73) 110/67 (11/10 0700) SpO2:  [97 %-100 %] 98 % (11/10 0700) Weight:  [55.3 kg] 55.3 kg (11/09 1747)   General:   Alert,  Well-developed, well-nourished, pleasant and cooperative in NAD Head:  Normocephalic and atraumatic. Eyes:  Sclera clear, no icterus.   Conjunctiva pink. Ears:  Normal auditory acuity. Nose:  No deformity, discharge,  or lesions. Lungs:  Clear throughout to auscultation.   No wheezes, crackles, or rhonchi. No acute distress. Heart:  Regular rate and rhythm; no murmurs, clicks, rubs,  or gallops. Abdomen:  Soft, and nondistended.  Moderate to significant tenderness to palpation diffusely, greatest in the epigastric area and left lower quadrant.  No masses, hepatosplenomegaly or hernias noted. Normal bowel sounds, without guarding, and without rebound.   Rectal:  Deferred  Msk:  Symmetrical without gross deformities. Normal posture. Extremities:  Without edema. Neurologic:  Alert and  oriented x4;  grossly normal neurologically. Skin:  Intact without significant lesions or rashes. Psych: Normal mood and affect.  Lab Results: Recent Labs    04/15/19 1911 04/16/19 0440  WBC 9.6 7.1  HGB 10.9* 10.1*  HCT 34.7* 32.0*  PLT 328 257   BMET Recent Labs    04/15/19 1911 04/16/19 0440  NA 136 139  K 2.9* 4.3  CL 104 110  CO2 24 23  GLUCOSE 125* 97  BUN 29* 22  CREATININE 1.20* 0.95  CALCIUM 8.4* 8.2*   LFT Recent Labs    04/15/19 1911 04/16/19 0440  PROT 7.2 5.7*  ALBUMIN 3.5 2.8*  AST 18 13*  ALT 14 12  ALKPHOS 78 63  BILITOT 0.2* 0.5   PT/INR No results for input(s): LABPROT, INR in the last 72 hours. Hepatitis Panel No results for input(s): HEPBSAG, HCVAB, HEPAIGM, HEPBIGM in the last 72 hours. C-Diff Recent Labs    04/16/19 0006  CDIFFTOX POSITIVE*    Studies/Results: Ct Abdomen Pelvis W Contrast  Result Date: 04/15/2019 CLINICAL DATA:   Abdominal pain, recent history of colitis EXAM: CT ABDOMEN AND PELVIS WITH CONTRAST TECHNIQUE: Multidetector CT imaging of the abdomen and pelvis was performed using the standard protocol following bolus administration of intravenous contrast. CONTRAST:  11m OMNIPAQUE IOHEXOL 300 MG/ML  SOLN COMPARISON:  March 11, 2019 FINDINGS: Lower chest: The visualized heart size within normal limits. No pericardial fluid/thickening. A small hiatal hernia is present. The visualized portions of the lungs are clear. Hepatobiliary: The liver is normal in density without focal abnormality.The main portal vein is patent. The patient is status post cholecystectomy. Mild dilatation of the extrahepatic biliary duct. Pancreas: Unremarkable. No pancreatic ductal dilatation or surrounding inflammatory changes. Spleen: Normal in size without focal abnormality. Adrenals/Urinary Tract: Both adrenal glands appear normal. The kidneys and collecting system appear normal without evidence of urinary tract calculus or hydronephrosis. Bladder is unremarkable. Stomach/Bowel: The stomach and small bowel are unremarkable. There is diffuse wall thickening seen within the cecum, sigmoid colon and rectum. There is mild fat stranding changes seen around the sigmoid rectal junction. No pericolonic free fluid or free air is seen. No focal area of stenosis is seen. The appendix is unremarkable. Vascular/Lymphatic: There are no enlarged mesenteric, retroperitoneal, or pelvic lymph nodes. Scattered aortic atherosclerotic calcifications are seen without aneurysmal dilatation. Reproductive: The patient is status post hysterectomy. No adnexal masses or collections seen. Other:  No evidence of abdominal wall mass or hernia. Musculoskeletal: No acute or significant osseous findings. IMPRESSION: 1. Wall thickening with surrounding inflammatory changes involving the cecum, sigmoid colon, and rectum, consistent with colitis. This could be due to infectious or  inflammatory process. Not thought to be due to ischemic colitis. Given patient the recent episode of colitis on March 11, 2019 would recommend direct visualization upon resolution of symptoms for underlying mass lesion. 2.  Aortic Atherosclerosis (ICD10-I70.0). Electronically Signed   By: Prudencio Pair M.D.   On: 04/15/2019 23:10    Impression: 68 y.o. year old female medical history significant for seasonal allergies, unspecified cervical cancers/p total abdominal hysterectomy, GERD,H. Pylori in 2011, closed loop small bowel obstruction attributable to post-hysterectomy adhesions with resection of 38 cm of distal jejunumin 2018,and osteopenia who was admitted on 04/15/2019 for recurrent C. difficile colitis.  She was recently admitted from 03/11/2019-03/16/2023 pancolitis presumed infectious and treated for C. Difficile due to risk factors of recent antibiotics (although C. difficile testing was negative) with vancomycin 125 mg QID x10 days.  Patient reports clinical improvement with resolution of abdominal pain and improvement in stool consistency and frequency to 2-3 mushy BMs daily.  She had return of abdominal pain and watery diarrhea with associated urgency and incontinence at times on 04/06/2019 that has persisted.  Also passing mucus per rectum and reports 3 episodes of tissue hematochezia with last episode on 04/10/2019.  Denies sick contacts, antibiotics other than the vancomycin she was discharged on, consumption of undercooked meats, or seafood.  On admission, WBC 9.6, hemoglobin 10.9 (stable).  C. difficile toxin and antigen positive. GI pathogen panel pending. CT abdomen and pelvis with wall thickening with surrounding inflammatory changes involving the cecum, sigmoid colon, and rectum consistent with colitis.  Likely infectious versus inflammatory.  Less likely ischemic colitis.  Abdominal exam today with diffuse moderate to significant tenderness to palpation greatest in the epigastric area and  left lower quadrant.   Suspect this is recurrent C. difficile as patient reports resolution of abdominal pain and improvement in stool frequency and consistency for 2 weeks prior to return of significant abdominal pain and diarrhea. Agree with pulsed vancomycin taper that has already been started. Considered stopping PPI; however, with significant ibuprofen use outpatient, PPI is needed.  Ultimately, she is going to need outpatient colonoscopy for evaluation of bowel wall thickening.  Could consider inpatient colonoscopy if she does not improve.  Anemia: Hemoglobin 10.9 on admission which is improved from discharge hemoglobin of 9.6 on 03/16/2019.  Hemoglobin slightly decreased to 10.1 today but overall stable.  She reports 3 episodes of tissue hematochezia last week with last episode on Thursday, 04/10/2019. No overt GI bleeding at this time.  Suspect slight decrease is likely dilutional as she did receive IV fluids.  Continue to monitor for signs of overt GI bleeding.  Plan: Continue vancomycin 125 mg 4 times daily with plans for pulsed taper. Continue supportive measures. Follow-up on GI pathogen panel as it comes available. Continue to monitor for overt GI bleeding. Avoid NSAIDs. Patient will need outpatient colonoscopy once acute illness has resolved.  Consider inpatient colonoscopy if she does not improve.   LOS: 0 days    04/16/2019, 10:06 AM   Aliene Altes, [facility]

## 2019-04-16 NOTE — ED Notes (Signed)
Will begin LR after patient is done eating.

## 2019-04-16 NOTE — Progress Notes (Signed)
Per HPI: Rebecca Osborne is a 68 y.o. female with medical history significant of seasonal allergies, unspecified great cervical cancer, GERD, osteopeniawho is coming to the emergency department complaints of diarrheaand abdominal painfor the past 5 to 6 weeks.  She was admitted 5 weeks ago for rehydration and electrolyte replacement.  GI pathogen panel and C. difficile testing was negative.  She was recently started on oral vancomycin as an outpatient.  She denies nausea, vomiting, constipation, melena or hematochezia.  She states that her bowel movements now show mostly mucus content.  She denies fever, but complains of chills, fatigue and decreased appetite.  No sore throat, rhinorrhea, dyspnea, wheezing or hemoptysis.  No chest pain, palpitations, diaphoresis, PND, orthopnea or pitting edema of the lower extremities.  Denies dysuria, frequency or hematuria.  No polyuria, no polydipsia, no polyphagia or blurred vision.  Patient seen and evaluated this AM.  She has been admitted with C. difficile colitis and started on oral vancomycin.  GI consulted with evaluation pending.  Will maintain on IV fluid and advance diet today.  Recheck a.m. labs.  Hypokalemia has resolved.  Anticipate discharge in 1 to 2 days once improvement noted and diarrhea and tolerating diet.  Total care time: 20 minutes.

## 2019-04-16 NOTE — Progress Notes (Signed)
Pt is in room, resting comfortably. She has used the bathroom two times since being moved to the unit. She currently has LR infusing at 60m/hr. Will continue to monitor pt.

## 2019-04-16 NOTE — ED Notes (Signed)
Date and time results received: 04/16/19 2:10 AM  (use smartphrase ".now" to insert current time)  Test:  c diff Critical Value:  +   Name of Provider Notified: ortiz  Orders Received? Or Actions Taken?: Orders Received - See Orders for details

## 2019-04-17 DIAGNOSIS — A0472 Enterocolitis due to Clostridium difficile, not specified as recurrent: Secondary | ICD-10-CM

## 2019-04-17 DIAGNOSIS — N179 Acute kidney failure, unspecified: Secondary | ICD-10-CM

## 2019-04-17 LAB — CBC
HCT: 32.7 % — ABNORMAL LOW (ref 36.0–46.0)
Hemoglobin: 10.4 g/dL — ABNORMAL LOW (ref 12.0–15.0)
MCH: 29.5 pg (ref 26.0–34.0)
MCHC: 31.8 g/dL (ref 30.0–36.0)
MCV: 92.9 fL (ref 80.0–100.0)
Platelets: 257 10*3/uL (ref 150–400)
RBC: 3.52 MIL/uL — ABNORMAL LOW (ref 3.87–5.11)
RDW: 12.8 % (ref 11.5–15.5)
WBC: 12.2 10*3/uL — ABNORMAL HIGH (ref 4.0–10.5)
nRBC: 0 % (ref 0.0–0.2)

## 2019-04-17 LAB — BASIC METABOLIC PANEL
Anion gap: 8 (ref 5–15)
BUN: 9 mg/dL (ref 8–23)
CO2: 22 mmol/L (ref 22–32)
Calcium: 8.8 mg/dL — ABNORMAL LOW (ref 8.9–10.3)
Chloride: 108 mmol/L (ref 98–111)
Creatinine, Ser: 0.93 mg/dL (ref 0.44–1.00)
GFR calc Af Amer: >60 mL/min (ref >60)
GFR calc non Af Amer: >60 mL/min (ref >60)
Glucose, Bld: 98 mg/dL (ref 70–99)
Potassium: 4.5 mmol/L (ref 3.5–5.1)
Sodium: 138 mmol/L (ref 135–145)

## 2019-04-17 LAB — MAGNESIUM: Magnesium: 1.5 mg/dL — ABNORMAL LOW (ref 1.7–2.4)

## 2019-04-17 MED ORDER — MAGNESIUM SULFATE 2 GM/50ML IV SOLN
2.0000 g | Freq: Once | INTRAVENOUS | Status: AC
  Administered 2019-04-17: 2 g via INTRAVENOUS
  Filled 2019-04-17: qty 50

## 2019-04-17 MED ORDER — ADULT MULTIVITAMIN W/MINERALS CH
1.0000 | ORAL_TABLET | Freq: Every day | ORAL | Status: DC
  Administered 2019-04-17 – 2019-04-20 (×4): 1 via ORAL
  Filled 2019-04-17 (×4): qty 1

## 2019-04-17 MED ORDER — BOOST / RESOURCE BREEZE PO LIQD CUSTOM
1.0000 | Freq: Three times a day (TID) | ORAL | Status: DC
  Administered 2019-04-17 – 2019-04-20 (×10): 1 via ORAL

## 2019-04-17 NOTE — Progress Notes (Addendum)
PROGRESS NOTE    Rebecca Osborne  [id number]:[id number] DOB: Mar 02, 1951 DOA: 04/15/2019 PCP: Susy Frizzle, MD    Brief Narrative:  68 year old female who presented with abdominal pain and diarrhea.  She has significant past medical history for seasonal allergies, cervical cancer, GERD and osteopenia.  She reported 5 to 6 weeks of persistent diarrhea and abdominal pain.  Recent hospitalization about 5 weeks ago for dehydration and abnormal electrolytes.  Her symptoms have been persistent despite oral vancomycin.  On her initial physical examination temperature 98, pulse rate 94, respiratory rate 12, blood pressure 123/73, oxygen saturation 100% on room air.  Her lungs are clear to auscultation bilaterally, heart S1-S2 present rhythm, abdomen with mild left lower quadrant abdominal pain, no lower extremity edema. CT of the abdomen with wall thickening with surrounding inflammatory changes involving the cecum, sigmoid colon and rectum, consistent with colitis.  Patient was admitted to the hospital working diagnosis of colitis.  She was admitted to the hospital, further work-up showed positive C. difficile and patient was started on oral vancomycin.  Gastroenterology was consulted.  Assessment & Plan:   Principal Problem:   C. difficile colitis Active Problems:   GERD   Pancolitis ([place])   Normocytic anemia   Hypokalemia   Colitis   1. Acute c diff colitis and diarrhea. Patient with improvement of symptoms but not yet back to baseline, continue to have diarrhea and abdominal pain. Worsening leukocytosis up to 12,2, patient with poor oral intake. Will continue medical therapy with oral vancomycin and follow GI recommendations.   2. Hypokalemia and hypomagnesemia. Likely due to gastrointestinal losses, renal function with serum cr at 0,93, K now up to 4,5 and serum bicarbonate at 22. Mg at 1,5. Will add 2 grams of mag sulfate and will follow on electrolytes in am, continue IV fluids for hydration/  balanced electrolyte solutions with LR.   3. GERD. Continue pantoprazole.  4. Depression. Continue with duloxetine.   DVT prophylaxis: enoxaparin   Code Status:  full Family Communication: no family at the bedside  Disposition Plan/ discharge barriers:  Pending clinical improvement.   Body mass index is 23.05 kg/m. Malnutrition Type:  Nutrition Problem: Inadequate oral intake Etiology: altered GI function, diarrhea(pancolitis;C diff)   Malnutrition Characteristics:  Signs/Symptoms: energy intake < 75% for > 7 days   Nutrition Interventions:  Interventions: MVI, Boost Breeze  RN Pressure Injury Documentation:     Consultants:   GI  Procedures:     Antimicrobials:       Subjective: Patient continue to have diarrhea and abdominal pain, no nausea or vomiting, but positive poor appetite.   Objective: Vitals:   04/16/19 0700 04/16/19 1058 04/16/19 2207 04/17/19 0550  BP: 110/67 119/74 127/70 138/68  Pulse: 65 68 77 84  Resp: 15 15 18 16   Temp:   98.3 F (36.8 C) 98.6 F (37 C)  TempSrc:   Oral Oral  SpO2: 98% 99% 100% 98%  Weight:      Height:        Intake/Output Summary (Last 24 hours) at 04/17/2019 1404 Last data filed at 04/17/2019 0900 Gross per 24 hour  Intake 2183 ml  Output -  Net 2183 ml   Filed Weights   04/15/19 1747  Weight: 55.3 kg    Examination:   General: deconditioned and ill looking appearing Neurology: Awake and alert, non focal  E ENT: positive pallor, no icterus, oral mucosa dry.  Cardiovascular: No JVD. S1-S2 present, rhythmic, no gallops, rubs, or  murmurs. No lower extremity edema. Pulmonary: positive breath sounds bilaterally, adequate air movement, no wheezing, rhonchi or rales. Gastrointestinal. Abdomen tender to palpation with no organomegaly, no rebound or guarding Skin. No rashes Musculoskeletal: no joint deformities     Data Reviewed: I have personally reviewed following labs and imaging studies  CBC:  Recent Labs  Lab 04/15/19 1911 04/16/19 0440 04/17/19 0624  WBC 9.6 7.1 12.2*  NEUTROABS 6.9 4.3  --   HGB 10.9* 10.1* 10.4*  HCT 34.7* 32.0* 32.7*  MCV 92.8 92.5 92.9  PLT 328 257 244   Basic Metabolic Panel: Recent Labs  Lab 04/15/19 1911 04/16/19 0440 04/17/19 0624  NA 136 139 138  K 2.9* 4.3 4.5  CL 104 110 108  CO2 24 23 22   GLUCOSE 125* 97 98  BUN 29* 22 9  CREATININE 1.20* 0.95 0.93  CALCIUM 8.4* 8.2* 8.8*  MG 1.9  --  1.5*  PHOS 4.6  --   --    GFR: Estimated Creatinine Clearance: 43.7 mL/min (by C-G formula based on SCr of 0.93 mg/dL). Liver Function Tests: Recent Labs  Lab 04/15/19 1911 04/16/19 0440  AST 18 13*  ALT 14 12  ALKPHOS 78 63  BILITOT 0.2* 0.5  PROT 7.2 5.7*  ALBUMIN 3.5 2.8*   Recent Labs  Lab 04/15/19 1911  LIPASE 32   No results for input(s): AMMONIA in the last 168 hours. Coagulation Profile: No results for input(s): INR, PROTIME in the last 168 hours. Cardiac Enzymes: No results for input(s): CKTOTAL, CKMB, CKMBINDEX, TROPONINI in the last 168 hours. BNP (last 3 results) No results for input(s): PROBNP in the last 8760 hours. HbA1C: No results for input(s): HGBA1C in the last 72 hours. CBG: No results for input(s): GLUCAP in the last 168 hours. Lipid Profile: No results for input(s): CHOL, HDL, LDLCALC, TRIG, CHOLHDL, LDLDIRECT in the last 72 hours. Thyroid Function Tests: No results for input(s): TSH, T4TOTAL, FREET4, T3FREE, THYROIDAB in the last 72 hours. Anemia Panel: No results for input(s): VITAMINB12, FOLATE, FERRITIN, TIBC, IRON, RETICCTPCT in the last 72 hours.    Radiology Studies: I have reviewed all of the imaging during this hospital visit personally     Scheduled Meds: . DULoxetine  30 mg Oral Daily  . feeding supplement  1 Container Oral TID BM  . multivitamin with minerals  1 tablet Oral Daily  . pantoprazole  40 mg Oral Daily  . saccharomyces boulardii  250 mg Oral BID  . vancomycin  125 mg  Oral QID   Followed by  . [START ON 04/30/2019] vancomycin  125 mg Oral BID   Followed by  . [START ON 05/07/2019] vancomycin  125 mg Oral Daily   Followed by  . [START ON 05/14/2019] vancomycin  125 mg Oral QODAY   Followed by  . [START ON 05/22/2019] vancomycin  125 mg Oral Q3 days  . cyanocobalamin  1,000 mcg Oral Daily   Continuous Infusions: . lactated ringers 75 mL/hr at 04/17/19 0356     LOS: 1 day        Mauricio Gerome Apley, MD

## 2019-04-17 NOTE — Progress Notes (Signed)
Initial Nutrition Assessment  DOCUMENTATION CODES:   Not applicable  INTERVENTION:  -Boost Breeze po TID, each supplement provides 250 kcal and 9 grams of protein -MVI daily  NUTRITION DIAGNOSIS:   Inadequate oral intake related to altered GI function, diarrhea(pancolitis;C diff) as evidenced by energy intake < 75% for > 7 days.  GOAL:   Patient will meet greater than or equal to 90% of their needs  MONITOR:   PO intake, Supplement acceptance, I & O's, Labs, Weight trends  REASON FOR ASSESSMENT:   Malnutrition Screening Tool    ASSESSMENT:  68 year old female with past medical history significant for GERD, osteopenia, history of small bowel resection, cholecystectomy who presented to ED with complaints of diarrhea and abdominal pain for the past 5-6 weeks. CT abdomen consistent with colitis.  Patient admitted with pancolitis,  Cdiff toxin and antigen positive;GI pathogen panel pending  Per chart review, patient reports increased abdominal pain today and feels worse today. She had 3 watery BMs over night and 2 this morning. Patient reports tolerating dinner last night (mashed potatoes, cauliflower, and broccoli) and did not eat much for breakfast d/t dislike of food. RD will provide Boost Breeze to aid with calorie/protein needs.   Weight history reviewed - stable  Medications reviewed and include: Protonix, Vacomycin, Vitamin B12 Labs: Mg 1.5 (L), Hgb 10.4 (L), WBC 12.2 (H)  NUTRITION - FOCUSED PHYSICAL EXAM: Deferred until follow up   Diet Order:   Diet Order            Diet regular Room service appropriate? Yes; Fluid consistency: Thin  Diet effective now              EDUCATION NEEDS:   No education needs have been identified at this time  Skin:  Skin Assessment: Reviewed RN Assessment  Last BM:  11/11  Height:   Ht Readings from Last 1 Encounters:  04/15/19 5' 1"  (1.549 m)    Weight:   Wt Readings from Last 1 Encounters:  04/15/19 55.3 kg     Ideal Body Weight:  47.7 kg  BMI:  Body mass index is 23.05 kg/m.  Estimated Nutritional Needs:   Kcal:  1500-1700  Protein:  75-85  Fluid:  >/= 1.5 L/day   Lajuan Lines, RD, LDN Clinical Nutrition Office [phone] After Hours/Weekend Pager: [phone]

## 2019-04-17 NOTE — Progress Notes (Signed)
Subjective: States she is feeling worse than yesterday. Abdominal pain is worse. About 8/10 (7/10 yesterday). When laying still, pain starts to [place] off. When getting up to go to the bathroom, pain worse. Had 3 watery BMs over night and 2 so far this morning. Stools have a few "lumps" in them and mucous. No blood in the stool. No melena. Didn't eat much breakfast because, "It was not good." Had mashed potatoes, broccoli, and cauliflower for dinner last night and tolerated this well. No nausea or vomiting.  Objective: Vital signs in last 24 hours: Temp:  [98.3 F (36.8 C)-98.6 F (37 C)] 98.6 F (37 C) (11/11 0550) Pulse Rate:  [77-84] 84 (11/11 0550) Resp:  [16-18] 16 (11/11 0550) BP: (127-138)/(68-70) 138/68 (11/11 0550) SpO2:  [98 %-100 %] 98 % (11/11 0550) Last BM Date: 04/16/19 General:   Alert and oriented, pleasant, resting comfortably.  Head:  Normocephalic and atraumatic. Eyes:  No icterus, sclera clear. Conjuctiva pink.  Mouth:  Without lesions, mucosa pink and moist.  Abdomen:  Bowel sounds present, soft, non-distended. Moderate to significant tenderness to palpation diffusely, greatest in the epigastric and LLQ. No HSM or hernias noted. No rebound or guarding. No masses appreciated  Msk:  Symmetrical without gross deformities. Normal posture. Extremities:  Without edema. Neurologic:  Alert and  oriented x4;  grossly normal neurologically. Skin:  Warm and dry, intact without significant lesions.  Psych: Normal mood and affect.  Intake/Output from previous day: 11/10 0701 - 11/11 0700 In: 1943 [I.V.:1943] Out: -  Intake/Output this shift: Total I/O In: 240 [P.O.:240] Out: -   Lab Results: Recent Labs    04/15/19 1911 04/16/19 0440 04/17/19 0624  WBC 9.6 7.1 12.2*  HGB 10.9* 10.1* 10.4*  HCT 34.7* 32.0* 32.7*  PLT 328 257 257   BMET Recent Labs    04/15/19 1911 04/16/19 0440 04/17/19 0624  NA 136 139 138  K 2.9* 4.3 4.5  CL 104 110 108  CO2 24 23 22    GLUCOSE 125* 97 98  BUN 29* 22 9  CREATININE 1.20* 0.95 0.93  CALCIUM 8.4* 8.2* 8.8*   LFT Recent Labs    04/15/19 1911 04/16/19 0440  PROT 7.2 5.7*  ALBUMIN 3.5 2.8*  AST 18 13*  ALT 14 12  ALKPHOS 78 63  BILITOT 0.2* 0.5    Studies/Results: Ct Abdomen Pelvis W Contrast  Result Date: 04/15/2019 CLINICAL DATA:  Abdominal pain, recent history of colitis EXAM: CT ABDOMEN AND PELVIS WITH CONTRAST TECHNIQUE: Multidetector CT imaging of the abdomen and pelvis was performed using the standard protocol following bolus administration of intravenous contrast. CONTRAST:  117m OMNIPAQUE IOHEXOL 300 MG/ML  SOLN COMPARISON:  March 11, 2019 FINDINGS: Lower chest: The visualized heart size within normal limits. No pericardial fluid/thickening. A small hiatal hernia is present. The visualized portions of the lungs are clear. Hepatobiliary: The liver is normal in density without focal abnormality.The main portal vein is patent. The patient is status post cholecystectomy. Mild dilatation of the extrahepatic biliary duct. Pancreas: Unremarkable. No pancreatic ductal dilatation or surrounding inflammatory changes. Spleen: Normal in size without focal abnormality. Adrenals/Urinary Tract: Both adrenal glands appear normal. The kidneys and collecting system appear normal without evidence of urinary tract calculus or hydronephrosis. Bladder is unremarkable. Stomach/Bowel: The stomach and small bowel are unremarkable. There is diffuse wall thickening seen within the cecum, sigmoid colon and rectum. There is mild fat stranding changes seen around the sigmoid rectal junction. No pericolonic free fluid or  free air is seen. No focal area of stenosis is seen. The appendix is unremarkable. Vascular/Lymphatic: There are no enlarged mesenteric, retroperitoneal, or pelvic lymph nodes. Scattered aortic atherosclerotic calcifications are seen without aneurysmal dilatation. Reproductive: The patient is status post hysterectomy.  No adnexal masses or collections seen. Other: No evidence of abdominal wall mass or hernia. Musculoskeletal: No acute or significant osseous findings. IMPRESSION: 1. Wall thickening with surrounding inflammatory changes involving the cecum, sigmoid colon, and rectum, consistent with colitis. This could be due to infectious or inflammatory process. Not thought to be due to ischemic colitis. Given patient the recent episode of colitis on March 11, 2019 would recommend direct visualization upon resolution of symptoms for underlying mass lesion. 2.  Aortic Atherosclerosis (ICD10-I70.0). Electronically Signed   By: Prudencio Pair M.D.   On: 04/15/2019 23:10    Assessment: 68 y.o.year old femalewho was admitted on 04/15/2019 for recurrent C. difficile colitis. She was recently admitted from 03/11/2019-03/16/2023 pancolitis presumed infectious and treated for C. Difficile due to risk factors of recent antibiotics (although C. difficile testing was negative) with vancomycin 125 mg QID x10 days.  She had resolution of abdominal pain and improvement of diarrhea; however, abdominal pain and frequent watery diarrhea with urgency and incontinence returned on 04/06/19. Also with 3 episodes of tissue hematochezia with last episode on 04/10/2019. This admission, C. diff toxin and antigen positive, GI pathogen panel negative. CT abdomen and pelvis with wall thickening with surrounding inflammatory changes involving the cecum, sigmoid colon, and rectum consistent with colitis. She has been restarted on vancomycin 125 QID with plans for pulsed taper.   Patient has had 3 doses of vancomycin at this point. WBC up to 12.2 today. Kidney function normal. No fever. She reports worsening abdominal pain 8/10 today (7/10 yesterday). Continues with watery diarrhea. No brbpr or melena. No nausea or vomiting. Overall, her exam is about the same as yesterday with moderate to significant tenderness to palpation diffusely, greatest in the  epigastric area and LLQ. Doubt patient has developed a complication such as ileus or megacolon as her abdomen is not distended and she continues with diarrhea. Suspect we just need to give the antibiotics a little more time to work as we haven't completed 24 hours yet. Will plan to continue current regimen of vancomycin and monitor closely. Outpatient colonoscopy after acute illness. Consider inpatient if no improvement.   Anemia: Hemoglobin 10.9 on admission which is improved from discharge hemoglobin of 9.6 on 03/16/2019. Hemoglobin 10.4 today (stable/improved from 10.1 yesterday). She reports 3 episodes of tissue hematochezia last week with last episode on Thursday, 04/10/2019. No overt GI bleeding at this time. Continue to monitor.   Plan: Continue vancomycin 125 mg 4 times daily with plans for pulsed taper. Continue supportive measures. Continue to monitor for overt GI bleeding. Patient will need outpatient colonoscopy once acute illness has resolved.   Could consider inpatient colonoscopy if she does not improve. Avoid NSAIDs moving forward as she had been taking ibuprofen daily x several months.     LOS: 1 day    04/17/2019, 11:06 AM   Aliene Altes, [facility]astro[facility]

## 2019-04-18 DIAGNOSIS — A0472 Enterocolitis due to Clostridium difficile, not specified as recurrent: Principal | ICD-10-CM

## 2019-04-18 LAB — CBC WITH DIFFERENTIAL/PLATELET
Abs Immature Granulocytes: 0.08 10*3/uL — ABNORMAL HIGH (ref 0.00–0.07)
Basophils Absolute: 0.1 10*3/uL (ref 0.0–0.1)
Basophils Relative: 1 %
Eosinophils Absolute: 0.5 10*3/uL (ref 0.0–0.5)
Eosinophils Relative: 3 %
HCT: 33.5 % — ABNORMAL LOW (ref 36.0–46.0)
Hemoglobin: 10.6 g/dL — ABNORMAL LOW (ref 12.0–15.0)
Immature Granulocytes: 1 %
Lymphocytes Relative: 10 %
Lymphs Abs: 1.4 10*3/uL (ref 0.7–4.0)
MCH: 29.1 pg (ref 26.0–34.0)
MCHC: 31.6 g/dL (ref 30.0–36.0)
MCV: 92 fL (ref 80.0–100.0)
Monocytes Absolute: 0.7 10*3/uL (ref 0.1–1.0)
Monocytes Relative: 5 %
Neutro Abs: 11.7 10*3/uL — ABNORMAL HIGH (ref 1.7–7.7)
Neutrophils Relative %: 80 %
Platelets: 260 10*3/uL (ref 150–400)
RBC: 3.64 MIL/uL — ABNORMAL LOW (ref 3.87–5.11)
RDW: 12.8 % (ref 11.5–15.5)
WBC: 14.5 10*3/uL — ABNORMAL HIGH (ref 4.0–10.5)
nRBC: 0 % (ref 0.0–0.2)

## 2019-04-18 LAB — BASIC METABOLIC PANEL
Anion gap: 10 (ref 5–15)
BUN: 8 mg/dL (ref 8–23)
CO2: 26 mmol/L (ref 22–32)
Calcium: 8.7 mg/dL — ABNORMAL LOW (ref 8.9–10.3)
Chloride: 104 mmol/L (ref 98–111)
Creatinine, Ser: 0.88 mg/dL (ref 0.44–1.00)
GFR calc Af Amer: >60 mL/min (ref >60)
GFR calc non Af Amer: >60 mL/min (ref >60)
Glucose, Bld: 102 mg/dL — ABNORMAL HIGH (ref 70–99)
Potassium: 4 mmol/L (ref 3.5–5.1)
Sodium: 140 mmol/L (ref 135–145)

## 2019-04-18 LAB — MAGNESIUM: Magnesium: 1.8 mg/dL (ref 1.7–2.4)

## 2019-04-18 MED ORDER — VANCOMYCIN 50 MG/ML ORAL SOLUTION: 125.0000 mg | Freq: Every day | ORAL | Status: DC

## 2019-04-18 MED ORDER — VANCOMYCIN 50 MG/ML ORAL SOLUTION
500.0000 mg | Freq: Four times a day (QID) | ORAL | Status: DC
  Administered 2019-04-18 – 2019-04-20 (×10): 500 mg via ORAL
  Filled 2019-04-18 (×17): qty 10

## 2019-04-18 MED ORDER — VANCOMYCIN 50 MG/ML ORAL SOLUTION: 500.0000 mg | Freq: Four times a day (QID) | ORAL | Status: DC

## 2019-04-18 MED ORDER — HYDROMORPHONE HCL 1 MG/ML IJ SOLN
0.5000 mg | Freq: Once | INTRAMUSCULAR | Status: AC
  Administered 2019-04-18: 10:00:00 0.5 mg via INTRAVENOUS
  Filled 2019-04-18: qty 0.5

## 2019-04-18 MED ORDER — VANCOMYCIN 50 MG/ML ORAL SOLUTION: 125.0000 mg | ORAL | Status: DC

## 2019-04-18 MED ORDER — OXYCODONE HCL 5 MG PO TABS
5.0000 mg | ORAL_TABLET | Freq: Four times a day (QID) | ORAL | Status: DC | PRN
  Administered 2019-04-18 – 2019-04-19 (×3): 5 mg via ORAL
  Filled 2019-04-18 (×3): qty 1

## 2019-04-18 MED ORDER — VANCOMYCIN 50 MG/ML ORAL SOLUTION: 125.0000 mg | Freq: Two times a day (BID) | ORAL | Status: DC

## 2019-04-18 NOTE — Progress Notes (Signed)
PROGRESS NOTE    Rebecca Osborne  DTO:[id number] DOB: [birth year] DOA: 04/15/2019 PCP: Susy Frizzle, MD    Brief Narrative:  68 year old female who presented with abdominal pain and diarrhea.  She has significant past medical history for seasonal allergies, cervical cancer, GERD and osteopenia.  She reported 5 to 6 weeks of persistent diarrhea and abdominal pain.  Recent hospitalization about 5 weeks ago for dehydration and abnormal electrolytes.  Her symptoms have been persistent despite oral vancomycin.  On her initial physical examination temperature 98, pulse rate 94, respiratory rate 12, blood pressure 123/73, oxygen saturation 100% on room air.  Her lungs are clear to auscultation bilaterally, heart S1-S2 present rhythm, abdomen with mild left lower quadrant abdominal pain, no lower extremity edema. CT of the abdomen with wall thickening with surrounding inflammatory changes involving the cecum, sigmoid colon and rectum, consistent with colitis.  Patient was admitted to the hospital working diagnosis of colitis.  She was admitted to the hospital, further work-up showed positive C. difficile and patient was started on oral vancomycin.  Gastroenterology was consulted.  Assessment & Plan:   Principal Problem:   C. difficile colitis Active Problems:   GERD   Pancolitis ([place])   Normocytic anemia   Hypokalemia   Colitis   1. Acute c diff colitis and diarrhea.  Continues to have significant abdominal pain.  She also has persistent diarrhea.  WBC count is trending up.  Vancomycin dose has been increased to 500 mg every 6 hours.  If symptoms persist, may need to have repeat CT scan.  GI following.  2. Hypokalemia and hypomagnesemia. Likely due to gastrointestinal losses, replaced.  3. GERD.  Protonix discontinued  4. Depression. Continue with duloxetine.   DVT prophylaxis: enoxaparin   Code Status:  full Family Communication: no family at the bedside  Disposition Plan/ discharge  barriers:  Pending clinical improvement.   Body mass index is 23.05 kg/m. Malnutrition Type:  Nutrition Problem: Inadequate oral intake Etiology: altered GI function, diarrhea(pancolitis;C diff)   Malnutrition Characteristics:  Signs/Symptoms: energy intake < 75% for > 7 days   Nutrition Interventions:  Interventions: MVI, Boost Breeze  RN Pressure Injury Documentation:     Consultants:   GI  Procedures:     Antimicrobials:   P.o. vancomycin   Subjective: Complains of diffuse abdominal pain.  No vomiting.  Reports her stools are starting to become more solid.  She had 3-4 bowel movements overnight and 3-4 bowel movements today.  Objective: Vitals:   04/17/19 2021 04/17/19 2137 04/18/19 0535 04/18/19 1346  BP:  (!) 163/75 (!) 160/71 (!) 149/77  Pulse:  84 71 75  Resp:  18 16 17   Temp:  98.1 F (36.7 C) 98 F (36.7 C) 98.1 F (36.7 C)  TempSrc:  Oral Oral Oral  SpO2: 97% 100% 99%   Weight:      Height:        Intake/Output Summary (Last 24 hours) at 04/18/2019 2055 Last data filed at 04/18/2019 1700 Gross per 24 hour  Intake 2390.02 ml  Output -  Net 2390.02 ml   Filed Weights   04/15/19 1747  Weight: 55.3 kg    Examination:   General exam: Alert, awake, oriented x 3 Respiratory system: Clear to auscultation. Respiratory effort normal. Cardiovascular system:RRR. No murmurs, rubs, gallops. Gastrointestinal system: Abdomen is nondistended, soft and diffusely tender. No organomegaly or masses felt. Normal bowel sounds heard. Central nervous system: Alert and oriented. No focal neurological deficits. Extremities: No C/C/E, +  pedal pulses Skin: No rashes, lesions or ulcers Psychiatry: Judgement and insight appear normal. Mood & affect appropriate.       Data Reviewed: I have personally reviewed following labs and imaging studies  CBC: Recent Labs  Lab 04/15/19 1911 04/16/19 0440 04/17/19 0624 04/18/19 0514  WBC 9.6 7.1 12.2* 14.5*   NEUTROABS 6.9 4.3  --  11.7*  HGB 10.9* 10.1* 10.4* 10.6*  HCT 34.7* 32.0* 32.7* 33.5*  MCV 92.8 92.5 92.9 92.0  PLT 328 257 257 664   Basic Metabolic Panel: Recent Labs  Lab 04/15/19 1911 04/16/19 0440 04/17/19 0624 04/18/19 0514  NA 136 139 138 140  K 2.9* 4.3 4.5 4.0  CL 104 110 108 104  CO2 24 23 22 26   GLUCOSE 125* 97 98 102*  BUN 29* 22 9 8   CREATININE 1.20* 0.95 0.93 0.88  CALCIUM 8.4* 8.2* 8.8* 8.7*  MG 1.9  --  1.5* 1.8  PHOS 4.6  --   --   --    GFR: Estimated Creatinine Clearance: 46.2 mL/min (by C-G formula based on SCr of 0.88 mg/dL). Liver Function Tests: Recent Labs  Lab 04/15/19 1911 04/16/19 0440  AST 18 13*  ALT 14 12  ALKPHOS 78 63  BILITOT 0.2* 0.5  PROT 7.2 5.7*  ALBUMIN 3.5 2.8*   Recent Labs  Lab 04/15/19 1911  LIPASE 32   No results for input(s): AMMONIA in the last 168 hours. Coagulation Profile: No results for input(s): INR, PROTIME in the last 168 hours. Cardiac Enzymes: No results for input(s): CKTOTAL, CKMB, CKMBINDEX, TROPONINI in the last 168 hours. BNP (last 3 results) No results for input(s): PROBNP in the last 8760 hours. HbA1C: No results for input(s): HGBA1C in the last 72 hours. CBG: No results for input(s): GLUCAP in the last 168 hours. Lipid Profile: No results for input(s): CHOL, HDL, LDLCALC, TRIG, CHOLHDL, LDLDIRECT in the last 72 hours. Thyroid Function Tests: No results for input(s): TSH, T4TOTAL, FREET4, T3FREE, THYROIDAB in the last 72 hours. Anemia Panel: No results for input(s): VITAMINB12, FOLATE, FERRITIN, TIBC, IRON, RETICCTPCT in the last 72 hours.    Radiology Studies: I have reviewed all of the imaging during this hospital visit personally     Scheduled Meds: . DULoxetine  30 mg Oral Daily  . feeding supplement  1 Container Oral TID BM  . multivitamin with minerals  1 tablet Oral Daily  . saccharomyces boulardii  250 mg Oral BID  . vancomycin  500 mg Oral QID   Followed by  . [START ON  04/30/2019] vancomycin  125 mg Oral BID   Followed by  . [START ON 05/07/2019] vancomycin  125 mg Oral Daily   Followed by  . [START ON 05/14/2019] vancomycin  125 mg Oral QODAY   Followed by  . [START ON 05/22/2019] vancomycin  125 mg Oral Q3 days  . cyanocobalamin  1,000 mcg Oral Daily   Continuous Infusions: . lactated ringers 75 mL/hr at 04/18/19 1931     LOS: 2 days        Kathie Dike, MD

## 2019-04-18 NOTE — Progress Notes (Addendum)
Subjective:  Patient states her pain was worse last night, 9 out of 10.  Right now 8 out of 10.  Very uncomfortable.  States Tylenol is not helping.  Tolerating diet, "hard to tell if food makes my pain worse when I am already hurting so badly".  Having stronger urge to have a bowel movement but sometimes only passes yellow mucus.  For bowel movements throughout the night.  Stools with some pieces/balls but lots of liquid.  No vomiting.  Abdominal pain worse with sitting and movement.  Objective: Vital signs in last 24 hours: Temp:  [98 F (36.7 C)-98.1 F (36.7 C)] 98 F (36.7 C) (11/12 0535) Pulse Rate:  [71-84] 71 (11/12 0535) Resp:  [16-18] 16 (11/12 0535) BP: (153-163)/(68-75) 160/71 (11/12 0535) SpO2:  [97 %-100 %] 99 % (11/12 0535) Last BM Date: 04/17/19 General:   Alert,  Well-developed, well-nourished, appears uncomfortable. NAD Head:  Normocephalic and atraumatic. Eyes:  Sclera clear, no icterus.  Abdomen:  Soft, moderate tenderness throughout the lower abdomen but abdomen remains soft. No masses, hepatosplenomegaly or hernias noted. Normal bowel sounds, without guarding, and without rebound.   Extremities:  Without clubbing, deformity or edema. Neurologic:  Alert and  oriented x4;  grossly normal neurologically. Skin:  Intact without significant lesions or rashes. Psych:  Alert and cooperative. Normal mood and affect.  Intake/Output from previous day: 11/11 0701 - 11/12 0700 In: 2709.9 [P.O.:960; I.V.:1706.1; IV Piggyback:43.9] Out: -  Intake/Output this shift: No intake/output data recorded.  Lab Results: CBC Recent Labs    04/16/19 0440 04/17/19 0624 04/18/19 0514  WBC 7.1 12.2* 14.5*  HGB 10.1* 10.4* 10.6*  HCT 32.0* 32.7* 33.5*  MCV 92.5 92.9 92.0  PLT 257 257 260   BMET Recent Labs    04/16/19 0440 04/17/19 0624 04/18/19 0514  NA 139 138 140  K 4.3 4.5 4.0  CL 110 108 104  CO2 23 22 26   GLUCOSE 97 98 102*  BUN 22 9 8   CREATININE 0.95 0.93 0.88   CALCIUM 8.2* 8.8* 8.7*   LFTs Recent Labs    04/15/19 1911 04/16/19 0440  BILITOT 0.2* 0.5  ALKPHOS 78 63  AST 18 13*  ALT 14 12  PROT 7.2 5.7*  ALBUMIN 3.5 2.8*   Recent Labs    04/15/19 1911  LIPASE 32   PT/INR No results for input(s): LABPROT, INR in the last 72 hours.    Imaging Studies: Ct Abdomen Pelvis W Contrast  Result Date: 04/15/2019 CLINICAL DATA:  Abdominal pain, recent history of colitis EXAM: CT ABDOMEN AND PELVIS WITH CONTRAST TECHNIQUE: Multidetector CT imaging of the abdomen and pelvis was performed using the standard protocol following bolus administration of intravenous contrast. CONTRAST:  122m OMNIPAQUE IOHEXOL 300 MG/ML  SOLN COMPARISON:  March 11, 2019 FINDINGS: Lower chest: The visualized heart size within normal limits. No pericardial fluid/thickening. A small hiatal hernia is present. The visualized portions of the lungs are clear. Hepatobiliary: The liver is normal in density without focal abnormality.The main portal vein is patent. The patient is status post cholecystectomy. Mild dilatation of the extrahepatic biliary duct. Pancreas: Unremarkable. No pancreatic ductal dilatation or surrounding inflammatory changes. Spleen: Normal in size without focal abnormality. Adrenals/Urinary Tract: Both adrenal glands appear normal. The kidneys and collecting system appear normal without evidence of urinary tract calculus or hydronephrosis. Bladder is unremarkable. Stomach/Bowel: The stomach and small bowel are unremarkable. There is diffuse wall thickening seen within the cecum, sigmoid colon and rectum. There is mild  fat stranding changes seen around the sigmoid rectal junction. No pericolonic free fluid or free air is seen. No focal area of stenosis is seen. The appendix is unremarkable. Vascular/Lymphatic: There are no enlarged mesenteric, retroperitoneal, or pelvic lymph nodes. Scattered aortic atherosclerotic calcifications are seen without aneurysmal dilatation.  Reproductive: The patient is status post hysterectomy. No adnexal masses or collections seen. Other: No evidence of abdominal wall mass or hernia. Musculoskeletal: No acute or significant osseous findings. IMPRESSION: 1. Wall thickening with surrounding inflammatory changes involving the cecum, sigmoid colon, and rectum, consistent with colitis. This could be due to infectious or inflammatory process. Not thought to be due to ischemic colitis. Given patient the recent episode of colitis on March 11, 2019 would recommend direct visualization upon resolution of symptoms for underlying mass lesion. 2.  Aortic Atherosclerosis (ICD10-I70.0). Electronically Signed   By: Prudencio Pair M.D.   On: 04/15/2019 23:10  [2 weeks]   Assessment: 68 year old old female admitted November 9 with recurrent diarrhea, C. difficile colitis.  Admitted back in October for 5 days with pancolitis presumed infectious and treated for C. difficile due to risk factors or recent antibiotics (although C. difficile testing was negative) with vancomycin 125 mg 4 times daily for 10 days.  Patient reported clinical improvement and resolution of abdominal pain and improvement in stool consistency and frequency after treatment with vancomycin.  Symptoms returned on October 31.  This admission her C. difficile toxin and antigen both positive.  GI pathogen panel negative.  CT with wall thickening with surrounding inflammatory changes involving the cecum, sigmoid colon, rectum consistent with colitis.  Infectious versus inflammatory, less likely ischemic.  Anemia: Hemoglobin 10.9 on admission, up from 9.6 on discharge back in October.  She has had 3 episodes of tissue hematochezia last week.  No overt GI bleeding this admission.  Hemoglobin remained stable.  No evidence of iron deficiency based on October 20th labs.  Her B12 was at 175 low on October 6, she was started on vitamin B12 with noted improvement up to 771 on October 20.  Leukocytosis:  White blood cell count was normal on admission at 9600.  Yesterday was up to 12,200, today 14,500.  Plan: 1. Increase vancomycin to 500 mg 4 times daily (with plans for pulsed taper) due to ongoing abdominal pain, increase in WBC count. Monitor closely for progression of abdominal pain/distention/etc. Reassess in morning. If no signs of improvement, consider repeat CT. Discussed with Dr. Gala Romney.  2. Patient will need outpatient colonoscopy once acute illness has resolved assuming clinical improvement as an inpatient. 3. Avoid NSAIDs. 4. Hold PPI. 5. Single dose of dilaudid 0.58m IV. Discussed with Dr. MRoderic Palauwho will address ongoing pain management. Patient has allergy to codeine, itching but has tolerated dilaudid in the past.  Discussed with patient.  She will monitor for any signs or symptoms of itching, rash etc.  LLaureen Ochs LNila [facility]hewGastroenterology [facility] [id number]/202010:30 AM      LOS: 2 days

## 2019-04-19 DIAGNOSIS — K5100 Ulcerative (chronic) pancolitis without complications: Secondary | ICD-10-CM

## 2019-04-19 DIAGNOSIS — K219 Gastro-esophageal reflux disease without esophagitis: Secondary | ICD-10-CM

## 2019-04-19 DIAGNOSIS — E876 Hypokalemia: Secondary | ICD-10-CM

## 2019-04-19 DIAGNOSIS — K529 Noninfective gastroenteritis and colitis, unspecified: Secondary | ICD-10-CM

## 2019-04-19 LAB — BASIC METABOLIC PANEL
Anion gap: 6 (ref 5–15)
BUN: 10 mg/dL (ref 8–23)
CO2: 29 mmol/L (ref 22–32)
Calcium: 8.3 mg/dL — ABNORMAL LOW (ref 8.9–10.3)
Chloride: 104 mmol/L (ref 98–111)
Creatinine, Ser: 0.99 mg/dL (ref 0.44–1.00)
GFR calc Af Amer: >60 mL/min (ref >60)
GFR calc non Af Amer: 59 mL/min — ABNORMAL LOW (ref >60)
Glucose, Bld: 96 mg/dL (ref 70–99)
Potassium: 4 mmol/L (ref 3.5–5.1)
Sodium: 139 mmol/L (ref 135–145)

## 2019-04-19 LAB — CBC
HCT: 30.9 % — ABNORMAL LOW (ref 36.0–46.0)
Hemoglobin: 9.7 g/dL — ABNORMAL LOW (ref 12.0–15.0)
MCH: 29.2 pg (ref 26.0–34.0)
MCHC: 31.4 g/dL (ref 30.0–36.0)
MCV: 93.1 fL (ref 80.0–100.0)
Platelets: 234 10*3/uL (ref 150–400)
RBC: 3.32 MIL/uL — ABNORMAL LOW (ref 3.87–5.11)
RDW: 12.8 % (ref 11.5–15.5)
WBC: 14.4 10*3/uL — ABNORMAL HIGH (ref 4.0–10.5)
nRBC: 0 % (ref 0.0–0.2)

## 2019-04-19 NOTE — Care Management Important Message (Signed)
Important Message  Patient Details  Name: Rebecca Osborne MRN: [id number] Date of Birth: 01/28/1951   Medicare Important Message Given:  Yes     Tommy Medal 04/19/2019, 3:11 PM

## 2019-04-19 NOTE — Progress Notes (Signed)
PROGRESS NOTE    Rebecca Osborne  MBT:[id number] DOB: Nov 10, 1950 DOA: 04/15/2019 PCP: Susy Frizzle, MD    Brief Narrative:  68 year old female who presented with abdominal pain and diarrhea.  She has significant past medical history for seasonal allergies, cervical cancer, GERD and osteopenia.  She reported 5 to 6 weeks of persistent diarrhea and abdominal pain.  Recent hospitalization about 5 weeks ago for dehydration and abnormal electrolytes.  Her symptoms have been persistent despite oral vancomycin.  On her initial physical examination temperature 98, pulse rate 94, respiratory rate 12, blood pressure 123/73, oxygen saturation 100% on room air.  Her lungs are clear to auscultation bilaterally, heart S1-S2 present rhythm, abdomen with mild left lower quadrant abdominal pain, no lower extremity edema. CT of the abdomen with wall thickening with surrounding inflammatory changes involving the cecum, sigmoid colon and rectum, consistent with colitis.  Patient was admitted to the hospital working diagnosis of colitis.  She was admitted to the hospital, further work-up showed positive C. difficile and patient was started on oral vancomycin.  Gastroenterology was consulted.  Assessment & Plan:   Principal Problem:   C. difficile colitis Active Problems:   GERD   Pancolitis ([place])   Normocytic anemia   Hypokalemia   Colitis   1. Acute c diff colitis and diarrhea.  Continues to have loose stool.  WBC count appears to be stabilizing.  Abdominal pain is mildly better today.  Continue on oral vancomycin.  2. Hypokalemia and hypomagnesemia. Likely due to gastrointestinal losses, replaced.  3. GERD.  Protonix discontinued  4. Depression. Continue with duloxetine.  5.  Acute on chronic anemia, likely related to acute blood loss.  Patient does describe noticing some blood in her stools particularly when straining.  Hemoglobin decreased from 10.6 yesterday to 9.7 today.  No signs of active  bleeding at this time.  Continue to monitor hemoglobin.  DVT prophylaxis: enoxaparin   Code Status:  full Family Communication: no family at the bedside  Disposition Plan/ discharge barriers:  Pending clinical improvement.   Body mass index is 23.05 kg/m. Malnutrition Type:  Nutrition Problem: Inadequate oral intake Etiology: altered GI function, diarrhea(pancolitis;C diff)   Malnutrition Characteristics:  Signs/Symptoms: energy intake < 75% for > 7 days   Nutrition Interventions:  Interventions: MVI, Boost Breeze  RN Pressure Injury Documentation:     Consultants:   GI  Procedures:     Antimicrobials:   P.o. vancomycin   Subjective: Reports having 3-4 bowel movements today.  Had 2 bowel movements overnight.  No nausea or vomiting.  Continues to have some abdominal pain  Objective: Vitals:   04/18/19 1346 04/18/19 2049 04/18/19 2127 04/19/19 0636  BP: (!) 149/77  117/64 126/69  Pulse: 75  82 83  Resp: 17   16  Temp: 98.1 F (36.7 C)   97.7 F (36.5 C)  TempSrc: Oral   Oral  SpO2:  97% 98% 98%  Weight:      Height:        Intake/Output Summary (Last 24 hours) at 04/19/2019 1921 Last data filed at 04/19/2019 1525 Gross per 24 hour  Intake 1726.77 ml  Output -  Net 1726.77 ml   Filed Weights   04/15/19 1747  Weight: 55.3 kg    Examination:   General exam: Alert, awake, oriented x 3 Respiratory system: Clear to auscultation. Respiratory effort normal. Cardiovascular system:RRR. No murmurs, rubs, gallops. Gastrointestinal system: Abdomen is nondistended, soft and nontender. No organomegaly or masses felt. Normal  bowel sounds heard. Central nervous system: Alert and oriented. No focal neurological deficits. Extremities: No C/C/E, +pedal pulses Skin: No rashes, lesions or ulcers Psychiatry: Judgement and insight appear normal. Mood & affect appropriate.        Data Reviewed: I have personally reviewed following labs and imaging studies   CBC: Recent Labs  Lab 04/15/19 1911 04/16/19 0440 04/17/19 0624 04/18/19 0514 04/19/19 0452  WBC 9.6 7.1 12.2* 14.5* 14.4*  NEUTROABS 6.9 4.3  --  11.7*  --   HGB 10.9* 10.1* 10.4* 10.6* 9.7*  HCT 34.7* 32.0* 32.7* 33.5* 30.9*  MCV 92.8 92.5 92.9 92.0 93.1  PLT 328 257 257 260 829   Basic Metabolic Panel: Recent Labs  Lab 04/15/19 1911 04/16/19 0440 04/17/19 0624 04/18/19 0514 04/19/19 0452  NA 136 139 138 140 139  K 2.9* 4.3 4.5 4.0 4.0  CL 104 110 108 104 104  CO2 24 23 22 26 29   GLUCOSE 125* 97 98 102* 96  BUN 29* 22 9 8 10   CREATININE 1.20* 0.95 0.93 0.88 0.99  CALCIUM 8.4* 8.2* 8.8* 8.7* 8.3*  MG 1.9  --  1.5* 1.8  --   PHOS 4.6  --   --   --   --    GFR: Estimated Creatinine Clearance: 41 mL/min (by C-G formula based on SCr of 0.99 mg/dL). Liver Function Tests: Recent Labs  Lab 04/15/19 1911 04/16/19 0440  AST 18 13*  ALT 14 12  ALKPHOS 78 63  BILITOT 0.2* 0.5  PROT 7.2 5.7*  ALBUMIN 3.5 2.8*   Recent Labs  Lab 04/15/19 1911  LIPASE 32   No results for input(s): AMMONIA in the last 168 hours. Coagulation Profile: No results for input(s): INR, PROTIME in the last 168 hours. Cardiac Enzymes: No results for input(s): CKTOTAL, CKMB, CKMBINDEX, TROPONINI in the last 168 hours. BNP (last 3 results) No results for input(s): PROBNP in the last 8760 hours. HbA1C: No results for input(s): HGBA1C in the last 72 hours. CBG: No results for input(s): GLUCAP in the last 168 hours. Lipid Profile: No results for input(s): CHOL, HDL, LDLCALC, TRIG, CHOLHDL, LDLDIRECT in the last 72 hours. Thyroid Function Tests: No results for input(s): TSH, T4TOTAL, FREET4, T3FREE, THYROIDAB in the last 72 hours. Anemia Panel: No results for input(s): VITAMINB12, FOLATE, FERRITIN, TIBC, IRON, RETICCTPCT in the last 72 hours.    Radiology Studies: I have reviewed all of the imaging during this hospital visit personally     Scheduled Meds: . DULoxetine  30 mg Oral  Daily  . feeding supplement  1 Container Oral TID BM  . multivitamin with minerals  1 tablet Oral Daily  . saccharomyces boulardii  250 mg Oral BID  . vancomycin  500 mg Oral QID   Followed by  . [START ON 04/30/2019] vancomycin  125 mg Oral BID   Followed by  . [START ON 05/07/2019] vancomycin  125 mg Oral Daily   Followed by  . [START ON 05/14/2019] vancomycin  125 mg Oral QODAY   Followed by  . [START ON 05/22/2019] vancomycin  125 mg Oral Q3 days  . cyanocobalamin  1,000 mcg Oral Daily   Continuous Infusions: . lactated ringers 75 mL/hr at 04/19/19 1525     LOS: 3 days        Kathie Dike, MD

## 2019-04-19 NOTE — Plan of Care (Signed)
  Problem: Education: Goal: Knowledge of General Education information will improve Description Including pain rating scale, medication(s)/side effects and non-pharmacologic comfort measures Outcome: Progressing   Problem: Health Behavior/Discharge Planning: Goal: Ability to manage health-related needs will improve Outcome: Progressing   

## 2019-04-19 NOTE — Progress Notes (Signed)
Subjective: Feeling a little better today. Abdominal pain not as bad on oxycodone. Stools are "firming, now they're soft and no diarrhea." Does have straining when she feels the urge to have a bowel movement and can't make anything come out. Straining gives sense of rectal fullness with known hemorrhoids; straining also causes abdominal pain to worsen. Denies hematochezia or melena.  Objective: Vital signs in last 24 hours: Temp:  [97.7 F (36.5 C)-98.1 F (36.7 C)] 97.7 F (36.5 C) (11/13 0636) Pulse Rate:  [75-83] 83 (11/13 0636) Resp:  [16-17] 16 (11/13 0636) BP: (117-149)/(64-77) 126/69 (11/13 0636) SpO2:  [97 %-98 %] 98 % (11/13 0636) Last BM Date: 04/18/19 General:   Alert and oriented, pleasant Head:  Normocephalic and atraumatic. Eyes:  No icterus, sclera clear. Conjuctiva pink.  Heart:  S1, S2 present, no murmurs noted.  Lungs: Clear to auscultation bilaterally, without wheezing, rales, or rhonchi.  Abdomen:  Bowel sounds present, soft, non-distended. Noted mild to moderate abdominal TTP. No HSM or hernias noted. No rebound or guarding. No masses appreciated  Msk:  Symmetrical without gross deformities. Pulses:  Normal pulses noted. Extremities:  Without clubbing or edema. Neurologic:  Alert and  oriented x4;  grossly normal neurologically. Psych:  Alert and cooperative. Normal mood and affect.  Intake/Output from previous day: 11/12 0701 - 11/13 0700 In: 2290.4 [P.O.:480; I.V.:1810.4] Out: -  Intake/Output this shift: No intake/output data recorded.  Lab Results: Recent Labs    04/17/19 0624 04/18/19 0514 04/19/19 0452  WBC 12.2* 14.5* 14.4*  HGB 10.4* 10.6* 9.7*  HCT 32.7* 33.5* 30.9*  PLT 257 260 234   BMET Recent Labs    04/17/19 0624 04/18/19 0514 04/19/19 0452  NA 138 140 139  K 4.5 4.0 4.0  CL 108 104 104  CO2 22 26 29   GLUCOSE 98 102* 96  BUN 9 8 10   CREATININE 0.93 0.88 0.99  CALCIUM 8.8* 8.7* 8.3*   LFT No results for input(s):  PROT, ALBUMIN, AST, ALT, ALKPHOS, BILITOT, BILIDIR, IBILI in the last 72 hours. PT/INR No results for input(s): LABPROT, INR in the last 72 hours. Hepatitis Panel No results for input(s): HEPBSAG, HCVAB, HEPAIGM, HEPBIGM in the last 72 hours.   Studies/Results: No results found.  Assessment: 68 year old old female admitted November 9 with recurrent diarrhea, C. difficile colitis.  Admitted back in October for 5 days with pancolitis presumed infectious and treated for C. difficile due to risk factors or recent antibiotics (although C. difficile testing was negative) with vancomycin 125 mg 4 times daily for 10 days.  Patient reported clinical improvement and resolution of abdominal pain and improvement in stool consistency and frequency after treatment with vancomycin.  Symptoms returned on October 31. Upon admission her C. difficile toxin and antigen were both positive.  GI pathogen panel negative.  CT with wall thickening with surrounding inflammatory changes involving the cecum, sigmoid colon, rectum consistent with colitis.  Infectious versus inflammatory, less likely ischemic.  Anemia: Hemoglobin 10.9 on admission, up from 9.6 on discharge back in October.  She has had 3 episodes of tissue hematochezia last week but no overt GI bleeding this admission. No evidence of iron deficiency based on October 20th labs.  Her B12 was low at 175 on October 6, she was started on vitamin B12 with noted improvement up to 771 on October 20. Today her hgb has declined from 10.6 yesterday to 9.7 today. Denies hematochezia/melena; staff denies overt GI bleed.  Leukocytosis: White blood cell count  was normal on admission at 9600.  It has since increased to 12.2, 14.5 yesterday, and stable at 14.4 today with/despite increased dose of antibiotics.  C. Diff: Abdominal pain somewhat improved today. Does worsen when she strains with BM sensation. Stools solidifying. Seems to be turning the corner clinically.     Plan: 1. Continued Vancomycin 500 mg qid 2. Avoid straining (as discussed with the patient) 3. Hold off on further imaging of IV Flagyl for now; low threshold to initiate either if has re-worsening 4. Supportive measures 5. Monitor for any overt GI bleed 6. Recheck CBC in the morning 7. She will need pulse taper of vancomycin at discharge   Thank you for allowing [place] to participate in the care of [place], DNP, AGNP-C Adult & Gerontological Nurse Practitioner [facility]    LOS: 3 days    04/19/2019, 8:18 AM

## 2019-04-20 LAB — CBC
HCT: 29.2 % — ABNORMAL LOW (ref 36.0–46.0)
Hemoglobin: 9.1 g/dL — ABNORMAL LOW (ref 12.0–15.0)
MCH: 29.3 pg (ref 26.0–34.0)
MCHC: 31.2 g/dL (ref 30.0–36.0)
MCV: 93.9 fL (ref 80.0–100.0)
Platelets: 235 10*3/uL (ref 150–400)
RBC: 3.11 MIL/uL — ABNORMAL LOW (ref 3.87–5.11)
RDW: 13 % (ref 11.5–15.5)
WBC: 10.1 10*3/uL (ref 4.0–10.5)
nRBC: 0 % (ref 0.0–0.2)

## 2019-04-20 MED ORDER — CYANOCOBALAMIN 1000 MCG PO TABS: 1000.0000 ug | ORAL_TABLET | Freq: Every day | ORAL | 0 refills | Status: AC

## 2019-04-20 MED ORDER — SACCHAROMYCES BOULARDII 250 MG PO CAPS: 250.0000 mg | ORAL_CAPSULE | Freq: Two times a day (BID) | ORAL | 0 refills | Status: DC

## 2019-04-20 MED ORDER — VANCOMYCIN 50 MG/ML ORAL SOLUTION: ORAL | 0 refills | Status: DC

## 2019-04-20 NOTE — Plan of Care (Signed)

## 2019-04-20 NOTE — Plan of Care (Signed)
  Problem: Education: Goal: Knowledge of General Education information will improve Description: Including pain rating scale, medication(s)/side effects and non-pharmacologic comfort measures 04/20/2019 1622 by Santa Lighter, RN Outcome: Adequate for Discharge 04/20/2019 0913 by Santa Lighter, RN Outcome: Progressing   Problem: Health Behavior/Discharge Planning: Goal: Ability to manage health-related needs will improve 04/20/2019 1622 by Santa Lighter, RN Outcome: Adequate for Discharge 04/20/2019 0913 by Santa Lighter, RN Outcome: Progressing   Problem: Clinical Measurements: Goal: Ability to maintain clinical measurements within normal limits will improve 04/20/2019 1622 by Santa Lighter, RN Outcome: Adequate for Discharge 04/20/2019 0913 by Santa Lighter, RN Outcome: Progressing Goal: Will remain free from infection Outcome: Adequate for Discharge Goal: Diagnostic test results will improve Outcome: Adequate for Discharge Goal: Respiratory complications will improve Outcome: Adequate for Discharge Goal: Cardiovascular complication will be avoided Outcome: Adequate for Discharge   Problem: Activity: Goal: Risk for activity intolerance will decrease Outcome: Adequate for Discharge   Problem: Nutrition: Goal: Adequate nutrition will be maintained Outcome: Adequate for Discharge   Problem: Coping: Goal: Level of anxiety will decrease Outcome: Adequate for Discharge   Problem: Elimination: Goal: Will not experience complications related to bowel motility Outcome: Adequate for Discharge Goal: Will not experience complications related to urinary retention Outcome: Adequate for Discharge   Problem: Pain Managment: Goal: General experience of comfort will improve Outcome: Adequate for Discharge   Problem: Safety: Goal: Ability to remain free from injury will improve Outcome: Adequate for Discharge   Problem: Skin Integrity: Goal: Risk for impaired skin  integrity will decrease Outcome: Adequate for Discharge

## 2019-04-20 NOTE — Progress Notes (Signed)
Nsg Discharge Note  Admit Date:  04/15/2019 Discharge date: 04/20/2019   [name] to be D/C'd Home per MD order.  AVS completed.  Copy for chart, and copy for patient signed, and dated. Reviewed d/c instructions with patient. Answered all questions. Gave preprinted vanc prescription to patient. Tim walked patient to ED so she could drive herself home.  Patient/caregiver able to verbalize understanding.  Discharge Medication: Allergies as of 04/20/2019      Reactions   Codeine Itching      Medication List    STOP taking these medications   pantoprazole 40 MG tablet Commonly known as: PROTONIX     TAKE these medications   cyanocobalamin 1000 MCG tablet Take 1 tablet (1,000 mcg total) by mouth daily.   DULoxetine 30 MG capsule Commonly known as: CYMBALTA TAKE 1 CAPSULE BY MOUTH EVERY DAY What changed: how much to take   saccharomyces boulardii 250 MG capsule Commonly known as: Florastor Take 1 capsule (250 mg total) by mouth 2 (two) times daily.   vancomycin 50 mg/mL  oral solution Commonly known as: VANCOCIN Take 524m po qid for 10 days then 5055mpo bid for 10 days What changed:   medication strength  how much to take  how to take this  when to take this  additional instructions   Vitamin D (Ergocalciferol) 1.25 MG (50000 UT) Caps capsule Commonly known as: DRISDOL Take 1 capsule (50,000 Units total) by mouth every 7 (seven) days.       Discharge Assessment: Vitals:   04/19/19 2202 04/20/19 0445  BP: (!) 112/57 (!) 108/49  Pulse: 85 75  Resp:    Temp:    SpO2: 99% 98%   Skin clean, dry and intact without evidence of skin break down, no evidence of skin tears noted. IV catheter discontinued intact. Site without signs and symptoms of complications - no redness or edema noted at insertion site, patient denies c/o pain - only slight tenderness at site.  Dressing with slight pressure applied.  D/c Instructions-Education: Discharge instructions given  to patient/family with verbalized understanding. D/c education completed with patient/family including follow up instructions, medication list, d/c activities limitations if indicated, with other d/c instructions as indicated by MD - patient able to verbalize understanding, all questions fully answered. Patient instructed to return to ED, call 911, or call MD for any changes in condition.  [place].  ShSanta LighterRN 04/20/2019 6:00 PM

## 2019-04-20 NOTE — Discharge Summary (Signed)
Physician Discharge Summary  Rebecca Osborne HFW:[id number] DOB: 1950-09-13 DOA: 04/15/2019  PCP: Susy Frizzle, MD  Admit date: 04/15/2019 Discharge date: 04/20/2019  Admitted From: Home Disposition: Home  Recommendations for Outpatient Follow-up:  1. Follow up with PCP in 1-2 weeks 2. Please obtain BMP/CBC in one week 3. Follow-up with gastroenterology as scheduled  Discharge Condition: Stable CODE STATUS: Full code Diet recommendation: Heart healthy  Brief/Interim Summary: 68 year old female who presented with abdominal pain and diarrhea.  She has significant past medical history for seasonal allergies, cervical cancer, GERD and osteopenia.  She reported 5 to 6 weeks of persistent diarrhea and abdominal pain.  Recent hospitalization about 5 weeks ago for dehydration and abnormal electrolytes.  Her symptoms have been persistent despite oral vancomycin.  On her initial physical examination temperature 98, pulse rate 94, respiratory rate 12, blood pressure 123/73, oxygen saturation 100% on room air.  Her lungs are clear to auscultation bilaterally, heart S1-S2 present rhythm, abdomen with mild left lower quadrant abdominal pain, no lower extremity edema. CT of the abdomen with wall thickening with surrounding inflammatory changes involving the cecum, sigmoid colon and rectum, consistent with colitis.  Patient was admitted to the hospital working diagnosis of colitis.  She was admitted to the hospital, further work-up showed positive C. difficile and patient was started on oral vancomycin.  Gastroenterology was consulted.  Discharge Diagnoses:  Principal Problem:   C. difficile colitis Active Problems:   GERD   Pancolitis ([place])   Normocytic anemia   Hypokalemia   Colitis  1. C. difficile colitis.  Patient was treated with oral vancomycin.  Initially, her symptoms persisted WBC count began to trend up.  When vancomycin dose was increased to 500 mg 4 times daily, her symptoms began  to improve.  WBC count has trended back down to normal.  Overall diarrhea frequency has improved significantly to twice a day.  Stools are more formed.  Discussed further treatment course with GI, Dr. Melony Overly who recommended that patient be treated with vancomycin 500 mg 4 times daily for 10 days followed by vancomycin 500 mg twice daily for 10 days.  Patient's PPI has been discontinued.  She is continued on Florastor.  Her electrolytes are currently normal.  Remainder of her medical problems remained stable.  Discharge Instructions  Discharge Instructions    Diet - low sodium heart healthy   Complete by: As directed    Increase activity slowly   Complete by: As directed      Allergies as of 04/20/2019      Reactions   Codeine Itching      Medication List    STOP taking these medications   pantoprazole 40 MG tablet Commonly known as: PROTONIX     TAKE these medications   cyanocobalamin 1000 MCG tablet Take 1 tablet (1,000 mcg total) by mouth daily.   DULoxetine 30 MG capsule Commonly known as: CYMBALTA TAKE 1 CAPSULE BY MOUTH EVERY DAY What changed: how much to take   saccharomyces boulardii 250 MG capsule Commonly known as: Florastor Take 1 capsule (250 mg total) by mouth 2 (two) times daily.   vancomycin 50 mg/mL  oral solution Commonly known as: VANCOCIN Take 537m po qid for 10 days then 5060mpo bid for 10 days What changed:   medication strength  how much to take  how to take this  when to take this  additional instructions   Vitamin D (Ergocalciferol) 1.25 MG (50000 UT) Caps capsule Commonly known as: DRISDOL  Take 1 capsule (50,000 Units total) by mouth every 7 (seven) days.      Follow-up Information    Fields, Marga Melnick, MD Follow up.   Specialty: Gastroenterology Why: office will call you with an appointment Contact information: [address] [place] [address] [phone]          Allergies  Allergen Reactions  . Codeine Itching     Consultations:  Gastroenterology   Procedures/Studies: Ct Abdomen Pelvis W Contrast  Result Date: 04/15/2019 CLINICAL DATA:  Abdominal pain, recent history of colitis EXAM: CT ABDOMEN AND PELVIS WITH CONTRAST TECHNIQUE: Multidetector CT imaging of the abdomen and pelvis was performed using the standard protocol following bolus administration of intravenous contrast. CONTRAST:  187m OMNIPAQUE IOHEXOL 300 MG/ML  SOLN COMPARISON:  March 11, 2019 FINDINGS: Lower chest: The visualized heart size within normal limits. No pericardial fluid/thickening. A small hiatal hernia is present. The visualized portions of the lungs are clear. Hepatobiliary: The liver is normal in density without focal abnormality.The main portal vein is patent. The patient is status post cholecystectomy. Mild dilatation of the extrahepatic biliary duct. Pancreas: Unremarkable. No pancreatic ductal dilatation or surrounding inflammatory changes. Spleen: Normal in size without focal abnormality. Adrenals/Urinary Tract: Both adrenal glands appear normal. The kidneys and collecting system appear normal without evidence of urinary tract calculus or hydronephrosis. Bladder is unremarkable. Stomach/Bowel: The stomach and small bowel are unremarkable. There is diffuse wall thickening seen within the cecum, sigmoid colon and rectum. There is mild fat stranding changes seen around the sigmoid rectal junction. No pericolonic free fluid or free air is seen. No focal area of stenosis is seen. The appendix is unremarkable. Vascular/Lymphatic: There are no enlarged mesenteric, retroperitoneal, or pelvic lymph nodes. Scattered aortic atherosclerotic calcifications are seen without aneurysmal dilatation. Reproductive: The patient is status post hysterectomy. No adnexal masses or collections seen. Other: No evidence of abdominal wall mass or hernia. Musculoskeletal: No acute or significant osseous findings. IMPRESSION: 1. Wall thickening with  surrounding inflammatory changes involving the cecum, sigmoid colon, and rectum, consistent with colitis. This could be due to infectious or inflammatory process. Not thought to be due to ischemic colitis. Given patient the recent episode of colitis on March 11, 2019 would recommend direct visualization upon resolution of symptoms for underlying mass lesion. 2.  Aortic Atherosclerosis (ICD10-I70.0). Electronically Signed   By: BPrudencio PairM.D.   On: 04/15/2019 23:10       Subjective: Feeling better.  Abdominal pain is improving.  Tolerating solid diet.  Only had one bowel movement today which was more formed.  Discharge Exam: Vitals:   04/18/19 2127 04/19/19 0636 04/19/19 2202 04/20/19 0445  BP: 117/64 126/69 (!) 112/57 (!) 108/49  Pulse: 82 83 85 75  Resp:  16    Temp:  97.7 F (36.5 C)    TempSrc:  Oral    SpO2: 98% 98% 99% 98%  Weight:      Height:        General: Pt is alert, awake, not in acute distress Cardiovascular: RRR, S1/S2 +, no rubs, no gallops Respiratory: CTA bilaterally, no wheezing, no rhonchi Abdominal: Soft, NT, ND, bowel sounds + Extremities: no edema, no cyanosis    The results of significant diagnostics from this hospitalization (including imaging, microbiology, ancillary and laboratory) are listed below for reference.     Microbiology: Recent Results (from the past 240 hour(s))  SARS CORONAVIRUS 2 (TAT 6-24 HRS) Nasopharyngeal Nasopharyngeal Swab     Status: None  Collection Time: 04/16/19 12:05 AM   Specimen: Nasopharyngeal Swab  Result Value Ref Range Status   SARS Coronavirus 2 NEGATIVE NEGATIVE Final    Comment: (NOTE) SARS-CoV-2 target nucleic acids are NOT DETECTED. The SARS-CoV-2 RNA is generally detectable in upper and lower respiratory specimens during the acute phase of infection. Negative results do not preclude SARS-CoV-2 infection, do not rule out co-infections with other pathogens, and should not be used as the sole basis for  treatment or other patient management decisions. Negative results must be combined with clinical observations, patient history, and epidemiological information. The expected result is Negative. Fact Sheet for Patients: SugarRoll.be Fact Sheet for Healthcare Providers: https://www.woods-mathews.com/ This test is not yet approved or cleared by the [place] FDA and  has been authorized for detection and/or diagnosis of SARS-CoV-2 by FDA under an Emergency Use Authorization (EUA). This EUA will remain  in effect (meaning this test can be used) for the duration of the COVID-19 declaration under Section 56 4(b)(1) of the Act, 21 U.S.C. section 360bbb-3(b)(1), unless the authorization is terminated or revoked sooner. Performed at [facility], [place] [address] [place], [place] [address]   Gastrointestinal Panel by PCR , Stool     Status: None   Collection Time: 04/16/19 12:06 AM   Specimen: STOOL  Result Value Ref Range Status   Campylobacter species NOT DETECTED NOT DETECTED Final   Plesimonas shigelloides NOT DETECTED NOT DETECTED Final   Salmonella species NOT DETECTED NOT DETECTED Final   Yersinia enterocolitica NOT DETECTED NOT DETECTED Final   Vibrio species NOT DETECTED NOT DETECTED Final   Vibrio cholerae NOT DETECTED NOT DETECTED Final   Enteroaggregative E coli (EAEC) NOT DETECTED NOT DETECTED Final   Enteropathogenic E coli (EPEC) NOT DETECTED NOT DETECTED Final   Enterotoxigenic E coli (ETEC) NOT DETECTED NOT DETECTED Final   Shiga like toxin producing E coli (STEC) NOT DETECTED NOT DETECTED Final   Shigella/Enteroinvasive E coli (EIEC) NOT DETECTED NOT DETECTED Final   Cryptosporidium NOT DETECTED NOT DETECTED Final   Cyclospora cayetanensis NOT DETECTED NOT DETECTED Final   Entamoeba histolytica NOT DETECTED NOT DETECTED Final   Giardia lamblia NOT DETECTED NOT DETECTED Final   Adenovirus F40/41 NOT DETECTED NOT DETECTED  Final   Astrovirus NOT DETECTED NOT DETECTED Final   Norovirus GI/GII NOT DETECTED NOT DETECTED Final   Rotavirus A NOT DETECTED NOT DETECTED Final   Sapovirus (I, II, IV, and V) NOT DETECTED NOT DETECTED Final    Comment: Performed at [facility], [address] [place], [place] [address]  C Difficile Quick Screen w PCR reflex     Status: Abnormal   Collection Time: 04/16/19 12:06 AM   Specimen: STOOL  Result Value Ref Range Status   C Diff antigen POSITIVE (A) NEGATIVE Final   C Diff toxin POSITIVE (A) NEGATIVE Final   C Diff interpretation Toxin producing C. difficile detected.  Final    Comment: CRITICAL RESULT CALLED TO, READ BACK BY AND VERIFIED WITH: K NICHOLS,RN @0209  04/16/19 [facility]LLY Performed at [facility], [address] [place], [place] [address]      Labs: BNP (last 3 results) No results for input(s): BNP in the last 8760 hours. Basic Metabolic Panel: Recent Labs  Lab 04/15/19 1911 04/16/19 0440 04/17/19 0624 04/18/19 0514 04/19/19 0452  NA 136 139 138 140 139  K 2.9* 4.3 4.5 4.0 4.0  CL 104 110 108 104 104  CO2 24 23 22 26 29   GLUCOSE  125* 97 98 102* 96  BUN 29* 22 9 8 10   CREATININE 1.20* 0.95 0.93 0.88 0.99  CALCIUM 8.4* 8.2* 8.8* 8.7* 8.3*  MG 1.9  --  1.5* 1.8  --   PHOS 4.6  --   --   --   --    Liver Function Tests: Recent Labs  Lab 04/15/19 1911 04/16/19 0440  AST 18 13*  ALT 14 12  ALKPHOS 78 63  BILITOT 0.2* 0.5  PROT 7.2 5.7*  ALBUMIN 3.5 2.8*   Recent Labs  Lab 04/15/19 1911  LIPASE 32   No results for input(s): AMMONIA in the last 168 hours. CBC: Recent Labs  Lab 04/15/19 1911 04/16/19 0440 04/17/19 0624 04/18/19 0514 04/19/19 0452 04/20/19 0629  WBC 9.6 7.1 12.2* 14.5* 14.4* 10.1  NEUTROABS 6.9 4.3  --  11.7*  --   --   HGB 10.9* 10.1* 10.4* 10.6* 9.7* 9.1*  HCT 34.7* 32.0* 32.7* 33.5* 30.9* 29.2*  MCV 92.8 92.5 92.9 92.0 93.1 93.9  PLT 328 257 257 260 234 235   Cardiac Enzymes: No results for  input(s): CKTOTAL, CKMB, CKMBINDEX, TROPONINI in the last 168 hours. BNP: Invalid input(s): POCBNP CBG: No results for input(s): GLUCAP in the last 168 hours. D-Dimer No results for input(s): DDIMER in the last 72 hours. Hgb A1c No results for input(s): HGBA1C in the last 72 hours. Lipid Profile No results for input(s): CHOL, HDL, LDLCALC, TRIG, CHOLHDL, LDLDIRECT in the last 72 hours. Thyroid function studies No results for input(s): TSH, T4TOTAL, T3FREE, THYROIDAB in the last 72 hours.  Invalid input(s): FREET3 Anemia work up No results for input(s): VITAMINB12, FOLATE, FERRITIN, TIBC, IRON, RETICCTPCT in the last 72 hours. Urinalysis    Component Value Date/Time   COLORURINE COLORLESS (A) 04/16/2019 0040   APPEARANCEUR CLEAR 04/16/2019 0040   LABSPEC 1.014 04/16/2019 0040   PHURINE 6.0 04/16/2019 0040   GLUCOSEU NEGATIVE 04/16/2019 0040   HGBUR NEGATIVE 04/16/2019 0040   BILIRUBINUR NEGATIVE 04/16/2019 0040   KETONESUR NEGATIVE 04/16/2019 0040   PROTEINUR NEGATIVE 04/16/2019 0040   UROBILINOGEN 0.2 02/21/2019 1940   NITRITE NEGATIVE 04/16/2019 0040   LEUKOCYTESUR SMALL (A) 04/16/2019 0040   Sepsis Labs Invalid input(s): PROCALCITONIN,  WBC,  LACTICIDVEN Microbiology Recent Results (from the past 240 hour(s))  SARS CORONAVIRUS 2 (TAT 6-24 HRS) Nasopharyngeal Nasopharyngeal Swab     Status: None   Collection Time: 04/16/19 12:05 AM   Specimen: Nasopharyngeal Swab  Result Value Ref Range Status   SARS Coronavirus 2 NEGATIVE NEGATIVE Final    Comment: (NOTE) SARS-CoV-2 target nucleic acids are NOT DETECTED. The SARS-CoV-2 RNA is generally detectable in upper and lower respiratory specimens during the acute phase of infection. Negative results do not preclude SARS-CoV-2 infection, do not rule out co-infections with other pathogens, and should not be used as the sole basis for treatment or other patient management decisions. Negative results must be combined with  clinical observations, patient history, and epidemiological information. The expected result is Negative. Fact Sheet for Patients: SugarRoll.be Fact Sheet for Healthcare Providers: https://www.woods-mathews.com/ This test is not yet approved or cleared by the [place] FDA and  has been authorized for detection and/or diagnosis of SARS-CoV-2 by FDA under an Emergency Use Authorization (EUA). This EUA will remain  in effect (meaning this test can be used) for the duration of the COVID-19 declaration under Section 56 4(b)(1) of the Act, 21 U.S.C. section 360bbb-3(b)(1), unless the authorization is terminated or revoked sooner. Performed at  [facility], [place] [address], [place], [place] [address]   Gastrointestinal Panel by PCR , Stool     Status: None   Collection Time: 04/16/19 12:06 AM   Specimen: STOOL  Result Value Ref Range Status   Campylobacter species NOT DETECTED NOT DETECTED Final   Plesimonas shigelloides NOT DETECTED NOT DETECTED Final   Salmonella species NOT DETECTED NOT DETECTED Final   Yersinia enterocolitica NOT DETECTED NOT DETECTED Final   Vibrio species NOT DETECTED NOT DETECTED Final   Vibrio cholerae NOT DETECTED NOT DETECTED Final   Enteroaggregative E coli (EAEC) NOT DETECTED NOT DETECTED Final   Enteropathogenic E coli (EPEC) NOT DETECTED NOT DETECTED Final   Enterotoxigenic E coli (ETEC) NOT DETECTED NOT DETECTED Final   Shiga like toxin producing E coli (STEC) NOT DETECTED NOT DETECTED Final   Shigella/Enteroinvasive E coli (EIEC) NOT DETECTED NOT DETECTED Final   Cryptosporidium NOT DETECTED NOT DETECTED Final   Cyclospora cayetanensis NOT DETECTED NOT DETECTED Final   Entamoeba histolytica NOT DETECTED NOT DETECTED Final   Giardia lamblia NOT DETECTED NOT DETECTED Final   Adenovirus F40/41 NOT DETECTED NOT DETECTED Final   Astrovirus NOT DETECTED NOT DETECTED Final   Norovirus GI/GII NOT DETECTED NOT  DETECTED Final   Rotavirus A NOT DETECTED NOT DETECTED Final   Sapovirus (I, II, IV, and V) NOT DETECTED NOT DETECTED Final    Comment: Performed at [facility], [place] [place], [place] [address]  C Difficile Quick Screen w PCR reflex     Status: Abnormal   Collection Time: 04/16/19 12:06 AM   Specimen: STOOL  Result Value Ref Range Status   C Diff antigen POSITIVE (A) NEGATIVE Final   C Diff toxin POSITIVE (A) NEGATIVE Final   C Diff interpretation Toxin producing C. difficile detected.  Final    Comment: CRITICAL RESULT CALLED TO, READ BACK BY AND VERIFIED WITH: K NICHOLS,RN @0209  04/16/19 [facility] Performed at [facility], [address] [place],  [address]      Time coordinating discharge: 76mns  SIGNED:   JKathie Dike MD  [facility] 04/20/2019, 7:00 PM   If 7PM-7AM, please contact night-coverage [email or web address]

## 2019-04-24 ENCOUNTER — Other Ambulatory Visit: Payer: Self-pay | Admitting: *Deleted

## 2019-04-24 NOTE — Patient Outreach (Signed)
Red EMMI flag general discharge received for 04/23/19 Day #1, Scheduled follow up? No- Outreach call to pt to address, spoke with pt who reports she has not made the appointment but plans to today and is able to do this for herself.  No other concerns voiced.  Jacqlyn Larsen [facility], [place] Coordinator [phone]

## 2019-04-25 DIAGNOSIS — M25571 Pain in right ankle and joints of right foot: Secondary | ICD-10-CM | POA: Diagnosis not present

## 2019-04-25 DIAGNOSIS — M25572 Pain in left ankle and joints of left foot: Secondary | ICD-10-CM | POA: Diagnosis not present

## 2019-04-29 ENCOUNTER — Ambulatory Visit (INDEPENDENT_AMBULATORY_CARE_PROVIDER_SITE_OTHER): Payer: Medicare Other | Admitting: Gastroenterology

## 2019-04-29 ENCOUNTER — Other Ambulatory Visit: Payer: Self-pay

## 2019-04-29 ENCOUNTER — Other Ambulatory Visit: Payer: Self-pay | Admitting: *Deleted

## 2019-04-29 ENCOUNTER — Encounter: Payer: Self-pay | Admitting: Gastroenterology

## 2019-04-29 VITALS — BP 114/66 | HR 71 | Temp 96.9°F | Ht 61.0 in | Wt 112.4 lb

## 2019-04-29 DIAGNOSIS — D649 Anemia, unspecified: Secondary | ICD-10-CM

## 2019-04-29 DIAGNOSIS — A0472 Enterocolitis due to Clostridium difficile, not specified as recurrent: Secondary | ICD-10-CM

## 2019-04-29 DIAGNOSIS — K5100 Ulcerative (chronic) pancolitis without complications: Secondary | ICD-10-CM | POA: Diagnosis not present

## 2019-04-29 NOTE — Patient Instructions (Addendum)
1. Hold Magnesium for now. 2. Go for labs at [facility]. We will contact you with results as available.  3. Colonoscopy in Feb. See separate instructions.  4. Complete vancomycin as prescribed.  5. Call if persistent diarrhea.  6. Change out your toothbrush, use bleach type cleaning products to clean your bathroom.

## 2019-04-29 NOTE — Progress Notes (Addendum)
REVIEWED-NO ADDITIONAL RECOMMENDATIONS.  Primary Care Physician: Susy Frizzle, MD  Primary Gastroenterologist:  Barney Drain, MD   Chief Complaint  Patient presents with   hfu colitis    reports diarrhea yesterday    HPI: Rebecca Osborne is a 68 y.o. female here for hospital follow up. She has been out of hospital for 9 days. She was initially admitted 03/2019 for 5 days with pancolitis presumed infectious and treated for Cdiff due to risk factors of recent antibiotics (although C. difficile testing was negative) with vancomycin 125 mg 4 times daily for 10 days. Patient reported clinical improvement and resolution of abdominal pain and improvement in stool consistency and frequency after treatment with vancomycin. Symptoms returned on October 31. Upon admission her C. difficile toxin and antigen were both positive. GI pathogen panel negative. CT with wall thickening with surrounding inflammatory changes involving the cecum, sigmoid colon, rectum consistent with colitis. Infectious versus inflammatory, less likely ischemic.  She had somewhat protracted hospitalization due to slow improvement in persistent abdominal pain.  Ultimately required increasing vancomycin to 500 mg every 6 hours.  Currently she is on same dose, on Thursday she is going to drop down to 500 mg every 12 hours.  Weight is down about 10 pounds since a few weeks ago. Stools were back to normal.  Abdominal pain resolved.  Prior to yesterday she had been 3 days without a bowel movement. Yesterday several episodes of loose stools, fecal incontinence. No BMs since went to bed last night.  Notes that she started back on magnesium couple of days ago.  She wonders if this contributed to her diarrhea.  For summary did not have magnesium on her med list.  She is not sure why she started back on it.  She has not seen her PCP since she was in the hospital. She denies any upper GI symptoms.  She worries about sensation of  fullness in the rectum, significant involuntary straining when she has diarrhea.    Current Outpatient Medications  Medication Sig Dispense Refill   cyanocobalamin 1000 MCG tablet Take 1 tablet (1,000 mcg total) by mouth daily. 30 tablet 0   DULoxetine (CYMBALTA) 30 MG capsule TAKE 1 CAPSULE BY MOUTH EVERY DAY (Patient taking differently: Take 30 mg by mouth daily. ) 90 capsule 0   Magnesium 250 MG TABS Take 1 tablet by mouth daily.     saccharomyces boulardii (FLORASTOR) 250 MG capsule Take 1 capsule (250 mg total) by mouth 2 (two) times daily. 60 capsule 0   vancomycin (VANCOCIN) 50 mg/mL oral solution Take 568m po qid for 10 days then 5058mpo bid for 10 days 600 mL 0   No current facility-administered medications for this visit.     Allergies as of 04/29/2019 - Review Complete 04/15/2019  Allergen Reaction Noted   Codeine Itching    Past Medical History:  Diagnosis Date   Allergy    Cancer ([facility]   cervical cancer cells   GERD (gastroesophageal reflux disease)    Osteopenia    Past Surgical History:  Procedure Laterality Date   ABDOMINAL HYSTERECTOMY     BOWEL RESECTION N/A 06/22/2016   Procedure: SMALL BOWEL RESECTION;  Surgeon: JaVickie EpleyMD;  Location: [facility];  Service: General;  Laterality: N/A;   CHOLECYSTECTOMY  2005   COLONOSCOPY WITH ESOPHAGOGASTRODUODENOSCOPY (EGD)  09/2009   Fields: Normal colonoscopy.  H. pylori gastritis.  Schatzki ring status post esophageal dilation.  Next colonoscopy 10  years   FRACTURE SURGERY     rt foot   LAPAROTOMY N/A 06/22/2016   Procedure: EXPLORATORY LAPAROTOMY;  Surgeon: Vickie Epley, MD;  Location: [facility];  Service: General;  Laterality: N/A;   LYSIS OF ADHESION N/A 06/22/2016   Procedure: LYSIS OF ADHESION;  Surgeon: Vickie Epley, MD;  Location: [facility];  Service: General;  Laterality: N/A;   Family History  Problem Relation Age of Onset   COPD Mother    Heart disease Mother    Miscarriages /  [place] Mother    Vision loss Mother        from shingles   COPD Father    Cancer Brother        leukemia   Early death Brother    Colon cancer Neg Hx    Colon polyps Neg Hx    Inflammatory bowel disease Neg Hx    Social History   Socioeconomic History   Marital status: Widowed    Spouse name: Not on file   Number of children: Not on file   Years of education: Not on file   Highest education level: Not on file  Occupational History   Not on file  Social Needs   Financial resource strain: Not on file   Food insecurity    Worry: Not on file    Inability: Not on file   Transportation needs    Medical: Not on file    Non-medical: Not on file  Tobacco Use   Smoking status: Never Smoker   Smokeless tobacco: Never Used  Substance and Sexual Activity   Alcohol use: No   Drug use: No   Sexual activity: Not Currently  Lifestyle   Physical activity    Days per week: Not on file    Minutes per session: Not on file   Stress: Not on file  Relationships   Social connections    Talks on phone: Not on file    Gets together: Not on file    Attends religious service: Not on file    Active member of club or organization: Not on file    Attends meetings of clubs or organizations: Not on file    Relationship status: Not on file   Intimate partner violence    Fear of current or ex partner: Not on file    Emotionally abused: Not on file    Physically abused: Not on file    Forced sexual activity: Not on file  Other Topics Concern   Not on file  Social History Narrative   Not on file    ROS:  General: Negative for anorexia, fever, chills, fatigue, weakness.  See HPI ENT: Negative for hoarseness, difficulty swallowing , nasal congestion. CV: Negative for chest pain, angina, palpitations, dyspnea on exertion, peripheral edema.  Respiratory: Negative for dyspnea at rest, dyspnea on exertion, cough, sputum, wheezing.  GI: See history of present  illness. GU:  Negative for dysuria, hematuria, urinary incontinence, urinary frequency, nocturnal urination.  Endo: Negative for unusual weight change.  See HPI   Physical Examination:   BP 114/66    Pulse 71    Temp (!) 96.9 F (36.1 C) (Oral)    Ht 5' 1"  (1.549 m)    Wt 112 lb 6.4 oz (51 kg)    BMI 21.24 kg/m   General: Well-nourished, well-developed in no acute distress.  Eyes: No icterus. Mouth: Oropharyngeal mucosa moist and pink , no lesions erythema or exudate. Lungs: Clear to auscultation  bilaterally.  Heart: Regular rate and rhythm, no murmurs rubs or gallops.  Abdomen: Bowel sounds are normal, nontender, nondistended, no hepatosplenomegaly or masses, no abdominal bruits or hernia , no rebound or guarding.   Rectal: No abnormalities noted externally.  Good sphincter tone.  No evidence of prolapsing with bearing down.  No masses in the rectal vault.  No stool present.  No tenderness. Extremities: No lower extremity edema. No clubbing or deformities. Neuro: Alert and oriented x 4   Skin: Warm and dry, no jaundice.   Psych: Alert and cooperative, normal mood and affect.  Labs:  Lab Results  Component Value Date   CREATININE 0.99 04/19/2019   BUN 10 04/19/2019   NA 139 04/19/2019   K 4.0 04/19/2019   CL 104 04/19/2019   CO2 29 04/19/2019   Lab Results  Component Value Date   ALT 12 04/16/2019   AST 13 (L) 04/16/2019   ALKPHOS 63 04/16/2019   BILITOT 0.5 04/16/2019   Lab Results  Component Value Date   WBC 10.1 04/20/2019   HGB 9.1 (L) 04/20/2019   HCT 29.2 (L) 04/20/2019   MCV 93.9 04/20/2019   PLT 235 04/20/2019   Lab Results  Component Value Date   IRON 64 03/26/2019   TIBC 232 (L) 03/12/2019   FERRITIN 63 03/12/2019   Lab Results  Component Value Date   OEUMPNTI14 431 03/26/2019   Lab Results  Component Value Date   FOLATE 15.2 03/12/2019    Imaging Studies: Ct Abdomen Pelvis W Contrast  Result Date: 04/15/2019 CLINICAL DATA:  Abdominal pain,  recent history of colitis EXAM: CT ABDOMEN AND PELVIS WITH CONTRAST TECHNIQUE: Multidetector CT imaging of the abdomen and pelvis was performed using the standard protocol following bolus administration of intravenous contrast. CONTRAST:  132m OMNIPAQUE IOHEXOL 300 MG/ML  SOLN COMPARISON:  March 11, 2019 FINDINGS: Lower chest: The visualized heart size within normal limits. No pericardial fluid/thickening. A small hiatal hernia is present. The visualized portions of the lungs are clear. Hepatobiliary: The liver is normal in density without focal abnormality.The main portal vein is patent. The patient is status post cholecystectomy. Mild dilatation of the extrahepatic biliary duct. Pancreas: Unremarkable. No pancreatic ductal dilatation or surrounding inflammatory changes. Spleen: Normal in size without focal abnormality. Adrenals/Urinary Tract: Both adrenal glands appear normal. The kidneys and collecting system appear normal without evidence of urinary tract calculus or hydronephrosis. Bladder is unremarkable. Stomach/Bowel: The stomach and small bowel are unremarkable. There is diffuse wall thickening seen within the cecum, sigmoid colon and rectum. There is mild fat stranding changes seen around the sigmoid rectal junction. No pericolonic free fluid or free air is seen. No focal area of stenosis is seen. The appendix is unremarkable. Vascular/Lymphatic: There are no enlarged mesenteric, retroperitoneal, or pelvic lymph nodes. Scattered aortic atherosclerotic calcifications are seen without aneurysmal dilatation. Reproductive: The patient is status post hysterectomy. No adnexal masses or collections seen. Other: No evidence of abdominal wall mass or hernia. Musculoskeletal: No acute or significant osseous findings. IMPRESSION: 1. Wall thickening with surrounding inflammatory changes involving the cecum, sigmoid colon, and rectum, consistent with colitis. This could be due to infectious or inflammatory process.  Not thought to be due to ischemic colitis. Given patient the recent episode of colitis on March 11, 2019 would recommend direct visualization upon resolution of symptoms for underlying mass lesion. 2.  Aortic Atherosclerosis (ICD10-I70.0). Electronically Signed   By: BPrudencio PairM.D.   On: 04/15/2019 23:10

## 2019-04-29 NOTE — Assessment & Plan Note (Signed)
68 year old female requiring 2 recent hospitalizations for significant diarrhea.  In October her C. difficile testing was negative.  She was felt to be high risk due to recent antibiotic exposure therefore was empirically treated for C. difficile with vancomycin 125 mg 4 times daily for 10 days.  She had clinical improvement/resolution of symptoms but recurrent symptoms prompting another hospitalization this month.  This time for C. difficile toxin and antigen were both positive.  GI pathogen panel negative.  CT showed pancolitis.  She had protracted hospitalization with persisting abdominal pain and diarrhea.  Ultimately vancomycin increased to 500 mg every 6 hours.  Slowly improved.  Discharged about 9 days ago.  She states her abdominal pain resolved.  Her diarrhea resolved and she actually went 3 days without a bowel movement up until yesterday having many episodes of loose stool.  Notably also began back on magnesium a few days ago.  Anemia.  Iron normal.  B12 was low, has been adequately supplemented with oral B12.  We will follow-up labs today.  Low magnesium.  Recheck today.  Hold magnesium supplement as likely contributing to her diarrhea.  We will only restart if her magnesium is low.  For C. difficile she will complete vancomycin.  Right now she has a total of 14 days left.  If she has recurrent diarrhea we will provide longer taper.  Call and let me know.  We will also reassess when labs are back.  For pancolitis on CT, presumed to be due to C. difficile.  Colonoscopy remotely.  Recommend colonoscopy once symptoms have resolved to rule out underlying IBD.  Will augment conscious sedation with Phenergan 12.5 mg IV 30 minutes before the procedure.  Plan for colonoscopy around February.  I have discussed the risks, alternatives, benefits with regards to but not limited to the risk of reaction to medication, bleeding, infection, perforation and the patient is agreeable to proceed. Written consent to  be obtained.

## 2019-04-29 NOTE — Patient Outreach (Signed)
2 Red EMMI flags for general discharge received for day #4, scheduled follow up- no and other questions/ problems- yes.  Outreach call to pt for follow up, spoke with pt who reports she saw GI MD today and discussed any issues she had with MD, states she is still going to schedule follow up with primary MD and is able to do this herself, no other concerns voiced.  Jacqlyn Larsen [facility], [place] Coordinator [phone]

## 2019-04-29 NOTE — Progress Notes (Signed)
cc'ed to pcp °

## 2019-04-30 LAB — CBC WITH DIFFERENTIAL/PLATELET
Basophils Absolute: 0.1 10*3/uL (ref 0.0–0.2)
Basos: 1 %
EOS (ABSOLUTE): 0.5 10*3/uL — ABNORMAL HIGH (ref 0.0–0.4)
Eos: 7 %
Hematocrit: 33.8 % — ABNORMAL LOW (ref 34.0–46.6)
Hemoglobin: 10.9 g/dL — ABNORMAL LOW (ref 11.1–15.9)
Immature Grans (Abs): 0 10*3/uL (ref 0.0–0.1)
Immature Granulocytes: 0 %
Lymphocytes Absolute: 1.6 10*3/uL (ref 0.7–3.1)
Lymphs: 24 %
MCH: 28.8 pg (ref 26.6–33.0)
MCHC: 32.2 g/dL (ref 31.5–35.7)
MCV: 89 fL (ref 79–97)
Monocytes Absolute: 0.5 10*3/uL (ref 0.1–0.9)
Monocytes: 7 %
Neutrophils Absolute: 4.1 10*3/uL (ref 1.4–7.0)
Neutrophils: 61 %
Platelets: 390 10*3/uL (ref 150–450)
RBC: 3.78 x10E6/uL (ref 3.77–5.28)
RDW: 12.9 % (ref 11.7–15.4)
WBC: 6.7 10*3/uL (ref 3.4–10.8)

## 2019-04-30 LAB — BASIC METABOLIC PANEL
BUN/Creatinine Ratio: 22 (ref 12–28)
BUN: 19 mg/dL (ref 8–27)
CO2: 23 mmol/L (ref 20–29)
Calcium: 10.2 mg/dL (ref 8.7–10.3)
Chloride: 102 mmol/L (ref 96–106)
Creatinine, Ser: 0.85 mg/dL (ref 0.57–1.00)
GFR calc Af Amer: 81 mL/min/{1.73_m2} (ref >59)
GFR calc non Af Amer: 71 mL/min/{1.73_m2} (ref >59)
Glucose: 89 mg/dL (ref 65–99)
Potassium: 5.3 mmol/L — ABNORMAL HIGH (ref 3.5–5.2)
Sodium: 139 mmol/L (ref 134–144)

## 2019-04-30 LAB — MAGNESIUM: Magnesium: 2.2 mg/dL (ref 1.6–2.3)

## 2019-05-01 ENCOUNTER — Other Ambulatory Visit: Payer: Self-pay

## 2019-05-01 DIAGNOSIS — E875 Hyperkalemia: Secondary | ICD-10-CM

## 2019-05-06 ENCOUNTER — Telehealth: Payer: Self-pay | Admitting: *Deleted

## 2019-05-06 ENCOUNTER — Other Ambulatory Visit: Payer: Self-pay | Admitting: *Deleted

## 2019-05-06 DIAGNOSIS — E875 Hyperkalemia: Secondary | ICD-10-CM | POA: Diagnosis not present

## 2019-05-06 DIAGNOSIS — A0472 Enterocolitis due to Clostridium difficile, not specified as recurrent: Secondary | ICD-10-CM

## 2019-05-06 NOTE — Telephone Encounter (Signed)
Spoke with pt and she is scheduled for 3/19 at 8:30am. Also scheduled COVID testing for 3/17 at 2:30pm. Patient aware of location for testing. Also aware will mail prep instructions to her. Orders entered. Rx sent in.

## 2019-05-07 LAB — BASIC METABOLIC PANEL
BUN/Creatinine Ratio: 20 (ref 12–28)
BUN: 26 mg/dL (ref 8–27)
CO2: 20 mmol/L (ref 20–29)
Calcium: 10.3 mg/dL (ref 8.7–10.3)
Chloride: 103 mmol/L (ref 96–106)
Creatinine, Ser: 1.32 mg/dL — ABNORMAL HIGH (ref 0.57–1.00)
GFR calc Af Amer: 48 mL/min/{1.73_m2} — ABNORMAL LOW (ref >59)
GFR calc non Af Amer: 41 mL/min/{1.73_m2} — ABNORMAL LOW (ref >59)
Glucose: 90 mg/dL (ref 65–99)
Potassium: 4.8 mmol/L (ref 3.5–5.2)
Sodium: 141 mmol/L (ref 134–144)

## 2019-05-09 ENCOUNTER — Other Ambulatory Visit: Payer: Self-pay

## 2019-05-09 DIAGNOSIS — E876 Hypokalemia: Secondary | ICD-10-CM

## 2019-05-09 DIAGNOSIS — D649 Anemia, unspecified: Secondary | ICD-10-CM

## 2019-05-20 ENCOUNTER — Other Ambulatory Visit: Payer: Self-pay

## 2019-05-20 DIAGNOSIS — E876 Hypokalemia: Secondary | ICD-10-CM

## 2019-05-20 DIAGNOSIS — D649 Anemia, unspecified: Secondary | ICD-10-CM

## 2019-05-25 DIAGNOSIS — M25571 Pain in right ankle and joints of right foot: Secondary | ICD-10-CM | POA: Diagnosis not present

## 2019-05-25 DIAGNOSIS — M25572 Pain in left ankle and joints of left foot: Secondary | ICD-10-CM | POA: Diagnosis not present

## 2019-06-20 ENCOUNTER — Other Ambulatory Visit: Payer: Self-pay

## 2019-06-20 ENCOUNTER — Telehealth: Payer: Self-pay | Admitting: *Deleted

## 2019-06-20 DIAGNOSIS — E876 Hypokalemia: Secondary | ICD-10-CM

## 2019-06-20 DIAGNOSIS — D649 Anemia, unspecified: Secondary | ICD-10-CM | POA: Diagnosis not present

## 2019-06-20 NOTE — Telephone Encounter (Signed)
FYI to Neil Crouch, PA who ordered the labs.

## 2019-06-20 NOTE — Telephone Encounter (Signed)
Noted  

## 2019-06-20 NOTE — Telephone Encounter (Signed)
Pt wants blood work order switched over to [facility] on [address] Linna Hoff.  [phone]

## 2019-06-20 NOTE — Telephone Encounter (Signed)
I have entered the labs for [facility] and pt is aware she can go at her convenience and she does not need to take the orders with her.

## 2019-06-21 LAB — CBC WITH DIFFERENTIAL/PLATELET
Basophils Absolute: 0 10*3/uL (ref 0.0–0.2)
Basos: 1 %
EOS (ABSOLUTE): 0.1 10*3/uL (ref 0.0–0.4)
Eos: 2 %
Hematocrit: 36.3 % (ref 34.0–46.6)
Hemoglobin: 12.3 g/dL (ref 11.1–15.9)
Immature Grans (Abs): 0 10*3/uL (ref 0.0–0.1)
Immature Granulocytes: 0 %
Lymphocytes Absolute: 1.8 10*3/uL (ref 0.7–3.1)
Lymphs: 31 %
MCH: 29.5 pg (ref 26.6–33.0)
MCHC: 33.9 g/dL (ref 31.5–35.7)
MCV: 87 fL (ref 79–97)
Monocytes Absolute: 0.4 10*3/uL (ref 0.1–0.9)
Monocytes: 7 %
Neutrophils Absolute: 3.5 10*3/uL (ref 1.4–7.0)
Neutrophils: 59 %
Platelets: 311 10*3/uL (ref 150–450)
RBC: 4.17 x10E6/uL (ref 3.77–5.28)
RDW: 13.2 % (ref 11.7–15.4)
WBC: 6 10*3/uL (ref 3.4–10.8)

## 2019-06-21 LAB — BASIC METABOLIC PANEL
BUN/Creatinine Ratio: 21 (ref 12–28)
BUN: 21 mg/dL (ref 8–27)
CO2: 26 mmol/L (ref 20–29)
Calcium: 10.3 mg/dL (ref 8.7–10.3)
Chloride: 103 mmol/L (ref 96–106)
Creatinine, Ser: 1 mg/dL (ref 0.57–1.00)
GFR calc Af Amer: 67 mL/min/{1.73_m2} (ref >59)
GFR calc non Af Amer: 58 mL/min/{1.73_m2} — ABNORMAL LOW (ref >59)
Glucose: 86 mg/dL (ref 65–99)
Potassium: 5 mmol/L (ref 3.5–5.2)
Sodium: 141 mmol/L (ref 134–144)

## 2019-07-13 ENCOUNTER — Other Ambulatory Visit: Payer: Self-pay | Admitting: Podiatry

## 2019-07-17 NOTE — Telephone Encounter (Signed)
Can you see if she is still taking this and see how she is doing? Thanks.

## 2019-07-17 NOTE — Telephone Encounter (Signed)
Called and left a message for the patient to call me back at the [place] office [phone]. Lattie Haw

## 2019-07-17 NOTE — Telephone Encounter (Signed)
Hey Dr Wagoner can you please advise? Thanks Glennie Bose 

## 2019-07-18 NOTE — Telephone Encounter (Signed)
Pt returned call and is needing her medication refilled. She said she needed them all but was not sure which one [facility] had [facility].

## 2019-07-30 ENCOUNTER — Telehealth: Payer: Self-pay | Admitting: Gastroenterology

## 2019-07-30 NOTE — Telephone Encounter (Signed)
Called pt, she is wanting EGD and Dilation at time of already scheduled TCS. States she got choked on chicken a few weeks ago and her grandson had to shake her and chicken came up. She occasionally gets choked. She thinks last dilation was in 2005. Advised her message would be sent to [facility].

## 2019-07-30 NOTE — Telephone Encounter (Signed)
Pt called to say she is scheduled colonoscopy with [facility] on 3/19 and could she have an EGD as well. Please advise. [phone]

## 2019-07-31 NOTE — Telephone Encounter (Signed)
Is there space to add on egd/ed for dysphagia?

## 2019-08-01 NOTE — Telephone Encounter (Signed)
Unfortunately there is no spot to add an EGD to her colonoscopy. I spoke to [place], if she wanted both at the same time we would have to put off till May. I would not advise putting off colonoscopy that far out.   I recommend colonoscopy as scheduled. And then egd at later date.

## 2019-08-01 NOTE — Telephone Encounter (Signed)
Spoke to endo scheduler, no space to add EGD 08/23/19.

## 2019-08-05 NOTE — Telephone Encounter (Signed)
[facility] advised for pt to have OV to discuss dysphagia. Called pt, OV scheduled for 08/28/19.

## 2019-08-19 ENCOUNTER — Telehealth: Payer: Self-pay | Admitting: Gastroenterology

## 2019-08-19 MED ORDER — PEG 3350-KCL-NA BICARB-NACL 420 G PO SOLR: 4000.0000 mL | ORAL | 0 refills | Status: DC

## 2019-08-19 NOTE — Telephone Encounter (Signed)
Pt is scheduled with [facility] for Friday and needs [facility] into [facility] [address]

## 2019-08-19 NOTE — Telephone Encounter (Signed)
Noted  

## 2019-08-19 NOTE — Telephone Encounter (Signed)
Pt called back to inform MB that the pharmacy does have prep kit and is going to fill it.

## 2019-08-19 NOTE — Telephone Encounter (Signed)
Rx sent to pharmacy. Called and informed pt. Advised her to call office if prep is out of stock.

## 2019-08-21 ENCOUNTER — Other Ambulatory Visit: Payer: Self-pay

## 2019-08-21 ENCOUNTER — Other Ambulatory Visit (HOSPITAL_COMMUNITY)
Admission: RE | Admit: 2019-08-21 | Discharge: 2019-08-21 | Disposition: A | Discharge status: Home / Self Care | Payer: Medicare Other | Source: Ambulatory Visit | Attending: Gastroenterology | Admitting: Gastroenterology

## 2019-08-21 DIAGNOSIS — Z01812 Encounter for preprocedural laboratory examination: Secondary | ICD-10-CM | POA: Insufficient documentation

## 2019-08-21 DIAGNOSIS — Z20822 Contact with and (suspected) exposure to covid-19: Secondary | ICD-10-CM | POA: Insufficient documentation

## 2019-08-21 LAB — SARS CORONAVIRUS 2 (TAT 6-24 HRS): SARS Coronavirus 2: NEGATIVE

## 2019-08-22 ENCOUNTER — Telehealth: Payer: Self-pay

## 2019-08-22 MED ORDER — SUPREP BOWEL PREP KIT 17.5-3.13-1.6 GM/177ML PO SOLN: 1.0000 | ORAL | 0 refills | Status: DC

## 2019-08-22 NOTE — Telephone Encounter (Signed)
Rx for Suprep sent to pharmacy. Instructions faxed to pharmacy for pt to pickup with prep. Called and informed pt.

## 2019-08-22 NOTE — Addendum Note (Signed)
Addended by: Hassan Rowan on: 08/22/2019 09:04 AM   Modules accepted: Orders

## 2019-08-22 NOTE — Telephone Encounter (Signed)
[facility] [place] doesn't have the prep prescribed for pt. They aren't able to get the gallons. Pt needs to start prep today for procedure.  Suprep or Miralax/gatorade.  [phone].

## 2019-08-23 ENCOUNTER — Other Ambulatory Visit: Payer: Self-pay

## 2019-08-23 ENCOUNTER — Encounter (HOSPITAL_COMMUNITY)
Admission: RE | Disposition: A | Discharge status: Home / Self Care | Payer: Self-pay | Source: Ambulatory Visit | Attending: Gastroenterology

## 2019-08-23 ENCOUNTER — Ambulatory Visit (HOSPITAL_COMMUNITY)
Admission: RE | Admit: 2019-08-23 | Discharge: 2019-08-23 | Disposition: A | Discharge status: Home / Self Care | Payer: Medicare Other | Source: Ambulatory Visit | Attending: Gastroenterology | Admitting: Gastroenterology

## 2019-08-23 ENCOUNTER — Encounter (HOSPITAL_COMMUNITY): Payer: Self-pay | Admitting: Gastroenterology

## 2019-08-23 DIAGNOSIS — K648 Other hemorrhoids: Secondary | ICD-10-CM | POA: Insufficient documentation

## 2019-08-23 DIAGNOSIS — Q438 Other specified congenital malformations of intestine: Secondary | ICD-10-CM | POA: Diagnosis not present

## 2019-08-23 DIAGNOSIS — Z79899 Other long term (current) drug therapy: Secondary | ICD-10-CM | POA: Insufficient documentation

## 2019-08-23 DIAGNOSIS — R935 Abnormal findings on diagnostic imaging of other abdominal regions, including retroperitoneum: Secondary | ICD-10-CM | POA: Insufficient documentation

## 2019-08-23 DIAGNOSIS — M8580 Other specified disorders of bone density and structure, unspecified site: Secondary | ICD-10-CM | POA: Insufficient documentation

## 2019-08-23 DIAGNOSIS — A0472 Enterocolitis due to Clostridium difficile, not specified as recurrent: Secondary | ICD-10-CM

## 2019-08-23 DIAGNOSIS — Z8541 Personal history of malignant neoplasm of cervix uteri: Secondary | ICD-10-CM | POA: Diagnosis not present

## 2019-08-23 DIAGNOSIS — Z1211 Encounter for screening for malignant neoplasm of colon: Secondary | ICD-10-CM | POA: Diagnosis not present

## 2019-08-23 DIAGNOSIS — K644 Residual hemorrhoidal skin tags: Secondary | ICD-10-CM | POA: Diagnosis not present

## 2019-08-23 DIAGNOSIS — K219 Gastro-esophageal reflux disease without esophagitis: Secondary | ICD-10-CM | POA: Diagnosis not present

## 2019-08-23 HISTORY — PX: COLONOSCOPY: SHX174

## 2019-08-23 HISTORY — PX: COLONOSCOPY: SHX5424

## 2019-08-23 SURGERY — COLONOSCOPY
Anesthesia: Moderate Sedation

## 2019-08-23 MED ORDER — STERILE WATER FOR IRRIGATION IR SOLN
Status: DC | PRN
  Administered 2019-08-23: 2.5 mL

## 2019-08-23 MED ORDER — MEPERIDINE HCL 100 MG/ML IJ SOLN
INTRAMUSCULAR | Status: DC | PRN
  Administered 2019-08-23: 25 mg via INTRAVENOUS

## 2019-08-23 MED ORDER — MIDAZOLAM HCL 5 MG/5ML IJ SOLN
INTRAMUSCULAR | Status: DC | PRN
  Administered 2019-08-23: 2 mg via INTRAVENOUS
  Administered 2019-08-23: 1 mg via INTRAVENOUS

## 2019-08-23 MED ORDER — MIDAZOLAM HCL 5 MG/5ML IJ SOLN
INTRAMUSCULAR | Status: AC
  Filled 2019-08-23: qty 10

## 2019-08-23 MED ORDER — MEPERIDINE HCL 100 MG/ML IJ SOLN
INTRAMUSCULAR | Status: AC
  Filled 2019-08-23: qty 2

## 2019-08-23 MED ORDER — SODIUM CHLORIDE FLUSH 0.9 % IV SOLN
INTRAVENOUS | Status: AC
  Filled 2019-08-23: qty 10

## 2019-08-23 MED ORDER — PROMETHAZINE HCL 25 MG/ML IJ SOLN
INTRAMUSCULAR | Status: AC
  Administered 2019-08-23: 6.25 mg
  Filled 2019-08-23: qty 1

## 2019-08-23 MED ORDER — PROMETHAZINE HCL 25 MG/ML IJ SOLN: 6.2500 mg | Freq: Once | INTRAMUSCULAR | Status: DC

## 2019-08-23 MED ORDER — SODIUM CHLORIDE 0.9 % IV SOLN: INTRAVENOUS | Status: DC

## 2019-08-23 NOTE — H&P (Signed)
Primary Care Physician:  Susy Frizzle, MD Primary Gastroenterologist:  Dr. Oneida Alar  Pre-Procedure History & Physical: HPI:  Rebecca Osborne is a 69 y.o. female here for Abnormal CT scan of colon.  Past Medical History:  Diagnosis Date  . Allergy   . Cancer (HCC)    cervical cancer cells  . GERD (gastroesophageal reflux disease)   . Osteopenia     Past Surgical History:  Procedure Laterality Date  . ABDOMINAL HYSTERECTOMY    . BOWEL RESECTION N/A 06/22/2016   Procedure: SMALL BOWEL RESECTION;  Surgeon: Vickie Epley, MD;  Location: [facility];  Service: General;  Laterality: N/A;  . CHOLECYSTECTOMY  2005  . COLONOSCOPY WITH ESOPHAGOGASTRODUODENOSCOPY (EGD)  09/2009   [facility]: Normal colonoscopy.  H. pylori gastritis.  Schatzki ring status post esophageal dilation.  Next colonoscopy 10 years  . FRACTURE SURGERY     rt foot  . LAPAROTOMY N/A 06/22/2016   Procedure: EXPLORATORY LAPAROTOMY;  Surgeon: Vickie Epley, MD;  Location: [facility];  Service: General;  Laterality: N/A;  . LYSIS OF ADHESION N/A 06/22/2016   Procedure: LYSIS OF ADHESION;  Surgeon: Vickie Epley, MD;  Location: [facility];  Service: General;  Laterality: N/A;    Prior to Admission medications   Medication Sig Start Date End Date Taking? Authorizing Provider  acetaminophen (TYLENOL) 500 MG tablet Take 500 mg by mouth every 8 (eight) hours as needed for moderate pain or headache.   Yes [provider]  Cholecalciferol (VITAMIN D) 50 MCG (2000 UT) tablet Take 2,000 Units by mouth daily.   Yes [provider]  DULoxetine (CYMBALTA) 30 MG capsule TAKE 1 CAPSULE BY MOUTH EVERY DAY Patient taking differently: Take 30 mg by mouth daily.  07/18/19  Yes Trula Slade, DPM  Na Sulfate-K Sulfate-Mg Sulf (SUPREP BOWEL PREP KIT) 17.5-3.13-1.6 GM/177ML SOLN Take 1 kit by mouth as directed. 08/22/19  Yes Bruchy Mikel L, MD  polyethylene glycol-electrolytes (TRILYTE) 420 g solution Take 4,000 mLs by mouth as  directed. 08/19/19   Kayti Poss, Marga Melnick, MD  saccharomyces boulardii (FLORASTOR) 250 MG capsule Take 1 capsule (250 mg total) by mouth 2 (two) times daily. Patient not taking: Reported on 08/16/2019 04/20/19   Kathie Dike, MD  vancomycin (VANCOCIN) 50 mg/mL oral solution Take 534m po qid for 10 days then 5047mpo bid for 10 days Patient not taking: Reported on 08/16/2019 04/20/19   MeKathie DikeMD    Allergies as of 05/06/2019 - Review Complete 04/15/2019  Allergen Reaction Noted  . Codeine Itching     Family History  Problem Relation Age of Onset  . COPD Mother   . Heart disease Mother   . Miscarriages / [place]   . Vision loss Mother        from shingles  . COPD Father   . Cancer Brother        leukemia  . Early death Brother   . Colon cancer Neg Hx   . Colon polyps Neg Hx   . Inflammatory bowel disease Neg Hx     Social History   Socioeconomic History  . Marital status: Widowed    Spouse name: Not on file  . Number of children: Not on file  . Years of education: Not on file  . Highest education level: Not on file  Occupational History  . Not on file  Tobacco Use  . Smoking status: Never Smoker  . Smokeless tobacco: Never Used  Substance and Sexual  Activity  . Alcohol use: No  . Drug use: No  . Sexual activity: Not Currently  Other Topics Concern  . Not on file  Social History Narrative  . Not on file   Social Determinants of Health   Financial Resource Strain:   . Difficulty of Paying Living Expenses:   Food Insecurity:   . Worried About Charity fundraiser in the Last Year:   . Arboriculturist in the Last Year:   Transportation Needs:   . Film/video editor (Medical):   Marland Kitchen Lack of Transportation (Non-Medical):   Physical Activity:   . Days of Exercise per Week:   . Minutes of Exercise per Session:   Stress:   . Feeling of Stress :   Social Connections:   . Frequency of Communication with Friends and Family:   . Frequency of  Social Gatherings with Friends and Family:   . Attends Religious Services:   . Active Member of Clubs or Organizations:   . Attends Archivist Meetings:   Marland Kitchen Marital Status:   Intimate Partner Violence:   . Fear of Current or Ex-Partner:   . Emotionally Abused:   Marland Kitchen Physically Abused:   . Sexually Abused:     Review of Systems: See HPI, otherwise negative ROS   Physical Exam: BP 130/72   Pulse 72   Temp 98 F (36.7 C) (Oral)   Resp 14   Ht 5' 1"  (1.549 m)   SpO2 97%   BMI 21.24 kg/m  General:   Alert,  pleasant and cooperative in NAD Head:  Normocephalic and atraumatic. Neck:  Supple; Lungs:  Clear throughout to auscultation.    Heart:  Regular rate and rhythm. Abdomen:  Soft, nontender and nondistended. Normal bowel sounds, without guarding, and without rebound.   Neurologic:  Alert and  oriented x4;  grossly normal neurologically.  Impression/Plan:     Abnormal CT scan of colon  Plan:  TCS today. DISCUSSED PROCEDURE, BENEFITS, & RISKS: < 1% chance of medication reaction, bleeding, perforation, ASPIRATION, or rupture of spleen/liver requiring surgery to fix it and missed polyps < 1 cm 10-20% of the time.

## 2019-08-23 NOTE — Op Note (Signed)
[facility] Patient Name: Rebecca Osborne Procedure Date: 08/23/2019 10:02 AM MRN: [id number] Date of Birth: 09/05/1950 Attending MD: Barney Drain MD, MD CSN: [id number] Age: 69 Admit Type: Outpatient Procedure:                Colonoscopy, SCREENING Indications:              Screening for colorectal malignant neoplasm Providers:                Barney Drain MD, MD, Lurline Del, RN, Aram Candela Referring MD:             Cammie Mcgee. [place] Medicines:                Promethazine 6.25 mg IV, Meperidine 25 mg IV,                            Midazolam 3 mg IV Complications:            No immediate complications. Estimated Blood Loss:     Estimated blood loss: none. Procedure:                Pre-Anesthesia Assessment:                           - Prior to the procedure, a History and Physical                            was performed, and patient medications and                            allergies were reviewed. The patient's tolerance of                            previous anesthesia was also reviewed. The risks                            and benefits of the procedure and the sedation                            options and risks were discussed with the patient.                            All questions were answered, and informed consent                            was obtained. Prior Anticoagulants: The patient has                            taken no previous anticoagulant or antiplatelet                            agents. ASA Grade Assessment: II - A patient with                            mild systemic disease. After reviewing the risks  and benefits, the patient was deemed in                            satisfactory condition to undergo the procedure.                            After obtaining informed consent, the colonoscope                            was passed under direct vision. Throughout the                            procedure, the patient's blood pressure,  pulse, and                            oxygen saturations were monitored continuously. The                            [id number]-[id number] ([id number]) scope was introduced through                            the anus and advanced to the the cecum, identified                            by appendiceal orifice and ileocecal valve. The                            colonoscopy was somewhat difficult due to a                            tortuous colon. Successful completion of the                            procedure was aided by straightening and shortening                            the scope to obtain bowel loop reduction and                            COLOWRAP. The patient tolerated the procedure                            fairly well. The quality of the bowel preparation                            was excellent. The ileocecal valve, appendiceal                            orifice, and rectum were photographed. Scope In: 10:57:48 AM Scope Out: 37:90:24 AM Scope Withdrawal Time: 0 hours 7 minutes 50 seconds  Total Procedure Duration: 0 hours 11 minutes 24 seconds  Findings:      The recto-sigmoid colon, sigmoid colon and descending colon were       moderately tortuous.  External and internal hemorrhoids were found.      The exam was otherwise without abnormality. Impression:               - Tortuous LEFT colon.                           - External and internal hemorrhoids. Moderate Sedation:      Moderate (conscious) sedation was administered by the endoscopy nurse       and supervised by the endoscopist. The following parameters were       monitored: oxygen saturation, heart rate, blood pressure, and response       to care. Total physician intraservice time was 21 minutes. Recommendation:           - Patient has a contact number available for                            emergencies. The signs and symptoms of potential                            delayed complications were discussed with the                             patient. Return to normal activities tomorrow.                            Written discharge instructions were provided to the                            patient.                           - High fiber diet.                           - Continue present medications.                           - Await pathology results.                           - Repeat colonoscopy date to be determined after                            pending pathology results are reviewed for                            surveillance. Procedure Code(s):        --- Professional ---                           [phone], Colonoscopy, flexible; diagnostic, including                            collection of specimen(s) by brushing or washing,                            when  performed (separate procedure)                           G0500, Moderate sedation services provided by the                            same physician or other qualified health care                            professional performing a gastrointestinal                            endoscopic service that sedation supports,                            requiring the presence of an independent trained                            observer to assist in the monitoring of the                            patient's level of consciousness and physiological                            status; initial 15 minutes of intra-service time;                            patient age 62 years or older (additional time may                            be reported with [phone], as appropriate) Diagnosis Code(s):        --- Professional ---                           Z12.11, Encounter for screening for malignant                            neoplasm of colon                           K64.8, Other hemorrhoids                           Q43.8, Other specified congenital malformations of                            intestine CPT copyright 2019 American Medical Association. All rights  reserved. The codes documented in this report are preliminary and upon coder review may  be revised to meet current compliance requirements. Barney Drain, MD Barney Drain MD, MD 08/23/2019 11:17:53 AM This report has been signed electronically. Number of Addenda: 0

## 2019-08-23 NOTE — Discharge Instructions (Signed)
You have internal and external hemorrhoids. YOU DID NOT HAVE ANY POLYPS.   DRINK WATER TO KEEP YOUR URINE LIGHT YELLOW.  FOLLOW A HIGH FIBER DIET. AVOID ITEMS THAT CAUSE BLOATING. SEE INFO BELOW.   USE PREPARATION H FOUR TIMES  A DAY IF NEEDED TO RELIEVE RECTAL PAIN/PRESSURE/BLEEDING.   We do not routinely screen for polyps after the age of 34.  Colonoscopy Care After Read the instructions outlined below and refer to this sheet in the next week. These discharge instructions provide you with general information on caring for yourself after you leave the hospital. While your treatment has been planned according to the most current medical practices available, unavoidable complications occasionally occur. If you have any problems or questions after discharge, call DR. FIELDS, [phone].  ACTIVITY  You may resume your regular activity, but move at a slower pace for the next 24 hours.   Take frequent rest periods for the next 24 hours.   Walking will help get rid of the air and reduce the bloated feeling in your belly (abdomen).   No driving for 24 hours (because of the medicine (anesthesia) used during the test).   You may shower.   Do not sign any important legal documents or operate any machinery for 24 hours (because of the anesthesia used during the test).    NUTRITION  Drink plenty of fluids.   You may resume your normal diet as instructed by your doctor.   Begin with a light meal and progress to your normal diet. Heavy or fried foods are harder to digest and may make you feel sick to your stomach (nauseated).   Avoid alcoholic beverages for 24 hours or as instructed.    MEDICATIONS  You may resume your normal medications.   WHAT YOU CAN EXPECT TODAY  Some feelings of bloating in the abdomen.   Passage of more gas than usual.   Spotting of blood in your stool or on the toilet paper  .  IF YOU HAD POLYPS REMOVED DURING THE COLONOSCOPY:  Eat a soft diet IF  YOU HAVE NAUSEA, BLOATING, ABDOMINAL PAIN, OR VOMITING.    FINDING OUT THE RESULTS OF YOUR TEST Not all test results are available during your visit. DR. Oneida Alar WILL CALL YOU WITHIN 14 DAYS OF YOUR PROCEDUE WITH YOUR RESULTS. Do not assume everything is normal if you have not heard from DR. FIELDS, CALL HER OFFICE AT [phone].  SEEK IMMEDIATE MEDICAL ATTENTION AND CALL THE OFFICE: [phone] IF:  You have more than a spotting of blood in your stool.   Your belly is swollen (abdominal distention).   You are nauseated or vomiting.   You have a temperature over 101F.   You have abdominal pain or discomfort that is severe or gets worse throughout the day.  High-Fiber Diet A high-fiber diet changes your normal diet to include more whole grains, legumes, fruits, and vegetables. Changes in the diet involve replacing refined carbohydrates with unrefined foods. The calorie level of the diet is essentially unchanged. The Dietary Reference Intake (recommended amount) for adult males is 38 grams per day. For adult females, it is 25 grams per day. Pregnant and lactating women should consume 28 grams of fiber per day. Fiber is the intact part of a plant that is not broken down during digestion. Functional fiber is fiber that has been isolated from the plant to provide a beneficial effect in the body.  PURPOSE Increase stool bulk.  Ease and regulate bowel movements.  Lower  cholesterol.  REDUCE RISK OF COLON CANCER  INDICATIONS THAT YOU NEED MORE FIBER Constipation and hemorrhoids.  Uncomplicated diverticulosis (intestine condition) and irritable bowel syndrome.  Weight management.  As a protective measure against hardening of the arteries (atherosclerosis), diabetes, and cancer.   GUIDELINES FOR INCREASING FIBER IN THE DIET Start adding fiber to the diet slowly. A gradual increase of about 5 more grams (2 servings of most fruits or vegetables) per day is best. Too rapid an increase in fiber  may result in constipation, flatulence, and bloating.  Drink enough water and fluids to keep your urine clear or pale yellow. Water, juice, or caffeine-free drinks are recommended. Not drinking enough fluid may cause constipation.  Eat a variety of high-fiber foods rather than one type of fiber.  Try to increase your intake of fiber through using high-fiber foods rather than fiber pills or supplements that contain small amounts of fiber.  The goal is to change the types of food eaten. Do not supplement your present diet with high-fiber foods, but replace foods in your present diet.    Hemorrhoids Hemorrhoids are dilated (enlarged) veins around the rectum. Sometimes clots will form in the veins. This makes them swollen and painful. These are called thrombosed hemorrhoids. Causes of hemorrhoids include:  Constipation.   Straining to have a bowel movement.   HEAVY LIFTING  HOME CARE INSTRUCTIONS  Eat a well balanced diet and drink 6 to 8 glasses of water every day to avoid constipation. You may also use a bulk laxative.   Avoid straining to have bowel movements.   Keep anal area dry and clean.   Do not use a donut shaped pillow or sit on the toilet for long periods. This increases blood pooling and pain.   Move your bowels when your body has the urge; this will require less straining and will decrease pain and pressure.

## 2019-08-28 ENCOUNTER — Ambulatory Visit (INDEPENDENT_AMBULATORY_CARE_PROVIDER_SITE_OTHER): Payer: Medicare Other | Admitting: Gastroenterology

## 2019-08-28 ENCOUNTER — Encounter: Payer: Self-pay | Admitting: *Deleted

## 2019-08-28 ENCOUNTER — Encounter: Payer: Self-pay | Admitting: Gastroenterology

## 2019-08-28 ENCOUNTER — Other Ambulatory Visit: Payer: Self-pay

## 2019-08-28 ENCOUNTER — Telehealth: Payer: Self-pay | Admitting: *Deleted

## 2019-08-28 VITALS — BP 140/76 | HR 73 | Temp 97.1°F | Ht 61.0 in | Wt 137.0 lb

## 2019-08-28 DIAGNOSIS — K219 Gastro-esophageal reflux disease without esophagitis: Secondary | ICD-10-CM | POA: Diagnosis not present

## 2019-08-28 DIAGNOSIS — R1310 Dysphagia, unspecified: Secondary | ICD-10-CM | POA: Diagnosis not present

## 2019-08-28 DIAGNOSIS — R1319 Other dysphagia: Secondary | ICD-10-CM

## 2019-08-28 MED ORDER — FAMOTIDINE 20 MG PO TABS: 20.0000 mg | ORAL_TABLET | Freq: Every day | ORAL | 2 refills | Status: DC

## 2019-08-28 NOTE — Patient Instructions (Addendum)
1. Upper endoscopy as scheduled. See separate instructions.  2. Start pepcid 20 mg daily.  3. Until your procedure, consume soft foods only. See handout below.    Soft-Food Eating Plan A soft-food eating plan includes foods that are safe and easy to chew and swallow. Your health care provider or dietitian can help you find foods and flavors that fit into this plan. Follow this plan until your health care provider or dietitian says it is safe to start eating other foods and food textures. What are tips for following this plan? General guidelines   Take small bites of food, or cut food into pieces about  inch or smaller. Bite-sized pieces of food are easier to chew and swallow.  Eat moist foods. Avoid overly dry foods.  Avoid foods that: ? Are difficult to swallow, such as dry, chunky, crispy, or sticky foods. ? Are difficult to chew, such as hard, tough, or stringy foods. ? Contain nuts, seeds, or fruits.  Follow instructions from your dietitian about the types of liquids that are safe for you to swallow. You may be allowed to have: ? Thick liquids only. This includes only liquids that are thicker than honey. ? Thin and thick liquids. This includes all beverages and foods that become liquid at room temperature.  To make thick liquids: ? Purchase a commercial liquid thickening powder. These are available at grocery stores and pharmacies. ? Mix the thickener into liquids according to instructions on the label. ? Purchase ready-made thickened liquids. ? Thicken soup by pureeing, straining to remove chunks, and adding flour, potato flakes, or corn starch. ? Add commercial thickener to foods that become liquid at room temperature, such as milk shakes, yogurt, ice cream, gelatin, and sherbet.  Ask your health care provider whether you need to take a fiber supplement. Cooking  Cook meats so they stay tender and moist. Use methods like braising, stewing, or baking in liquid.  Cook  vegetables and fruit until they are soft enough to be mashed with a fork.  Peel soft, fresh fruits such as peaches, nectarines, and melons.  When making soup, make sure chunks of meat and vegetables are smaller than  inch.  Reheat leftover foods slowly so that a tough crust does not form. What foods are allowed? The items listed below may not be a complete list. Talk with your dietitian about what dietary choices are best for you. Grains Breads, muffins, pancakes, or waffles moistened with syrup, jelly, or butter. Dry cereals well-moistened with milk. Moist, cooked cereals. Well-cooked pasta and rice. Vegetables All soft-cooked vegetables. Shredded lettuce. Fruits All canned and cooked fruits. Soft, peeled fresh fruits. Strawberries. Dairy Milk. Cream. Yogurt. Cottage cheese. Soft cheese without the rind. Meats and other protein foods Tender, moist ground meat, poultry, or fish. Meat cooked in gravy or sauces. Eggs. Sweets and desserts Ice cream. Milk shakes. Sherbet. Pudding. Fats and oils Butter. Margarine. Olive, canola, sunflower, and grapeseed oil. Smooth salad dressing. Smooth cream cheese. Mayonnaise. Gravy. What foods are not allowed? The items listed bemay not be a complete list. Talk with your dietitian about what dietary choices are best for you. Grains Coarse or dry cereals, such as bran, granola, and shredded wheat. Tough or chewy crusty breads, such as [place] bread or baguettes. Breads with nuts, seeds, or fruit. Vegetables All raw vegetables. Cooked corn. Cooked vegetables that are tough or stringy. Tough, crisp, fried potatoes and potato skins. Fruits Fresh fruits with skins or seeds, or both, such as apples, pears,  and grapes. Stringy, high-pulp fruits, such as papaya, pineapple, coconut, and mango. Fruit leather and all dried fruit. Dairy Yogurt with nuts or coconut. Meats and other protein foods Hard, dry sausages. Dry meat, poultry, or fish. Meats with gristle.  Fish with bones. Fried meat or fish. Lunch meat and hotdogs. Nuts and seeds. Chunky peanut butter or other nut butters. Sweets and desserts Cakes or cookies that are very dry or chewy. Desserts with dried fruit, nuts, or coconut. Fried pastries. Very rich pastries. Fats and oils Cream cheese with fruit or nuts. Salad dressings with seeds or chunks. Summary  A soft-food eating plan includes foods that are safe and easy to swallow. Generally, the foods should be soft enough to be mashed with a fork.  Avoid foods that are dry, hard to chew, crunchy, sticky, stringy, or crispy.  Ask your health care provider whether you need to thicken your liquids and if you need to take a fiber supplement. This information is not intended to replace advice given to you by your health care provider. Make sure you discuss any questions you have with your health care provider. Document Revised: 09/13/2018 Document Reviewed: 07/26/2016 Elsevier Patient Education  [place].

## 2019-08-28 NOTE — Telephone Encounter (Signed)
PA approved via [facility] website. Auth# [id number] dates 10/16/2019-01/14/20

## 2019-08-28 NOTE — Progress Notes (Signed)
Primary Care Physician: Susy Frizzle, MD  Primary Gastroenterologist:  Barney Drain, MD   Chief Complaint  Patient presents with  . Dysphagia    food gets stuck    HPI: Rebecca Osborne is a 69 y.o. female here for further evaluation of dysphagia to solid foods. Symptoms going on for several months. Worse with chicken/steak/bread. Grandson had to do Heimlich on her recently to get food out. Came up finally with vomiting. Tries to chew food thoroughly, wash down with liquids but doesn't always help. No problems with swallowing her pills. She is having a lot of heartburn. She is using TUMS. Her PPI was stopped last year when she was hospitalized with Cdiff. BM regular. No melena, brbpr. She has a raspy voice but states this is her normal.   She had a colonoscopy back in March 2021 showing tortuous left colon, external and internal hemorrhoids.  Current Outpatient Medications  Medication Sig Dispense Refill  . acetaminophen (TYLENOL) 500 MG tablet Take 500 mg by mouth every 8 (eight) hours as needed for moderate pain or headache.    . Cholecalciferol (VITAMIN D) 50 MCG (2000 UT) tablet Take 2,000 Units by mouth daily.    . DULoxetine (CYMBALTA) 30 MG capsule TAKE 1 CAPSULE BY MOUTH EVERY DAY (Patient taking differently: Take 30 mg by mouth daily. ) 90 capsule 0   No current facility-administered medications for this visit.    Allergies as of 08/28/2019 - Review Complete 08/28/2019  Allergen Reaction Noted  . Codeine Itching    Past Medical History:  Diagnosis Date  . Allergy   . Cancer (HCC)    cervical cancer cells  . GERD (gastroesophageal reflux disease)   . Osteopenia    Past Surgical History:  Procedure Laterality Date  . ABDOMINAL HYSTERECTOMY    . BOWEL RESECTION N/A 06/22/2016   Procedure: SMALL BOWEL RESECTION;  Surgeon: Vickie Epley, MD;  Location: [facility];  Service: General;  Laterality: N/A;  . CHOLECYSTECTOMY  2005  . COLONOSCOPY  08/23/2019   Tortuous left colon, external and internal hemorrhoids  . COLONOSCOPY N/A 08/23/2019   Procedure: COLONOSCOPY;  Surgeon: Danie Binder, MD;  Location: [facility];  Service: Endoscopy;  Laterality: N/A;  8:30  . COLONOSCOPY WITH ESOPHAGOGASTRODUODENOSCOPY (EGD)  09/2009   Fields: Normal colonoscopy.  H. pylori gastritis.  Schatzki ring status post esophageal dilation.  Next colonoscopy 10 years  . FRACTURE SURGERY     rt foot  . LAPAROTOMY N/A 06/22/2016   Procedure: EXPLORATORY LAPAROTOMY;  Surgeon: Vickie Epley, MD;  Location: [facility];  Service: General;  Laterality: N/A;  . LYSIS OF ADHESION N/A 06/22/2016   Procedure: LYSIS OF ADHESION;  Surgeon: Vickie Epley, MD;  Location: [facility];  Service: General;  Laterality: N/A;   Family History  Problem Relation Age of Onset  . COPD Mother   . Heart disease Mother   . Miscarriages / [place] Mother   . Vision loss Mother        from shingles  . COPD Father   . Cancer Brother        leukemia  . Early death Brother   . Colon cancer Neg Hx   . Colon polyps Neg Hx   . Inflammatory bowel disease Neg Hx    Social History   Tobacco Use  . Smoking status: Never Smoker  . Smokeless tobacco: Never Used  Substance Use Topics  . Alcohol use: No  .  Drug use: No    ROS:  General: Negative for anorexia, weight loss, fever, chills, fatigue, weakness. ENT: Negative for hoarseness, nasal congestion. See hpi CV: Negative for chest pain, angina, palpitations, dyspnea on exertion, peripheral edema.  Respiratory: Negative for dyspnea at rest, dyspnea on exertion, cough, sputum, wheezing.  GI: See history of present illness. GU:  Negative for dysuria, hematuria, urinary incontinence, urinary frequency, nocturnal urination.  Endo: Negative for unusual weight change.    Physical Examination:   BP 140/76   Pulse 73   Temp (!) 97.1 F (36.2 C) (Temporal)   Ht 5' 1"  (1.549 m)   Wt 137 lb (62.1 kg)   BMI 25.89 kg/m   General:  Well-nourished, well-developed in no acute distress.  Eyes: No icterus. Mouth: masked. Lungs: Clear to auscultation bilaterally.  Heart: Regular rate and rhythm, no murmurs rubs or gallops.  Abdomen: Bowel sounds are normal, nontender, nondistended, no hepatosplenomegaly or masses, no abdominal bruits or hernia , no rebound or guarding.   Extremities: No lower extremity edema. No clubbing or deformities. Neuro: Alert and oriented x 4   Skin: Warm and dry, no jaundice.   Psych: Alert and cooperative, normal mood and affect.  Labs:  Lab Results  Component Value Date   CREATININE 1.00 06/20/2019   BUN 21 06/20/2019   NA 141 06/20/2019   K 5.0 06/20/2019   CL 103 06/20/2019   CO2 26 06/20/2019   Lab Results  Component Value Date   WBC 6.0 06/20/2019   HGB 12.3 06/20/2019   HCT 36.3 06/20/2019   MCV 87 06/20/2019   PLT 311 06/20/2019   Lab Results  Component Value Date   ALT 12 04/16/2019   AST 13 (L) 04/16/2019   ALKPHOS 63 04/16/2019   BILITOT 0.5 04/16/2019     Imaging Studies: No results found.

## 2019-08-29 ENCOUNTER — Encounter: Payer: Self-pay | Admitting: Gastroenterology

## 2019-08-30 NOTE — Assessment & Plan Note (Addendum)
Solid food dysphagia in setting of poorly controlled GERD. PPI stopped due to Cdiff last year. Will start pepcid 6m daily, may have to titrate up for control of heartburn. She may have esophageal stricture, web, or ring. Recommend EGD/ED in near future.  I have discussed the risks, alternatives, benefits with regards to but not limited to the risk of reaction to medication, bleeding, infection, perforation and the patient is agreeable to proceed. Written consent to be obtained.  Recommend soft diet until procedure.

## 2019-10-08 ENCOUNTER — Other Ambulatory Visit: Payer: Self-pay | Admitting: Podiatry

## 2019-10-14 ENCOUNTER — Other Ambulatory Visit (HOSPITAL_COMMUNITY)
Admission: RE | Admit: 2019-10-14 | Discharge: 2019-10-14 | Disposition: A | Discharge status: Home / Self Care | Payer: Medicare Other | Source: Ambulatory Visit | Attending: Internal Medicine | Admitting: Internal Medicine

## 2019-10-14 ENCOUNTER — Other Ambulatory Visit: Payer: Self-pay

## 2019-10-14 DIAGNOSIS — Z01812 Encounter for preprocedural laboratory examination: Secondary | ICD-10-CM | POA: Diagnosis not present

## 2019-10-14 DIAGNOSIS — Z20822 Contact with and (suspected) exposure to covid-19: Secondary | ICD-10-CM | POA: Diagnosis not present

## 2019-10-15 LAB — SARS CORONAVIRUS 2 (TAT 6-24 HRS): SARS Coronavirus 2: NEGATIVE

## 2019-10-16 ENCOUNTER — Ambulatory Visit (HOSPITAL_COMMUNITY)
Admission: RE | Admit: 2019-10-16 | Discharge: 2019-10-16 | Disposition: A | Discharge status: Home / Self Care | Payer: Medicare Other | Attending: Internal Medicine | Admitting: Internal Medicine

## 2019-10-16 ENCOUNTER — Encounter (HOSPITAL_COMMUNITY): Payer: Self-pay | Admitting: Internal Medicine

## 2019-10-16 ENCOUNTER — Other Ambulatory Visit: Payer: Self-pay

## 2019-10-16 ENCOUNTER — Encounter (HOSPITAL_COMMUNITY)
Admission: RE | Disposition: A | Discharge status: Home / Self Care | Payer: Self-pay | Source: Home / Self Care | Attending: Internal Medicine

## 2019-10-16 DIAGNOSIS — K3189 Other diseases of stomach and duodenum: Secondary | ICD-10-CM | POA: Insufficient documentation

## 2019-10-16 DIAGNOSIS — Z885 Allergy status to narcotic agent status: Secondary | ICD-10-CM | POA: Diagnosis not present

## 2019-10-16 DIAGNOSIS — R1310 Dysphagia, unspecified: Secondary | ICD-10-CM | POA: Diagnosis not present

## 2019-10-16 DIAGNOSIS — Z8249 Family history of ischemic heart disease and other diseases of the circulatory system: Secondary | ICD-10-CM | POA: Insufficient documentation

## 2019-10-16 DIAGNOSIS — Z79899 Other long term (current) drug therapy: Secondary | ICD-10-CM | POA: Diagnosis not present

## 2019-10-16 DIAGNOSIS — K319 Disease of stomach and duodenum, unspecified: Secondary | ICD-10-CM | POA: Diagnosis not present

## 2019-10-16 DIAGNOSIS — Z9049 Acquired absence of other specified parts of digestive tract: Secondary | ICD-10-CM | POA: Diagnosis not present

## 2019-10-16 DIAGNOSIS — M8580 Other specified disorders of bone density and structure, unspecified site: Secondary | ICD-10-CM | POA: Insufficient documentation

## 2019-10-16 DIAGNOSIS — K209 Esophagitis, unspecified without bleeding: Secondary | ICD-10-CM

## 2019-10-16 HISTORY — PX: ESOPHAGOGASTRODUODENOSCOPY: SHX5428

## 2019-10-16 HISTORY — PX: BIOPSY: SHX5522

## 2019-10-16 HISTORY — PX: MALONEY DILATION: SHX5535

## 2019-10-16 SURGERY — EGD (ESOPHAGOGASTRODUODENOSCOPY)
Anesthesia: Moderate Sedation

## 2019-10-16 MED ORDER — LIDOCAINE VISCOUS HCL 2 % MT SOLN
OROMUCOSAL | Status: AC
  Filled 2019-10-16: qty 15

## 2019-10-16 MED ORDER — MIDAZOLAM HCL 5 MG/5ML IJ SOLN
INTRAMUSCULAR | Status: DC | PRN
  Administered 2019-10-16: 1 mg via INTRAVENOUS
  Administered 2019-10-16 (×2): 2 mg via INTRAVENOUS

## 2019-10-16 MED ORDER — ONDANSETRON HCL 4 MG/2ML IJ SOLN
INTRAMUSCULAR | Status: DC | PRN
  Administered 2019-10-16: 4 mg via INTRAVENOUS

## 2019-10-16 MED ORDER — MEPERIDINE HCL 50 MG/ML IJ SOLN
INTRAMUSCULAR | Status: AC
  Filled 2019-10-16: qty 1

## 2019-10-16 MED ORDER — LIDOCAINE VISCOUS HCL 2 % MT SOLN
OROMUCOSAL | Status: DC | PRN
  Administered 2019-10-16: 1 via OROMUCOSAL

## 2019-10-16 MED ORDER — ONDANSETRON HCL 4 MG/2ML IJ SOLN
INTRAMUSCULAR | Status: AC
  Filled 2019-10-16: qty 2

## 2019-10-16 MED ORDER — MEPERIDINE HCL 100 MG/ML IJ SOLN
INTRAMUSCULAR | Status: DC | PRN
  Administered 2019-10-16: 15 mg
  Administered 2019-10-16: 25 mg

## 2019-10-16 MED ORDER — SODIUM CHLORIDE 0.9 % IV SOLN
INTRAVENOUS | Status: DC
  Administered 2019-10-16: 20 mL/h via INTRAVENOUS

## 2019-10-16 MED ORDER — MIDAZOLAM HCL 5 MG/5ML IJ SOLN
INTRAMUSCULAR | Status: AC
  Filled 2019-10-16: qty 5

## 2019-10-16 NOTE — Discharge Instructions (Signed)

## 2019-10-16 NOTE — H&P (Signed)
@LOGO @   Primary Care Physician:  Susy Frizzle, MD Primary Gastroenterologist:  Dr. Gala Romney  Pre-Procedure History & Physical: HPI:  Rebecca Osborne is a 69 y.o. female here for further evaluation of the recurrent esophageal dysphagia via EGD.  History of Schatzki's ring dilated 10 years ago.  Reflux currently treated with Pepcid 20 mg daily.  No PPI due to C. difficile last year.  Past Medical History:  Diagnosis Date  . Allergy   . Cancer (HCC)    cervical cancer cells  . GERD (gastroesophageal reflux disease)   . Osteopenia     Past Surgical History:  Procedure Laterality Date  . ABDOMINAL HYSTERECTOMY    . BOWEL RESECTION N/A 06/22/2016   Procedure: SMALL BOWEL RESECTION;  Surgeon: Vickie Epley, MD;  Location: [facility];  Service: General;  Laterality: N/A;  . CHOLECYSTECTOMY  2005  . COLONOSCOPY  08/23/2019   Tortuous left colon, external and internal hemorrhoids  . COLONOSCOPY N/A 08/23/2019   Procedure: COLONOSCOPY;  Surgeon: Danie Binder, MD;  Location: [facility];  Service: Endoscopy;  Laterality: N/A;  8:30  . COLONOSCOPY WITH ESOPHAGOGASTRODUODENOSCOPY (EGD)  09/2009   Fields: Normal colonoscopy.  H. pylori gastritis.  Schatzki ring status post esophageal dilation.  Next colonoscopy 10 years  . FRACTURE SURGERY     rt foot  . LAPAROTOMY N/A 06/22/2016   Procedure: EXPLORATORY LAPAROTOMY;  Surgeon: Vickie Epley, MD;  Location: [facility];  Service: General;  Laterality: N/A;  . LYSIS OF ADHESION N/A 06/22/2016   Procedure: LYSIS OF ADHESION;  Surgeon: Vickie Epley, MD;  Location: [facility];  Service: General;  Laterality: N/A;    Prior to Admission medications   Medication Sig Start Date End Date Taking? Authorizing Provider  Calcium Carb-Cholecalciferol (CALCIUM 600+D3 PO) Take 2 tablets by mouth daily.   Yes [provider]  Cholecalciferol (VITAMIN D) 50 MCG (2000 UT) tablet Take 2,000 Units by mouth daily.   Yes [provider]   DULoxetine (CYMBALTA) 30 MG capsule TAKE 1 CAPSULE BY MOUTH EVERY DAY 10/10/19  Yes Trula Slade, DPM  famotidine (PEPCID) 20 MG tablet Take 1 tablet (20 mg total) by mouth daily. 08/28/19  Yes Mahala Menghini, PA-C  HM SUPER VITAMIN B12 2500 MCG CHEW Chew 2,500 mcg by mouth daily.   Yes [provider]  ibuprofen (ADVIL) 200 MG tablet Take 200-400 mg by mouth every 6 (six) hours as needed (for pain.).   Yes [provider]    Allergies as of 08/28/2019 - Review Complete 08/28/2019  Allergen Reaction Noted  . Codeine Itching     Family History  Problem Relation Age of Onset  . COPD Mother   . Heart disease Mother   . Miscarriages / [place] Mother   . Vision loss Mother        from shingles  . COPD Father   . Cancer Brother        leukemia  . Early death Brother   . Colon cancer Neg Hx   . Colon polyps Neg Hx   . Inflammatory bowel disease Neg Hx     Social History   Socioeconomic History  . Marital status: Widowed    Spouse name: Not on file  . Number of children: Not on file  . Years of education: Not on file  . Highest education level: Not on file  Occupational History  . Not on file  Tobacco Use  . Smoking status:  Never Smoker  . Smokeless tobacco: Never Used  Substance and Sexual Activity  . Alcohol use: No  . Drug use: No  . Sexual activity: Not Currently  Other Topics Concern  . Not on file  Social History Narrative  . Not on file   Social Determinants of Health   Financial Resource Strain:   . Difficulty of Paying Living Expenses:   Food Insecurity:   . Worried About Charity fundraiser in the Last Year:   . Arboriculturist in the Last Year:   Transportation Needs:   . Film/video editor (Medical):   Marland Kitchen Lack of Transportation (Non-Medical):   Physical Activity:   . Days of Exercise per Week:   . Minutes of Exercise per Session:   Stress:   . Feeling of Stress :   Social Connections:   . Frequency of Communication  with Friends and Family:   . Frequency of Social Gatherings with Friends and Family:   . Attends Religious Services:   . Active Member of Clubs or Organizations:   . Attends Archivist Meetings:   Marland Kitchen Marital Status:   Intimate Partner Violence:   . Fear of Current or Ex-Partner:   . Emotionally Abused:   Marland Kitchen Physically Abused:   . Sexually Abused:     Review of Systems: See HPI, otherwise negative ROS  Physical Exam: There were no vitals taken for this visit. General:   Alert,  Well-developed, well-nourished, pleasant and cooperative in NAD  Mouth:  No deformity or lesions. Neck:  Supple; no masses or thyromegaly. No significant cervical adenopathy. Lungs:  Clear throughout to auscultation.   No wheezes, crackles, or rhonchi. No acute distress. Heart:  Regular rate and rhythm; no murmurs, clicks, rubs,  or gallops. Abdomen: Non-distended, normal bowel sounds.  Soft and nontender without appreciable mass or hepatosplenomegaly.    Impression/Plan: 69 year old lady with esophageal dysphagia.  History of known Schatzki's ring.  I have offered  the patient an EGD with esophageal dilation as feasible/appropriate per plan.    Notice: This dictation was prepared with Dragon dictation along with smaller phrase technology. Any transcriptional errors that result from this process are unintentional and may not be corrected upon review.

## 2019-10-16 NOTE — Op Note (Addendum)
[facility] Patient Name: Rebecca Osborne Procedure Date: 10/16/2019 10:05 AM MRN: [id number] Date of Birth: [birth year] Attending MD: Gennette Pac , MD CSN: [id number] Age: 69 Admit Type: Outpatient Procedure:                Upper GI endoscopy Indications:              Dysphagia Providers:                Gennette Pac, MD, Jannett Celestine, RN, Edythe Clarity, Technician Referring MD:              Medicines:                Meperidine 40 mg IV, Midazolam 4 mg IV, Ondansetron                            4 mg IV Complications:            No immediate complications. Estimated Blood Loss:     Estimated blood loss was minimal. Procedure:                Pre-Anesthesia Assessment:                           - Prior to the procedure, a History and Physical                            was performed, and patient medications and                            allergies were reviewed. The patient's tolerance of                            previous anesthesia was also reviewed. The risks                            and benefits of the procedure and the sedation                            options and risks were discussed with the patient.                            All questions were answered, and informed consent                            was obtained. Prior Anticoagulants: The patient has                            taken no previous anticoagulant or antiplatelet                            agents. ASA Grade Assessment: II - A patient with  mild systemic disease. After reviewing the risks                            and benefits, the patient was deemed in                            satisfactory condition to undergo the procedure.                           After obtaining informed consent, the endoscope was                            passed under direct vision. Throughout the                            procedure, the patient's blood pressure, pulse,  and                            oxygen saturations were monitored continuously. The                            GIF-H190 ([id number]) scope was introduced through the                            mouth, and advanced to the second part of duodenum.                            The upper GI endoscopy was accomplished without                            difficulty. The patient tolerated the procedure                            well. Scope In: 10:27:14 AM Scope Out: 10:37:33 AM Total Procedure Duration: 0 hours 10 minutes 19 seconds  Findings:      Esophagitis was found. 2 cm linear erosion straddling the GE junction.       Tubular esophagus appeared patent throughout its course. No Barrett's       epithelium or tumor seen.      Scattered gastric erosions found. No ulcer or infiltrating process.       Small hiatal hernia. Pylorus patent.      Normal-appearing bulb and second portion aside from a single moderate       size D2 diverticulum The scope was withdrawn. Dilation was performed       with a Maloney dilator with no resistance at 54 Fr. attempted to pass a       56 French Maloney dilator but I could not get it advanced beyond the       hypopharynx. Finally, biopsies of the abnormal gastric mucosa taken. Impression:               Erosive reflux esophagitis. Status post [facility]                            dilation. Hiatal hernia. Gastric erosions status  post biopsy. Duodenal diverticulum.                           - Moderate Sedation:      Moderate (conscious) sedation was administered by the endoscopy nurse       and supervised by the endoscopist. The following parameters were       monitored: oxygen saturation, heart rate, blood pressure, respiratory       rate, EKG, adequacy of pulmonary ventilation, and response to care.       Total physician intraservice time was 18 minutes. Recommendation:           - Patient has a contact number available for                             emergencies. The signs and symptoms of potential                            delayed complications were discussed with the                            patient. Return to normal activities tomorrow.                            Written discharge instructions were provided to the                            patient.                           - Advance diet as tolerated.                           - Continue present medications. However, Pepcid                            inadequate in this situation. Begin AcipHex 20 mg                            daily prescription provided. Benefits far outweigh                            the risks.                           - Await pathology results.                           - Return to GI clinic in 3 months. Procedure Code(s):        --- Professional ---                           [phone], Esophagogastroduodenoscopy, flexible,                            transoral; diagnostic, including collection of  specimen(s) by brushing or washing, when performed                            (separate procedure)                           43450, Dilation of esophagus, by unguided sound or                            bougie, single or multiple passes                           G0500, Moderate sedation services provided by the                            same physician or other qualified health care                            professional performing a gastrointestinal                            endoscopic service that sedation supports,                            requiring the presence of an independent trained                            observer to assist in the monitoring of the                            patient's level of consciousness and physiological                            status; initial 15 minutes of intra-service time;                            patient age 47 years or older (additional time may                            be reported with  [phone], as appropriate) Diagnosis Code(s):        --- Professional ---                           K20.90, Esophagitis, unspecified without bleeding                           K31.89, Other diseases of stomach and duodenum                           R13.10, Dysphagia, unspecified CPT copyright 2019 American Medical Association. All rights reserved. The codes documented in this report are preliminary and upon coder review may  be revised to meet current compliance requirements. Gerrit Friends. Zelphia Glover, MD Gennette Pac, MD 10/16/2019 11:03:26 AM This report has been signed electronically. Number of Addenda: 0

## 2019-10-17 ENCOUNTER — Other Ambulatory Visit: Payer: Self-pay

## 2019-10-17 LAB — SURGICAL PATHOLOGY

## 2019-10-18 ENCOUNTER — Encounter: Payer: Self-pay | Admitting: Internal Medicine

## 2019-12-11 ENCOUNTER — Encounter: Payer: Self-pay | Admitting: Gastroenterology

## 2020-01-01 ENCOUNTER — Other Ambulatory Visit: Payer: Self-pay | Admitting: Podiatry

## 2020-01-20 ENCOUNTER — Ambulatory Visit: Payer: Medicare Other | Admitting: Gastroenterology

## 2020-02-05 ENCOUNTER — Encounter: Payer: Self-pay | Admitting: Gastroenterology

## 2020-02-05 ENCOUNTER — Ambulatory Visit (INDEPENDENT_AMBULATORY_CARE_PROVIDER_SITE_OTHER): Payer: Medicare Other | Admitting: Gastroenterology

## 2020-02-05 ENCOUNTER — Other Ambulatory Visit: Payer: Self-pay

## 2020-02-05 VITALS — BP 131/70 | HR 64 | Temp 97.1°F | Ht 61.0 in | Wt 141.2 lb

## 2020-02-05 DIAGNOSIS — K2100 Gastro-esophageal reflux disease with esophagitis, without bleeding: Secondary | ICD-10-CM

## 2020-02-05 NOTE — Progress Notes (Signed)
Primary Care Physician: Susy Frizzle, MD  Primary Gastroenterologist:  Elon Alas. Abbey Chatters, DO   Chief Complaint  Patient presents with  . Dysphagia    pp f/u, doing ok    HPI: Rebecca Osborne is a 69 y.o. female here for follow-up.  She was seen back in March for dysphagia.  Endoscopy in May showed erosive reflux esophagitis, esophagus was dilated.  She also had gastric erosions, biopsy showed mild inflammation but no H. Pylori.  Doing well.  Dysphagia resolved.  Reflux much better controlled on AcipHex 20 mg daily. No heartburn, vomiting, abdominal pain, weight loss. BMs regular. No melena, brbpr.   Current Outpatient Medications  Medication Sig Dispense Refill  . Calcium Carb-Cholecalciferol (CALCIUM 600+D3 PO) Take 1 tablet by mouth daily.     . Cholecalciferol (VITAMIN D) 50 MCG (2000 UT) tablet Take 2,000 Units by mouth daily.    . DULoxetine (CYMBALTA) 30 MG capsule TAKE 1 CAPSULE BY MOUTH EVERY DAY 90 capsule 0  . HM SUPER VITAMIN B12 2500 MCG CHEW Chew 2,500 mcg by mouth daily.    Marland Kitchen ibuprofen (ADVIL) 200 MG tablet Take 200-400 mg by mouth every 6 (six) hours as needed (for pain.).    Marland Kitchen RABEprazole (ACIPHEX) 20 MG tablet Take 20 mg by mouth daily.     No current facility-administered medications for this visit.    Allergies as of 02/05/2020 - Review Complete 02/05/2020  Allergen Reaction Noted  . Codeine Itching     ROS:  General: Negative for anorexia, weight loss, fever, chills, fatigue, weakness. ENT: Negative for hoarseness, difficulty swallowing , nasal congestion. CV: Negative for chest pain, angina, palpitations, dyspnea on exertion, peripheral edema.  Respiratory: Negative for dyspnea at rest, dyspnea on exertion, cough, sputum, wheezing.  GI: See history of present illness. GU:  Negative for dysuria, hematuria, urinary incontinence, urinary frequency, nocturnal urination.  Endo: Negative for unusual weight change.    Physical Examination:   BP  131/70   Pulse 64   Temp (!) 97.1 F (36.2 C) (Temporal)   Ht 5' 1"  (1.549 m)   Wt 141 lb 3.2 oz (64 kg)   BMI 26.68 kg/m   General: Well-nourished, well-developed in no acute distress.  Eyes: No icterus. Mouth: masked Lungs: Clear to auscultation bilaterally.  Heart: Regular rate and rhythm, no murmurs rubs or gallops.  Abdomen: Bowel sounds are normal, nontender, nondistended, no hepatosplenomegaly or masses, no abdominal bruits or hernia , no rebound or guarding.   Extremities: No lower extremity edema. No clubbing or deformities. Neuro: Alert and oriented x 4   Skin: Warm and dry, no jaundice.   Psych: Alert and cooperative, normal mood and affect.  Labs:  Lab Results  Component Value Date   CREATININE 1.00 06/20/2019   BUN 21 06/20/2019   NA 141 06/20/2019   K 5.0 06/20/2019   CL 103 06/20/2019   CO2 26 06/20/2019   Lab Results  Component Value Date   WBC 6.0 06/20/2019   HGB 12.3 06/20/2019   HCT 36.3 06/20/2019   MCV 87 06/20/2019   PLT 311 06/20/2019     Imaging Studies: No results found.  Impression/Plan:  69 y/o female with GERD presenting for follow up post-EGD.   Dysphagia: resolved after esophageal dilation.   GERD: erosive reflux esophagitis on EGD. Switched from Pepcid to Aciphex at time of EGD. Clinically much improved. Continue Aciphex 85m daily before breakfast. Reinforced antireflux measures. Return to the office in  one year or sooner if needed.

## 2020-02-05 NOTE — Patient Instructions (Addendum)
1. Continue rabeprazole 5m daily for reflux.  2. Return to the office in one year or call sooner if needed.     Gastroesophageal Reflux Disease, Adult Gastroesophageal reflux (GER) happens when acid from the stomach flows up into the tube that connects the mouth and the stomach (esophagus). Normally, food travels down the esophagus and stays in the stomach to be digested. However, when a person has GER, food and stomach acid sometimes move back up into the esophagus. If this becomes a more serious problem, the person may be diagnosed with a disease called gastroesophageal reflux disease (GERD). GERD occurs when the reflux:  Happens often.  Causes frequent or severe symptoms.  Causes problems such as damage to the esophagus. When stomach acid comes in contact with the esophagus, the acid may cause soreness (inflammation) in the esophagus. Over time, GERD may create small holes (ulcers) in the lining of the esophagus. What are the causes? This condition is caused by a problem with the muscle between the esophagus and the stomach (lower esophageal sphincter, or LES). Normally, the LES muscle closes after food passes through the esophagus to the stomach. When the LES is weakened or abnormal, it does not close properly, and that allows food and stomach acid to go back up into the esophagus. The LES can be weakened by certain dietary substances, medicines, and medical conditions, including:  Tobacco use.  Pregnancy.  Having a hiatal hernia.  Alcohol use.  Certain foods and beverages, such as coffee, chocolate, onions, and peppermint. What increases the risk? You are more likely to develop this condition if you:  Have an increased body weight.  Have a connective tissue disorder.  Use NSAID medicines. What are the signs or symptoms? Symptoms of this condition include:  Heartburn.  Difficult or painful swallowing.  The feeling of having a lump in the throat.  Abitter taste in  the mouth.  Bad breath.  Having a large amount of saliva.  Having an upset or bloated stomach.  Belching.  Chest pain. Different conditions can cause chest pain. Make sure you see your health care provider if you experience chest pain.  Shortness of breath or wheezing.  Ongoing (chronic) cough or a night-time cough.  Wearing away of tooth enamel.  Weight loss. How is this diagnosed? Your health care provider will take a medical history and perform a physical exam. To determine if you have mild or severe GERD, your health care provider may also monitor how you respond to treatment. You may also have tests, including:  A test to examine your stomach and esophagus with a small camera (endoscopy).  A test thatmeasures the acidity level in your esophagus.  A test thatmeasures how much pressure is on your esophagus.  A barium swallow or modified barium swallow test to show the shape, size, and functioning of your esophagus. How is this treated? The goal of treatment is to help relieve your symptoms and to prevent complications. Treatment for this condition may vary depending on how severe your symptoms are. Your health care provider may recommend:  Changes to your diet.  Medicine.  Surgery. Follow these instructions at home: Eating and drinking   Follow a diet as recommended by your health care provider. This may involve avoiding foods and drinks such as: ? Coffee and tea (with or without caffeine). ? Drinks that containalcohol. ? Energy drinks and sports drinks. ? Carbonated drinks or sodas. ? Chocolate and cocoa. ? Peppermint and mint flavorings. ? Garlic and onions. ?  Horseradish. ? Spicy and acidic foods, including peppers, chili powder, curry powder, vinegar, hot sauces, and barbecue sauce. ? Citrus fruit juices and citrus fruits, such as oranges, lemons, and limes. ? Tomato-based foods, such as red sauce, chili, salsa, and pizza with red sauce. ? Fried and  fatty foods, such as donuts, french fries, potato chips, and high-fat dressings. ? High-fat meats, such as hot dogs and fatty cuts of red and white meats, such as rib eye steak, sausage, ham, and bacon. ? High-fat dairy items, such as whole milk, butter, and cream cheese.  Eat small, frequent meals instead of large meals.  Avoid drinking large amounts of liquid with your meals.  Avoid eating meals during the 2-3 hours before bedtime.  Avoid lying down right after you eat.  Do not exercise right after you eat. Lifestyle   Do not use any products that contain nicotine or tobacco, such as cigarettes, e-cigarettes, and chewing tobacco. If you need help quitting, ask your health care provider.  Try to reduce your stress by using methods such as yoga or meditation. If you need help reducing stress, ask your health care provider.  If you are overweight, reduce your weight to an amount that is healthy for you. Ask your health care provider for guidance about a safe weight loss goal. General instructions  Pay attention to any changes in your symptoms.  Take over-the-counter and prescription medicines only as told by your health care provider. Do not take aspirin, ibuprofen, or other NSAIDs unless your health care provider told you to do so.  Wear loose-fitting clothing. Do not wear anything tight around your waist that causes pressure on your abdomen.  Raise (elevate) the head of your bed about 6 inches (15 cm).  Avoid bending over if this makes your symptoms worse.  Keep all follow-up visits as told by your health care provider. This is important. Contact a health care provider if:  You have: ? New symptoms. ? Unexplained weight loss. ? Difficulty swallowing or it hurts to swallow. ? Wheezing or a persistent cough. ? A hoarse voice.  Your symptoms do not improve with treatment. Get help right away if you:  Have pain in your arms, neck, jaw, teeth, or back.  Feel sweaty, dizzy,  or light-headed.  Have chest pain or shortness of breath.  Vomit and your vomit looks like blood or coffee grounds.  Faint.  Have stool that is bloody or black.  Cannot swallow, drink, or eat. Summary  Gastroesophageal reflux happens when acid from the stomach flows up into the esophagus. GERD is a disease in which the reflux happens often, causes frequent or severe symptoms, or causes problems such as damage to the esophagus.  Treatment for this condition may vary depending on how severe your symptoms are. Your health care provider may recommend diet and lifestyle changes, medicine, or surgery.  Contact a health care provider if you have new or worsening symptoms.  Take over-the-counter and prescription medicines only as told by your health care provider. Do not take aspirin, ibuprofen, or other NSAIDs unless your health care provider told you to do so.  Keep all follow-up visits as told by your health care provider. This is important. This information is not intended to replace advice given to you by your health care provider. Make sure you discuss any questions you have with your health care provider. Document Revised: 11/29/2017 Document Reviewed: 11/29/2017 Elsevier Patient Education  2020 [place] for Gastroesophageal  Reflux Disease, Adult When you have gastroesophageal reflux disease (GERD), the foods you eat and your eating habits are very important. Choosing the right foods can help ease the discomfort of GERD. Consider working with a diet and nutrition specialist (dietitian) to help you make healthy food choices. What general guidelines should I follow?  Eating plan  Choose healthy foods low in fat, such as fruits, vegetables, whole grains, low-fat dairy products, and lean meat, fish, and poultry.  Eat frequent, small meals instead of three large meals each day. Eat your meals slowly, in a relaxed setting. Avoid bending over or lying down until 2-3  hours after eating.  Limit high-fat foods such as fatty meats or fried foods.  Limit your intake of oils, butter, and shortening to less than 8 teaspoons each day.  Avoid the following: ? Foods that cause symptoms. These may be different for different people. Keep a food diary to keep track of foods that cause symptoms. ? Alcohol. ? Drinking large amounts of liquid with meals. ? Eating meals during the 2-3 hours before bed.  Cook foods using methods other than frying. This may include baking, grilling, or broiling. Lifestyle  Maintain a healthy weight. Ask your health care provider what weight is healthy for you. If you need to lose weight, work with your health care provider to do so safely.  Exercise for at least 30 minutes on 5 or more days each week, or as told by your health care provider.  Avoid wearing clothes that fit tightly around your waist and chest.  Do not use any products that contain nicotine or tobacco, such as cigarettes and e-cigarettes. If you need help quitting, ask your health care provider.  Sleep with the head of your bed raised. Use a wedge under the mattress or blocks under the bed frame to raise the head of the bed. What foods are not recommended? The items listed may not be a complete list. Talk with your dietitian about what dietary choices are best for you. Grains Pastries or quick breads with added fat. [place] toast. Vegetables Deep fried vegetables. [place] fries. Any vegetables prepared with added fat. Any vegetables that cause symptoms. For some people this may include tomatoes and tomato products, chili peppers, onions and garlic, and horseradish. Fruits Any fruits prepared with added fat. Any fruits that cause symptoms. For some people this may include citrus fruits, such as oranges, grapefruit, pineapple, and lemons. Meats and other protein foods High-fat meats, such as fatty beef or pork, hot dogs, ribs, ham, sausage, salami and bacon. Fried meat  or protein, including fried fish and fried chicken. Nuts and nut butters. Dairy Whole milk and chocolate milk. Sour cream. Cream. Ice cream. Cream cheese. Milk shakes. Beverages Coffee and tea, with or without caffeine. Carbonated beverages. Sodas. Energy drinks. Fruit juice made with acidic fruits (such as orange or grapefruit). Tomato juice. Alcoholic drinks. Fats and oils Butter. Margarine. Shortening. Ghee. Sweets and desserts Chocolate and cocoa. Donuts. Seasoning and other foods Pepper. Peppermint and spearmint. Any condiments, herbs, or seasonings that cause symptoms. For some people, this may include curry, hot sauce, or vinegar-based salad dressings. Summary  When you have gastroesophageal reflux disease (GERD), food and lifestyle choices are very important to help ease the discomfort of GERD.  Eat frequent, small meals instead of three large meals each day. Eat your meals slowly, in a relaxed setting. Avoid bending over or lying down until 2-3 hours after eating.  Limit high-fat foods such as  fatty meat or fried foods. This information is not intended to replace advice given to you by your health care provider. Make sure you discuss any questions you have with your health care provider. Document Revised: 09/13/2018 Document Reviewed: 05/24/2016 Elsevier Patient Education  [place].

## 2020-02-21 ENCOUNTER — Other Ambulatory Visit: Payer: Self-pay

## 2020-02-21 ENCOUNTER — Ambulatory Visit (INDEPENDENT_AMBULATORY_CARE_PROVIDER_SITE_OTHER): Payer: Medicare Other | Admitting: Family Medicine

## 2020-02-21 VITALS — BP 100/68 | HR 80 | Temp 98.1°F | Resp 18 | Wt 140.0 lb

## 2020-02-21 DIAGNOSIS — J069 Acute upper respiratory infection, unspecified: Secondary | ICD-10-CM | POA: Diagnosis not present

## 2020-02-21 MED ORDER — AZITHROMYCIN 250 MG PO TABS
ORAL_TABLET | ORAL | 0 refills | Status: DC
Start: 2020-02-21 — End: 2020-10-16

## 2020-02-21 NOTE — Progress Notes (Signed)
Subjective:    Patient ID: Rebecca Osborne, female    DOB: [birth year], 69 y.o.   MRN: [id number]  HPI Patient reports a 4-day history of cough which is nonproductive, head congestion, rhinorrhea, and sore throat.  She has not had her Covid vaccine.  She denies any chest pain or shortness of breath or pleurisy or hemoptysis.  She denies any nausea or vomiting.  She denies any loss of sense of smell or taste. Past Medical History:  Diagnosis Date  . Allergy   . Cancer (HCC)    cervical cancer cells  . GERD (gastroesophageal reflux disease)   . Osteopenia    Past Surgical History:  Procedure Laterality Date  . ABDOMINAL HYSTERECTOMY    . BIOPSY  10/16/2019   Procedure: BIOPSY;  Surgeon: Daneil Dolin, MD;  Location: [facility];  Service: Endoscopy;;  . BOWEL RESECTION N/A 06/22/2016   Procedure: SMALL BOWEL RESECTION;  Surgeon: Vickie Epley, MD;  Location: [facility];  Service: General;  Laterality: N/A;  . CHOLECYSTECTOMY  2005  . COLONOSCOPY  08/23/2019   Tortuous left colon, external and internal hemorrhoids  . COLONOSCOPY N/A 08/23/2019   Procedure: COLONOSCOPY;  Surgeon: Danie Binder, MD;  Location: [facility];  Service: Endoscopy;  Laterality: N/A;  8:30  . COLONOSCOPY WITH ESOPHAGOGASTRODUODENOSCOPY (EGD)  09/2009   Fields: Normal colonoscopy.  H. pylori gastritis.  Schatzki ring status post esophageal dilation.  Next colonoscopy 10 years  . ESOPHAGOGASTRODUODENOSCOPY N/A 10/16/2019   Rourk: Erosive reflux esophagitis, gastric erosions, duodenal diverticulum.  Biopsies from the stomach showed mild inflammation.  No H. pylori.  . FRACTURE SURGERY     rt foot  . LAPAROTOMY N/A 06/22/2016   Procedure: EXPLORATORY LAPAROTOMY;  Surgeon: Vickie Epley, MD;  Location: [facility];  Service: General;  Laterality: N/A;  . LYSIS OF ADHESION N/A 06/22/2016   Procedure: LYSIS OF ADHESION;  Surgeon: Vickie Epley, MD;  Location: [facility];  Service: General;  Laterality: N/A;  Venia Minks  DILATION N/A 10/16/2019   Procedure: Keturah Shavers;  Surgeon: Daneil Dolin, MD;  Location: [facility];  Service: Endoscopy;  Laterality: N/A;   Current Outpatient Medications on File Prior to Visit  Medication Sig Dispense Refill  . Calcium Carb-Cholecalciferol (CALCIUM 600+D3 PO) Take 1 tablet by mouth daily.     . Cholecalciferol (VITAMIN D) 50 MCG (2000 UT) tablet Take 2,000 Units by mouth daily.    . DULoxetine (CYMBALTA) 30 MG capsule TAKE 1 CAPSULE BY MOUTH EVERY DAY 90 capsule 0  . HM SUPER VITAMIN B12 2500 MCG CHEW Chew 2,500 mcg by mouth daily.    Marland Kitchen ibuprofen (ADVIL) 200 MG tablet Take 200-400 mg by mouth every 6 (six) hours as needed (for pain.).    Marland Kitchen RABEprazole (ACIPHEX) 20 MG tablet Take 20 mg by mouth daily.     No current facility-administered medications on file prior to visit.   Allergies  Allergen Reactions  . Codeine Itching   Social History   Socioeconomic History  . Marital status: Widowed    Spouse name: Not on file  . Number of children: Not on file  . Years of education: Not on file  . Highest education level: Not on file  Occupational History  . Not on file  Tobacco Use  . Smoking status: Never Smoker  . Smokeless tobacco: Never Used  Vaping Use  . Vaping Use: Never used  Substance and Sexual Activity  . Alcohol  use: No  . Drug use: No  . Sexual activity: Not Currently  Other Topics Concern  . Not on file  Social History Narrative  . Not on file   Social Determinants of Health   Financial Resource Strain:   . Difficulty of Paying Living Expenses: Not on file  Food Insecurity:   . Worried About Charity fundraiser in the Last Year: Not on file  . Ran Out of Food in the Last Year: Not on file  Transportation Needs:   . Lack of Transportation (Medical): Not on file  . Lack of Transportation (Non-Medical): Not on file  Physical Activity:   . Days of Exercise per Week: Not on file  . Minutes of Exercise per Session: Not on file    Stress:   . Feeling of Stress : Not on file  Social Connections:   . Frequency of Communication with Friends and Family: Not on file  . Frequency of Social Gatherings with Friends and Family: Not on file  . Attends Religious Services: Not on file  . Active Member of Clubs or Organizations: Not on file  . Attends Archivist Meetings: Not on file  . Marital Status: Not on file  Intimate Partner Violence:   . Fear of Current or Ex-Partner: Not on file  . Emotionally Abused: Not on file  . Physically Abused: Not on file  . Sexually Abused: Not on file      Review of Systems  Respiratory: Positive for cough.   All other systems reviewed and are negative.      Objective:   Physical Exam Vitals reviewed.  Constitutional:      General: She is not in acute distress.    Appearance: Normal appearance. She is not ill-appearing or toxic-appearing.  HENT:     Right Ear: Tympanic membrane and ear canal normal.     Left Ear: Tympanic membrane and ear canal normal.  Cardiovascular:     Rate and Rhythm: Normal rate and regular rhythm.     Heart sounds: Normal heart sounds. No murmur heard.   Pulmonary:     Effort: Pulmonary effort is normal. No respiratory distress.     Breath sounds: No stridor. Rales present. No wheezing or rhonchi.  Abdominal:     General: Abdomen is flat. Bowel sounds are normal. There is no distension.     Palpations: Abdomen is soft. There is no mass.     Tenderness: There is no abdominal tenderness. There is no guarding or rebound.  Musculoskeletal:     Right lower leg: No edema.     Left lower leg: No edema.  Lymphadenopathy:     Cervical: No cervical adenopathy.  Neurological:     Mental Status: She is alert.           Assessment & Plan:  URI, acute - Plan: SARS-COV-2 RNA,(COVID-19) QUAL NAAT  Patient has prominent rales on the lower right side and also rhonchorous breath sounds on that side concerning for possible pneumonia.  Patient  has not had her Covid vaccine and I am concerned that she may have Covid pneumonia.  I will perform a Covid test today.  I recommended quarantining at home until the test returns.  I will treat the patient for possible community-acquired pneumonia with Z-Pak until the test returns.  If positive for Covid she will need to cancel the Z-Pak and consider Regeneron.

## 2020-02-22 LAB — SARS-COV-2 RNA,(COVID-19) QUALITATIVE NAAT: SARS CoV2 RNA: NOT DETECTED

## 2020-02-24 ENCOUNTER — Other Ambulatory Visit: Payer: Self-pay | Admitting: Gastroenterology

## 2020-03-13 ENCOUNTER — Other Ambulatory Visit: Payer: Self-pay | Admitting: Internal Medicine

## 2020-04-02 ENCOUNTER — Other Ambulatory Visit: Payer: Self-pay | Admitting: Podiatry

## 2020-04-02 NOTE — Telephone Encounter (Signed)
Please Advise

## 2020-05-15 ENCOUNTER — Other Ambulatory Visit (HOSPITAL_COMMUNITY): Payer: Self-pay | Admitting: Family Medicine

## 2020-05-15 DIAGNOSIS — Z1231 Encounter for screening mammogram for malignant neoplasm of breast: Secondary | ICD-10-CM

## 2020-05-28 ENCOUNTER — Other Ambulatory Visit: Payer: Self-pay

## 2020-05-28 ENCOUNTER — Ambulatory Visit (HOSPITAL_COMMUNITY)
Admission: RE | Admit: 2020-05-28 | Discharge: 2020-05-28 | Disposition: A | Discharge status: Home / Self Care | Payer: Medicare Other | Source: Ambulatory Visit | Attending: Family Medicine | Admitting: Family Medicine

## 2020-05-28 DIAGNOSIS — Z1231 Encounter for screening mammogram for malignant neoplasm of breast: Secondary | ICD-10-CM

## 2020-06-29 ENCOUNTER — Other Ambulatory Visit: Payer: Self-pay | Admitting: Podiatry

## 2020-07-15 ENCOUNTER — Emergency Department (HOSPITAL_COMMUNITY)
Admission: EM | Admit: 2020-07-15 | Discharge: 2020-07-15 | Disposition: A | Discharge status: Home / Self Care | Payer: Medicare Other | Attending: Emergency Medicine | Admitting: Emergency Medicine

## 2020-07-15 ENCOUNTER — Encounter (HOSPITAL_COMMUNITY): Payer: Self-pay | Admitting: Emergency Medicine

## 2020-07-15 ENCOUNTER — Other Ambulatory Visit: Payer: Self-pay

## 2020-07-15 ENCOUNTER — Emergency Department (HOSPITAL_COMMUNITY): Payer: Medicare Other

## 2020-07-15 DIAGNOSIS — K529 Noninfective gastroenteritis and colitis, unspecified: Secondary | ICD-10-CM | POA: Insufficient documentation

## 2020-07-15 DIAGNOSIS — Z90711 Acquired absence of uterus with remaining cervical stump: Secondary | ICD-10-CM | POA: Insufficient documentation

## 2020-07-15 DIAGNOSIS — Z8541 Personal history of malignant neoplasm of cervix uteri: Secondary | ICD-10-CM | POA: Insufficient documentation

## 2020-07-15 DIAGNOSIS — K5900 Constipation, unspecified: Secondary | ICD-10-CM | POA: Diagnosis not present

## 2020-07-15 DIAGNOSIS — R109 Unspecified abdominal pain: Secondary | ICD-10-CM | POA: Diagnosis not present

## 2020-07-15 DIAGNOSIS — K219 Gastro-esophageal reflux disease without esophagitis: Secondary | ICD-10-CM | POA: Diagnosis not present

## 2020-07-15 DIAGNOSIS — R1032 Left lower quadrant pain: Secondary | ICD-10-CM | POA: Diagnosis present

## 2020-07-15 DIAGNOSIS — Z9049 Acquired absence of other specified parts of digestive tract: Secondary | ICD-10-CM | POA: Insufficient documentation

## 2020-07-15 LAB — CBC WITH DIFFERENTIAL/PLATELET
Abs Immature Granulocytes: 0.02 10*3/uL (ref 0.00–0.07)
Basophils Absolute: 0 10*3/uL (ref 0.0–0.1)
Basophils Relative: 1 %
Eosinophils Absolute: 0.2 10*3/uL (ref 0.0–0.5)
Eosinophils Relative: 3 %
HCT: 35.6 % — ABNORMAL LOW (ref 36.0–46.0)
Hemoglobin: 11.6 g/dL — ABNORMAL LOW (ref 12.0–15.0)
Immature Granulocytes: 0 %
Lymphocytes Relative: 29 %
Lymphs Abs: 1.8 10*3/uL (ref 0.7–4.0)
MCH: 30.1 pg (ref 26.0–34.0)
MCHC: 32.6 g/dL (ref 30.0–36.0)
MCV: 92.5 fL (ref 80.0–100.0)
Monocytes Absolute: 0.5 10*3/uL (ref 0.1–1.0)
Monocytes Relative: 8 %
Neutro Abs: 3.8 10*3/uL (ref 1.7–7.7)
Neutrophils Relative %: 59 %
Platelets: 181 10*3/uL (ref 150–400)
RBC: 3.85 MIL/uL — ABNORMAL LOW (ref 3.87–5.11)
RDW: 12.9 % (ref 11.5–15.5)
WBC: 6.4 10*3/uL (ref 4.0–10.5)
nRBC: 0 % (ref 0.0–0.2)

## 2020-07-15 LAB — COMPREHENSIVE METABOLIC PANEL
ALT: 25 U/L (ref 0–44)
AST: 24 U/L (ref 15–41)
Albumin: 3.8 g/dL (ref 3.5–5.0)
Alkaline Phosphatase: 87 U/L (ref 38–126)
Anion gap: 10 (ref 5–15)
BUN: 23 mg/dL (ref 8–23)
CO2: 22 mmol/L (ref 22–32)
Calcium: 8.9 mg/dL (ref 8.9–10.3)
Chloride: 106 mmol/L (ref 98–111)
Creatinine, Ser: 0.8 mg/dL (ref 0.44–1.00)
GFR, Estimated: >60 mL/min (ref >60)
Glucose, Bld: 88 mg/dL (ref 70–99)
Potassium: 3.6 mmol/L (ref 3.5–5.1)
Sodium: 138 mmol/L (ref 135–145)
Total Bilirubin: 0.4 mg/dL (ref 0.3–1.2)
Total Protein: 7.1 g/dL (ref 6.5–8.1)

## 2020-07-15 LAB — URINALYSIS, ROUTINE W REFLEX MICROSCOPIC
Bilirubin Urine: NEGATIVE
Glucose, UA: NEGATIVE mg/dL
Hgb urine dipstick: NEGATIVE
Ketones, ur: NEGATIVE mg/dL
Leukocytes,Ua: NEGATIVE
Nitrite: NEGATIVE
Protein, ur: NEGATIVE mg/dL
Specific Gravity, Urine: >1.046 — ABNORMAL HIGH (ref 1.005–1.030)
pH: 5 (ref 5.0–8.0)

## 2020-07-15 LAB — LIPASE, BLOOD: Lipase: 42 U/L (ref 11–51)

## 2020-07-15 MED ORDER — AMOXICILLIN-POT CLAVULANATE 875-125 MG PO TABS
1.0000 | ORAL_TABLET | Freq: Two times a day (BID) | ORAL | 0 refills | Status: DC
Start: 2020-07-15 — End: 2020-10-16

## 2020-07-15 MED ORDER — IOHEXOL 300 MG/ML  SOLN
100.0000 mL | Freq: Once | INTRAMUSCULAR | Status: AC | PRN
  Administered 2020-07-15: 100 mL via INTRAVENOUS

## 2020-07-15 MED ORDER — POLYETHYLENE GLYCOL 3350 17 G PO PACK: 17.0000 g | PACK | Freq: Every day | ORAL | 0 refills | Status: DC | PRN

## 2020-07-15 MED ORDER — AMOXICILLIN-POT CLAVULANATE 875-125 MG PO TABS
1.0000 | ORAL_TABLET | Freq: Once | ORAL | Status: AC
  Administered 2020-07-15: 1 via ORAL
  Filled 2020-07-15: qty 1

## 2020-07-15 MED ORDER — ONDANSETRON HCL 4 MG/2ML IJ SOLN
4.0000 mg | Freq: Once | INTRAMUSCULAR | Status: AC
  Administered 2020-07-15: 4 mg via INTRAVENOUS
  Filled 2020-07-15: qty 2

## 2020-07-15 NOTE — ED Notes (Signed)
Pt to CT

## 2020-07-15 NOTE — ED Notes (Signed)
Back to room from CT

## 2020-07-15 NOTE — ED Provider Notes (Signed)
[facility] EMERGENCY DEPARTMENT Provider Note   CSN: [id number] Arrival date & time: 07/15/20  1357     History Chief Complaint  Patient presents with  . Abdominal Pain    Rebecca Osborne is a 70 y.o. female with a history of GERD, history of colitis, small bowel obstruction secondary to adhesions, surgical history significant for total hysterectomy and partial small bowel resection secondary to adhesions, cholecystectomy and surgery for lysis of adhesions presenting for evaluation of a 3-day history of increasing abdominal pain and constipation.  She describes low abdominal pain midline with radiation into the left lower quadrant.  She also endorses abdominal distention but denies nausea or vomiting, also has been passing flatus, her stools have been harder than normal but has had bowel movements daily, last BM was this morning.  She denies seeing blood in her stools.  She has had no fevers or chills.  She has had no treatments for symptoms prior to arrival.  HPI     Past Medical History:  Diagnosis Date  . Allergy   . Cancer (HCC)    cervical cancer cells  . GERD (gastroesophageal reflux disease)   . Osteopenia     Patient Active Problem List   Diagnosis Date Noted  . Esophageal dysphagia 08/28/2019  . Colon cancer screening   . Hypomagnesemia 04/29/2019  . C. difficile colitis 04/17/2019  . Hypokalemia   . Colitis   . Diarrhea of presumed infectious origin   . 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 Normocytic anemia 03/11/2019  . Complex regional pain syndrome type 1 of right lower extremity 10/22/2018  . Small bowel obstruction due to adhesions ([place]) 06/22/2016  . GERD 08/31/2009  . DYSPHAGIA UNSPECIFIED 08/31/2009    Past Surgical History:  Procedure Laterality Date  . ABDOMINAL HYSTERECTOMY    . BIOPSY  10/16/2019   Procedure: BIOPSY;  Surgeon: Daneil Dolin, MD;  Location: [facility] ENDO SUITE;  Service: Endoscopy;;  . BOWEL RESECTION N/A 06/22/2016   Procedure: SMALL BOWEL  RESECTION;  Surgeon: Vickie Epley, MD;  Location: [facility] ORS;  Service: General;  Laterality: N/A;  . CHOLECYSTECTOMY  2005  . COLONOSCOPY  08/23/2019   Tortuous left colon, external and internal hemorrhoids  . COLONOSCOPY N/A 08/23/2019   Procedure: COLONOSCOPY;  Surgeon: Danie Binder, MD;  Location: [facility] END[facility];  Service: Endoscopy;  Laterality: N/A;  8:30  . COLONOSCOPY WITH ESOPHAGOGASTRODUODENOSCOPY (EGD)  09/2009   Fields: Normal colonoscopy.  H. pylori gastritis.  Schatzki ring status post esophageal dilation.  Next colonoscopy 10 years  . ESOPHAGOGASTRODUODENOSCOPY N/A 10/16/2019   Rourk: Erosive reflux esophagitis, gastric erosions, duodenal diverticulum.  Biopsies from the stomach showed mild inflammation.  No H. pylori.  . FRACTURE SURGERY     rt foot  . LAPAROTOMY N/A 06/22/2016   Procedure: EXPLORATORY LAPAROTOMY;  Surgeon: Vickie Epley, MD;  Location: [facility] ORS;  Service: General;  Laterality: N/A;  . LYSIS OF ADHESION N/A 06/22/2016   Procedure: LYSIS OF ADHESION;  Surgeon: Vickie Epley, MD;  Location: [facility] ORS;  Service: General;  Laterality: N/A;  Venia Minks DILATION N/A 10/16/2019   Procedure: Keturah Shavers;  Surgeon: Daneil Dolin, MD;  Location: [facility];  Service: Endoscopy;  Laterality: N/A;     OB History    Gravida      Para      Term      Preterm      AB      Living  3  SAB      IAB      Ectopic      Multiple      Live Births              Family History  Problem Relation Age of Onset  . COPD Mother   . Heart disease Mother   . Miscarriages / [place] Mother   . Vision loss Mother        from shingles  . COPD Father   . Cancer Brother        leukemia  . Early death Brother   . Colon cancer Neg Hx   . Colon polyps Neg Hx   . Inflammatory bowel disease Neg Hx     Social History   Tobacco Use  . Smoking status: Never Smoker  . Smokeless tobacco: Never Used  Vaping Use  . Vaping Use: Never used  Substance  Use Topics  . Alcohol use: No  . Drug use: No    Home Medications Prior to Admission medications   Medication Sig Start Date End Date Taking? Authorizing Provider  amoxicillin-clavulanate (AUGMENTIN) 875-125 MG tablet Take 1 tablet by mouth every 12 (twelve) hours. 07/15/20  Yes Chauntae Hults, Almyra Free, PA-C  Calcium Carb-Cholecalciferol (CALCIUM 600+D3 PO) Take 1 tablet by mouth daily.    Yes [provider]  Cholecalciferol (VITAMIN D) 50 MCG (2000 UT) tablet Take 2,000 Units by mouth daily.   Yes [provider]  DULoxetine (CYMBALTA) 30 MG capsule TAKE 1 CAPSULE BY MOUTH EVERY DAY 06/29/20  Yes Trula Slade, DPM  famotidine (PEPCID) 20 MG tablet TAKE 1 TABLET BY MOUTH EVERY DAY 02/24/20  Yes Annitta Needs, NP  HM SUPER VITAMIN B12 2500 MCG CHEW Chew 2,500 mcg by mouth daily.   Yes [provider]  ibuprofen (ADVIL) 200 MG tablet Take 200-400 mg by mouth every 6 (six) hours as needed (for pain.).   Yes [provider]  polyethylene glycol (MIRALAX) 17 g packet Take 17 g by mouth daily as needed for moderate constipation. 07/15/20  Yes Taytum Wheller, Almyra Free, PA-C  RABEprazole (ACIPHEX) 20 MG tablet TAKE (1) TABLET BY MOUTH ONCE DAILY. Patient taking differently: Take 20 mg by mouth daily. 03/16/20  Yes Carlis Stable, NP  azithromycin (ZITHROMAX) 250 MG tablet 2 tabs poqday1, 1 tab poqday 2-5 Patient not taking: Reported on 07/15/2020 02/21/20   Susy Frizzle, MD    Allergies    Codeine  Review of Systems   Review of Systems  Constitutional: Negative for chills and fever.  HENT: Negative for congestion and sore throat.   Eyes: Negative.   Respiratory: Negative for chest tightness and shortness of breath.   Cardiovascular: Negative for chest pain.  Gastrointestinal: Positive for abdominal distention, abdominal pain and constipation. Negative for nausea.  Genitourinary: Negative.  Negative for dysuria.  Musculoskeletal: Negative for arthralgias, joint swelling and neck  pain.  Skin: Negative.  Negative for rash and wound.  Neurological: Negative for dizziness, weakness, light-headedness, numbness and headaches.  Psychiatric/Behavioral: Negative.   All other systems reviewed and are negative.   Physical Exam Updated Vital Signs BP 130/78   Pulse 82   Temp 98.3 F (36.8 C) (Oral)   Resp 16   Ht _0  (1.549 m)   Wt 63.5 kg   SpO2 99%   BMI 26.45 kg/m   Physical Exam Vitals and nursing note reviewed.  Constitutional:      Appearance: She is well-developed and well-nourished.  HENT:  Head: Normocephalic and atraumatic.  Eyes:     Conjunctiva/sclera: Conjunctivae normal.  Cardiovascular:     Rate and Rhythm: Normal rate and regular rhythm.     Pulses: Intact distal pulses.     Heart sounds: Normal heart sounds.  Pulmonary:     Effort: Pulmonary effort is normal.     Breath sounds: Normal breath sounds. No wheezing.  Abdominal:     General: Bowel sounds are normal.     Palpations: Abdomen is soft.     Tenderness: There is abdominal tenderness in the suprapubic area and left lower quadrant. There is no guarding or rebound.     Hernia: No hernia is present.     Comments: Soft distention without guarding or rebound.  No increased tympany to percussion.  Musculoskeletal:        General: Normal range of motion.     Cervical back: Normal range of motion.  Skin:    General: Skin is warm and dry.  Neurological:     Mental Status: She is alert.  Psychiatric:        Mood and Affect: Mood and affect normal.     ED Results / Procedures / Treatments   Labs (all labs ordered are listed, but only abnormal results are displayed) Labs Reviewed  CBC WITH DIFFERENTIAL/PLATELET - Abnormal; Notable for the following components:      Result Value   RBC 3.85 (*)    Hemoglobin 11.6 (*)    HCT 35.6 (*)    All other components within normal limits  COMPREHENSIVE METABOLIC PANEL  LIPASE, BLOOD  CBC WITH DIFFERENTIAL/PLATELET  URINALYSIS, ROUTINE  W REFLEX MICROSCOPIC    EKG None  Radiology CT ABDOMEN PELVIS W CONTRAST  Result Date: 07/15/2020 CLINICAL DATA:  Upper abdominal pain radiating into back for several days, swelling EXAM: CT ABDOMEN AND PELVIS WITH CONTRAST TECHNIQUE: Multidetector CT imaging of the abdomen and pelvis was performed using the standard protocol following bolus administration of intravenous contrast. CONTRAST:  115m OMNIPAQUE IOHEXOL 300 MG/ML  SOLN COMPARISON:  04/15/2019 FINDINGS: Lower chest: No acute pleural or parenchymal lung disease. Hepatobiliary: No focal liver abnormality is seen. Status post cholecystectomy. No biliary dilatation. Pancreas: Unremarkable. No pancreatic ductal dilatation or surrounding inflammatory changes. Spleen: Normal in size without focal abnormality. Adrenals/Urinary Tract: Stable right renal cortical scarring. Small bilateral renal cortical cysts are again noted and stable. No urinary tract calculi or obstructive uropathy within either kidney. Bladder is moderately distended without focal abnormality. The adrenals are normal. Stomach/Bowel: No bowel obstruction or ileus. Mild wall thickening within the cecum, though less pronounced than on prior study. Mild pericecal fat stranding, with subcentimeter mesenteric lymph nodes likely reactive. Normal appendix right lower quadrant. Vascular/Lymphatic: Aortic atherosclerosis. Multiple subcentimeter lymph nodes in the right lower quadrant mesentery are likely reactive. No pathologically enlarged lymph nodes. Reproductive: Status post hysterectomy. No adnexal masses. Other: No free fluid or free gas.  No abdominal wall hernia. Musculoskeletal: No acute or destructive bony lesions. Reconstructed images demonstrate no additional findings. IMPRESSION: 1. Mild cecal wall thickening and inflammatory changes consistent with colitis. 2.  Aortic Atherosclerosis (ICD10-I70.0). Electronically Signed   By: MRanda NgoM.D.   On: 07/15/2020 17:42     Procedures Procedures   Medications Ordered in ED Medications  amoxicillin-clavulanate (AUGMENTIN) 875-125 MG per tablet 1 tablet (has no administration in time range)  ondansetron (ZOFRAN) injection 4 mg (4 mg Intravenous Given 07/15/20 1556)  iohexol (OMNIPAQUE) 300 MG/ML solution 100 mL (100 mLs  Intravenous Contrast Given 07/15/20 1720)    ED Course  I have reviewed the triage vital signs and the nursing notes.  Pertinent labs & imaging results that were available during my care of the patient were reviewed by me and considered in my medical decision making (see chart for details).    MDM Rules/Calculators/A&P                          Patient CT skin is reassuring, no SBO present, she does have mild colitis, blood work is reassuring with a normal WBC count, c-Met with normal findings, CBC with a borderline hemoglobin at 11.6.  Review of chart indicates her hemoglobin has ranged from 9.1-12.3 over the past year.  We will treat for colitis, patient started on Augmentin also advised MiraLAX for constipation and close f/u  With pcp for any persistent or worsened sx.  Return precautions outlined.  Final Clinical Impression(s) / ED Diagnoses Final diagnoses:  Colitis  Constipation, unspecified constipation type    Rx / DC Orders ED Discharge Orders         Ordered    amoxicillin-clavulanate (AUGMENTIN) 875-125 MG tablet  Every 12 hours        07/15/20 1954    polyethylene glycol (MIRALAX) 17 g packet  Daily PRN        07/15/20 1954           Evalee Jefferson, Hershal Coria 07/15/20 Sonia Baller, MD 07/18/20 1328

## 2020-07-15 NOTE — ED Provider Notes (Signed)
  Patient signed out to me by Evalee Jefferson, PA-C pending urinalysis result.  Patient here with 3-day history of abdominal pain,  Work up earlier included CT abdomen and pelvis that shows colitis.   She has being treated with Augmentin.  Urinalysis without evidence of infection.  She is well-appearing and nontoxic.  She agrees to follow-up with PCP  Labs Reviewed  URINALYSIS, ROUTINE W REFLEX MICROSCOPIC - Abnormal; Notable for the following components:      Result Value   Color, Urine STRAW (*)    Specific Gravity, Urine >1.046 (*)    All other components within normal limits  CBC WITH DIFFERENTIAL/PLATELET - Abnormal; Notable for the following components:   RBC 3.85 (*)    Hemoglobin 11.6 (*)    HCT 35.6 (*)    All other components within normal limits  COMPREHENSIVE METABOLIC PANEL  LIPASE, BLOOD  CBC WITH DIFFERENTIAL/PLATELET      Kem Parkinson, PA-C 07/15/20 2118    Varney Biles, MD 07/18/20 1329

## 2020-07-15 NOTE — ED Triage Notes (Signed)
Pt c/o abd pain for 3 days and lower back pain that started yesterday.

## 2020-07-15 NOTE — Discharge Instructions (Addendum)
Take the entire course of the antibiotic prescribed.  I also recommend starting MiraLAX to help you with constipation.  You will need a recheck of your symptoms if not improving with this treatment.  Call Dr. Cletus Gash for recheck within the next several days.  However if you have any worsening symptoms including worse pain, if you develop vomiting or any fever return here immediately for recheck of your symptoms.  Make sure you are drinking plenty of fluids to avoid dehydration.  I recommend a clear liquid diet for the next 24 hours, which you may increase slowly to a normal diet if your symptoms are improving.Marland Kitchen

## 2020-10-16 ENCOUNTER — Encounter: Payer: Self-pay | Admitting: Nurse Practitioner

## 2020-10-16 ENCOUNTER — Other Ambulatory Visit: Payer: Self-pay

## 2020-10-16 ENCOUNTER — Telehealth (INDEPENDENT_AMBULATORY_CARE_PROVIDER_SITE_OTHER): Payer: Medicare Other | Admitting: Nurse Practitioner

## 2020-10-16 DIAGNOSIS — J069 Acute upper respiratory infection, unspecified: Secondary | ICD-10-CM | POA: Diagnosis not present

## 2020-10-16 MED ORDER — SALINE SPRAY 0.65 % NA SOLN: 1.0000 | NASAL | 0 refills | Status: DC | PRN

## 2020-10-16 MED ORDER — PREDNISONE 20 MG PO TABS: 40.0000 mg | ORAL_TABLET | Freq: Every day | ORAL | 0 refills | Status: AC

## 2020-10-16 MED ORDER — GUAIFENESIN ER 600 MG PO TB12: 600.0000 mg | ORAL_TABLET | Freq: Two times a day (BID) | ORAL | 0 refills | Status: DC | PRN

## 2020-10-16 NOTE — Progress Notes (Signed)
Subjective:    Patient ID: Rebecca Osborne, female    DOB: 1950-10-19, 70 y.o.   MRN: [id number]  HPI: Rebecca Osborne is a 70 y.o. female presenting via parking lot visit for cough and congestion.  Chief Complaint  Patient presents with  . Cough   UPPER RESPIRATORY TRACT INFECTION Onset:  ~ 2 week ago COVID testing history: not tested this occurrence. COVID vaccination status: not vaccinated Fever: no Cough: yes; congested Shortness of breath: no Wheezing: yes; worse at night time and has been having to prop pillow under head at night time Chest pain: no Chest tightness: no Chest congestion: yes Nasal congestion: no Runny nose: yes Post nasal drip: yes Sneezing: yes Sore throat: yes Swollen glands: yes Sinus pressure: yes; maxillary Headache: no Face pain: no Toothache: no Ear pain: no  Ear pressure: no  Eyes red/itching:no Eye drainage/crusting: no  Nausea: no  Vomiting: no Diarrhea: no  Change in appetite: no  Loss of taste/smell: no  Rash: no Fatigue: no Sick contacts: no Strep contacts: no  Context: fluctuating Recurrent sinusitis: no Treatments attempted: nothing tried Relief with OTC medications: nothing tried  Allergies  Allergen Reactions  . Codeine Itching    Outpatient Encounter Medications as of 10/16/2020  Medication Sig  . guaiFENesin (MUCINEX) 600 MG 12 hr tablet Take 1 tablet (600 mg total) by mouth 2 (two) times daily as needed for cough or to loosen phlegm.  . predniSONE (DELTASONE) 20 MG tablet Take 2 tablets (40 mg total) by mouth daily with breakfast for 5 days.  . sodium chloride (OCEAN) 0.65 % SOLN nasal spray Place 1 spray into both nostrils as needed for congestion.  . Calcium Carb-Cholecalciferol (CALCIUM 600+D3 PO) Take 1 tablet by mouth daily.   . Cholecalciferol (VITAMIN D) 50 MCG (2000 UT) tablet Take 2,000 Units by mouth daily.  . DULoxetine (CYMBALTA) 30 MG capsule TAKE 1 CAPSULE BY MOUTH EVERY DAY  . famotidine (PEPCID) 20  MG tablet TAKE 1 TABLET BY MOUTH EVERY DAY  . HM SUPER VITAMIN B12 2500 MCG CHEW Chew 2,500 mcg by mouth daily.  Marland Kitchen ibuprofen (ADVIL) 200 MG tablet Take 200-400 mg by mouth every 6 (six) hours as needed (for pain.).  Marland Kitchen polyethylene glycol (MIRALAX) 17 g packet Take 17 g by mouth daily as needed for moderate constipation.  . RABEprazole (ACIPHEX) 20 MG tablet TAKE (1) TABLET BY MOUTH ONCE DAILY. (Patient taking differently: Take 20 mg by mouth daily.)  . [DISCONTINUED] amoxicillin-clavulanate (AUGMENTIN) 875-125 MG tablet Take 1 tablet by mouth every 12 (twelve) hours.  . [DISCONTINUED] azithromycin (ZITHROMAX) 250 MG tablet 2 tabs poqday1, 1 tab poqday 2-5 (Patient not taking: Reported on 07/15/2020)   No facility-administered encounter medications on file as of 10/16/2020.    Patient Active Problem List   Diagnosis Date Noted  . Esophageal dysphagia 08/28/2019  . Colon cancer screening   . Hypomagnesemia 04/29/2019  . C. difficile colitis 04/17/2019  . Hypokalemia   . Colitis   . Diarrhea of presumed infectious origin   . 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 Normocytic anemia 03/11/2019  . Complex regional pain syndrome type 1 of right lower extremity 10/22/2018  . Small bowel obstruction due to adhesions ([place]) 06/22/2016  . GERD 08/31/2009  . DYSPHAGIA UNSPECIFIED 08/31/2009    Past Medical History:  Diagnosis Date  . Allergy   . Cancer (HCC)    cervical cancer cells  . GERD (gastroesophageal reflux disease)   . Osteopenia  Relevant past medical, surgical, family and social history reviewed and updated as indicated. Interim medical history since our last visit reviewed.  Review of Systems Per HPI unless specifically indicated above     Objective:    There were no vitals taken for this visit.  Wt Readings from Last 3 Encounters:  07/15/20 140 lb (63.5 kg)  02/21/20 140 lb (63.5 kg)  02/05/20 141 lb 3.2 oz (64 kg)    Physical Exam Nursing note reviewed.  Constitutional:       General: She is not in acute distress.    Appearance: Normal appearance. She is not toxic-appearing.  HENT:     Head: Normocephalic and atraumatic.     Right Ear: External ear normal.     Left Ear: External ear normal.     Nose: Nose normal. No congestion.     Mouth/Throat:     Mouth: Mucous membranes are moist.     Pharynx: Oropharynx is clear. No posterior oropharyngeal erythema.  Eyes:     General: No scleral icterus.       Right eye: No discharge.        Left eye: No discharge.  Cardiovascular:     Rate and Rhythm: Normal rate.     Heart sounds: Normal heart sounds. No murmur heard. No gallop.   Pulmonary:     Effort: Pulmonary effort is normal. No respiratory distress.     Breath sounds: Normal breath sounds. No wheezing, rhonchi or rales.  Abdominal:     General: Abdomen is flat. Bowel sounds are normal.     Palpations: Abdomen is soft.  Musculoskeletal:     Cervical back: Normal range of motion and neck supple.  Lymphadenopathy:     Cervical: No cervical adenopathy.  Skin:    General: Skin is warm and dry.     Capillary Refill: Capillary refill takes less than 2 seconds.     Coloration: Skin is not jaundiced or pale.     Findings: No erythema.  Neurological:     Mental Status: She is alert and oriented to person, place, and time.  Psychiatric:        Mood and Affect: Mood normal.        Behavior: Behavior normal.        Thought Content: Thought content normal.        Judgment: Judgment normal.       Assessment & Plan:  1. Upper respiratory tract infection, unspecified type Acute. No maxillary tenderness, no purulent nasal drainage.  Lungs clear to auscultation bilaterally.  Low suspicion for bacterial infection.  Will obtain viral testing and start Mucinex, prednisone burst, and ocean nasal spray.  If symptoms not improved over weekend, consider further work up in office. With any sudden onset new chest pain, dizziness, sweating, or shortness of breath, go to  ED.  - SARS-CoV-2 RNA (COVID-19) and Respiratory Viral Panel, Qualitative NAAT - guaiFENesin (MUCINEX) 600 MG 12 hr tablet; Take 1 tablet (600 mg total) by mouth 2 (two) times daily as needed for cough or to loosen phlegm.  Dispense: 30 tablet; Refill: 0 - predniSONE (DELTASONE) 20 MG tablet; Take 2 tablets (40 mg total) by mouth daily with breakfast for 5 days.  Dispense: 10 tablet; Refill: 0 - sodium chloride (OCEAN) 0.65 % SOLN nasal spray; Place 1 spray into both nostrils as needed for congestion.  Dispense: 88 mL; Refill: 0    Follow up plan: Return if symptoms worsen or fail to improve.

## 2020-10-19 ENCOUNTER — Telehealth: Payer: Self-pay

## 2020-10-20 LAB — SARS-COV-2 RNA (COVID-19) RESP VIRAL PNL QL NAAT

## 2020-10-20 NOTE — Telephone Encounter (Signed)
No action required closing closing to sign encounter

## 2020-10-21 ENCOUNTER — Telehealth: Payer: Self-pay | Admitting: Family Medicine

## 2020-10-21 NOTE — Telephone Encounter (Signed)
Patient called to follow up on results of recent tests. Please advise at [phone].

## 2020-10-22 ENCOUNTER — Telehealth: Payer: Self-pay | Admitting: Family Medicine

## 2020-10-22 ENCOUNTER — Ambulatory Visit (HOSPITAL_COMMUNITY)
Admission: RE | Admit: 2020-10-22 | Discharge: 2020-10-22 | Disposition: A | Discharge status: Home / Self Care | Payer: Medicare Other | Source: Ambulatory Visit | Attending: Nurse Practitioner | Admitting: Nurse Practitioner

## 2020-10-22 ENCOUNTER — Telehealth: Payer: Self-pay

## 2020-10-22 ENCOUNTER — Other Ambulatory Visit: Payer: Self-pay

## 2020-10-22 DIAGNOSIS — R059 Cough, unspecified: Secondary | ICD-10-CM | POA: Diagnosis not present

## 2020-10-22 DIAGNOSIS — J069 Acute upper respiratory infection, unspecified: Secondary | ICD-10-CM

## 2020-10-22 NOTE — Telephone Encounter (Signed)
This pt has been sent for xray before anymore meds will be given

## 2020-10-22 NOTE — Telephone Encounter (Signed)
Let's have her get a chest x-ray - please order and inform patient.  Want to make sure we are not missing pneumonia.  Is is still a congested cough?

## 2020-10-22 NOTE — Telephone Encounter (Signed)
Patient called to request refill or different Rx; symptoms lingering (still coughing)  Requesting refill of predniSONE (DELTASONE) 20 MG tablet [[id number]]  Pharmacy confirmed as   [id number] [place], [place] [id number] Phone:  [phone]:  [phone] DEA #:  [id number] Please advise patient at [phone]

## 2020-10-22 NOTE — Telephone Encounter (Signed)
Done, pt will go to [facility] for imaging

## 2020-10-23 ENCOUNTER — Telehealth: Payer: Self-pay

## 2020-10-23 MED ORDER — BENZONATATE 100 MG PO CAPS: 100.0000 mg | ORAL_CAPSULE | Freq: Two times a day (BID) | ORAL | 0 refills | Status: DC | PRN

## 2020-10-23 NOTE — Telephone Encounter (Signed)
Patient called to verify pharmacy as   [id number] [place], [place] [id number] Phone:  [phone]:  [phone] DEA #:  [id number]  Please contact patient at [phone] Rx called in.

## 2020-11-11 ENCOUNTER — Telehealth: Payer: Self-pay | Admitting: Family Medicine

## 2020-11-11 NOTE — Telephone Encounter (Signed)
Patient walked into office requesting visit for persistent cough (cough medicine not working) and issue with leg swelling (possible blood clot). Patient advised we don't have any availability today and advised to go to ED or nearest urgent care center. Patient stated ED doesn't accept her insurance; scheduled an appointment to see provider on 6/16 (first available)

## 2020-11-19 ENCOUNTER — Encounter: Payer: Self-pay | Admitting: Family Medicine

## 2020-11-19 ENCOUNTER — Other Ambulatory Visit: Payer: Self-pay

## 2020-11-19 ENCOUNTER — Ambulatory Visit (INDEPENDENT_AMBULATORY_CARE_PROVIDER_SITE_OTHER): Payer: Medicare Other | Admitting: Family Medicine

## 2020-11-19 VITALS — BP 122/68 | HR 80 | Temp 97.9°F | Resp 16 | Ht 61.0 in | Wt 147.0 lb

## 2020-11-19 DIAGNOSIS — I872 Venous insufficiency (chronic) (peripheral): Secondary | ICD-10-CM

## 2020-11-19 NOTE — Progress Notes (Signed)
Subjective:    Patient ID: Rebecca Osborne, female    DOB: Oct 20, 1950, 70 y.o.   MRN: [id number]  Patient is concerned that she has a blood clot in her legs specifically her left leg.  She states that both of her legs have been swelling more recently.  She has trace edema in her right foot and trace edema in her left foot.  She has a negative Homans' sign bilaterally.  There is no appreciable swelling.  There is a slight indentation where she wears her socks which is minimal.  She has no pain in her legs.  There is no erythema or warmth.  She denies any recent surgery.  She denies any recent immobilization or plane flight.  She denies any cancer.  She is not taking any estrogen or hormone therapy.  She has no family history of blood clots.  She was concerned because she felt that her left leg was swelling more.  On physical exam, she does have trace bipedal edema.  She also has numerous small varicose veins all over her legs indicating underlying chronic venous insufficiency Past Medical History:  Diagnosis Date   Allergy    Cancer ([place])    cervical cancer cells   GERD (gastroesophageal reflux disease)    Osteopenia    Past Surgical History:  Procedure Laterality Date   ABDOMINAL HYSTERECTOMY     BIOPSY  10/16/2019   Procedure: BIOPSY;  Surgeon: Daneil Dolin, MD;  [facility];  Service: Endoscopy;;   BOWEL RESECTION N/A 06/22/2016   Procedure: SMALL BOWEL RESECTION;  Surgeon: Vickie Epley, MD;  Location: [facility];  Service: General;  Laterality: N/A;   CHOLECYSTECTOMY  2005   COLONOSCOPY  08/23/2019   Tortuous left colon, external and internal hemorrhoids   COLONOSCOPY N/A 08/23/2019   Procedure: COLONOSCOPY;  Surgeon: Danie Binder, MD;  [facility];  Service: Endoscopy;  Laterality: N/A;  8:30   COLONOSCOPY WITH ESOPHAGOGASTRODUODENOSCOPY (EGD)  09/2009   Fields: Normal colonoscopy.  H. pylori gastritis.  Schatzki ring status post esophageal dilation.  Next  colonoscopy 10 years   ESOPHAGOGASTRODUODENOSCOPY N/A 10/16/2019   Rourk: Erosive reflux esophagitis, gastric erosions, duodenal diverticulum.  Biopsies from the stomach showed mild inflammation.  No H. pylori.   FRACTURE SURGERY     rt foot   LAPAROTOMY N/A 06/22/2016   Procedure: EXPLORATORY LAPAROTOMY;  Surgeon: Vickie Epley, MD;  Location: [facility];  Service: General;  Laterality: N/A;   LYSIS OF ADHESION N/A 06/22/2016   Procedure: LYSIS OF ADHESION;  Surgeon: Vickie Epley, MD;  Location: [facility];  Service: General;  Laterality: N/A;   MALONEY DILATION N/A 10/16/2019   Procedure: Venia Minks DILATION;  Surgeon: Daneil Dolin, MD;  [facility];  Service: Endoscopy;  Laterality: N/A;   Current Outpatient Medications on File Prior to Visit  Medication Sig Dispense Refill   Calcium Carb-Cholecalciferol (CALCIUM 600+D3 PO) Take 1 tablet by mouth daily.      Cholecalciferol (VITAMIN D) 50 MCG (2000 UT) tablet Take 2,000 Units by mouth daily.     famotidine (PEPCID) 20 MG tablet TAKE 1 TABLET BY MOUTH EVERY DAY 90 tablet 3   HM SUPER VITAMIN B12 2500 MCG CHEW Chew 2,500 mcg by mouth daily.     ibuprofen (ADVIL) 200 MG tablet Take 200-400 mg by mouth every 6 (six) hours as needed (for pain.).     RABEprazole (ACIPHEX) 20 MG tablet TAKE (1) TABLET  BY MOUTH ONCE DAILY. (Patient taking differently: Take 20 mg by mouth daily.) 30 tablet 5   DULoxetine (CYMBALTA) 30 MG capsule TAKE 1 CAPSULE BY MOUTH EVERY DAY (Patient not taking: Reported on 11/19/2020) 90 capsule 0   No current facility-administered medications on file prior to visit.   Allergies  Allergen Reactions   Codeine Itching   Social History   Socioeconomic History   Marital status: Widowed    Spouse name: Not on file   Number of children: Not on file   Years of education: Not on file   Highest education level: Not on file  Occupational History   Not on file  Tobacco Use   Smoking status: Never   Smokeless  tobacco: Never  Vaping Use   Vaping Use: Never used  Substance and Sexual Activity   Alcohol use: No   Drug use: No   Sexual activity: Not Currently  Other Topics Concern   Not on file  Social History Narrative   Not on file   Social Determinants of Health   Financial Resource Strain: Not on file  Food Insecurity: Not on file  Transportation Needs: Not on file  Physical Activity: Not on file  Stress: Not on file  Social Connections: Not on file  Intimate Partner Violence: Not on file      Review of Systems  Respiratory:  Positive for cough.   All other systems reviewed and are negative.     Objective:   Physical Exam Vitals reviewed.  Constitutional:      General: She is not in acute distress.    Appearance: Normal appearance. She is not ill-appearing or toxic-appearing.  Cardiovascular:     Rate and Rhythm: Normal rate and regular rhythm.     Pulses: Normal pulses.     Heart sounds: Normal heart sounds. No murmur heard.   No friction rub.  Pulmonary:     Effort: Pulmonary effort is normal. No respiratory distress.     Breath sounds: No stridor. No wheezing, rhonchi or rales.  Abdominal:     General: Abdomen is flat. Bowel sounds are normal.     Palpations: Abdomen is soft.     Tenderness: There is no abdominal tenderness. There is no rebound.  Musculoskeletal:     Right lower leg: No edema.     Left lower leg: No edema.  Lymphadenopathy:     Cervical: No cervical adenopathy.  Skin:    Findings: No bruising, erythema, lesion or rash.  Neurological:     Mental Status: She is alert.          Assessment & Plan:  Chronic venous insufficiency I believe the small amount of swelling is more consistent with chronic venous insufficiency.  I recommended wearing compression hose on both legs to help manage this.  There is no significant unilateral asymmetric edema.  Furthermore she has no risk factors.  She has a negative Homans' sign.  Therefore I try to  reassure the patient that I do not feel that she has a blood clot.  Of note the swelling has been a chronic gradual problem that has been occurring for quite some time.  This is not a sudden change.

## 2020-11-30 IMAGING — DX DG FOOT COMPLETE 3+V*R*
3 series · 3 of 3 positions shown · non-contrast
Comparison: 05/01/2018

CLINICAL DATA: Right foot pain,

EXAM:
RIGHT FOOT COMPLETE - 3+ VIEW

[foot ap]
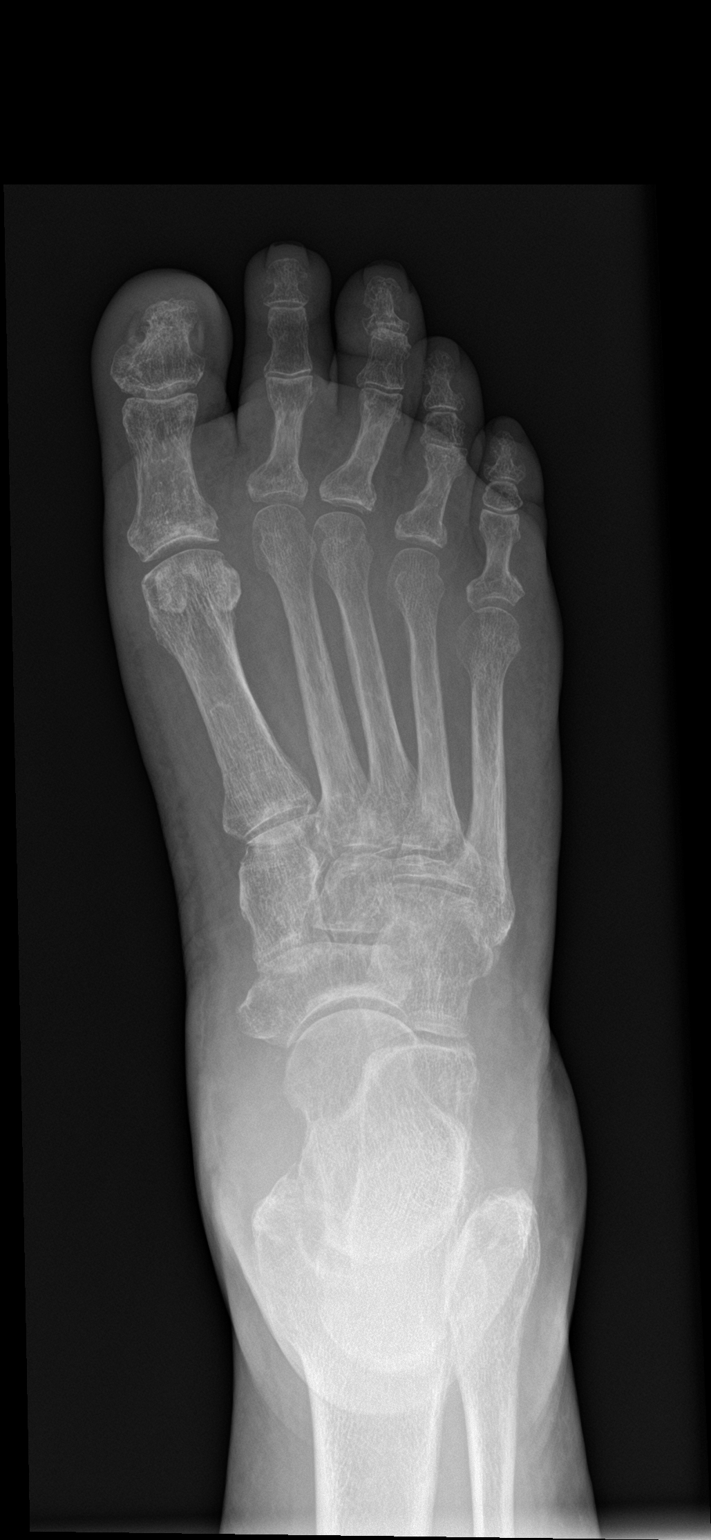

[foot obl]
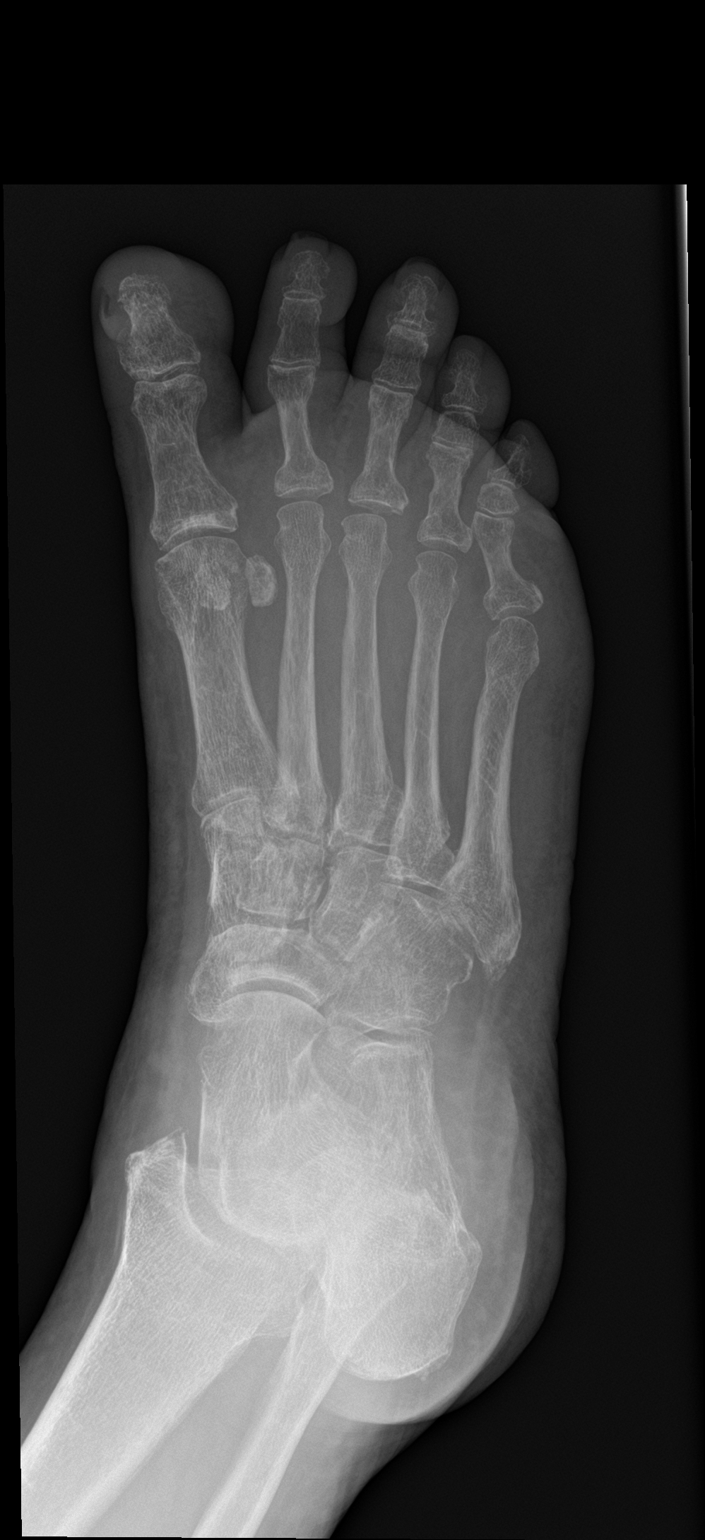

[foot lat]
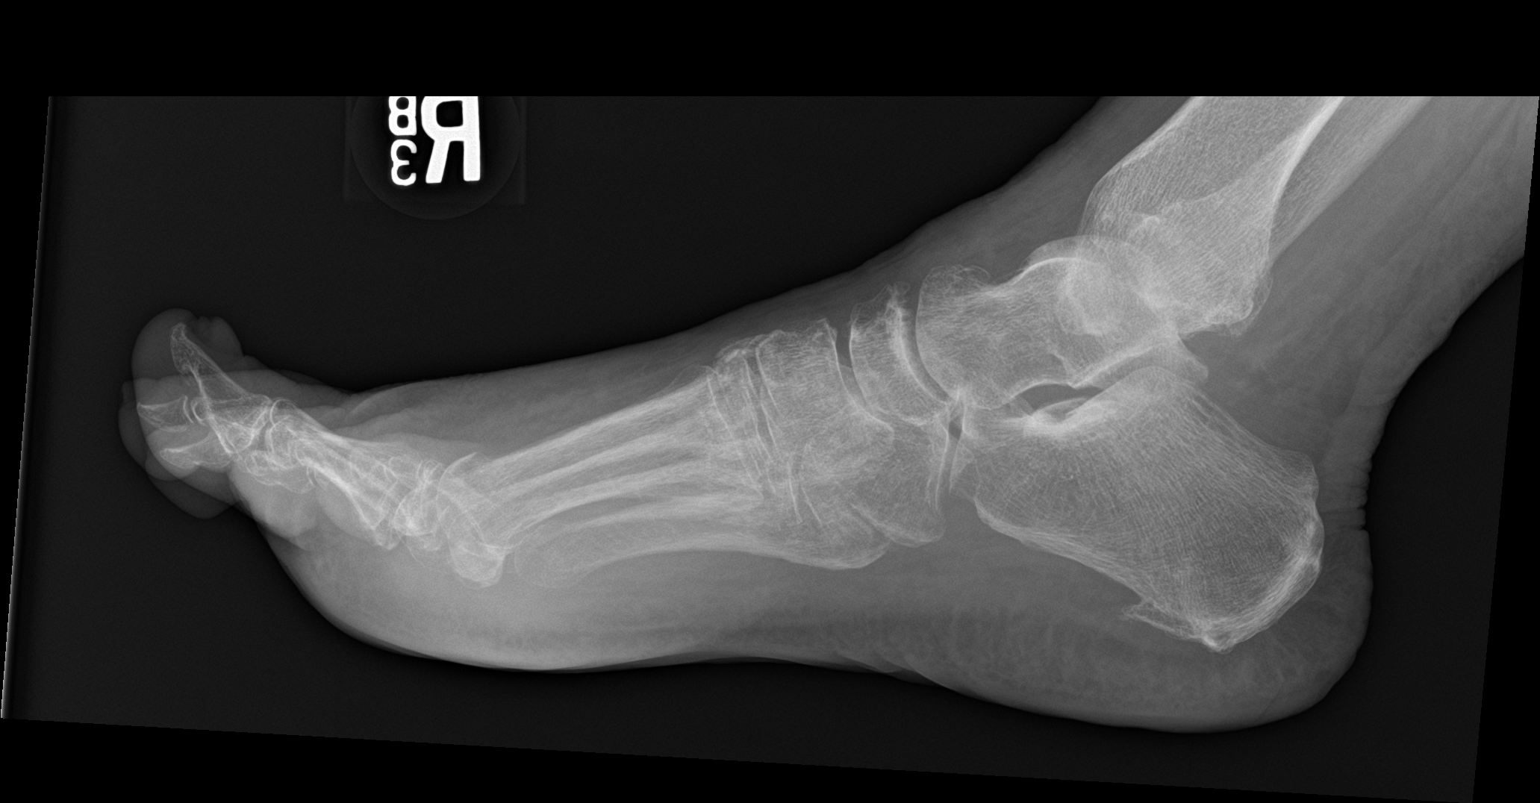

[3 of 3 positions shown; findings below may reference images not displayed]

FINDINGS: Calcaneal spurs are again seen as are tarsal degenerative changes.
No acute fracture or dislocation is noted. No gross soft tissue
abnormality is noted.
IMPRESSION: No acute abnormality noted.

## 2020-11-30 IMAGING — DX DG CHEST 2V
2 series · 2 of 2 positions shown · non-contrast
Comparison: 02/09/2018

CLINICAL DATA: Left lower chest wall pain with hard knot.

EXAM:
CHEST - 2 VIEW

[chest lat]
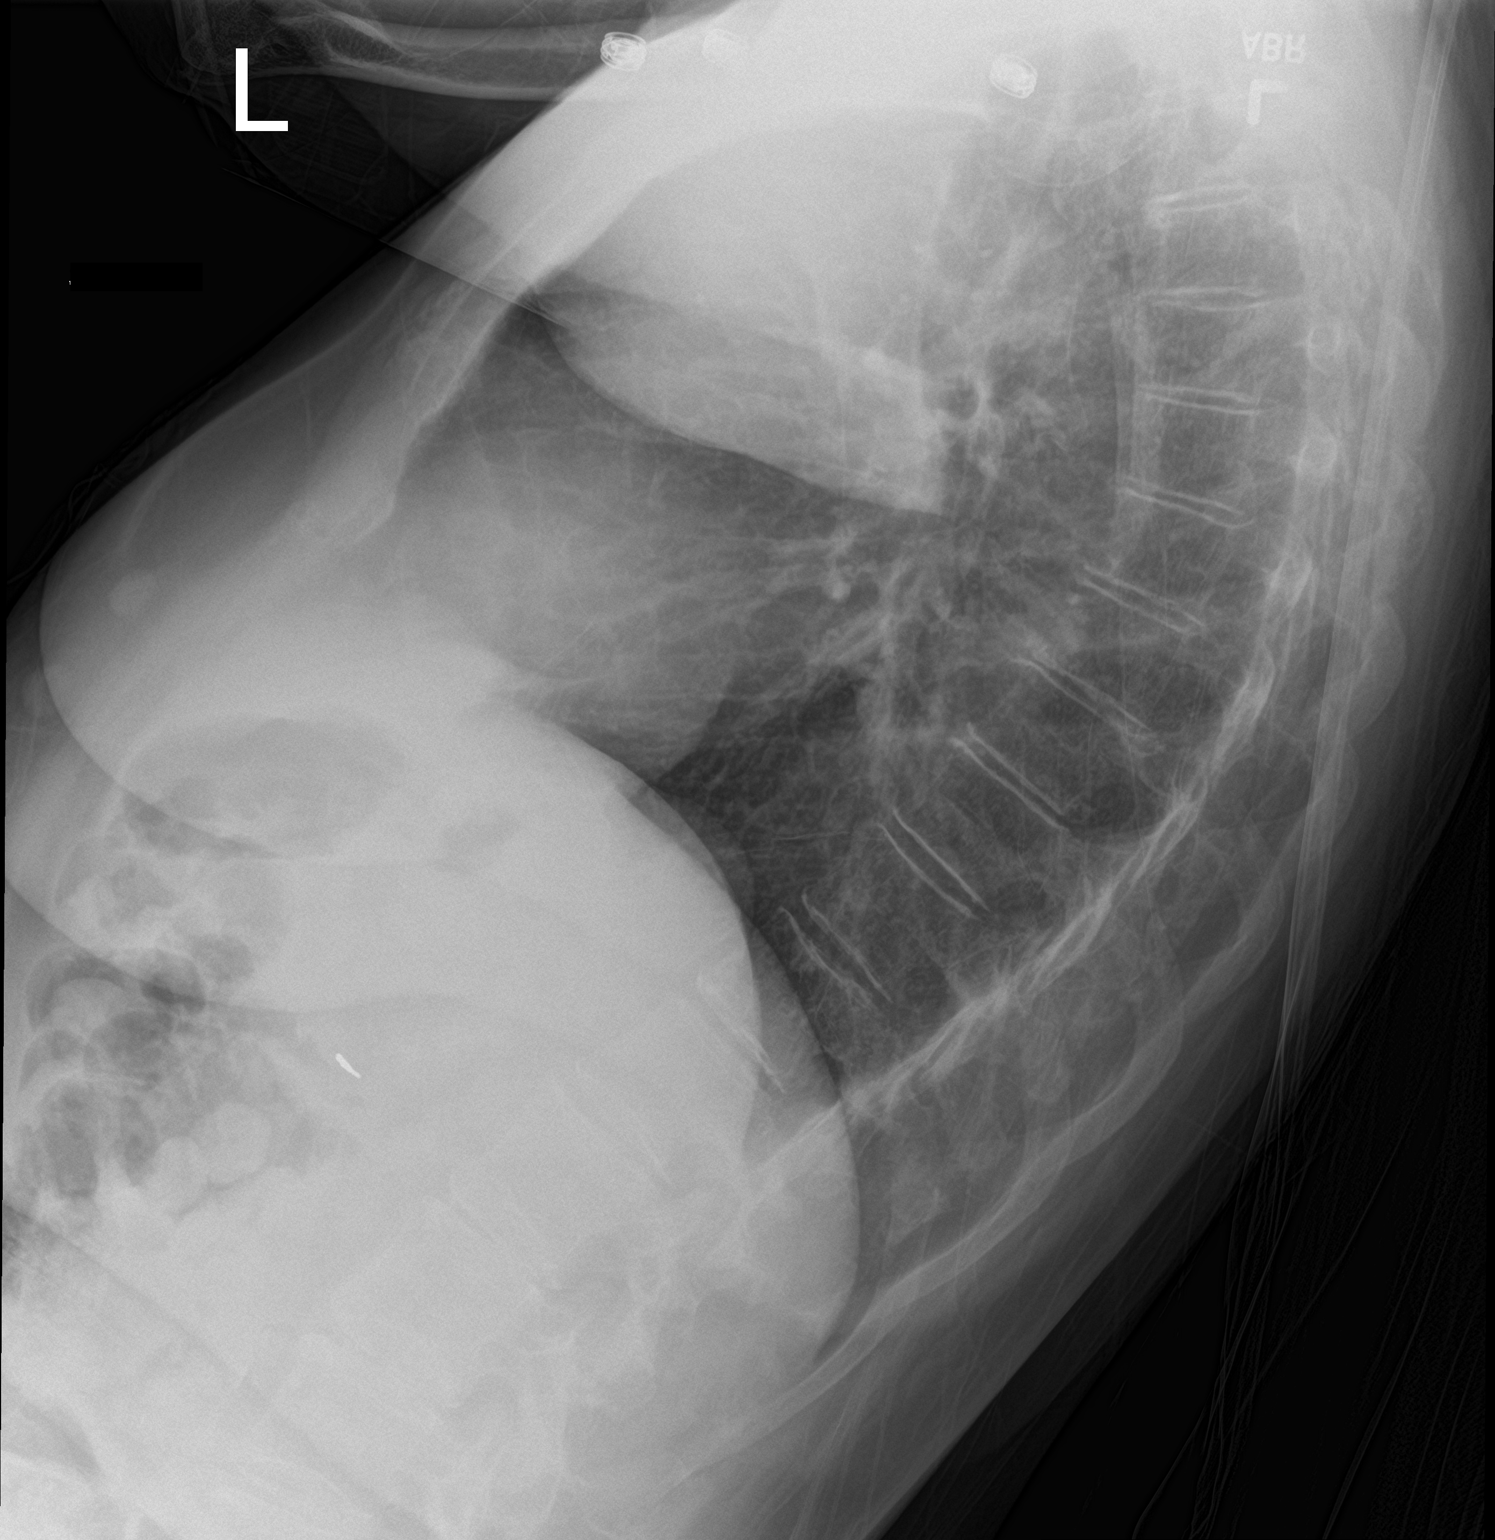

[chest ap]
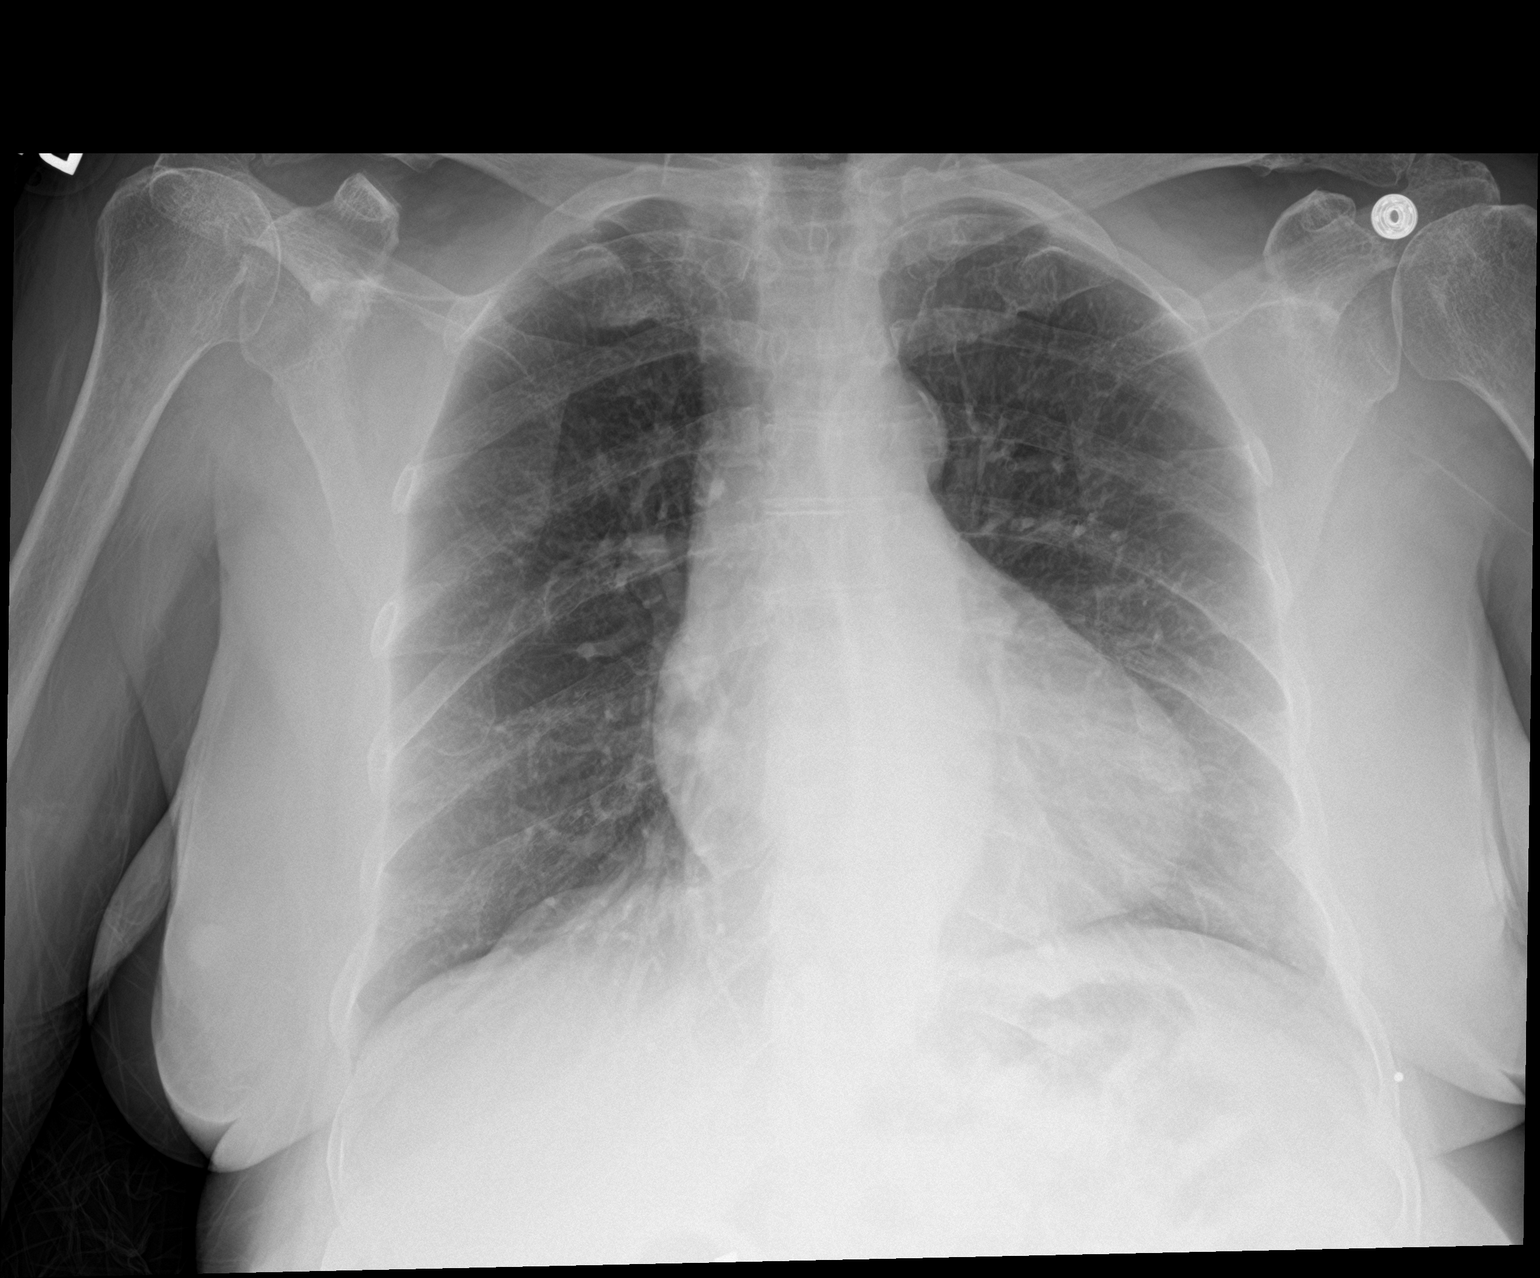

[2 of 2 positions shown; findings below may reference images not displayed]

FINDINGS: Normal heart size and pulmonary vascularity. No focal airspace
disease or consolidation in the lungs. No blunting of costophrenic
angles. No pneumothorax. Mediastinal contours appear intact.
Calcification of the aorta. No specific abnormality demonstrated in
the left lower chest to account for palpable changes.
IMPRESSION: No active cardiopulmonary disease.

## 2020-12-30 ENCOUNTER — Telehealth: Payer: Self-pay | Admitting: *Deleted

## 2020-12-30 NOTE — Telephone Encounter (Signed)
Received call from [facility]. Requested status on forms for back orthosis.   Advised that PCP does not order back orthosis. Advised if patient requires brace, then we will refer to ortho.   Requested for denial to be faxed back. Advised that fax will need to be sent to office again.

## 2021-01-28 ENCOUNTER — Encounter: Payer: Self-pay | Admitting: Internal Medicine

## 2021-02-23 ENCOUNTER — Other Ambulatory Visit: Payer: Medicare Other

## 2021-02-23 ENCOUNTER — Telehealth: Payer: Self-pay | Admitting: *Deleted

## 2021-02-23 ENCOUNTER — Other Ambulatory Visit: Payer: Self-pay

## 2021-02-23 ENCOUNTER — Other Ambulatory Visit: Payer: Self-pay | Admitting: Family Medicine

## 2021-02-23 DIAGNOSIS — R300 Dysuria: Secondary | ICD-10-CM | POA: Diagnosis not present

## 2021-02-23 LAB — URINALYSIS, ROUTINE W REFLEX MICROSCOPIC
Bilirubin Urine: NEGATIVE
Glucose, UA: NEGATIVE
Ketones, ur: NEGATIVE
Nitrite: NEGATIVE
Specific Gravity, Urine: 1.025 (ref 1.001–1.035)
pH: 5.5 (ref 5.0–8.0)

## 2021-02-23 LAB — MICROSCOPIC MESSAGE

## 2021-02-23 MED ORDER — CEPHALEXIN 500 MG PO CAPS: 500.0000 mg | ORAL_CAPSULE | Freq: Three times a day (TID) | ORAL | 0 refills | Status: DC

## 2021-02-23 NOTE — Telephone Encounter (Signed)
Patient walked into office with x3 days of urinary sx. States that she has burning and foul odor to urine.   Advised to leave urine sample for UA C&S. PCP to be made aware.

## 2021-02-24 LAB — URINE CULTURE
MICRO NUMBER:: 12397909
SPECIMEN QUALITY:: ADEQUATE

## 2021-02-24 NOTE — Telephone Encounter (Signed)
Call placed to patient and patient made aware.  

## 2021-03-04 NOTE — Telephone Encounter (Signed)
error 

## 2021-04-13 ENCOUNTER — Other Ambulatory Visit: Payer: Self-pay | Admitting: Gastroenterology

## 2021-04-15 ENCOUNTER — Other Ambulatory Visit: Payer: Self-pay

## 2021-04-15 ENCOUNTER — Telehealth: Payer: Self-pay

## 2021-04-15 ENCOUNTER — Ambulatory Visit (INDEPENDENT_AMBULATORY_CARE_PROVIDER_SITE_OTHER): Payer: Medicare Other

## 2021-04-15 VITALS — BP 120/70 | HR 86 | Ht 61.0 in | Wt 145.0 lb

## 2021-04-15 DIAGNOSIS — Z1231 Encounter for screening mammogram for malignant neoplasm of breast: Secondary | ICD-10-CM

## 2021-04-15 DIAGNOSIS — Z23 Encounter for immunization: Secondary | ICD-10-CM | POA: Diagnosis not present

## 2021-04-15 DIAGNOSIS — Z780 Asymptomatic menopausal state: Secondary | ICD-10-CM

## 2021-04-15 DIAGNOSIS — Z0000 Encounter for general adult medical examination without abnormal findings: Secondary | ICD-10-CM | POA: Diagnosis not present

## 2021-04-15 NOTE — Progress Notes (Signed)
Subjective:   Rebecca Osborne is a 70 y.o. female who presents for an Initial Medicare Annual Wellness Visit.  Review of Systems     Cardiac Risk Factors include: advanced age (>55mn, >>73women);dyslipidemia;sedentary lifestyle     Objective:    Today's Vitals   04/15/21 1445  BP: 120/70  Pulse: 86  Weight: 145 lb (65.8 kg)  Height: 5' 1"  (1.549 m)   Body mass index is 27.4 kg/m.  Advanced Directives 04/15/2021 07/15/2020 10/16/2019 08/23/2019 04/16/2019 04/15/2019 03/12/2019  Does Patient Have a Medical Advance Directive? No No No No No No -  Would patient like information on creating a medical advance directive? No - Patient declined No - Patient declined Yes (Inpatient - patient defers creating a medical advance directive at this time - Information given) No - Patient declined No - Patient declined No - Patient declined No - Patient declined    Current Medications (verified) Outpatient Encounter Medications as of 04/15/2021  Medication Sig   Calcium Carb-Cholecalciferol (CALCIUM 600+D3 PO) Take 1 tablet by mouth daily.    Cholecalciferol (VITAMIN D) 50 MCG (2000 UT) tablet Take 2,000 Units by mouth daily.   famotidine (PEPCID) 20 MG tablet TAKE 1 TABLET BY MOUTH EVERY DAY   ibuprofen (ADVIL) 200 MG tablet Take 200-400 mg by mouth every 6 (six) hours as needed (for pain.).   cephALEXin (KEFLEX) 500 MG capsule Take 1 capsule (500 mg total) by mouth 3 (three) times daily. (Patient not taking: Reported on 04/15/2021)   DULoxetine (CYMBALTA) 30 MG capsule TAKE 1 CAPSULE BY MOUTH EVERY DAY (Patient not taking: No sig reported)   HM SUPER VITAMIN B12 2500 MCG CHEW Chew 2,500 mcg by mouth daily. (Patient not taking: Reported on 04/15/2021)   RABEprazole (ACIPHEX) 20 MG tablet TAKE (1) TABLET BY MOUTH ONCE DAILY. (Patient not taking: Reported on 04/15/2021)   No facility-administered encounter medications on file as of 04/15/2021.    Allergies (verified) Codeine   History: Past  Medical History:  Diagnosis Date   Allergy    [place]cer [place]    cervical cancer cells   GERD (gastroesophageal reflux disease)    Osteopenia    Past Surgical History:  Procedure Laterality Date   ABDOMINAL HYSTERECTOMY     BIOPSY  10/16/2019   Procedure: BIOPSY;  Surgeon: RDaneil Dolin MD;  [facility];  Service: Endoscopy;;   BOWEL RESECTION N/A 06/22/2016   Procedure: SMALL BOWEL RESECTION;  Surgeon: JVickie Epley MD;  [facility];  Service: General;  Laterality: N/A;   CHOLECYSTECTOMY  2005   COLONOSCOPY  08/23/2019   Tortuous left colon, external and internal hemorrhoids   COLONOSCOPY N/A 08/23/2019   Procedure: COLONOSCOPY;  Surgeon: FDanie Binder MD;  [facility];  Service: Endoscopy;  Laterality: N/A;  8:30   COLONOSCOPY WITH ESOPHAGOGASTRODUODENOSCOPY (EGD)  09/2009   Fields: Normal colonoscopy.  H. pylori gastritis.  Schatzki ring status post esophageal dilation.  Next colonoscopy 10 years   ESOPHAGOGASTRODUODENOSCOPY N/A 10/16/2019   Rourk: Erosive reflux esophagitis, gastric erosions, duodenal diverticulum.  Biopsies from the stomach showed mild inflammation.  No H. pylori.   FRACTURE SURGERY     rt foot   LAPAROTOMY N/A 06/22/2016   Procedure: EXPLORATORY LAPAROTOMY;  Surgeon: JVickie Epley MD;  [facility];  Service: General;  Laterality: N/A;   LYSIS OF ADHESION N/A 06/22/2016   Procedure: LYSIS OF ADHESION;  Surgeon: JVickie Epley MD;  [facility];  Service: General;  Laterality: N/A;   MALONEY DILATION N/A 10/16/2019   Procedure: Venia Minks DILATION;  Surgeon: Daneil Dolin, MD;  [facility];  Service: Endoscopy;  Laterality: N/A;   Family History  Problem Relation Age of Onset   COPD Mother    Heart disease Mother    72 / [place] Mother    Vision loss Mother        from shingles   COPD Father    Cancer Brother        leukemia   Early death Brother    Colon cancer Neg Hx    Colon  polyps Neg Hx    Inflammatory bowel disease Neg Hx    Social History   Socioeconomic History   Marital status: Widowed    Spouse name: Not on file   Number of children: 3   Years of education: Not on file   Highest education level: Not on file  Occupational History   Not on file  Tobacco Use   Smoking status: Never   Smokeless tobacco: Never  Vaping Use   Vaping Use: Never used  Substance and Sexual Activity   Alcohol use: No   Drug use: No   Sexual activity: Not Currently  Other Topics Concern   Not on file  Social History Narrative   Husband died in 08/10/2010.   Social Determinants of Health   Financial Resource Strain: Low Risk    Difficulty of Paying Living Expenses: Not hard at all  Food Insecurity: No Food Insecurity   Worried About Charity fundraiser in the Last Year: Never true   [place] in the Last Year: Never true  Transportation Needs: No Transportation Needs   Lack of Transportation (Medical): No   Lack of Transportation (Non-Medical): No  Physical Activity: Sufficiently Active   Days of Exercise per Week: 5 days   Minutes of Exercise per Session: 30 min  Stress: No Stress Concern Present   Feeling of Stress : Not at all  Social Connections: Moderately Integrated   Frequency of Communication with Friends and Family: More than three times a week   Frequency of Social Gatherings with Friends and Family: More than three times a week   Attends Religious Services: More than 4 times per year   Active Member of [employer] or Organizations: Yes   Attends Archivist Meetings: More than 4 times per year   Marital Status: Widowed    Tobacco Counseling Counseling given: Not Answered   Clinical Intake:  Pre-visit preparation completed: Yes  Pain : No/denies pain     BMI - recorded: 27.4 Nutritional Status: BMI 25 -29 Overweight Nutritional Risks: None Diabetes: No  How often do you need to have someone help you when you read instructions,  pamphlets, or other written materials from your doctor or pharmacy?: 1 - Never  Diabetic?no  Interpreter Needed?: No  Information entered by :: MJ Mattye Verdone, LPN   Activities of Daily Living In your present state of health, do you have any difficulty performing the following activities: 04/15/2021  Hearing? N  Vision? N  Difficulty concentrating or making decisions? N  Walking or climbing stairs? N  Dressing or bathing? N  Doing errands, shopping? N  Preparing Food and eating ? N  Using the Toilet? N  In the past six months, have you accidently leaked urine? Y  Comment Urgency  Do you have problems with loss of bowel control? N  Managing your  Medications? N  Managing your Finances? N  Housekeeping or managing your Housekeeping? N  Some recent data might be hidden    Patient Care Team: Susy Frizzle, MD as PCP - General (Family Medicine)  Indicate any recent Medical Services you may have received from other than [facility] providers in the past year (date may be approximate).     Assessment:   This is a routine wellness examination for [place].  Hearing/Vision screen Hearing Screening - Comments:: Some hearing issues.C/O &quot;buzzing&quot; Vision Screening - Comments:: Readers. No eye exam.   Dietary issues and exercise activities discussed: Current Exercise Habits: Home exercise routine, Type of exercise: walking, Time (Minutes): 20, Frequency (Times/Week): 5, Weekly Exercise (Minutes/Week): 100, Intensity: Mild, Exercise limited by: cardiac condition(s)   Goals Addressed             This Visit's Progress    Have 3 meals a day       Would like to maintain good health, per pt.        Depression Screen PHQ 2/9 Scores 04/15/2021 10/22/2018 06/25/2018 07/14/2015  PHQ - 2 Score 0 1 0 0  PHQ- 9 Score - 2 - -    Fall Risk Fall Risk  04/15/2021 10/22/2018 06/25/2018  Falls in the past year? 0 0 0  Number falls in past yr: 0 - -  Injury with Fall? 0 - -  Risk for fall  due to : No Fall Risks - -  Follow up Falls prevention discussed - Falls evaluation completed    [place]:  Any stairs in or around the home? Yes  If so, are there any without handrails? No  Home free of loose throw rugs in walkways, pet beds, electrical cords, etc? Yes  Adequate lighting in your home to reduce risk of falls? No   ASSISTIVE DEVICES UTILIZED TO PREVENT FALLS:  Life alert? No  Use of a cane, walker or w/c? No  Grab bars in the bathroom? No  Shower chair or bench in shower? No  Elevated toilet seat or a handicapped toilet? No   TIMED UP AND GO:  Was the test performed? Yes .  Length of time to ambulate 10 feet: 8 sec.   Gait steady and fast without use of assistive device  Cognitive Function:     6CIT Screen 04/15/2021  What time? 0 points  Count back from 20 0 points  Months in reverse 2 points  Repeat phrase 0 points    Immunizations Immunization History  Administered Date(s) Administered   Fluad Quad(high Dose 65+) 03/16/2019   Influenza, High Dose Seasonal PF 03/10/2018   Pneumococcal Polysaccharide-23 09/08/2016    TDAP status: Due, Education has been provided regarding the importance of this vaccine. Advised may receive this vaccine at local pharmacy or Health Dept. Aware to provide a copy of the vaccination record if obtained from local pharmacy or Health Dept. Verbalized acceptance and understanding.  Flu Vaccine status: Completed at today's visit  Pneumococcal vaccine status: Due, Education has been provided regarding the importance of this vaccine. Advised may receive this vaccine at local pharmacy or Health Dept. Aware to provide a copy of the vaccination record if obtained from local pharmacy or Health Dept. Verbalized acceptance and understanding.  Covid-19 vaccine status: Declined, Education has been provided regarding the importance of this vaccine but patient still declined. Advised may receive this  vaccine at local pharmacy or Health Dept.or vaccine clinic. Aware to provide a copy  of the vaccination record if obtained from local pharmacy or Health Dept. Verbalized acceptance and understanding.  Qualifies for Shingles Vaccine? Yes   Zostavax completed No   Shingrix Completed?: No.    Education has been provided regarding the importance of this vaccine. Patient has been advised to call insurance company to determine out of pocket expense if they have not yet received this vaccine. Advised may also receive vaccine at local pharmacy or Health Dept. Verbalized acceptance and understanding.  Screening Tests Health Maintenance  Topic Date Due   COVID-19 Vaccine (1) Never done   Hepatitis C Screening  Never done   TETANUS/TDAP  Never done   Zoster Vaccines- Shingrix (1 of 2) Never done   Pneumonia Vaccine 6+ Years old (2 - PCV) 09/08/2017   INFLUENZA VACCINE  01/04/2021   MAMMOGRAM  05/28/2022   COLONOSCOPY (Pts 45-77yr Insurance coverage will need to be confirmed)  08/22/2029   DEXA SCAN  Completed   HPV VACCINES  Aged Out    Health Maintenance  Health Maintenance Due  Topic Date Due   COVID-19 Vaccine (1) Never done   Hepatitis C Screening  Never done   TETANUS/TDAP  Never done   Zoster Vaccines- Shingrix (1 of 2) Never done   Pneumonia Vaccine 70 Years old (2 - PCV) 09/08/2017   INFLUENZA VACCINE  01/04/2021    Colorectal cancer screening: Type of screening: Colonoscopy. Completed 08/23/2019. Repeat every 10 years  Mammogram status: Ordered 04/15/21. Pt provided with contact info and advised to call to schedule appt.   Bone Density status: Ordered 04/15/21. Pt provided with contact info and advised to call to schedule appt.  Lung Cancer Screening: (Low Dose CT Chest recommended if Age 70-80years, 30 Osborne-year currently smoking OR have quit w/in 15years.) does not qualify.  NON-SMOKER  Additional Screening:  Hepatitis C Screening: does qualify; Completed DUE  Vision  Screening: Recommended annual ophthalmology exams for early detection of glaucoma and other disorders of the eye. Is the patient up to date with their annual eye exam?  No  Who is the provider or what is the name of the office in which the patient attends annual eye exams? N/A If pt is not established with a provider, would they like to be referred to a provider to establish care? No .   Dental Screening: Recommended annual dental exams for proper oral hygiene  Community Resource Referral / Chronic Care Management: CRR required this visit?  No   CCM required this visit?  No      Plan:     I have personally reviewed and noted the following in the patient's chart:   Medical and social history Use of alcohol, tobacco or illicit drugs  Current medications and supplements including opioid prescriptions. Patient is not currently taking opioid prescriptions. Functional ability and status Nutritional status Physical activity Advanced directives List of other physicians Hospitalizations, surgeries, and ER visits in previous 12 months Vitals Screenings to include cognitive, depression, and falls Referrals and appointments  In addition, I have reviewed and discussed with patient certain preventive protocols, quality metrics, and best practice recommendations. A written personalized care plan for preventive services as well as general preventive health recommendations were provided to patient.     MChriss Driver LPN   [id number]  Nurse Notes: Up to date on colonoscopy. Mammogram and Dexa ordered today, pt agreeable. Flu vaccine given today. Discussed Prevnar and Shingles. Pt declined Covid vaccines.

## 2021-04-15 NOTE — Patient Instructions (Signed)
[name] , Thank you for taking time to come for your Medicare Wellness Visit. I appreciate your ongoing commitment to your health goals. Please review the following plan we discussed and let me know if I can assist you in the future.   Screening recommendations/referrals: Colonoscopy: Done 08/23/2019 Repeat in 10 years  Mammogram: Ordered today.  Bone Density: Ordered today.   Recommended yearly ophthalmology/optometry visit for glaucoma screening and checkup Recommended yearly dental visit for hygiene and checkup  Vaccinations: Influenza vaccine: Done 04/15/2021 Pneumococcal vaccine: Done 09/08/2016 Tdap vaccine: Due Repeat in 10 years  Shingles vaccine: Shingrix discussed. Please contact your pharmacy for coverage information.     Covid-19:Declined.  Advanced directives: Advance directive discussed with you today. Even though you declined this today, please call our office should you change your mind, and we can give you the proper paperwork for you to fill out.   Conditions/risks identified: Aim for 30 minutes of exercise or brisk walking each day, drink 6-8 glasses of water and eat lots of fruits and vegetables.   Next appointment: Follow up in one year for your annual wellness visit 2023.   Preventive Care 33 Years and Older, Female Preventive care refers to lifestyle choices and visits with your health care provider that can promote health and wellness. What does preventive care include? A yearly physical exam. This is also called an annual well check. Dental exams once or twice a year. Routine eye exams. Ask your health care provider how often you should have your eyes checked. Personal lifestyle choices, including: Daily care of your teeth and gums. Regular physical activity. Eating a healthy diet. Avoiding tobacco and drug use. Limiting alcohol use. Practicing safe sex. Taking low-dose aspirin every day. Taking vitamin and mineral supplements as recommended by your  health care provider. What happens during an annual well check? The services and screenings done by your health care provider during your annual well check will depend on your age, overall health, lifestyle risk factors, and family history of disease. Counseling  Your health care provider may ask you questions about your: Alcohol use. Tobacco use. Drug use. Emotional well-being. Home and relationship well-being. Sexual activity. Eating habits. History of falls. Memory and ability to understand (cognition). Work and work Statistician. Reproductive health. Screening  You may have the following tests or measurements: Height, weight, and BMI. Blood pressure. Lipid and cholesterol levels. These may be checked every 5 years, or more frequently if you are over 69 years old. Skin check. Lung cancer screening. You may have this screening every year starting at age 37 if you have a 30-pack-year history of smoking and currently smoke or have quit within the past 15 years. Fecal occult blood test (FOBT) of the stool. You may have this test every year starting at age 36. Flexible sigmoidoscopy or colonoscopy. You may have a sigmoidoscopy every 5 years or a colonoscopy every 10 years starting at age 15. Hepatitis C blood test. Hepatitis B blood test. Sexually transmitted disease (STD) testing. Diabetes screening. This is done by checking your blood sugar (glucose) after you have not eaten for a while (fasting). You may have this done every 1-3 years. Bone density scan. This is done to screen for osteoporosis. You may have this done starting at age 25. Mammogram. This may be done every 1-2 years. Talk to your health care provider about how often you should have regular mammograms. Talk with your health care provider about your test results, treatment options, and if necessary,  the need for more tests. Vaccines  Your health care provider may recommend certain vaccines, such as: Influenza vaccine.  This is recommended every year. Tetanus, diphtheria, and acellular pertussis (Tdap, Td) vaccine. You may need a Td booster every 10 years. Zoster vaccine. You may need this after age 78. Pneumococcal 13-valent conjugate (PCV13) vaccine. One dose is recommended after age 68. Pneumococcal polysaccharide (PPSV23) vaccine. One dose is recommended after age 58. Talk to your health care provider about which screenings and vaccines you need and how often you need them. This information is not intended to replace advice given to you by your health care provider. Make sure you discuss any questions you have with your health care provider. Document Released: 06/19/2015 Document Revised: 02/10/2016 Document Reviewed: 03/24/2015 Elsevier Interactive Patient Education  2017 [place] Prevention in the Home Falls can cause injuries. They can happen to people of all ages. There are many things you can do to make your home safe and to help prevent falls. What can I do on the outside of my home? Regularly fix the edges of walkways and driveways and fix any cracks. Remove anything that might make you trip as you walk through a door, such as a raised step or threshold. Trim any bushes or trees on the path to your home. Use bright outdoor lighting. Clear any walking paths of anything that might make someone trip, such as rocks or tools. Regularly check to see if handrails are loose or broken. Make sure that both sides of any steps have handrails. Any raised decks and porches should have guardrails on the edges. Have any leaves, snow, or ice cleared regularly. Use sand or salt on walking paths during winter. Clean up any spills in your garage right away. This includes oil or grease spills. What can I do in the bathroom? Use night lights. Install grab bars by the toilet and in the tub and shower. Do not use towel bars as grab bars. Use non-skid mats or decals in the tub or shower. If you need to sit  down in the shower, use a plastic, non-slip stool. Keep the floor dry. Clean up any water that spills on the floor as soon as it happens. Remove soap buildup in the tub or shower regularly. Attach bath mats securely with double-sided non-slip rug tape. Do not have throw rugs and other things on the floor that can make you trip. What can I do in the bedroom? Use night lights. Make sure that you have a light by your bed that is easy to reach. Do not use any sheets or blankets that are too big for your bed. They should not hang down onto the floor. Have a firm chair that has side arms. You can use this for support while you get dressed. Do not have throw rugs and other things on the floor that can make you trip. What can I do in the kitchen? Clean up any spills right away. Avoid walking on wet floors. Keep items that you use a lot in easy-to-reach places. If you need to reach something above you, use a strong step stool that has a grab bar. Keep electrical cords out of the way. Do not use floor polish or wax that makes floors slippery. If you must use wax, use non-skid floor wax. Do not have throw rugs and other things on the floor that can make you trip. What can I do with my stairs? Do not leave any items on  the stairs. Make sure that there are handrails on both sides of the stairs and use them. Fix handrails that are broken or loose. Make sure that handrails are as long as the stairways. Check any carpeting to make sure that it is firmly attached to the stairs. Fix any carpet that is loose or worn. Avoid having throw rugs at the top or bottom of the stairs. If you do have throw rugs, attach them to the floor with carpet tape. Make sure that you have a light switch at the top of the stairs and the bottom of the stairs. If you do not have them, ask someone to add them for you. What else can I do to help prevent falls? Wear shoes that: Do not have high heels. Have rubber bottoms. Are  comfortable and fit you well. Are closed at the toe. Do not wear sandals. If you use a stepladder: Make sure that it is fully opened. Do not climb a closed stepladder. Make sure that both sides of the stepladder are locked into place. Ask someone to hold it for you, if possible. Clearly mark and make sure that you can see: Any grab bars or handrails. First and last steps. Where the edge of each step is. Use tools that help you move around (mobility aids) if they are needed. These include: Canes. Walkers. Scooters. Crutches. Turn on the lights when you go into a dark area. Replace any light bulbs as soon as they burn out. Set up your furniture so you have a clear path. Avoid moving your furniture around. If any of your floors are uneven, fix them. If there are any pets around you, be aware of where they are. Review your medicines with your doctor. Some medicines can make you feel dizzy. This can increase your chance of falling. Ask your doctor what other things that you can do to help prevent falls. This information is not intended to replace advice given to you by your health care provider. Make sure you discuss any questions you have with your health care provider. Document Released: 03/19/2009 Document Revised: 10/29/2015 Document Reviewed: 06/27/2014 Elsevier Interactive Patient Education  2017 [employer].

## 2021-04-15 NOTE — Telephone Encounter (Signed)
Pt here for AWV. Pt states she had an episode 3 weeks ago of a sharp pain under left arm that radiated around to chest. Pt states it happened 3 times in the same day. Pt denies SOB, N/V, chest pressure, sweating. Pt asks if there is a test she can have done to check her arteries to make sure she does not have a blockage?

## 2021-04-19 ENCOUNTER — Other Ambulatory Visit: Payer: Self-pay

## 2021-04-19 ENCOUNTER — Ambulatory Visit (INDEPENDENT_AMBULATORY_CARE_PROVIDER_SITE_OTHER): Payer: Medicare Other | Admitting: Family Medicine

## 2021-04-19 ENCOUNTER — Encounter: Payer: Self-pay | Admitting: Family Medicine

## 2021-04-19 VITALS — BP 118/62 | HR 94 | Temp 98.3°F | Resp 17 | Ht 61.0 in | Wt 145.0 lb

## 2021-04-19 DIAGNOSIS — R079 Chest pain, unspecified: Secondary | ICD-10-CM | POA: Diagnosis not present

## 2021-04-20 ENCOUNTER — Other Ambulatory Visit: Payer: Self-pay | Admitting: Family Medicine

## 2021-04-20 NOTE — Addendum Note (Signed)
Addended by: Jenna Luo T on: 04/20/2021 06:25 AM   Modules accepted: Orders

## 2021-04-20 NOTE — Progress Notes (Signed)
Subjective:    Patient ID: Rebecca Osborne, female    DOB: 10/25/1950, 70 y.o.   MRN: [id number] Patient presents today with chest pain.  She states that 2 weeks ago, she had an episode of chest pain.  The pain began in her left axilla and radiated into her left sternum.  It was intense pain sharp and pressure-like.  It lasted 15 minutes.  She was watching TV while it occurred.  She had not just eaten.  She denies any acid reflux.  She denied any cough or hemoptysis or pleurisy.  The pain subsided and then reoccurred again lasting approximately 10 minutes.  There was no nausea or diaphoresis.  She has not had the pain again.  Today on exam the pain is not reproducible with palpation of the chest wall, the ribs, or in the axilla.  I do not appreciate any masses in the left axilla or in the left breast.  There is no visible rash.  EKG shows normal sinus rhythm with normal intervals and a normal axis with no evidence of ischemia or infarction Past Medical History:  Diagnosis Date   Allergy    Cancer ([place])    cervical cancer cells   GERD (gastroesophageal reflux disease)    Osteopenia    Past Surgical History:  Procedure Laterality Date   ABDOMINAL HYSTERECTOMY     BIOPSY  10/16/2019   Procedure: BIOPSY;  Surgeon: Daneil Dolin, MD;  [facility];  Service: Endoscopy;;   BOWEL RESECTION N/A 06/22/2016   Procedure: SMALL BOWEL RESECTION;  Surgeon: Vickie Epley, MD;  Location: [facility];  Service: General;  Laterality: N/A;   CHOLECYSTECTOMY  2005   COLONOSCOPY  08/23/2019   Tortuous left colon, external and internal hemorrhoids   COLONOSCOPY N/A 08/23/2019   Procedure: COLONOSCOPY;  Surgeon: Danie Binder, MD;  Location: [facility];  Service: Endoscopy;  Laterality: N/A;  8:30   COLONOSCOPY WITH ESOPHAGOGASTRODUODENOSCOPY (EGD)  09/2009   Fields: Normal colonoscopy.  H. pylori gastritis.  Schatzki ring status post esophageal dilation.  Next colonoscopy 10 years    ESOPHAGOGASTRODUODENOSCOPY N/A 10/16/2019   [place]: Erosive reflux esophagitis, gastric erosions, duodenal diverticulum.  Biopsies from the stomach showed mild inflammation.  No H. pylori.   FRACTURE SURGERY     rt foot   LAPAROTOMY N/A 06/22/2016   Procedure: EXPLORATORY LAPAROTOMY;  Surgeon: Vickie Epley, MD;  Location: [facility];  Service: General;  Laterality: N/A;   LYSIS OF ADHESION N/A 06/22/2016   Procedure: LYSIS OF ADHESION;  Surgeon: Vickie Epley, MD;  Location: [facility];  Service: General;  Laterality: N/A;   MALONEY DILATION N/A 10/16/2019   Procedure: Venia Minks DILATION;  Surgeon: Daneil Dolin, MD;  [facility];  Service: Endoscopy;  Laterality: N/A;   Current Outpatient Medications on File Prior to Visit  Medication Sig Dispense Refill   Calcium Carb-Cholecalciferol (CALCIUM 600+D3 PO) Take 1 tablet by mouth daily.      Cholecalciferol (VITAMIN D) 50 MCG (2000 UT) tablet Take 2,000 Units by mouth daily.     famotidine (PEPCID) 20 MG tablet TAKE 1 TABLET BY MOUTH EVERY DAY 90 tablet 3   HM SUPER VITAMIN B12 2500 MCG CHEW Chew 2,500 mcg by mouth daily.     ibuprofen (ADVIL) 200 MG tablet Take 200-400 mg by mouth every 6 (six) hours as needed (for pain.).     No current facility-administered medications on file prior to visit.  Allergies  Allergen Reactions   Codeine Itching   Social History   Socioeconomic History   Marital status: Widowed    Spouse name: Not on file   Number of children: 3   Years of education: Not on file   Highest education level: Not on file  Occupational History   Not on file  Tobacco Use   Smoking status: Never   Smokeless tobacco: Never  Vaping Use   Vaping Use: Never used  Substance and Sexual Activity   Alcohol use: No   Drug use: No   Sexual activity: Not Currently  Other Topics Concern   Not on file  Social History Narrative   Husband died in 08-16-10.   Social Determinants of Health   Financial Resource Strain: Low  Risk    Difficulty of Paying Living Expenses: Not hard at all  Food Insecurity: No Food Insecurity   Worried About Charity fundraiser in the Last Year: Never true   [place] in the Last Year: Never true  Transportation Needs: No Transportation Needs   Lack of Transportation (Medical): No   Lack of Transportation (Non-Medical): No  Physical Activity: Sufficiently Active   Days of Exercise per Week: 5 days   Minutes of Exercise per Session: 30 min  Stress: No Stress Concern Present   Feeling of Stress : Not at all  Social Connections: Moderately Integrated   Frequency of Communication with Friends and Family: More than three times a week   Frequency of Social Gatherings with Friends and Family: More than three times a week   Attends Religious Services: More than 4 times per year   Active Member of [employer] or Organizations: Yes   Attends Archivist Meetings: More than 4 times per year   Marital Status: Widowed  Human resources officer Violence: Not At Risk   Fear of Current or Ex-Partner: No   Emotionally Abused: No   Physically Abused: No   Sexually Abused: No      Review of Systems  Respiratory:  Positive for cough.   All other systems reviewed and are negative.     Objective:   Physical Exam Vitals reviewed.  Constitutional:      General: She is not in acute distress.    Appearance: Normal appearance. She is not ill-appearing or toxic-appearing.  Cardiovascular:     Rate and Rhythm: Normal rate and regular rhythm.     Pulses: Normal pulses.     Heart sounds: Normal heart sounds. No murmur heard.   No friction rub.  Pulmonary:     Effort: Pulmonary effort is normal. No respiratory distress.     Breath sounds: No stridor. No wheezing, rhonchi or rales.  Abdominal:     General: Abdomen is flat. Bowel sounds are normal.     Palpations: Abdomen is soft.     Tenderness: There is no abdominal tenderness. There is no rebound.  Musculoskeletal:     Right lower  leg: No edema.     Left lower leg: No edema.  Lymphadenopathy:     Cervical: No cervical adenopathy.  Skin:    Findings: No bruising, erythema, lesion or rash.  Neurological:     Mental Status: She is alert.          Assessment & Plan:  Chest pain, unspecified type - Plan: EKG 12-Lead The patient's chest pain is very atypical.  It was unrelated to exertion.  However it was intense, it was pressure-like, it lasted  about 15 minutes, and it radiated all throughout her left chest.  She has not had any exertional chest pain or shortness of breath.  Therefore my pretest probability of cardiovascular disease is low.  However I would like to consult cardiology for a stress test.  I feel more likely that this was musculoskeletal chest wall pain.  It does not sound gastrointestinal in origin.  We will consult cardiology.

## 2021-06-02 ENCOUNTER — Ambulatory Visit (HOSPITAL_COMMUNITY)
Admission: RE | Admit: 2021-06-02 | Discharge: 2021-06-02 | Disposition: A | Discharge status: Home / Self Care | Payer: Medicare Other | Source: Ambulatory Visit | Attending: Family Medicine | Admitting: Family Medicine

## 2021-06-02 ENCOUNTER — Other Ambulatory Visit: Payer: Self-pay

## 2021-06-02 DIAGNOSIS — M8589 Other specified disorders of bone density and structure, multiple sites: Secondary | ICD-10-CM | POA: Diagnosis not present

## 2021-06-02 DIAGNOSIS — Z13820 Encounter for screening for osteoporosis: Secondary | ICD-10-CM | POA: Diagnosis not present

## 2021-06-02 DIAGNOSIS — Z1231 Encounter for screening mammogram for malignant neoplasm of breast: Secondary | ICD-10-CM | POA: Insufficient documentation

## 2021-06-02 DIAGNOSIS — Z780 Asymptomatic menopausal state: Secondary | ICD-10-CM | POA: Insufficient documentation

## 2021-06-09 ENCOUNTER — Telehealth: Payer: Self-pay

## 2021-06-09 NOTE — Telephone Encounter (Signed)
Patient called complaining of testing positive for covid.  She is complaining of cough.  Left message for patient to call back and virtual schedule appointment.

## 2021-07-05 ENCOUNTER — Other Ambulatory Visit: Payer: Self-pay

## 2021-07-05 ENCOUNTER — Encounter: Payer: Self-pay | Admitting: Orthopedic Surgery

## 2021-07-05 ENCOUNTER — Telehealth: Payer: Self-pay

## 2021-07-05 ENCOUNTER — Ambulatory Visit (INDEPENDENT_AMBULATORY_CARE_PROVIDER_SITE_OTHER): Payer: Medicare Other | Admitting: Nurse Practitioner

## 2021-07-05 ENCOUNTER — Encounter: Payer: Self-pay | Admitting: Nurse Practitioner

## 2021-07-05 ENCOUNTER — Ambulatory Visit (HOSPITAL_COMMUNITY)
Admission: RE | Admit: 2021-07-05 | Discharge: 2021-07-05 | Disposition: A | Discharge status: Home / Self Care | Payer: Medicare Other | Source: Ambulatory Visit | Attending: Nurse Practitioner | Admitting: Nurse Practitioner

## 2021-07-05 VITALS — BP 122/82 | HR 75 | Ht 61.0 in | Wt 141.0 lb

## 2021-07-05 DIAGNOSIS — G8929 Other chronic pain: Secondary | ICD-10-CM | POA: Diagnosis not present

## 2021-07-05 DIAGNOSIS — M25511 Pain in right shoulder: Secondary | ICD-10-CM

## 2021-07-05 DIAGNOSIS — H6122 Impacted cerumen, left ear: Secondary | ICD-10-CM

## 2021-07-05 NOTE — Telephone Encounter (Signed)
[facility] called in stating that the order that was just placed for this pt needs to be changed to an order for OT for right shoulder pain. Please advise.

## 2021-07-05 NOTE — Telephone Encounter (Signed)
Order placed for OT

## 2021-07-05 NOTE — Telephone Encounter (Signed)
That is fine 

## 2021-07-05 NOTE — Progress Notes (Signed)
Subjective:    Patient ID: Rebecca Osborne, female    DOB: 1951-03-20, 71 y.o.   MRN: [id number]  HPI: Rebecca Osborne is a 71 y.o. female presenting for  Chief Complaint  Patient presents with   Ear Problem   Shoulder Pain    Right side   EAR PAIN Duration: days Involved ear(s): left ear cannot hear out of; right ear is popping Severity:  5/10  Quality: feels like she is in a tunnel Fever: no Otorrhea: no Upper respiratory infection symptoms: no Pruritus: no Hearing loss: yes Water immersion no Using Q-tips: yes Recurrent otitis media: no Status: worse Treatments attempted: Q-tip  SHOULDER PAIN Reports shoulder pain has been going on for 2 months and is worsening.  She cannot use her hair spray unless she uses her other arm for support.  She denies any fall or recent injury.  She describes it as "locked up."  She is taking Tylenol with some relief.    Allergies  Allergen Reactions   Codeine Itching    Outpatient Encounter Medications as of 07/05/2021  Medication Sig   Calcium Carb-Cholecalciferol (CALCIUM 600+D3 PO) Take 1 tablet by mouth daily.    Cholecalciferol (VITAMIN D) 50 MCG (2000 UT) tablet Take 2,000 Units by mouth daily.   famotidine (PEPCID) 20 MG tablet TAKE 1 TABLET BY MOUTH EVERY DAY   HM SUPER VITAMIN B12 2500 MCG CHEW Chew 2,500 mcg by mouth daily.   ibuprofen (ADVIL) 200 MG tablet Take 200-400 mg by mouth every 6 (six) hours as needed (for pain.).   No facility-administered encounter medications on file as of 07/05/2021.    Patient Active Problem List   Diagnosis Date Noted   Esophageal dysphagia 08/28/2019   Colon cancer screening    Hypomagnesemia 04/29/2019   C. difficile colitis 04/17/2019   Hypokalemia    Colitis    Diarrhea of presumed infectious origin    Pancolitis ([place]) 03/11/2019   Normocytic anemia 03/11/2019   Complex regional pain syndrome type 1 of right lower extremity 10/22/2018   Small bowel obstruction due to adhesions  ([place]) 06/22/2016   GERD 08/31/2009   DYSPHAGIA UNSPECIFIED 08/31/2009    Past Medical History:  Diagnosis Date   Allergy    Cancer ([place])    cervical cancer cells   GERD (gastroesophageal reflux disease)    Osteopenia     Relevant past medical, surgical, family and social history reviewed and updated as indicated. Interim medical history since our last visit reviewed.  Review of Systems Per HPI unless specifically indicated above     Objective:    BP 122/82    Pulse 75    Ht 5' 1"  (1.549 m)    Wt 141 lb (64 kg)    SpO2 95%    BMI 26.64 kg/m   Wt Readings from Last 3 Encounters:  07/05/21 141 lb (64 kg)  04/19/21 145 lb (65.8 kg)  04/15/21 145 lb (65.8 kg)    Physical Exam Vitals and nursing note reviewed.  Constitutional:      General: She is not in acute distress.    Appearance: Normal appearance. She is normal weight.  HENT:     Head: Normocephalic and atraumatic.     Right Ear: Tympanic membrane, ear canal and external ear normal.     Left Ear: There is impacted cerumen.  Musculoskeletal:     Right shoulder: Tenderness and bony tenderness present. No swelling or effusion. Normal range of motion. Normal strength.  Normal pulse.     Left shoulder: Normal.  Skin:    General: Skin is warm and dry.     Capillary Refill: Capillary refill takes less than 2 seconds.     Coloration: Skin is not jaundiced or pale.     Findings: No erythema.  Neurological:     Mental Status: She is alert and oriented to person, place, and time.     Motor: No weakness.     Gait: Gait normal.  Psychiatric:        Mood and Affect: Mood normal.        Behavior: Behavior normal.        Thought Content: Thought content normal.        Judgment: Judgment normal.      Assessment & Plan:  1. Chronic right shoulder pain Acute on chronic x 2 months.  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  Check shoulder x-ray.  Refer to orthopedics and start PT in the meantime.  Encouraged Tylenol, ice, topical  antiinflammatory rub.   - Ambulatory referral to Orthopedics - Ambulatory referral to Physical Therapy - DG Shoulder Right; Future  2. Left ear impacted cerumen Left ear irrigated with solution of warm water and hydrogen peroxide.  Cerumen removed from external auditory canal.  TM intact and slightly erythematous, however not bulging and no sign of infection.  Discouraged use of Q-tips in the future.   - Ear Lavage   Follow up plan: Return for with PCP.

## 2021-07-08 ENCOUNTER — Telehealth: Payer: Self-pay

## 2021-07-08 NOTE — Telephone Encounter (Signed)
Patient aware of results and recommendations.

## 2021-07-08 NOTE — Telephone Encounter (Signed)
-----   Message from Eulogio Bear, NP sent at 07/07/2021  9:02 AM EST ----- Please notify with results.  She does not check Mychart.  Right shoulder x-ray does not show any acute bone abnormality.  Continue with plan of care as we discussed earlier this week.

## 2021-07-26 ENCOUNTER — Encounter (HOSPITAL_COMMUNITY): Payer: Self-pay

## 2021-07-26 ENCOUNTER — Ambulatory Visit (HOSPITAL_COMMUNITY): Payer: Medicare Other | Attending: Nurse Practitioner

## 2021-07-26 ENCOUNTER — Other Ambulatory Visit: Payer: Self-pay

## 2021-07-26 DIAGNOSIS — G8929 Other chronic pain: Secondary | ICD-10-CM | POA: Insufficient documentation

## 2021-07-26 DIAGNOSIS — R29898 Other symptoms and signs involving the musculoskeletal system: Secondary | ICD-10-CM | POA: Diagnosis not present

## 2021-07-26 DIAGNOSIS — M25511 Pain in right shoulder: Secondary | ICD-10-CM | POA: Diagnosis not present

## 2021-07-26 DIAGNOSIS — M25611 Stiffness of right shoulder, not elsewhere classified: Secondary | ICD-10-CM | POA: Insufficient documentation

## 2021-07-26 NOTE — Therapy (Signed)
[facility] [address] [place], [place], [address] Phone: [phone]   Fax:  [phone]  Occupational Therapy Evaluation  Patient Details  Name: Rebecca Osborne MRN: [id number] Date of Birth: [birth year] Referring Provider (OT): Noemi Chapel, NP   Encounter Date: 07/26/2021   OT End of Session - 07/26/21 1621     Visit Number 1    Number of Visits 12    Date for OT Re-Evaluation 09/06/21    Authorization Type [employer] medicare    Progress Note Due on Visit 10    OT Start Time 1515    OT Stop Time 1600    OT Time Calculation (min) 45 min    Activity Tolerance Patient limited by pain    Behavior During Therapy [facility] for tasks assessed/performed             Past Medical History:  Diagnosis Date   Allergy    Cancer ([place])    cervical cancer cells   GERD (gastroesophageal reflux disease)    Osteopenia     Past Surgical History:  Procedure Laterality Date   ABDOMINAL HYSTERECTOMY     BIOPSY  10/16/2019   Procedure: BIOPSY;  Surgeon: Daneil Dolin, MD;  [facility];  Service: Endoscopy;;   BOWEL RESECTION N/A 06/22/2016   Procedure: SMALL BOWEL RESECTION;  Surgeon: Vickie Epley, MD;  Location: [facility];  Service: General;  Laterality: N/A;   CHOLECYSTECTOMY  2005   COLONOSCOPY  08/23/2019   Tortuous left colon, external and internal hemorrhoids   COLONOSCOPY N/A 08/23/2019   Procedure: COLONOSCOPY;  Surgeon: Danie Binder, MD;  [facility];  Service: Endoscopy;  Laterality: N/A;  8:30   COLONOSCOPY WITH ESOPHAGOGASTRODUODENOSCOPY (EGD)  09/2009   Fields: Normal colonoscopy.  H. pylori gastritis.  Schatzki ring status post esophageal dilation.  Next colonoscopy 10 years   ESOPHAGOGASTRODUODENOSCOPY N/A 10/16/2019   Rourk: Erosive reflux esophagitis, gastric erosions, duodenal diverticulum.  Biopsies from the stomach showed mild inflammation.  No H. pylori.   FRACTURE SURGERY     rt foot   LAPAROTOMY  N/A 06/22/2016   Procedure: EXPLORATORY LAPAROTOMY;  Surgeon: Vickie Epley, MD;  Location: [facility];  Service: General;  Laterality: N/A;   LYSIS OF ADHESION N/A 06/22/2016   Procedure: LYSIS OF ADHESION;  Surgeon: Vickie Epley, MD;  Location: [facility];  Service: General;  Laterality: N/A;   MALONEY DILATION N/A 10/16/2019   Procedure: Venia Minks DILATION;  Surgeon: Daneil Dolin, MD;  [facility];  Service: Endoscopy;  Laterality: N/A;    There were no vitals filed for this visit.   Subjective Assessment - 07/26/21 1519     Subjective  S: I can't lay on it. It'll hurt and throb.    Pertinent History Patient is a 73 female S/P chronic right shoulder pain which started 3-4 months ago for no knwon reason. Samara Deist refered patient to occupational therapy for evaluation and treatment.    Patient Stated Goals less pain    Currently in Pain? Yes    Pain Score 6     Pain Location Shoulder    Pain Orientation Right    Pain Descriptors / Indicators Aching;Throbbing    Pain Type Chronic pain    Pain Radiating Towards from shoulder to hand    Pain Onset More than a month ago    Pain Frequency Intermittent    Aggravating Factors  laying on it, with  activity (ie. dishes)    Pain Relieving Factors nothing has helped.    Effect of Pain on Daily Activities tried to push through the pain    Multiple Pain Sites No               [facility] OT Assessment - 07/26/21 1518       Assessment   Medical Diagnosis Right chronic shoulder pain    Referring Provider (OT) Noemi Chapel, NP    Onset Date/Surgical Date --   3-4 months ago   Hand Dominance Right    Next MD Visit --   none scheduled   Prior Therapy None      Precautions   Precautions None      Restrictions   Weight Bearing Restrictions No      Balance Screen   Has the patient fallen in the past 6 months No      Home  Environment   Family/patient expects to be discharged to: Private residence      Prior  Function   Level of [place] Retired      ADL   ADL comments Difficulty with washing dishes, trying to sleep, reaching overhead or stretching down to the ground. Very limited with movement and use.      Mobility   Mobility Status Independent      Written Expression   Dominant Hand Right      Vision - History   Baseline Vision Wears glasses all the time      Cognition   Overall Cognitive Status Within Functional Limits for tasks assessed      Observation/Other Assessments   Focus on Therapeutic Outcomes (FOTO)  48/100      Posture/Postural Control   Posture/Postural Control Postural limitations    Postural Limitations Forward head;Rounded Shoulders      ROM / Strength   AROM / PROM / Strength AROM;PROM;Strength      Palpation   Palpation comment Max fascial restrictions palpated in the right upper arm, upper trapezius, and scapularis region region.      AROM   Overall AROM Comments Assessed seated. IR/er aducted    AROM Assessment Site Shoulder    Right/Left Shoulder Right    Right Shoulder Flexion 80 Degrees    Right Shoulder ABduction 60 Degrees    Right Shoulder Internal Rotation 90 Degrees    Right Shoulder External Rotation 55 Degrees      PROM   Overall PROM Comments Assessed supine. IR/er adducted    PROM Assessment Site Shoulder    Right/Left Shoulder Right    Right Shoulder Flexion 82 Degrees    Right Shoulder ABduction 105 Degrees    Right Shoulder Internal Rotation 90 Degrees    Right Shoulder External Rotation 45 Degrees      Strength   Overall Strength Comments Assessed seated. IR/er adducted    Strength Assessment Site Shoulder    Right/Left Shoulder Right    Right Shoulder Flexion 3-/5    Right Shoulder ABduction 3-/5    Right Shoulder Internal Rotation 3/5    Right Shoulder External Rotation 3-/5                              OT Education - 07/26/21 1620     Education Details table slides.  Discussed muscle guarding, shoulder anatomy, use of heat or ice for pain management, plan for therapy.    Person(s)  Educated Patient    Methods Explanation;Demonstration;Handout;Verbal cues;Tactile cues    Comprehension Returned demonstration;Verbalized understanding              OT Short Term Goals - 07/26/21 1624       OT SHORT TERM GOAL #1   Title Pt will be educated and independent with HEP in order to return to using her RUE for at least 75% of daily tasks as her dominant extremity.    Time 3    Period Weeks    Status New    Target Date 08/16/21      OT SHORT TERM GOAL #2   Title Pt will increase her RUE P/ROM to [facility] in order to increase the ability to complete dressing and bathing tasks with less difficulty.    Time 3    Period Weeks    Status New      OT SHORT TERM GOAL #3   Title Pt will decrease her RUE fascial restrictions to moderate amount in order to increase the functional mobility needed to complete reaching tasks above shoulder level.    Time 3    Period Weeks    Status New               OT Long Term Goals - 07/26/21 1626       OT LONG TERM GOAL #1   Title Pt will increase her RUE A/ROM to [facility] in order to reach overhead and complete daily tasks that require high level reaching.    Time 6    Period Weeks    Status New    Target Date 09/06/21      OT LONG TERM GOAL #2   Title Pt will increase her RUE shoulder strength to 4+/5 in order to increase her ability to lift and manage moderate weighted items.    Time 6    Period Weeks    Status New      OT LONG TERM GOAL #3   Title Pt will report a decrease in pain level of approximately 3/10 or less when using her right arm to complete daily tasks.    Time 6    Period Weeks    Status New      OT LONG TERM GOAL #4   Title Patient will decrease her RUE fascial restrictions to min amount or less in order to increase the functional mobility needed to complete upper body dressing tasks and grooming her  hair.    Time 6    Period Weeks    Status New                   Plan - 07/26/21 1622     Clinical Impression Statement A: Patient is a 72 y/o female S/P right shoulder pain causing increased muscle guarding, fascial restrictions, and decreased ROM and strength resulting in severe difficulty using her RUE as her dominant extremity.    OT Occupational Profile and History Problem Focused Assessment - Including review of records relating to presenting problem    Occupational performance deficits (Please refer to evaluation for details): ADL's;IADL's;Rest and Sleep    Body Structure / Function / Physical Skills ADL;Strength;Pain;UE functional use;ROM;Fascial restriction    Rehab Potential Excellent    Clinical Decision Making Limited treatment options, no task modification necessary    Comorbidities Affecting Occupational Performance: May have comorbidities impacting occupational performance    Modification or Assistance to Complete Evaluation  No modification of tasks or assist necessary to  complete eval    OT Frequency 2x / week    OT Duration 6 weeks    OT Treatment/Interventions Self-care/ADL training;Moist Heat;DME and/or AE instruction;Therapeutic activities;Ultrasound;Therapeutic exercise;Passive range of motion;Neuromuscular education;Cryotherapy;Electrical Stimulation;Manual Therapy;Patient/family education    Plan P: Pt will benefit from skilled OT services to increase functional use of her RUE and return to using it for all daily tasks. Treatment Plan: Myofascial release, gentle passive ROM, AA/ROM, A/ROM, shoulder and scapular strengthening. Modalities PRN.    OT Home Exercise Plan eva: table slides    Consulted and Agree with Plan of Care Patient             Patient will benefit from skilled therapeutic intervention in order to improve the following deficits and impairments:   Body Structure / Function / Physical Skills: ADL, Strength, Pain, UE functional use, ROM,  Fascial restriction       Visit Diagnosis: Other symptoms and signs involving the musculoskeletal system - Plan: Ot plan of care cert/re-cert  Stiffness of right shoulder, not elsewhere classified - Plan: Ot plan of care cert/re-cert  Chronic right shoulder pain - Plan: Ot plan of care cert/re-cert    Problem List Patient Active Problem List   Diagnosis Date Noted   Esophageal dysphagia 08/28/2019   Colon cancer screening    Hypomagnesemia 04/29/2019   C. difficile colitis 04/17/2019   Hypokalemia    Colitis    Diarrhea of presumed infectious origin    Pancolitis ([place]) 03/11/2019   Normocytic anemia 03/11/2019   Complex regional pain syndrome type 1 of right lower extremity 10/22/2018   Small bowel obstruction due to adhesions [facility]) 06/22/2016   GERD 08/31/2009   DYSPHAGIA UNSPECIFIED 08/31/2009   Ailene Ravel, OTR/L,CBIS  [phone]  07/26/2021, 4:29 PM  [place] [address] [place], [place], [address] Phone: [phone]   Fax:  [phone]  Name: Rebecca Osborne MRN: [id number] Date of Birth: [birth year]

## 2021-07-26 NOTE — Patient Instructions (Signed)
Complete 1-2 times a day.   1) SHOULDER: Flexion On Table   Place hands on towel placed on table, elbows straight. Lean forward with you upper body, pushing towel away from body.  _10-12__ reps per set  2) Abduction (Passive)   With arm out to side, resting on towel placed on table with palm DOWN, keeping trunk away from table, lean to the side while pushing towel away from body.  Repeat ___10-12_ times.   Copyright  VHI. All rights reserved.     3) Internal Rotation (Assistive)   Seated with elbow bent at right angle and held against side, slide arm on table surface in an inward arc keeping elbow anchored in place. Repeat __10-12__ times.  Activity: Use this motion to brush crumbs off the table.  Copyright  VHI. All rights reserved.

## 2021-07-27 ENCOUNTER — Ambulatory Visit (HOSPITAL_COMMUNITY): Payer: Medicare Other

## 2021-07-27 ENCOUNTER — Encounter (HOSPITAL_COMMUNITY): Payer: Self-pay

## 2021-07-27 DIAGNOSIS — M25511 Pain in right shoulder: Secondary | ICD-10-CM | POA: Diagnosis not present

## 2021-07-27 DIAGNOSIS — G8929 Other chronic pain: Secondary | ICD-10-CM

## 2021-07-27 DIAGNOSIS — M25611 Stiffness of right shoulder, not elsewhere classified: Secondary | ICD-10-CM | POA: Diagnosis not present

## 2021-07-27 DIAGNOSIS — R29898 Other symptoms and signs involving the musculoskeletal system: Secondary | ICD-10-CM

## 2021-07-27 NOTE — Therapy (Signed)
[place], [place], [address] Phone: [phone]   Fax:  [phone]  Occupational Therapy Treatment  Patient Details  Name: Rebecca Osborne MRN: [id number] Date of Birth: 1950/10/25 Referring Provider (OT): Noemi Chapel, NP   Encounter Date: 07/27/2021   OT End of Session - 07/27/21 1501     Visit Number 2    Number of Visits 12    Date for OT Re-Evaluation 09/06/21    Authorization Type [employer] medicare    Progress Note Due on Visit 10    OT Start Time 1430    OT Stop Time 1510    OT Time Calculation (min) 40 min    Activity Tolerance Patient limited by pain;Patient tolerated treatment well    Behavior During Therapy [facility] for tasks assessed/performed             Past Medical History:  Diagnosis Date   Allergy    Cancer ([place])    cervical cancer cells   GERD (gastroesophageal reflux disease)    Osteopenia     Past Surgical History:  Procedure Laterality Date   ABDOMINAL HYSTERECTOMY     BIOPSY  10/16/2019   Procedure: BIOPSY;  Surgeon: Daneil Dolin, MD;  [facility];  Service: Endoscopy;;   BOWEL RESECTION N/A 06/22/2016   Procedure: SMALL BOWEL RESECTION;  Surgeon: Vickie Epley, MD;  Location[facility];  Service: General;  Laterality: N/A;   CHOLECYSTECTOMY  2005   COLONOSCOPY  08/23/2019   Tortuous left colon, external and internal hemorrhoids   COLONOSCOPY N/A 08/23/2019   Procedure: COLONOSCOPY;  Surgeon: Danie Binder, MD;  [facility];  Service: Endoscopy;  Laterality: N/A;  8:30   COLONOSCOPY WITH ESOPHAGOGASTRODUODENOSCOPY (EGD)  09/2009   Fields: Normal colonoscopy.  H. pylori gastritis.  Schatzki ring status post esophageal dilation.  Next colonoscopy 10 years   ESOPHAGOGASTRODUODENOSCOPY N/A 10/16/2019   Rourk: Erosive reflux esophagitis, gastric erosions, duodenal diverticulum.  Biopsies from the stomach showed mild inflammation.  No H. pylori.   FRACTURE  SURGERY     rt foot   LAPAROTOMY N/A 06/22/2016   Procedure: EXPLORATORY LAPAROTOMY;  Surgeon: Vickie Epley, MD;  Location: [facility];  Service: General;  Laterality: N/A;   LYSIS OF ADHESION N/A 06/22/2016   Procedure: LYSIS OF ADHESION;  Surgeon: Vickie Epley, MD;  Location: [facility];  Service: General;  Laterality: N/A;   MALONEY DILATION N/A 10/16/2019   Procedure: Venia Minks DILATION;  Surgeon: Daneil Dolin, MD;  [facility];  Service: Endoscopy;  Laterality: N/A;    There were no vitals filed for this visit.   Subjective Assessment - 07/27/21 1435     Currently in Pain? Yes    Pain Score 7     Pain Location Shoulder    Pain Orientation Right    Pain Descriptors / Indicators Sore    Pain Type Chronic pain    Pain Onset Today    Pain Frequency Intermittent    Aggravating Factors  starting new HEP this morning    Pain Relieving Factors Nothing has been used.    Effect of Pain on Daily Activities Tries to push through the pain    Multiple Pain Sites No                [facility] OT Assessment - 07/27/21 1439       Assessment   Medical Diagnosis Right chronic shoulder pain  Precautions   Precautions None                      OT Treatments/Exercises (OP) - 07/27/21 1453       Exercises   Exercises Shoulder      Shoulder Exercises: Supine   Protraction PROM;5 reps    Horizontal ABduction PROM;5 reps    External Rotation PROM;5 reps    Internal Rotation PROM;5 reps    Flexion PROM;5 reps    ABduction PROM;5 reps      Shoulder Exercises: Seated   Extension AROM;10 reps    Row AROM;10 reps    Other Seated Exercises scapular depression; A/ROM, 10X      Shoulder Exercises: ROM/Strengthening   Thumb Tacks 1' low level      Shoulder Exercises: Isometric Strengthening   Flexion Supine;3X5"    Extension Supine;3X5"    External Rotation Supine;3X5"    Internal Rotation Supine;3X5"    ABduction Supine;3X5"    ADduction Supine;3X5"       Manual Therapy   Manual Therapy Myofascial release    Manual therapy comments performed seperate form other interventions    Myofascial Release Myofascial release and manual stretching completed to right upper arm, upper trapezious, and scapularis region to decrease fascial restrictions and increase joint mobility in pain free zone.                    OT Education - 07/27/21 1501     Education Details reviewed OT goals    Person(s) Educated Patient    Methods Explanation    Comprehension Verbalized understanding              OT Short Term Goals - 07/27/21 1436       OT SHORT TERM GOAL #1   Title Pt will be educated and independent with HEP in order to return to using her RUE for at least 75% of daily tasks as her dominant extremity.    Time 3    Period Weeks    Status On-going    Target Date 08/16/21      OT SHORT TERM GOAL #2   Title Pt will increase her RUE P/ROM to [facility] in order to increase the ability to complete dressing and bathing tasks with less difficulty.    Time 3    Period Weeks    Status On-going      OT SHORT TERM GOAL #3   Title Pt will decrease her RUE fascial restrictions to moderate amount in order to increase the functional mobility needed to complete reaching tasks above shoulder level.    Time 3    Period Weeks    Status On-going               OT Long Term Goals - 07/27/21 1437       OT LONG TERM GOAL #1   Title Pt will increase her RUE A/ROM to [facility] in order to reach overhead and complete daily tasks that require high level reaching.    Time 6    Period Weeks    Status On-going    Target Date 09/06/21      OT LONG TERM GOAL #2   Title Pt will increase her RUE shoulder strength to 4+/5 in order to increase her ability to lift and manage moderate weighted items.    Time 6    Period Weeks    Status On-going      OT  LONG TERM GOAL #3   Title Pt will report a decrease in pain level of approximately 3/10 or less when using  her right arm to complete daily tasks.    Time 6    Period Weeks    Status On-going      OT LONG TERM GOAL #4   Title Patient will decrease her RUE fascial restrictions to min amount or less in order to increase the functional mobility needed to complete upper body dressing tasks and grooming her hair.    Time 6    Period Weeks    Status On-going                   Plan - 07/27/21 1503     Clinical Impression Statement A; Initiated myofascial release and  manual stretching to the right UE. Worked on decreasing muscle guarding with patient demonstrating soem carry over of of education. Supine isometrics were completed to focus on strengthening. VC for fom and technique were provided during session.    Body Structure / Function / Physical Skills ADL;Strength;Pain;UE functional use;ROM;Fascial restriction    Plan P: Continue with P/ROM. Add therapy ball stretches. Continue to work on decreasing muscle guarding and shoulder elevation.    Consulted and Agree with Plan of Care Patient             Patient will benefit from skilled therapeutic intervention in order to improve the following deficits and impairments:   Body Structure / Function / Physical Skills: ADL, Strength, Pain, UE functional use, ROM, Fascial restriction       Visit Diagnosis: Stiffness of right shoulder, not elsewhere classified  Chronic right shoulder pain  Other symptoms and signs involving the musculoskeletal system    Problem List Patient Active Problem List   Diagnosis Date Noted   Esophageal dysphagia 08/28/2019   Colon cancer screening    Hypomagnesemia 04/29/2019   C. difficile colitis 04/17/2019   Hypokalemia    Colitis    Diarrhea of presumed infectious origin    Pancolitis ([place]) 03/11/2019   Normocytic anemia 03/11/2019   Complex regional pain syndrome type 1 of right lower extremity 10/22/2018   Small bowel obstruction due to adhesions ([place]) 06/22/2016   GERD 08/31/2009    DYSPHAGIA UNSPECIFIED 08/31/2009    Ailene Ravel, OTR/L,CBIS  [phone]  07/27/2021, 3:17 PM  [place] [address] [place], [place], [address] Phone: [phone]   Fax:  [phone]  Name: Rebecca Osborne MRN: [id number] Date of Birth: [birth year]

## 2021-08-02 ENCOUNTER — Encounter (HOSPITAL_COMMUNITY): Payer: Self-pay | Admitting: Occupational Therapy

## 2021-08-02 ENCOUNTER — Ambulatory Visit (HOSPITAL_COMMUNITY): Payer: Medicare Other | Admitting: Occupational Therapy

## 2021-08-02 ENCOUNTER — Other Ambulatory Visit: Payer: Self-pay

## 2021-08-02 DIAGNOSIS — M25611 Stiffness of right shoulder, not elsewhere classified: Secondary | ICD-10-CM

## 2021-08-02 DIAGNOSIS — G8929 Other chronic pain: Secondary | ICD-10-CM | POA: Diagnosis not present

## 2021-08-02 DIAGNOSIS — R29898 Other symptoms and signs involving the musculoskeletal system: Secondary | ICD-10-CM | POA: Diagnosis not present

## 2021-08-02 DIAGNOSIS — M25511 Pain in right shoulder: Secondary | ICD-10-CM | POA: Diagnosis not present

## 2021-08-02 NOTE — Patient Instructions (Signed)
°  English       SCAPULAR DEPRESSIONS  Start by squeezing your shoulder blades together. Next, push your arm down towards the floor. As you do this, your shoulder should drop a few inches.   Keep your elbow straight and wrists extended the entire time.     Repeat 10 Times Hold __________ Complete 3 Sets Perform 3 Times a Day    Flip video          Apply to All Below       Reset this exercise      Remove this exercise

## 2021-08-02 NOTE — Therapy (Signed)
[place], [place], [address] Phone: [phone]   Fax:  [phone]  Occupational Therapy Treatment  Patient Details  Name: Rebecca Osborne MRN: [id number] Date of Birth: 1951/01/22 Referring Provider (OT): Noemi Chapel, NP   Encounter Date: 08/02/2021   OT End of Session - 08/02/21 1551     Visit Number 3    Number of Visits 12    Date for OT Re-Evaluation 09/06/21    Authorization Type [employer] medicare    Progress Note Due on Visit 10    OT Start Time 1516    OT Stop Time 1600    OT Time Calculation (min) 44 min    Activity Tolerance Patient limited by pain;Patient tolerated treatment well    Behavior During Therapy [facility] for tasks assessed/performed             Past Medical History:  Diagnosis Date   Allergy    Cancer ([place])    cervical cancer cells   GERD (gastroesophageal reflux disease)    Osteopenia     Past Surgical History:  Procedure Laterality Date   ABDOMINAL HYSTERECTOMY     BIOPSY  10/16/2019   Procedure: BIOPSY;  Surgeon: Daneil Dolin, MD;  [facility];  Service: Endoscopy;;   BOWEL RESECTION N/A 06/22/2016   Procedure: SMALL BOWEL RESECTION;  Surgeon: Vickie Epley, MD;  Location: [facility];  Service: General;  Laterality: N/A;   CHOLECYSTECTOMY  2005   COLONOSCOPY  08/23/2019   Tortuous left colon, external and internal hemorrhoids   COLONOSCOPY N/A 08/23/2019   Procedure: COLONOSCOPY;  Surgeon: Danie Binder, MD;  [facility];  Service: Endoscopy;  Laterality: N/A;  8:30   COLONOSCOPY WITH ESOPHAGOGASTRODUODENOSCOPY (EGD)  09/2009   Fields: Normal colonoscopy.  H. pylori gastritis.  Schatzki ring status post esophageal dilation.  Next colonoscopy 10 years   ESOPHAGOGASTRODUODENOSCOPY N/A 10/16/2019   Rourk: Erosive reflux esophagitis, gastric erosions, duodenal diverticulum.  Biopsies from the stomach showed mild inflammation.  No H. pylori.   FRACTURE  SURGERY     rt foot   LAPAROTOMY N/A 06/22/2016   Procedure: EXPLORATORY LAPAROTOMY;  Surgeon: Vickie Epley, MD;  Location: [facility] ORS;  Service: General;  Laterality: N/A;   LYSIS OF ADHESION N/A 06/22/2016   Procedure: LYSIS OF ADHESION;  Surgeon: Vickie Epley, MD;  Location: [facility] ORS;  Service: General;  Laterality: N/A;   MALONEY DILATION N/A 10/16/2019   Procedure: Venia Minks DILATION;  Surgeon: Daneil Dolin, MD;  Location: [facility];  Service: Endoscopy;  Laterality: N/A;    There were no vitals filed for this visit.   Subjective Assessment - 08/02/21 1515     Subjective  S: Still sore.    Currently in Pain? Yes    Pain Score 6     Pain Location Shoulder    Pain Orientation Right    Pain Descriptors / Indicators Sore    Pain Type Chronic pain    Pain Radiating Towards from shoulder to hand    Pain Onset Today    Pain Frequency Intermittent    Aggravating Factors  Going to bed.    Pain Relieving Factors OTC meds    Effect of Pain on Daily Activities minimal    Multiple Pain Sites No                [facility] OT Assessment - 08/02/21 0001  Assessment   Medical Diagnosis Right chronic shoulder pain      Precautions   Precautions None                      OT Treatments/Exercises (OP) - 08/02/21 0001       Exercises   Exercises Shoulder      Shoulder Exercises: Supine   Protraction PROM;5 reps    Horizontal ABduction PROM;5 reps    External Rotation PROM;5 reps    Internal Rotation PROM;5 reps    Flexion PROM;5 reps    ABduction PROM;5 reps      Shoulder Exercises: Seated   Other Seated Exercises scapular depression; A/ROM, 20X; use of mirror      Shoulder Exercises: Isometric Strengthening   Flexion Supine;3X5"    Extension Supine;3X5"    External Rotation Supine;3X5"    Internal Rotation Supine;3X5"    ABduction Supine;3X5"    ADduction Supine;3X5"      Shoulder Exercises: Stretch   Other Shoulder Stretches theraball flexion  and abduction strecth x10 reps with 3" hold.      Manual Therapy   Manual Therapy Myofascial release    Manual therapy comments performed seperate form other interventions    Myofascial Release Myofascial release and manual stretching completed to right upper arm, upper trapezious, and scapularis region to decrease fascial restrictions and increase joint mobility in pain free zone. Trigger point use as well in trapezious area.                    OT Education - 08/02/21 1605     Education Details Scapular depression.    Person(s) Educated Patient    Methods Explanation;Handout;Demonstration;Tactile cues;Verbal cues    Comprehension Verbalized understanding;Returned demonstration              OT Short Term Goals - 07/27/21 1436       OT SHORT TERM GOAL #1   Title Pt will be educated and independent with HEP in order to return to using her RUE for at least 75% of daily tasks as her dominant extremity.    Time 3    Period Weeks    Status On-going    Target Date 08/16/21      OT SHORT TERM GOAL #2   Title Pt will increase her RUE P/ROM to [facility] in order to increase the ability to complete dressing and bathing tasks with less difficulty.    Time 3    Period Weeks    Status On-going      OT SHORT TERM GOAL #3   Title Pt will decrease her RUE fascial restrictions to moderate amount in order to increase the functional mobility needed to complete reaching tasks above shoulder level.    Time 3    Period Weeks    Status On-going               OT Long Term Goals - 07/27/21 1437       OT LONG TERM GOAL #1   Title Pt will increase her RUE A/ROM to [facility] in order to reach overhead and complete daily tasks that require high level reaching.    Time 6    Period Weeks    Status On-going    Target Date 09/06/21      OT LONG TERM GOAL #2   Title Pt will increase her RUE shoulder strength to 4+/5 in order to increase her ability to lift and manage moderate  weighted items.     Time 6    Period Weeks    Status On-going      OT LONG TERM GOAL #3   Title Pt will report a decrease in pain level of approximately 3/10 or less when using her right arm to complete daily tasks.    Time 6    Period Weeks    Status On-going      OT LONG TERM GOAL #4   Title Patient will decrease her RUE fascial restrictions to min amount or less in order to increase the functional mobility needed to complete upper body dressing tasks and grooming her hair.    Time 6    Period Weeks    Status On-going                   Plan - 08/02/21 1605     Clinical Impression Statement A: Myofascial release and manual stretching to right UE with pt reported improvement in pain with repetitions of P/ROM. Pt supine isometrics were continued followed by P/ROM via theraball and focus on scapular depression. Mirror used for scapular depression with good benefit to improved performance.    Body Structure / Function / Physical Skills ADL;Strength;Pain;UE functional use;ROM;Fascial restriction    OT Treatment/Interventions Self-care/ADL training;Moist Heat;DME and/or AE instruction;Therapeutic activities;Ultrasound;Therapeutic exercise;Passive range of motion;Neuromuscular education;Cryotherapy;Electrical Stimulation;Manual Therapy;Patient/family education    Plan P: Continue with P/ROM. Add therapy ball stretches. Ask pt about use of mirror for scapular dpression exercies. Progress to A/AROM if tolerable.    OT Home Exercise Plan eva: table slides; 2/27: scapular depression.    Consulted and Agree with Plan of Care Patient             Patient will benefit from skilled therapeutic intervention in order to improve the following deficits and impairments:   Body Structure / Function / Physical Skills: ADL, Strength, Pain, UE functional use, ROM, Fascial restriction       Visit Diagnosis: Stiffness of right shoulder, not elsewhere classified  Chronic right shoulder pain  Other symptoms  and signs involving the musculoskeletal system    Problem List Patient Active Problem List   Diagnosis Date Noted   Esophageal dysphagia 08/28/2019   Colon cancer screening    Hypomagnesemia 04/29/2019   C. 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

## 2021-08-10 ENCOUNTER — Encounter (HOSPITAL_COMMUNITY): Payer: Self-pay | Admitting: Occupational Therapy

## 2021-08-10 ENCOUNTER — Ambulatory Visit (HOSPITAL_COMMUNITY): Payer: Medicare Other | Attending: Nurse Practitioner | Admitting: Occupational Therapy

## 2021-08-10 ENCOUNTER — Other Ambulatory Visit: Payer: Self-pay

## 2021-08-10 DIAGNOSIS — G8929 Other chronic pain: Secondary | ICD-10-CM | POA: Diagnosis not present

## 2021-08-10 DIAGNOSIS — M25611 Stiffness of right shoulder, not elsewhere classified: Secondary | ICD-10-CM | POA: Diagnosis not present

## 2021-08-10 DIAGNOSIS — M25511 Pain in right shoulder: Secondary | ICD-10-CM

## 2021-08-10 DIAGNOSIS — R29898 Other symptoms and signs involving the musculoskeletal system: Secondary | ICD-10-CM | POA: Insufficient documentation

## 2021-08-10 NOTE — Patient Instructions (Signed)
Perform each exercise ____10-15____ reps. 2-3x days.  ?Do all in supine at this stage.  ?1) Protraction  ? ?Start by holding a wand or cane at chest height. ? ?Next, slowly push the wand outwards in front of your body so that your elbows become fully straightened. Then, return to the original position.  ? ? ? ?2) Shoulder FLEXION  ? ?In the supine position, hold a wand/cane with both arms, palms down on both sides. Raise up the wand/cane allowing your unaffected arm to perform most of the effort. Your affected arm should be partially relaxed.   ? ? ? ?3) Internal/External ROTATION  ? ?In the supine position, hold a wand/cane with both hands keeping your elbows bent. Move your arms and wand/cane to one side.  Your affected arm should be partially relaxed while your unaffected arm performs most of the effort.   ? ? ? ? ?4) Shoulder ABDUCTION  ? ?While holding a wand/cane palm face up on the injured side and palm face down on the uninjured side, slowly raise up your injured arm to the side. ? ? ? ? ? ? ? ?5) Horizontal Abduction/Adduction ? ? ? ? ? ?Straight arms holding cane at shoulder height, bring cane to right, center, left. Repeat starting to left. ? ? ?Copyright ? V[employer]. All rights reserved.  ? ? ?  ?

## 2021-08-10 NOTE — Therapy (Signed)
[place] ?[facility] ?[address] ?[place], [place], [address] ?Phone: [phone]   Fax:  [phone] ? ?Occupational Therapy Treatment ? ?Patient Details  ?Name: Rebecca Osborne ?MRN: [id number] ?Date of Birth: 1951/01/05 ?Referring Provider (OT): Cathlean Marseilles, NP ? ? ?Encounter Date: 08/10/2021 ? ? OT End of Session - 08/10/21 1603   ? ? Visit Number 4   ? Number of Visits 12   ? Date for OT Re-Evaluation 09/06/21   ? Authorization Type [employer] medicare   ? Progress Note Due on Visit 10   ? OT Start Time 1514   ? OT Stop Time 1559   ? OT Time Calculation (min) 45 min   ? Activity Tolerance Patient limited by pain;Patient tolerated treatment well   ? Behavior During Therapy [facility] for tasks assessed/performed   ? ?  ?  ? ?  ? ? ?Past Medical History:  ?Diagnosis Date  ? Allergy   ? Cancer [facility])   ? cervical cancer cells  ? GERD (gastroesophageal reflux disease)   ? Osteopenia   ? ? ?Past Surgical History:  ?Procedure Laterality Date  ? ABDOMINAL HYSTERECTOMY    ? BIOPSY  10/16/2019  ? Procedure: BIOPSY;  Surgeon: Corbin Ade, MD;  [facility];  Service: Endoscopy;;  ? BOWEL RESECTION N/A 06/22/2016  ? Procedure: SMALL BOWEL RESECTION;  Surgeon: Ancil Linsey, MD;  Location: [facility];  Service: General;  Laterality: N/A;  ? CHOLECYSTECTOMY  2005  ? COLONOSCOPY  08/23/2019  ? Tortuous left colon, external and internal hemorrhoids  ? COLONOSCOPY N/A 08/23/2019  ? Procedure: COLONOSCOPY;  Surgeon: West Bali, MD;  Location[facility];  Service: Endoscopy;  Laterality: N/A;  8:30  ? COLONOSCOPY WITH ESOPHAGOGASTRODUODENOSCOPY (EGD)  09/2009  ? Fields: Normal colonoscopy.  H. pylori gastritis.  Schatzki ring status post esophageal dilation.  Next colonoscopy 10 years  ? ESOPHAGOGASTRODUODENOSCOPY N/A 10/16/2019  ? Rourk: Erosive reflux esophagitis, gastric erosions, duodenal diverticulum.  Biopsies from the stomach showed mild inflammation.  No H. pylori.  ? FRACTURE  SURGERY    ? rt foot  ? LAPAROTOMY N/A 06/22/2016  ? Procedure: EXPLORATORY LAPAROTOMY;  Surgeon: Ancil Linsey, MD;  Location: [facility];  Service: General;  Laterality: N/A;  ? LYSIS OF ADHESION N/A 06/22/2016  ? Procedure: LYSIS OF ADHESION;  Surgeon: Ancil Linsey, MD;  Location: [facility];  Service: General;  Laterality: N/A;  ? MALONEY DILATION N/A 10/16/2019  ? Procedure: MALONEY DILATION;  Surgeon: Corbin Ade, MD;  [facility];  Service: Endoscopy;  Laterality: N/A;  ? ? ?There were no vitals filed for this visit. ? ? Subjective Assessment - 08/10/21 1514   ? ? Subjective  S: R shoulder is swollen today.   ? Currently in Pain? Yes   ? Pain Score 5    ? Pain Location Shoulder   ? Pain Orientation Right   ? Pain Descriptors / Indicators Sore   ? Pain Type Chronic pain   ? Pain Radiating Towards shoulder to hand   ? Pain Onset More than a month ago   ? Pain Frequency Intermittent   ? Aggravating Factors  Going to bed   ? Pain Relieving Factors OTC meds   ? Effect of Pain on Daily Activities minimal   ? Multiple Pain Sites No   ? ?  ?  ? ?  ? ? ? ? ? [facility] OT Assessment - 08/10/21 0001   ? ?  ?  Assessment  ? Medical Diagnosis Right chronic shoulder pain   ? Referring Provider (OT) Cathlean Marseilles, NP   ?  ? Precautions  ? Precautions None   ? ?  ?  ? ?  ? ? ? ? ? ? ? ? ? ? ? OT Treatments/Exercises (OP) - 08/10/21 0001   ? ?  ? Exercises  ? Exercises Shoulder   ?  ? Shoulder Exercises: Supine  ? Protraction PROM;AAROM;10 reps   ? Horizontal ABduction PROM;10 reps;AAROM   ? External Rotation PROM;10 reps;AAROM   ? Internal Rotation PROM;10 reps;AAROM   ? Flexion PROM;10 reps;AAROM   ? ABduction PROM;10 reps;AAROM   ?  ? Shoulder Exercises: Seated  ? Other Seated Exercises scapular depression and elevation; A/ROM, 20X; use of mirror   ?  ? Manual Therapy  ? Manual Therapy Myofascial release   ? Manual therapy comments performed seperate form other interventions   ? Myofascial Release Myofascial  release and manual stretching completed to right upper arm, upper trapezious, and scapularis region to decrease fascial restrictions and increase joint mobility in pain free zone. .   ? ?  ?  ? ?  ? ? ? ? ? ? ? ? ? ? ? OT Short Term Goals - 07/27/21 1436   ? ?  ? OT SHORT TERM GOAL #1  ? Title Pt will be educated and independent with HEP in order to return to using her RUE for at least 75% of daily tasks as her dominant extremity.   ? Time 3   ? Period Weeks   ? Status On-going   ? Target Date 08/16/21   ?  ? OT SHORT TERM GOAL #2  ? Title Pt will increase her RUE P/ROM to [facility] in order to increase the ability to complete dressing and bathing tasks with less difficulty.   ? Time 3   ? Period Weeks   ? Status On-going   ?  ? OT SHORT TERM GOAL #3  ? Title Pt will decrease her RUE fascial restrictions to moderate amount in order to increase the functional mobility needed to complete reaching tasks above shoulder level.   ? Time 3   ? Period Weeks   ? Status On-going   ? ?  ?  ? ?  ? ? ? ? OT Long Term Goals - 07/27/21 1437   ? ?  ? OT LONG TERM GOAL #1  ? Title Pt will increase her RUE A/ROM to [facility] in order to reach overhead and complete daily tasks that require high level reaching.   ? Time 6   ? Period Weeks   ? Status On-going   ? Target Date 09/06/21   ?  ? OT LONG TERM GOAL #2  ? Title Pt will increase her RUE shoulder strength to 4+/5 in order to increase her ability to lift and manage moderate weighted items.   ? Time 6   ? Period Weeks   ? Status On-going   ?  ? OT LONG TERM GOAL #3  ? Title Pt will report a decrease in pain level of approximately 3/10 or less when using her right arm to complete daily tasks.   ? Time 6   ? Period Weeks   ? Status On-going   ?  ? OT LONG TERM GOAL #4  ? Title Patient will decrease her RUE fascial restrictions to min amount or less in order to increase the functional mobility needed to complete upper  body dressing tasks and grooming her hair.   ? Time 6   ? Period Weeks   ?  Status On-going   ? ?  ?  ? ?  ? ? ? ? ? ? ? ? Plan - 08/10/21 1604   ? ? Clinical Impression Statement A: Myofascial release and manual stretching completed to R UE. Pt was still very tender during myofascial release but reported decrease in pain during and after AA/ROM supine on mat. Pt demonstrates improved scapular depression by presenting with a more nuetral position rather than in constant shoulder elevation.   ? Body Structure / Function / Physical Skills ADL;Strength;Pain;UE functional use;ROM;Fascial restriction   ? Plan P: Continue with myofascial release, P/ROM, and supine AA/ROM. Possibly progressing to AA/ROM in standing and proximal shoulder girdle strengthening.   ? OT Home Exercise Plan eva: table slides; 2/27: scapular depression. 3/7: AA/ROM shoulder   ? Consulted and Agree with Plan of Care Patient   ? ?  ?  ? ?  ? ? ?Patient will benefit from skilled therapeutic intervention in order to improve the following deficits and impairments:   ?Body Structure / Function / Physical Skills: ADL, Strength, Pain, UE functional use, ROM, Fascial restriction ?  ?  ? ? ?Visit Diagnosis: ?Stiffness of right shoulder, not elsewhere classified ? ?Chronic right shoulder pain ? ?Other symptoms and signs involving the musculoskeletal system ? ? ? ?Problem List ?Patient Active Problem List  ? Diagnosis Date Noted  ? Esophageal dysphagia 08/28/2019  ? Colon cancer screening   ? Hypomagnesemia 04/29/2019  ? C. difficile colitis 04/17/2019  ? Hypokalemia   ? Colitis   ? Diarrhea of presumed infectious origin   ? Pancolitis ([facility]) 03/11/2019  ? Normocytic anemia 03/11/2019  ? Complex regional pain syndrome type 1 of right lower extremity 10/22/2018  ? Small bowel obstruction due to adhesions ([facility]) 06/22/2016  ? GERD 08/31/2009  ? DYSPHAGIA UNSPECIFIED 08/31/2009  ? ?Wannetta Langland OT, MOT ? ?Danie Chandler, OT ?08/10/2021, 4:17 PM ? ?[place] ?[facility] ?[address] ?[place], [place], [address] ?Phone: [phone]   Fax:  [phone] ? ?Name: SUSETTE DEMEDEIROS ?MRN: [id number] ?Date of Birth: [birth year] ? ?

## 2021-08-12 ENCOUNTER — Encounter (HOSPITAL_COMMUNITY): Payer: Self-pay

## 2021-08-12 ENCOUNTER — Ambulatory Visit (HOSPITAL_COMMUNITY): Payer: Medicare Other

## 2021-08-12 ENCOUNTER — Other Ambulatory Visit: Payer: Self-pay

## 2021-08-12 DIAGNOSIS — R29898 Other symptoms and signs involving the musculoskeletal system: Secondary | ICD-10-CM | POA: Diagnosis not present

## 2021-08-12 DIAGNOSIS — M25611 Stiffness of right shoulder, not elsewhere classified: Secondary | ICD-10-CM

## 2021-08-12 DIAGNOSIS — M25511 Pain in right shoulder: Secondary | ICD-10-CM

## 2021-08-12 DIAGNOSIS — G8929 Other chronic pain: Secondary | ICD-10-CM | POA: Diagnosis not present

## 2021-08-12 NOTE — Therapy (Signed)
[place] [facility] ?[address] ?[place], [place], [address] ?Phone: [phone]   Fax:  [phone] ? ?Occupational Therapy Treatment ? ?Patient Details  ?Name: Rebecca Osborne ?MRN: [id number] ?Date of Birth: 03/02/1951 ?Referring Provider (OT): Noemi Chapel, NP ? ? ?Encounter Date: 08/12/2021 ? ? OT End of Session - 08/12/21 1453   ? ? Visit Number 5   ? Number of Visits 12   ? Date for OT Re-Evaluation 09/06/21   ? Authorization Type [employer] medicare   ? Progress Note Due on Visit 10   ? OT Start Time 1430   ? OT Stop Time 1508   ? OT Time Calculation (min) 38 min   ? Activity Tolerance Patient tolerated treatment well   ? Behavior During Therapy [facility] for tasks assessed/performed   ? ?  ?  ? ?  ? ? ?Past Medical History:  ?Diagnosis Date  ? Allergy   ? Cancer [facility])   ? cervical cancer cells  ? GERD (gastroesophageal reflux disease)   ? Osteopenia   ? ? ?Past Surgical History:  ?Procedure Laterality Date  ? ABDOMINAL HYSTERECTOMY    ? BIOPSY  10/16/2019  ? Procedure: BIOPSY;  Surgeon: Daneil Dolin, MD;  [facility];  Service: Endoscopy;;  ? BOWEL RESECTION N/A 06/22/2016  ? Procedure: SMALL BOWEL RESECTION;  Surgeon: Vickie Epley, MD;  Location: [facility] ORS;  Service: General;  Laterality: N/A;  ? CHOLECYSTECTOMY  2005  ? COLONOSCOPY  08/23/2019  ? Tortuous left colon, external and internal hemorrhoids  ? COLONOSCOPY N/A 08/23/2019  ? Procedure: COLONOSCOPY;  Surgeon: Danie Binder, MD;  Location: [facility];  Service: Endoscopy;  Laterality: N/A;  8:30  ? COLONOSCOPY WITH ESOPHAGOGASTRODUODENOSCOPY (EGD)  09/2009  ? Fields: Normal colonoscopy.  H. pylori gastritis.  Schatzki ring status post esophageal dilation.  Next colonoscopy 10 years  ? ESOPHAGOGASTRODUODENOSCOPY N/A 10/16/2019  ? Rourk: Erosive reflux esophagitis, gastric erosions, duodenal diverticulum.  Biopsies from the stomach showed mild inflammation.  No H. pylori.  ? FRACTURE SURGERY    ? rt foot  ?  LAPAROTOMY N/A 06/22/2016  ? Procedure: EXPLORATORY LAPAROTOMY;  Surgeon: Vickie Epley, MD;  Location: [facility] ORS;  Service: General;  Laterality: N/A;  ? LYSIS OF ADHESION N/A 06/22/2016  ? Procedure: LYSIS OF ADHESION;  Surgeon: Vickie Epley, MD;  Location: [facility] ORS;  Service: General;  Laterality: N/A;  ? MALONEY DILATION N/A 10/16/2019  ? Procedure: MALONEY DILATION;  Surgeon: Daneil Dolin, MD;  Location: [facility] END[facility];  Service: Endoscopy;  Laterality: N/A;  ? ? ?There were no vitals filed for this visit. ? ? Subjective Assessment - 08/12/21 1434   ? ? Subjective  S: There's one exercise I'm not sure if I'm doing right.   ? Currently in Pain? Yes   ? Pain Score 5    ? Pain Location Shoulder   ? Pain Orientation Right   ? Pain Descriptors / Indicators Sore   ? Pain Type Chronic pain   ? Pain Onset More than a month ago   ? Pain Frequency Intermittent   ? Aggravating Factors  increased use   ? Pain Relieving Factors OTC meds   ? Effect of Pain on Daily Activities minimal   ? ?  ?  ? ?  ? ? ? ? ? [facility] OT Assessment - 08/12/21 1450   ? ?  ? Assessment  ? Medical Diagnosis Right chronic shoulder pain   ?  ?  Precautions  ? Precautions None   ? ?  ?  ? ?  ? ? ? ? ? ? ? ? ? ? ? OT Treatments/Exercises (OP) - 08/12/21 1450   ? ?  ? Exercises  ? Exercises Shoulder   ?  ? Shoulder Exercises: Supine  ? Protraction PROM;5 reps   ? Horizontal ABduction PROM;5 reps   ? External Rotation PROM;5 reps   ? Internal Rotation PROM;5 reps   ? Flexion PROM;5 reps   ? ABduction PROM;5 reps   ?  ? Shoulder Exercises: Standing  ? Protraction AAROM;10 reps   ? Horizontal ABduction AAROM;10 reps   ? External Rotation AAROM;10 reps   ? Internal Rotation AAROM;10 reps   ? Flexion AAROM;10 reps   ? ABduction AAROM;10 reps   ?  ? Shoulder Exercises: Pulleys  ? Flexion 1 minute   standing  ? ABduction 1 minute   standing  ?  ? Shoulder Exercises: ROM/Strengthening  ? UBE (Upper Arm Bike) Level 1 2' reverse pace: 5.0-5.5   ? Wall Wash 1'   ?   ? Manual Therapy  ? Manual Therapy Myofascial release   ? Manual therapy comments performed seperate form other interventions   ? Myofascial Release Myofascial release and manual stretching completed to right upper arm, upper trapezious, and scapularis region to decrease fascial restrictions and increase joint mobility in pain free zone. .   ? ?  ?  ? ?  ? ? ? ? ? ? ? ? ? OT Education - 08/12/21 1452   ? ? Education Details Complete AA/ROM exercises standing   ? Person(s) Educated Patient   ? Methods Explanation   ? Comprehension Verbalized understanding   ? ?  ?  ? ?  ? ? ? OT Short Term Goals - 07/27/21 1436   ? ?  ? OT SHORT TERM GOAL #1  ? Title Pt will be educated and independent with HEP in order to return to using her RUE for at least 75% of daily tasks as her dominant extremity.   ? Time 3   ? Period Weeks   ? Status On-going   ? Target Date 08/16/21   ?  ? OT SHORT TERM GOAL #2  ? Title Pt will increase her RUE P/ROM to [facility] in order to increase the ability to complete dressing and bathing tasks with less difficulty.   ? Time 3   ? Period Weeks   ? Status On-going   ?  ? OT SHORT TERM GOAL #3  ? Title Pt will decrease her RUE fascial restrictions to moderate amount in order to increase the functional mobility needed to complete reaching tasks above shoulder level.   ? Time 3   ? Period Weeks   ? Status On-going   ? ?  ?  ? ?  ? ? ? ? OT Long Term Goals - 07/27/21 1437   ? ?  ? OT LONG TERM GOAL #1  ? Title Pt will increase her RUE A/ROM to [facility] in order to reach overhead and complete daily tasks that require high level reaching.   ? Time 6   ? Period Weeks   ? Status On-going   ? Target Date 09/06/21   ?  ? OT LONG TERM GOAL #2  ? Title Pt will increase her RUE shoulder strength to 4+/5 in order to increase her ability to lift and manage moderate weighted items.   ? Time 6   ?  Period Weeks   ? Status On-going   ?  ? OT LONG TERM GOAL #3  ? Title Pt will report a decrease in pain level of approximately 3/10  or less when using her right arm to complete daily tasks.   ? Time 6   ? Period Weeks   ? Status On-going   ?  ? OT LONG TERM GOAL #4  ? Title Patient will decrease her RUE fascial restrictions to min amount or less in order to increase the functional mobility needed to complete upper body dressing tasks and grooming her hair.   ? Time 6   ? Period Weeks   ? Status On-going   ? ?  ?  ? ?  ? ? ? ? ? ? ? ? Plan - 08/12/21 1453   ? ? Clinical Impression Statement A: manual techniques completed to address fascial restrictions. Completed AA/ROM standing only with education to complete standing at home. VC for form and technique.   ? Body Structure / Function / Physical Skills ADL;Strength;Pain;UE functional use;ROM;Fascial restriction   ? Plan P: Continue with myofascial release (more restrictions seem to be anterior shoulder). Add proximal shoulder strengthening   ? Consulted and Agree with Plan of Care Patient   ? ?  ?  ? ?  ? ? ?Patient will benefit from skilled therapeutic intervention in order to improve the following deficits and impairments:   ?Body Structure / Function / Physical Skills: ADL, Strength, Pain, UE functional use, ROM, Fascial restriction ?  ?  ? ? ?Visit Diagnosis: ?Stiffness of right shoulder, not elsewhere classified ? ?Chronic right shoulder pain ? ?Other symptoms and signs involving the musculoskeletal system ? ? ? ?Problem List ?Patient Active Problem List  ? Diagnosis Date Noted  ? Esophageal dysphagia 08/28/2019  ? Colon cancer screening   ? Hypomagnesemia 04/29/2019  ? C. difficile colitis 04/17/2019  ? Hypokalemia   ? Colitis   ? Diarrhea of presumed infectious origin   ? Pancolitis ([place]) 03/11/2019  ? Normocytic anemia 03/11/2019  ? Complex regional pain syndrome type 1 of right lower extremity 10/22/2018  ? Small bowel obstruction due to adhesions ([place]) 06/22/2016  ? GERD 08/31/2009  ? DYSPHAGIA UNSPECIFIED 08/31/2009  ? ?Ailene Ravel, OTR/L,CBIS  ?[phone] ? ?08/12/2021, 3:31  PM ? ?[place] ?[place] ?[address] ?[place], [place], [address] ?Phone: [phone]   Fax:  [phone] ? ?Name: AUDERY WASSENAAR ?MRN: [id number] ?Date of Birth: Dec 20, 1950

## 2021-08-16 ENCOUNTER — Encounter (HOSPITAL_COMMUNITY): Payer: Self-pay | Admitting: Occupational Therapy

## 2021-08-16 ENCOUNTER — Ambulatory Visit (HOSPITAL_COMMUNITY): Payer: Medicare Other | Admitting: Occupational Therapy

## 2021-08-16 ENCOUNTER — Other Ambulatory Visit: Payer: Self-pay

## 2021-08-16 DIAGNOSIS — M25611 Stiffness of right shoulder, not elsewhere classified: Secondary | ICD-10-CM

## 2021-08-16 DIAGNOSIS — R29898 Other symptoms and signs involving the musculoskeletal system: Secondary | ICD-10-CM

## 2021-08-16 DIAGNOSIS — G8929 Other chronic pain: Secondary | ICD-10-CM | POA: Diagnosis not present

## 2021-08-16 DIAGNOSIS — M25511 Pain in right shoulder: Secondary | ICD-10-CM | POA: Diagnosis not present

## 2021-08-16 NOTE — Therapy (Signed)
?OUTPATIENT OCCUPATIONAL THERAPY TREATMENT NOTE ? ? ?Patient Name: Rebecca Osborne ?MRN: [id number] ?DOB:1950-09-17, 71 y.o., female ?Today's Date: 08/16/2021 ? ?PCP: Susy Frizzle, MD ?REFERRING PROVIDER: Susy Frizzle, MD ? ? OT End of Session - 08/16/21 1511   ? ? Visit Number 6   ? Number of Visits 12   ? Date for OT Re-Evaluation 09/06/21   ? Authorization Type [employer] medicare   ? Progress Note Due on Visit 10   ? OT Start Time 1430   ? OT Stop Time 2751   ? OT Time Calculation (min) 39 min   ? Activity Tolerance Patient tolerated treatment well   ? Behavior During Therapy [facility] for tasks assessed/performed   ? ?  ?  ? ?  ? ? ?Past Medical History:  ?Diagnosis Date  ? Allergy   ? Cancer [facility])   ? cervical cancer cells  ? GERD (gastroesophageal reflux disease)   ? Osteopenia   ? ?Past Surgical History:  ?Procedure Laterality Date  ? ABDOMINAL HYSTERECTOMY    ? BIOPSY  10/16/2019  ? Procedure: BIOPSY;  Surgeon: Daneil Dolin, MD;  [facility];  Service: Endoscopy;;  ? BOWEL RESECTION N/A 06/22/2016  ? Procedure: SMALL BOWEL RESECTION;  Surgeon: Vickie Epley, MD;  Location: [facility] ORS;  Service: General;  Laterality: N/A;  ? CHOLECYSTECTOMY  2005  ? COLONOSCOPY  08/23/2019  ? Tortuous left colon, external and internal hemorrhoids  ? COLONOSCOPY N/A 08/23/2019  ? Procedure: COLONOSCOPY;  Surgeon: Danie Binder, MD;  Location: [facility];  Service: Endoscopy;  Laterality: N/A;  8:30  ? COLONOSCOPY WITH ESOPHAGOGASTRODUODENOSCOPY (EGD)  09/2009  ? Fields: Normal colonoscopy.  H. pylori gastritis.  Schatzki ring status post esophageal dilation.  Next colonoscopy 10 years  ? ESOPHAGOGASTRODUODENOSCOPY N/A 10/16/2019  ? Rourk: Erosive reflux esophagitis, gastric erosions, duodenal diverticulum.  Biopsies from the stomach showed mild inflammation.  No H. pylori.  ? FRACTURE SURGERY    ? rt foot  ? LAPAROTOMY N/A 06/22/2016  ? Procedure: EXPLORATORY LAPAROTOMY;  Surgeon: Vickie Epley, MD;  Location: [facility]  ORS;  Service: General;  Laterality: N/A;  ? LYSIS OF ADHESION N/A 06/22/2016  ? Procedure: LYSIS OF ADHESION;  Surgeon: Vickie Epley, MD;  Location: [facility] ORS;  Service: General;  Laterality: N/A;  ? MALONEY DILATION N/A 10/16/2019  ? Procedure: MALONEY DILATION;  Surgeon: Daneil Dolin, MD;  [facility]: [facility];  Service: Endoscopy;  Laterality: N/A;  ? ?Patient Active Problem List  ? Diagnosis Date Noted  ? Esophageal dysphagia 08/28/2019  ? Colon cancer screening   ? Hypomagnesemia 04/29/2019  ? C. difficile colitis 04/17/2019  ? Hypokalemia   ? Colitis   ? Diarrhea of presumed infectious origin   ? Pancolitis ([place]) 03/11/2019  ? Normocytic anemia 03/11/2019  ? Complex regional pain syndrome type 1 of right lower extremity 10/22/2018  ? Small bowel obstruction due to adhesions ([place]) 06/22/2016  ? GERD 08/31/2009  ? DYSPHAGIA UNSPECIFIED 08/31/2009  ? ? ?ONSET DATE: Approximately 04/2021 ? ?REFERRING DIAG: chronic right shoulder pain ? ?THERAPY DIAG:  ?Stiffness of right shoulder, not elsewhere classified ? ?Chronic right shoulder pain ? ?Other symptoms and signs involving the musculoskeletal system ? ? ?PERTINENT HISTORY: Patient is a 6 female S/P chronic right shoulder pain which started 3-4 months ago for no known reason. ? ?PRECAUTIONS: None ? ?SUBJECTIVE: S: I don't understand that one exercise to the side.  ? ?PAIN:  ?Are  you having pain? Yes: NPRS scale: 5/10 ?Pain location: right shoulder ?Pain description: sore ?Aggravating factors: laying on her side ?Relieving factors: unsure ? ? ? ? ?OBJECTIVE:  ? ?TODAY'S TREATMENT:  ?-Myofascial release: completed separately from therapeutic exercises. Myofascial release and manual stretching completed to right upper arm, upper trapezious, and scapularis region to decrease fascial restrictions and increase joint mobility in pain free zone ?-P/ROM-shoulder flexion, horizontal ab/adduction, er/IR, abduction, 5X each ?-AA/ROM-shoulder protraction, flexion,  horizontal ab/adduction, er/IR, abduction, 10X each supine and standing ?-Proximal shoulder strengthening in supine and standing, 10X each, no rest breaks  ?-Wall wash, 1'  ?-PVC pipe slide, 10X flexion ?-Functional Reaching: Pt placing 10 cones on middle shelf of overhead cabinet in flexion, removing in abduction.  ?-UBE, level 1 3' forward 3' reverse, pace, 5.0 ? ? ? ? ? OT Short Term Goals  ? ?  ? OT SHORT TERM GOAL #1  ? Title Pt will be educated and independent with HEP in order to return to using her RUE for at least 75% of daily tasks as her dominant extremity.   ? Time 3   ? Period Weeks   ? Status On-going   ? Target Date 08/16/21   ?  ? OT SHORT TERM GOAL #2  ? Title Pt will increase her RUE P/ROM to [facility] in order to increase the ability to complete dressing and bathing tasks with less difficulty.   ? Time 3   ? Period Weeks   ? Status On-going   ?  ? OT SHORT TERM GOAL #3  ? Title Pt will decrease her RUE fascial restrictions to moderate amount in order to increase the functional mobility needed to complete reaching tasks above shoulder level.   ? Time 3   ? Period Weeks   ? Status On-going   ? ?  ?  ? ?  ? ? ? OT Long Term Goals  ? ?  ? OT LONG TERM GOAL #1  ? Title Pt will increase her RUE A/ROM to [facility] in order to reach overhead and complete daily tasks that require high level reaching.   ? Time 6   ? Period Weeks   ? Status On-going   ? Target Date 09/06/21   ?  ? OT LONG TERM GOAL #2  ? Title Pt will increase her RUE shoulder strength to 4+/5 in order to increase her ability to lift and manage moderate weighted items.   ? Time 6   ? Period Weeks   ? Status On-going   ?  ? OT LONG TERM GOAL #3  ? Title Pt will report a decrease in pain level of approximately 3/10 or less when using her right arm to complete daily tasks.   ? Time 6   ? Period Weeks   ? Status On-going   ?  ? OT LONG TERM GOAL #4  ? Title Patient will decrease her RUE fascial restrictions to min amount or less in order to increase the  functional mobility needed to complete upper body dressing tasks and grooming her hair.   ? Time 6   ? Period Weeks   ? Status On-going   ? ?  ?  ? ?  ? ? ? Plan - 08/16/21 1444   ? ? Clinical Impression Statement A: Continued with manual techniques to address fascial restrictions in anterior shoulder and trapezius. Completed AA/ROM supine and standing, added proximal shoulder strengthening in supine and standing, added pvc pipe slide. Also completing  functional reaching task at cabinet. Min difficulty, fatigue with sustained use. Reviewed abduction for improved comprehension and carry over to HEP. Verbal cuing for form and technique.   ? Body Structure / Function / Physical Skills ADL;Strength;Pain;UE functional use;ROM;Fascial restriction   ? Plan P: Attempt A/ROM in supine, add scapular theraband   ? OT Home Exercise Plan eva: table slides; 2/27: scapular depression. 3/7: AA/ROM shoulder   ? Consulted and Agree with Plan of Care Patient   ? ?  ?  ? ?  ? ? ? ?Guadelupe Sabin, OTR/L  ?[phone] ?08/16/2021, 3:12 PM ? ?  ? ? ? ?

## 2021-08-24 ENCOUNTER — Encounter (HOSPITAL_COMMUNITY): Payer: Medicare Other

## 2021-08-26 ENCOUNTER — Other Ambulatory Visit: Payer: Self-pay

## 2021-08-26 ENCOUNTER — Encounter (HOSPITAL_COMMUNITY): Payer: Self-pay

## 2021-08-26 ENCOUNTER — Ambulatory Visit (HOSPITAL_COMMUNITY): Payer: Medicare Other

## 2021-08-26 DIAGNOSIS — M25511 Pain in right shoulder: Secondary | ICD-10-CM | POA: Diagnosis not present

## 2021-08-26 DIAGNOSIS — G8929 Other chronic pain: Secondary | ICD-10-CM | POA: Diagnosis not present

## 2021-08-26 DIAGNOSIS — R29898 Other symptoms and signs involving the musculoskeletal system: Secondary | ICD-10-CM

## 2021-08-26 DIAGNOSIS — M25611 Stiffness of right shoulder, not elsewhere classified: Secondary | ICD-10-CM

## 2021-08-26 NOTE — Therapy (Signed)
?OUTPATIENT OCCUPATIONAL THERAPY TREATMENT NOTE ? ? ?Patient Name: Rebecca Osborne ?MRN: [id number] ?DOB:1950-11-28, 71 y.o., female ?Today's Date: 08/26/2021 ? ?PCP: Susy Frizzle, MD ?REFERRING PROVIDER: Noemi Chapel, NP ? ? OT End of Session - 08/26/21 1432   ? ? Visit Number 7   ? Number of Visits 12   ? Date for OT Re-Evaluation 09/06/21   ? Authorization Type [employer] medicare   ? Progress Note Due on Visit 10   ? OT Start Time 1430   ? OT Stop Time 1508   ? OT Time Calculation (min) 38 min   ? Activity Tolerance Patient tolerated treatment well   ? Behavior During Therapy [facility] for tasks assessed/performed   ? ?  ?  ? ?  ? ? ? ?Past Medical History:  ?Diagnosis Date  ? Allergy   ? Cancer [facility])   ? cervical cancer cells  ? GERD (gastroesophageal reflux disease)   ? Osteopenia   ? ?Past Surgical History:  ?Procedure Laterality Date  ? ABDOMINAL HYSTERECTOMY    ? BIOPSY  10/16/2019  ? Procedure: BIOPSY;  Surgeon: Daneil Dolin, MD;  [facility];  Service: Endoscopy;;  ? BOWEL RESECTION N/A 06/22/2016  ? Procedure: SMALL BOWEL RESECTION;  Surgeon: Vickie Epley, MD;  Location: [facility] ORS;  Service: General;  Laterality: N/A;  ? CHOLECYSTECTOMY  2005  ? COLONOSCOPY  08/23/2019  ? Tortuous left colon, external and internal hemorrhoids  ? COLONOSCOPY N/A 08/23/2019  ? Procedure: COLONOSCOPY;  Surgeon: Danie Binder, MD;  Location: [facility];  Service: Endoscopy;  Laterality: N/A;  8:30  ? COLONOSCOPY WITH ESOPHAGOGASTRODUODENOSCOPY (EGD)  09/2009  ? Fields: Normal colonoscopy.  H. pylori gastritis.  Schatzki ring status post esophageal dilation.  Next colonoscopy 10 years  ? ESOPHAGOGASTRODUODENOSCOPY N/A 10/16/2019  ? Rourk: Erosive reflux esophagitis, gastric erosions, duodenal diverticulum.  Biopsies from the stomach showed mild inflammation.  No H. pylori.  ? FRACTURE SURGERY    ? rt foot  ? LAPAROTOMY N/A 06/22/2016  ? Procedure: EXPLORATORY LAPAROTOMY;  Surgeon: Vickie Epley, MD;  Location: [facility]  ORS;  Service: General;  Laterality: N/A;  ? LYSIS OF ADHESION N/A 06/22/2016  ? Procedure: LYSIS OF ADHESION;  Surgeon: Vickie Epley, MD;  Location: [facility] ORS;  Service: General;  Laterality: N/A;  ? MALONEY DILATION N/A 10/16/2019  ? Procedure: MALONEY DILATION;  Surgeon: Daneil Dolin, MD;  Location: [facility];  Service: Endoscopy;  Laterality: N/A;  ? ?Patient Active Problem List  ? Diagnosis Date Noted  ? Esophageal dysphagia 08/28/2019  ? Colon cancer screening   ? Hypomagnesemia 04/29/2019  ? C. difficile colitis 04/17/2019  ? Hypokalemia   ? Colitis   ? Diarrhea of presumed infectious origin   ? Pancolitis ([place]) 03/11/2019  ? Normocytic anemia 03/11/2019  ? Complex regional pain syndrome type 1 of right lower extremity 10/22/2018  ? Small bowel obstruction due to adhesions ([place]) 06/22/2016  ? GERD 08/31/2009  ? DYSPHAGIA UNSPECIFIED 08/31/2009  ? ? ?ONSET DATE: Approximately 04/2021 ? ?REFERRING DIAG: chronic right shoulder pain ? ?THERAPY DIAG:  ?Stiffness of right shoulder, not elsewhere classified ? ?Chronic right shoulder pain ? ?Other symptoms and signs involving the musculoskeletal system ? ? ?PERTINENT HISTORY: Patient is a 23 female S/P chronic right shoulder pain which started 3-4 months ago for no known reason. ? ?PRECAUTIONS: None ? ?SUBJECTIVE: S: It's feeling a little bit better.  ? ?PAIN:  ?Are you having pain?  No ? ? ? ? ?OBJECTIVE:  ? ?TODAY'S TREATMENT:  ?08/26/21: ?- P/ROM: shoulder flexion, horizontal ab/adduction, er/IR, abduction, Supine, 5X each ?- A/ROM: shoulder, protraction, flexion, horizontal ab/adduction, er/IR, abduction, Supine,10X ?- A/ROM: shoulder, protraction, flexion, horizontal ab/adduction, er/IR, abduction, Standing,10X ?-Proximal shoulder strengthening in supine, 10X each, no rest breaks  ?- Scapular strengthening: red band, row, extension, 10X ?- UBE bike level 1 2' forward 2' reverse pace: 5.0 ? ? ? ? ? ?08/16/21 ?-Myofascial release: completed separately from  therapeutic exercises. Myofascial release and manual stretching completed to right upper arm, upper trapezious, and scapularis region to decrease fascial restrictions and increase joint mobility in pain free zone ?-P/ROM-shoulder flexion, horizontal ab/adduction, er/IR, abduction, 5X each ?-AA/ROM-shoulder protraction, flexion, horizontal ab/adduction, er/IR, abduction, 10X each supine and standing ?-Proximal shoulder strengthening in supine and standing, 10X each, no rest breaks  ?-Wall wash, 1'  ?-PVC pipe slide, 10X flexion ?-Functional Reaching: Pt placing 10 cones on middle shelf of overhead cabinet in flexion, removing in abduction.  ?-UBE, level 1 3' forward 3' reverse, pace, 5.0 ? ? ?Home Exercise Program:  ?Eval: table slides; 2/27: scapular depression. 3/7: AA/ROM shoulder  ? ? OT Short Term Goals  ? ?  ? OT SHORT TERM GOAL #1  ? Title Pt will be educated and independent with HEP in order to return to using her RUE for at least 75% of daily tasks as her dominant extremity.   ? Time 3   ? Period Weeks   ? Status On-going   ? Target Date 08/16/21   ?  ? OT SHORT TERM GOAL #2  ? Title Pt will increase her RUE P/ROM to [facility] in order to increase the ability to complete dressing and bathing tasks with less difficulty.   ? Time 3   ? Period Weeks   ? Status On-going   ?  ? OT SHORT TERM GOAL #3  ? Title Pt will decrease her RUE fascial restrictions to moderate amount in order to increase the functional mobility needed to complete reaching tasks above shoulder level.   ? Time 3   ? Period Weeks   ? Status On-going   ? ?  ?  ? ?  ? ? ? OT Long Term Goals  ? ?  ? OT LONG TERM GOAL #1  ? Title Pt will increase her RUE A/ROM to [facility] in order to reach overhead and complete daily tasks that require high level reaching.   ? Time 6   ? Period Weeks   ? Status On-going   ? Target Date 09/06/21   ?  ? OT LONG TERM GOAL #2  ? Title Pt will increase her RUE shoulder strength to 4+/5 in order to increase her ability to lift and  manage moderate weighted items.   ? Time 6   ? Period Weeks   ? Status On-going   ?  ? OT LONG TERM GOAL #3  ? Title Pt will report a decrease in pain level of approximately 3/10 or less when using her right arm to complete daily tasks.   ? Time 6   ? Period Weeks   ? Status On-going   ?  ? OT LONG TERM GOAL #4  ? Title Patient will decrease her RUE fascial restrictions to min amount or less in order to increase the functional mobility needed to complete upper body dressing tasks and grooming her hair.   ? Time 6   ? Period Weeks   ?  Status On-going   ? ?  ?  ? ?  ? ? ? Plan - 08/26/21 1432   ? ? Clinical Impression Statement A: Pt able to demonstrate functional to full P/ROM this session. Progressed to A/ROM supine and standing with VC for form and technique. Completed scapular strengthening although unable to perform retraction with correct form and technique.  ? Body Structure / Function / Physical Skills ADL;Strength;Pain;UE functional use;ROM;Fascial restriction   ? Plan P: Continue with A/ROM and provide for HEP. Proximal shoulder strengthening on the door.  ? Consulted and Agree with Plan of Care Patient   ? ?  ?  ? ?  ? ? ? ?Ailene Ravel, OTR/L,CBIS  ?[phone] ? ?08/26/2021, 3:16 PM ? ?  ? ? ? ?

## 2021-08-30 ENCOUNTER — Encounter (HOSPITAL_COMMUNITY): Payer: Self-pay | Admitting: Occupational Therapy

## 2021-08-30 ENCOUNTER — Other Ambulatory Visit: Payer: Self-pay

## 2021-08-30 ENCOUNTER — Ambulatory Visit (HOSPITAL_COMMUNITY): Payer: Medicare Other | Admitting: Occupational Therapy

## 2021-08-30 DIAGNOSIS — G8929 Other chronic pain: Secondary | ICD-10-CM | POA: Diagnosis not present

## 2021-08-30 DIAGNOSIS — M25511 Pain in right shoulder: Secondary | ICD-10-CM | POA: Diagnosis not present

## 2021-08-30 DIAGNOSIS — R29898 Other symptoms and signs involving the musculoskeletal system: Secondary | ICD-10-CM | POA: Diagnosis not present

## 2021-08-30 DIAGNOSIS — M25611 Stiffness of right shoulder, not elsewhere classified: Secondary | ICD-10-CM

## 2021-08-30 NOTE — Therapy (Signed)
?OUTPATIENT OCCUPATIONAL THERAPY TREATMENT NOTE ? ? ?Patient Name: Rebecca Osborne ?MRN: [id number] ?DOB:01/04/1951, 71 y.o., female ?Today's Date: 08/30/2021 ? ?PCP: Susy Frizzle, MD ?REFERRING PROVIDER: Noemi Chapel, NP ? ? OT End of Session - 08/30/21 1449   ? ? Visit Number 8   ? Number of Visits 12   ? Date for OT Re-Evaluation 09/06/21   ? Authorization Type [employer] medicare   ? Progress Note Due on Visit 10   ? OT Start Time 1434   ? OT Stop Time 1512   ? OT Time Calculation (min) 38 min   ? Activity Tolerance Patient tolerated treatment well   ? Behavior During Therapy [facility] for tasks assessed/performed   ? ?  ?  ? ?  ? ? ? ? ?Past Medical History:  ?Diagnosis Date  ? Allergy   ? Cancer [facility])   ? cervical cancer cells  ? GERD (gastroesophageal reflux disease)   ? Osteopenia   ? ?Past Surgical History:  ?Procedure Laterality Date  ? ABDOMINAL HYSTERECTOMY    ? BIOPSY  10/16/2019  ? Procedure: BIOPSY;  Surgeon: Daneil Dolin, MD;  [facility];  Service: Endoscopy;;  ? BOWEL RESECTION N/A 06/22/2016  ? Procedure: SMALL BOWEL RESECTION;  Surgeon: Vickie Epley, MD;  Location: [facility] ORS;  Service: General;  Laterality: N/A;  ? CHOLECYSTECTOMY  2005  ? COLONOSCOPY  08/23/2019  ? Tortuous left colon, external and internal hemorrhoids  ? COLONOSCOPY N/A 08/23/2019  ? Procedure: COLONOSCOPY;  Surgeon: Danie Binder, MD;  Location: [facility] END[facility];  Service: Endoscopy;  Laterality: N/A;  8:30  ? COLONOSCOPY WITH ESOPHAGOGASTRODUODENOSCOPY (EGD)  09/2009  ? Fields: Normal colonoscopy.  H. pylori gastritis.  Schatzki ring status post esophageal dilation.  Next colonoscopy 10 years  ? ESOPHAGOGASTRODUODENOSCOPY N/A 10/16/2019  ? Rourk: Erosive reflux esophagitis, gastric erosions, duodenal diverticulum.  Biopsies from the stomach showed mild inflammation.  No H. pylori.  ? FRACTURE SURGERY    ? rt foot  ? LAPAROTOMY N/A 06/22/2016  ? Procedure: EXPLORATORY LAPAROTOMY;  Surgeon: Vickie Epley, MD;  Location: [facility]  ORS;  Service: General;  Laterality: N/A;  ? LYSIS OF ADHESION N/A 06/22/2016  ? Procedure: LYSIS OF ADHESION;  Surgeon: Vickie Epley, MD;  Location: [facility] ORS;  Service: General;  Laterality: N/A;  ? MALONEY DILATION N/A 10/16/2019  ? Procedure: MALONEY DILATION;  Surgeon: Daneil Dolin, MD;  Location: [facility] ENDO [facility];  Service: Endoscopy;  Laterality: N/A;  ? ?Patient Active Problem List  ? Diagnosis Date Noted  ? Esophageal dysphagia 08/28/2019  ? Colon cancer screening   ? Hypomagnesemia 04/29/2019  ? C. difficile colitis 04/17/2019  ? Hypokalemia   ? Colitis   ? Diarrhea of presumed infectious origin   ? Pancolitis ([place]) 03/11/2019  ? Normocytic anemia 03/11/2019  ? Complex regional pain syndrome type 1 of right lower extremity 10/22/2018  ? Small bowel obstruction due to adhesions ([place]) 06/22/2016  ? GERD 08/31/2009  ? DYSPHAGIA UNSPECIFIED 08/31/2009  ? ? ?ONSET DATE: Approximately 04/2021 ? ?REFERRING DIAG: chronic right shoulder pain ? ?THERAPY DIAG:  ?Stiffness of right shoulder, not elsewhere classified ? ?Chronic right shoulder pain ? ?Other symptoms and signs involving the musculoskeletal system ? ? ?PERTINENT HISTORY: Patient is a 61 female S/P chronic right shoulder pain which started 3-4 months ago for no known reason. ? ?PRECAUTIONS: None ? ?SUBJECTIVE: S:  ? ?PAIN:  ?Are you having pain? Yes: NPRS scale: 3/10 ?Pain  location: right shoulder ?Pain description: sore ?Aggravating factors: unsure ?Relieving factors: unsure ? ? ? ? ?OBJECTIVE:  ? ?TODAY'S TREATMENT:  ?-Myofascial release: completed separately from therapeutic exercises. Myofascial release and manual stretching completed to right upper arm, upper trapezious, and scapularis region to decrease fascial restrictions and increase joint mobility in pain free zone ?-P/ROM-shoulder flexion, horizontal ab/adduction, er/IR, abduction, 5X each ?-A/ROM-shoulder protraction, flexion, horizontal ab/adduction, er/IR, abduction, 10X each  standing ?-Proximal shoulder strengthening: 10X each, no rest breaks ?-Proximal shoulder strengthening on doorway, 1' flexion ?-Shoulder stretches: flexion, abduction, cross chest stretch, doorway stretch, IR behind back, 3x10" each ?-Ball pass behind back for IR, 10X tennis ball ?-UBE, level 1, 3' forward 3' reverse, pace: 4.5 ? ? ?08/26/21: ?- P/ROM: shoulder flexion, horizontal ab/adduction, er/IR, abduction, Supine, 5X each ?- A/ROM: shoulder, protraction, flexion, horizontal ab/adduction, er/IR, abduction, Supine,10X ?- A/ROM: shoulder, protraction, flexion, horizontal ab/adduction, er/IR, abduction, Standing,10X ?-Proximal shoulder strengthening in supine, 10X each, no rest breaks  ?- Scapular strengthening: red band, row, extension, 10X ?- UBE bike level 1 2' forward 2' reverse pace: 5.0 ? ? ? ?08/16/21 ?-Myofascial release: completed separately from therapeutic exercises. Myofascial release and manual stretching completed to right upper arm, upper trapezious, and scapularis region to decrease fascial restrictions and increase joint mobility in pain free zone ?-P/ROM-shoulder flexion, horizontal ab/adduction, er/IR, abduction, 5X each ?-AA/ROM-shoulder protraction, flexion, horizontal ab/adduction, er/IR, abduction, 10X each supine and standing ?-Proximal shoulder strengthening in supine and standing, 10X each, no rest breaks  ?-Wall wash, 1'  ?-PVC pipe slide, 10X flexion ?-Functional Reaching: Pt placing 10 cones on middle shelf of overhead cabinet in flexion, removing in abduction.  ?-UBE, level 1 3' forward 3' reverse, pace, 5.0 ? ? ?OT Education   ?  ?  Education Details A/ROM  ?  Person(s) Educated Patient   ?  Methods Explanation   ?  Comprehension Verbalized understanding   ?  ?   ? ? ?Home Exercise Program:  ?Eval: table slides; 2/27: scapular depression. 3/7: AA/ROM shoulder; 3/27: A/ROM ? ? OT Short Term Goals  ? ?  ? OT SHORT TERM GOAL #1  ? Title Pt will be educated and independent with HEP in  order to return to using her RUE for at least 75% of daily tasks as her dominant extremity.   ? Time 3   ? Period Weeks   ? Status On-going   ? Target Date 08/16/21   ?  ? OT SHORT TERM GOAL #2  ? Title Pt will increase her RUE P/ROM to [facility] in order to increase the ability to complete dressing and bathing tasks with less difficulty.   ? Time 3   ? Period Weeks   ? Status On-going   ?  ? OT SHORT TERM GOAL #3  ? Title Pt will decrease her RUE fascial restrictions to moderate amount in order to increase the functional mobility needed to complete reaching tasks above shoulder level.   ? Time 3   ? Period Weeks   ? Status On-going   ? ?  ?  ? ?  ? ? ? OT Long Term Goals  ? ?  ? OT LONG TERM GOAL #1  ? Title Pt will increase her RUE A/ROM to [facility] in order to reach overhead and complete daily tasks that require high level reaching.   ? Time 6   ? Period Weeks   ? Status On-going   ? Target Date 09/06/21   ?  ? OT  LONG TERM GOAL #2  ? Title Pt will increase her RUE shoulder strength to 4+/5 in order to increase her ability to lift and manage moderate weighted items.   ? Time 6   ? Period Weeks   ? Status On-going   ?  ? OT LONG TERM GOAL #3  ? Title Pt will report a decrease in pain level of approximately 3/10 or less when using her right arm to complete daily tasks.   ? Time 6   ? Period Weeks   ? Status On-going   ?  ? OT LONG TERM GOAL #4  ? Title Patient will decrease her RUE fascial restrictions to min amount or less in order to increase the functional mobility needed to complete upper body dressing tasks and grooming her hair.   ? Time 6   ? Period Weeks   ? Status On-going   ? ?  ?  ? ?  ? ? ? Plan - 08/26/21 1432   ? ? Clinical Impression Statement A: Continued with myofascial release and P/ROM, completed A/ROM in standing. Added proximal shoulder strengthening in standing and on doorway. Added shoulder stretches, ball pass, and UBE today. Verbal cuing for form and technique.   ? Body Structure / Function / Physical  Skills ADL;Strength;Pain;UE functional use;ROM;Fascial restriction   ? Plan P: Reassessment, continue shoulder stretches, follow up on HEP  ? Consulted and Agree with Plan of Care Patient   ? ?  ?  ? ?  ? ? ? ? ?Magda Paganini

## 2021-08-30 NOTE — Patient Instructions (Signed)

## 2021-08-31 ENCOUNTER — Ambulatory Visit (HOSPITAL_COMMUNITY): Payer: Medicare Other

## 2021-08-31 ENCOUNTER — Encounter (HOSPITAL_COMMUNITY): Payer: Self-pay

## 2021-08-31 DIAGNOSIS — M25611 Stiffness of right shoulder, not elsewhere classified: Secondary | ICD-10-CM | POA: Diagnosis not present

## 2021-08-31 DIAGNOSIS — G8929 Other chronic pain: Secondary | ICD-10-CM | POA: Diagnosis not present

## 2021-08-31 DIAGNOSIS — R29898 Other symptoms and signs involving the musculoskeletal system: Secondary | ICD-10-CM

## 2021-08-31 DIAGNOSIS — M25511 Pain in right shoulder: Secondary | ICD-10-CM | POA: Diagnosis not present

## 2021-08-31 NOTE — Therapy (Signed)
?OUTPATIENT OCCUPATIONAL THERAPY TREATMENT NOTE- reassess ? ? ?Patient Name: Rebecca Osborne ?MRN: [id number] ?DOB:Oct 23, 1950, 71 y.o., female ?Today's Date: 08/31/2021 ? ?PCP: Susy Frizzle, MD ?REFERRING PROVIDER: Noemi Chapel, NP ? ? OT End of Session - 08/31/21 1510   ? ? Visit Number 9   ? Number of Visits 12   ? Authorization Type [employer] medicare   ? OT Start Time 1440   reassess and discharge  ? OT Stop Time 1510   ? OT Time Calculation (min) 30 min   ? Activity Tolerance Patient tolerated treatment well   ? Behavior During Therapy [facility] for tasks assessed/performed   ? ?  ?  ? ?  ? ? ? ? ? ?Past Medical History:  ?Diagnosis Date  ? Allergy   ? Cancer [facility])   ? cervical cancer cells  ? GERD (gastroesophageal reflux disease)   ? Osteopenia   ? ?Past Surgical History:  ?Procedure Laterality Date  ? ABDOMINAL HYSTERECTOMY    ? BIOPSY  10/16/2019  ? Procedure: BIOPSY;  Surgeon: Daneil Dolin, MD;  [facility];  Service: Endoscopy;;  ? BOWEL RESECTION N/A 06/22/2016  ? Procedure: SMALL BOWEL RESECTION;  Surgeon: Vickie Epley, MD;  Location: [facility] ORS;  Service: General;  Laterality: N/A;  ? CHOLECYSTECTOMY  2005  ? COLONOSCOPY  08/23/2019  ? Tortuous left colon, external and internal hemorrhoids  ? COLONOSCOPY N/A 08/23/2019  ? Procedure: COLONOSCOPY;  Surgeon: Danie Binder, MD;  Location[facility];  Service: Endoscopy;  Laterality: N/A;  8:30  ? COLONOSCOPY WITH ESOPHAGOGASTRODUODENOSCOPY (EGD)  09/2009  ? Fields: Normal colonoscopy.  H. pylori gastritis.  Schatzki ring status post esophageal dilation.  Next colonoscopy 10 years  ? ESOPHAGOGASTRODUODENOSCOPY N/A 10/16/2019  ? Rourk: Erosive reflux esophagitis, gastric erosions, duodenal diverticulum.  Biopsies from the stomach showed mild inflammation.  No H. pylori.  ? FRACTURE SURGERY    ? rt foot  ? LAPAROTOMY N/A 06/22/2016  ? Procedure: EXPLORATORY LAPAROTOMY;  Surgeon: Vickie Epley, MD;  Location: [facility] ORS;  Service: General;  Laterality:  N/A;  ? LYSIS OF ADHESION N/A 06/22/2016  ? Procedure: LYSIS OF ADHESION;  Surgeon: Vickie Epley, MD;  Location: [facility] ORS;  Service: General;  Laterality: N/A;  ? MALONEY DILATION N/A 10/16/2019  ? Procedure: MALONEY DILATION;  Surgeon: Daneil Dolin, MD;  [facility];  Service: Endoscopy;  Laterality: N/A;  ? ?Patient Active Problem List  ? Diagnosis Date Noted  ? Esophageal dysphagia 08/28/2019  ? Colon cancer screening   ? Hypomagnesemia 04/29/2019  ? C. difficile colitis 04/17/2019  ? Hypokalemia   ? Colitis   ? Diarrhea of presumed infectious origin   ? Pancolitis ([place]) 03/11/2019  ? Normocytic anemia 03/11/2019  ? Complex regional pain syndrome type 1 of right lower extremity 10/22/2018  ? Small bowel obstruction due to adhesions ([place]) 06/22/2016  ? GERD 08/31/2009  ? DYSPHAGIA UNSPECIFIED 08/31/2009  ? ? ?ONSET DATE: Approximately 04/2021 ? ?REFERRING DIAG: chronic right shoulder pain ? ?THERAPY DIAG:  ?Stiffness of right shoulder, not elsewhere classified ? ?Other symptoms and signs involving the musculoskeletal system ? ?Chronic right shoulder pain ? ? ?PERTINENT HISTORY: Patient is a 41 female S/P chronic right shoulder pain which started 3-4 months ago for no known reason. ? ?PRECAUTIONS: None ? ?SUBJECTIVE: S: It's not hurting today. ? ?PAIN:  ?Are you having pain? No ?Pain location:  ?Pain description:  ?Aggravating factors:  ?Relieving factors:  ? ? ? ?  OBJECTIVE:  ?P/ROM - supine. Ir/er adducted ?Right Eval 08/31/21  ?Shoulder flexion 82 160   ?Shoulder abduction 105 180  ?Shoulder internal rotation 90 90  ?Shoulder external rotation 45 70  ? ?A/ROM - seated. IR/er adducted ?Right Eval 08/31/21  ?Shoulder flexion 80 140   ?Shoulder abduction 60 155  ?Shoulder internal rotation 90 90  ?Shoulder external rotation 55 82  ? ?MMT - seated. IR/er adducted ?Right Eval 08/31/21  ?Shoulder flexion 3-/5 5/5  ?Shoulder abduction 3-/5 5/5  ?Shoulder internal rotation 3/5 5/5  ?Shoulder external rotation  3-/5 4+/5  ? ?FOTO score: 78/100 ? ?TODAY'S TREATMENT:  ? ? ? ? ? ?Home Exercise Program:  ?Eval: table slides; 2/27: scapular depression. 3/7: AA/ROM shoulder; 3/27: A/ROM ? ? OT Short Term Goals  ? ?  ? OT SHORT TERM GOAL #1  ? Title Pt will be educated and independent with HEP in order to return to using her RUE for at least 75% of daily tasks as her dominant extremity.   ? Time 3   ? Period Weeks   ? Status MET  ? Target Date 08/16/21   ?  ? OT SHORT TERM GOAL #2  ? Title Pt will increase her RUE P/ROM to [facility] in order to increase the ability to complete dressing and bathing tasks with less difficulty.   ? Time 3   ? Period Weeks   ? Status MET  ?  ? OT SHORT TERM GOAL #3  ? Title Pt will decrease her RUE fascial restrictions to moderate amount in order to increase the functional mobility needed to complete reaching tasks above shoulder level.   ? Time 3   ? Period Weeks   ? Status MET  ? ?  ?  ? ?  ? ? ? OT Long Term Goals  ? ?  ? OT LONG TERM GOAL #1  ? Title Pt will increase her RUE A/ROM to [facility] in order to reach overhead and complete daily tasks that require high level reaching.   ? Time 6   ? Period Weeks   ? Status MET  ? Target Date 09/06/21   ?  ? OT LONG TERM GOAL #2  ? Title Pt will increase her RUE shoulder strength to 4+/5 in order to increase her ability to lift and manage moderate weighted items.   ? Time 6   ? Period Weeks   ? Status MET  ?  ? OT LONG TERM GOAL #3  ? Title Pt will report a decrease in pain level of approximately 3/10 or less when using her right arm to complete daily tasks.   ? Time 6   ? Period Weeks   ? Status MET   ?  ? OT LONG TERM GOAL #4  ? Title Patient will decrease her RUE fascial restrictions to min amount or less in order to increase the functional mobility needed to complete upper body dressing tasks and grooming her hair.   ? Time 6   ? Period Weeks   ? Status MET  ? ?  ?  ? ?  ? ? ? Plan - 08/26/21 1432   ? ? Clinical Impression Statement A: Reassessment completed  this date. Patient is able to demonstrate functional A/ROM of the shoulder, Strength is 5/5 overall. All therapy goals have been met. Reviewed HEP and recommended continuing to complete independently while gradually progressing her strength  as tolerated. All education has been completed this date.   ?  Body Structure / Function / Physical Skills ADL;Strength;Pain;UE functional use;ROM;Fascial restriction   ? Plan P: Discharge from OT services with HEP  ? Consulted and Agree with Plan of Care Patient   ? ?  ?  ? ?  ? ? ? ? ?Ailene Ravel, OTR/L,CBIS  ?[phone] ? ?08/31/2021, 3:10 PM ? ?  ? ? ? ?

## 2021-09-07 ENCOUNTER — Telehealth: Payer: Self-pay

## 2021-09-07 ENCOUNTER — Encounter (HOSPITAL_COMMUNITY): Payer: Medicare Other

## 2021-09-07 NOTE — Telephone Encounter (Signed)
Would like to know if you can write an Rx to help her hair grow, she is loosing a lot of hair.  ? ?Pls advise ?

## 2021-09-09 ENCOUNTER — Encounter (HOSPITAL_COMMUNITY): Payer: Medicare Other

## 2021-09-09 NOTE — Telephone Encounter (Signed)
Called left Message  ?

## 2021-09-14 ENCOUNTER — Encounter (HOSPITAL_COMMUNITY): Payer: Medicare Other

## 2021-09-15 NOTE — Telephone Encounter (Signed)
Called pt and is aware of suggestion per Dr. Dennard Schaumann.  ? ?Pt voiced understanding ?

## 2021-09-16 ENCOUNTER — Telehealth: Payer: Self-pay | Admitting: Family Medicine

## 2021-09-16 ENCOUNTER — Encounter (HOSPITAL_COMMUNITY): Payer: Medicare Other

## 2021-09-16 ENCOUNTER — Ambulatory Visit (INDEPENDENT_AMBULATORY_CARE_PROVIDER_SITE_OTHER): Payer: Medicare Other | Admitting: Family Medicine

## 2021-09-16 VITALS — BP 148/98 | HR 72 | Temp 96.5°F | Ht 61.0 in | Wt 141.8 lb

## 2021-09-16 DIAGNOSIS — I872 Venous insufficiency (chronic) (peripheral): Secondary | ICD-10-CM

## 2021-09-16 NOTE — Progress Notes (Signed)
? ?Subjective:  ? ? Patient ID: Rebecca Osborne, female    DOB: [birth year], 71 y.o.   MRN: [id number] ? ?Patient presents today complaining of pain and swelling in both legs left greater than right.  She has numerous varicose veins both legs.  She is tender to palpation over the varicose veins particularly over the medial thigh just above her knee.  She reports swelling in that area although there is no palpable edema.  There is no edema or swelling or effusion popliteal fossa.  There is no pitting edema in the left lower extremity although the patient feels like it feels swollen.  There are scattered varicose veins all throughout the left leg.  She has normal 2/4 dorsalis pedis and posterior tibialis pulses bilaterally.  She has normal muscle strength.  There is no erythema or warmth.  She has negative Homans' sign.  She has a difficult time describing the pain.  It appears to be constant.  At times she says it feels like an aching heaviness.  At other times she describes it as a numbness and a stabbing and burning pain.  Therefore it could either be due to chronic venous insufficiency or peripheral neuropathy. ?Past Medical History:  ?Diagnosis Date  ? Allergy   ? Cancer [facility])   ? cervical cancer cells  ? GERD (gastroesophageal reflux disease)   ? Osteopenia   ? ?Past Surgical History:  ?Procedure Laterality Date  ? ABDOMINAL HYSTERECTOMY    ? BIOPSY  10/16/2019  ? Procedure: BIOPSY;  Surgeon: Daneil Dolin, MD;  [facility];  Service: Endoscopy;;  ? BOWEL RESECTION N/A 06/22/2016  ? Procedure: SMALL BOWEL RESECTION;  Surgeon: Vickie Epley, MD;  Location: [facility] ORS;  Service: General;  Laterality: N/A;  ? CHOLECYSTECTOMY  2005  ? COLONOSCOPY  08/23/2019  ? Tortuous left colon, external and internal hemorrhoids  ? COLONOSCOPY N/A 08/23/2019  ? Procedure: COLONOSCOPY;  Surgeon: Danie Binder, MD;  Location: [facility];  Service: Endoscopy;  Laterality: N/A;  8:30  ? COLONOSCOPY WITH  ESOPHAGOGASTRODUODENOSCOPY (EGD)  09/2009  ? Fields: Normal colonoscopy.  H. pylori gastritis.  Schatzki ring status post esophageal dilation.  Next colonoscopy 10 years  ? ESOPHAGOGASTRODUODENOSCOPY N/A 10/16/2019  ? Rourk: Erosive reflux esophagitis, gastric erosions, duodenal diverticulum.  Biopsies from the stomach showed mild inflammation.  No H. pylori.  ? FRACTURE SURGERY    ? rt foot  ? LAPAROTOMY N/A 06/22/2016  ? Procedure: EXPLORATORY LAPAROTOMY;  Surgeon: Vickie Epley, MD;  Location: [facility] ORS;  Service: General;  Laterality: N/A;  ? LYSIS OF ADHESION N/A 06/22/2016  ? Procedure: LYSIS OF ADHESION;  Surgeon: Vickie Epley, MD;  Location: [facility] ORS;  Service: General;  Laterality: N/A;  ? MALONEY DILATION N/A 10/16/2019  ? Procedure: MALONEY DILATION;  Surgeon: Daneil Dolin, MD;  Location: [facility];  Service: Endoscopy;  Laterality: N/A;  ? ?Current Outpatient Medications on File Prior to Visit  ?Medication Sig Dispense Refill  ? Calcium Carb-Cholecalciferol (CALCIUM 600+D3 PO) Take 1 tablet by mouth daily.     ? Cholecalciferol (VITAMIN D) 50 MCG (2000 UT) tablet Take 2,000 Units by mouth daily.    ? famotidine (PEPCID) 20 MG tablet TAKE 1 TABLET BY MOUTH EVERY DAY 90 tablet 3  ? HM SUPER VITAMIN B12 2500 MCG CHEW Chew 2,500 mcg by mouth daily.    ? ibuprofen (ADVIL) 200 MG tablet Take 200-400 mg by mouth every 6 (six) hours as  needed (for pain.).    ? ?No current facility-administered medications on file prior to visit.  ? ?Allergies  ?Allergen Reactions  ? Codeine Itching  ? ?Social History  ? ?Socioeconomic History  ? Marital status: Widowed  ?  Spouse name: Not on file  ? Number of children: 3  ? Years of education: Not on file  ? Highest education level: Not on file  ?Occupational History  ? Not on file  ?Tobacco Use  ? Smoking status: Never  ? Smokeless tobacco: Never  ?Vaping Use  ? Vaping Use: Never used  ?Substance and Sexual Activity  ? Alcohol use: No  ? Drug use: No  ? Sexual activity:  Not Currently  ?Other Topics Concern  ? Not on file  ?Social History Narrative  ? Husband died in Aug 05, 2010.  ? ?Social Determinants of Health  ? ?Financial Resource Strain: Low Risk   ? Difficulty of Paying Living Expenses: Not hard at all  ?Food Insecurity: No Food Insecurity  ? Worried About Charity fundraiser in the Last Year: Never true  ? Ran Out of Food in the Last Year: Never true  ?Transportation Needs: No Transportation Needs  ? Lack of Transportation (Medical): No  ? Lack of Transportation (Non-Medical): No  ?Physical Activity: Sufficiently Active  ? Days of Exercise per Week: 5 days  ? Minutes of Exercise per Session: 30 min  ?Stress: No Stress Concern Present  ? Feeling of Stress : Not at all  ?Social Connections: Moderately Integrated  ? Frequency of Communication with Friends and Family: More than three times a week  ? Frequency of Social Gatherings with Friends and Family: More than three times a week  ? Attends Religious Services: More than 4 times per year  ? Active Member of Clubs or Organizations: Yes  ? Attends Archivist Meetings: More than 4 times per year  ? Marital Status: Widowed  ?Intimate Partner Violence: Not At Risk  ? Fear of Current or Ex-Partner: No  ? Emotionally Abused: No  ? Physically Abused: No  ? Sexually Abused: No  ? ? ? ? ?Review of Systems  ?Respiratory:  Positive for cough.   ?All other systems reviewed and are negative. ? ?   ?Objective:  ? Physical Exam ?Vitals reviewed.  ?Constitutional:   ?   General: She is not in acute distress. ?   Appearance: Normal appearance. She is not ill-appearing or toxic-appearing.  ?Cardiovascular:  ?   Rate and Rhythm: Normal rate and regular rhythm.  ?   Pulses: Normal pulses.  ?   Heart sounds: Normal heart sounds. No murmur heard. ?  No friction rub.  ?Pulmonary:  ?   Effort: Pulmonary effort is normal. No respiratory distress.  ?   Breath sounds: No stridor. No wheezing, rhonchi or rales.  ?Abdominal:  ?   General: Abdomen is  flat. Bowel sounds are normal.  ?   Palpations: Abdomen is soft.  ?   Tenderness: There is no abdominal tenderness. There is no rebound.  ?Musculoskeletal:     ?   General: Deformity present. No swelling.  ?   Right lower leg: No edema.  ?   Left lower leg: Tenderness present. No edema.  ?     Legs: ? ?Lymphadenopathy:  ?   Cervical: No cervical adenopathy.  ?Skin: ?   Findings: No bruising, erythema, lesion or rash.  ?Neurological:  ?   Mental Status: She is alert.  ? ? ? ? ? ?   ?  Assessment & Plan:  ?Chronic venous insufficiency ?I believe the majority of the pain in her leg is likely due to chronic venous insufficiency.  We will treat this with compression hose, thigh-high.  I recommended that the patient pick up a pair of these at her local pharmacy and start wearing them on a daily basis.  She can also try using horse Chestnut seed extract daily to help with the heaviness and aching pain in her legs.  If this is not helping I would try the patient empirically on gabapentin for possible peripheral neuropathy given the vague nature of the pain and also consult vein specialist ?

## 2021-09-16 NOTE — Telephone Encounter (Signed)
Patient disputing amount of outstanding bill from 2019; states she had insurance at that time. ? ?Patient was covered under Medicare Part A&B, Id # [id number]. ? ?Requesting for bills to be resubmitted to insurance for payment.  ? ?Please advise at [phone]. ?

## 2021-09-30 NOTE — Telephone Encounter (Signed)
After reviewing patients account it shows that she owes $71.94 for DOS 08/17/17., and 08/28/17 $29.25 they are both coming from a deductible. Patient verbalized understanding and will make a payment in the amount of $50 on 5/3 and $51 on 11/06/21 ?

## 2021-10-12 ENCOUNTER — Telehealth: Payer: Self-pay

## 2021-10-12 NOTE — Telephone Encounter (Signed)
Pt called to see if she can get a prescription for the ringing in her ears, per pt sounds like bees. ? ?Pls advice? ?

## 2021-10-13 NOTE — Telephone Encounter (Signed)
Return call to pt and made appointment for Friday,10/15/21 at 12 ?

## 2021-10-15 ENCOUNTER — Ambulatory Visit (INDEPENDENT_AMBULATORY_CARE_PROVIDER_SITE_OTHER): Payer: Medicare Other | Admitting: Family Medicine

## 2021-10-15 VITALS — BP 126/84 | HR 73 | Temp 97.0°F | Ht 61.0 in | Wt 140.0 lb

## 2021-10-15 DIAGNOSIS — H9313 Tinnitus, bilateral: Secondary | ICD-10-CM | POA: Diagnosis not present

## 2021-10-15 NOTE — Progress Notes (Signed)
? ?Subjective:  ? ? Patient ID: Rebecca Osborne, female    DOB: January 17, 1951, 71 y.o.   MRN: [id number] ? ?Patient is concerned because she has tinnitus.  She states that she has had tinnitus for years.  She hears a high-pitched ringing sound in her ears even during conversation.  It has not worsened.  She denies any vertigo.  She does have some mild hearing loss.  She denies any otalgia or sinus pain or headaches or blurry vision or double vision or recent trauma to the head of the years.  She has read and researched that tinnitus is associated with Alzheimer's disease and she is concerned that this means she is going to get Alzheimer's disease. ?Past Medical History:  ?Diagnosis Date  ?? Allergy   ?? Cancer [facility])   ? cervical cancer cells  ?? GERD (gastroesophageal reflux disease)   ?? Osteopenia   ? ?Past Surgical History:  ?Procedure Laterality Date  ?? ABDOMINAL HYSTERECTOMY    ?? BIOPSY  10/16/2019  ? Procedure: BIOPSY;  Surgeon: Daneil Dolin, MD;  Location: [facility];  Service: Endoscopy;;  ?? BOWEL RESECTION N/A 06/22/2016  ? Procedure: SMALL BOWEL RESECTION;  Surgeon: Vickie Epley, MD;  Location: [facility] ORS;  Service: General;  Laterality: N/A;  ?? CHOLECYSTECTOMY  2005  ?? COLONOSCOPY  08/23/2019  ? Tortuous left colon, external and internal hemorrhoids  ?? COLONOSCOPY N/A 08/23/2019  ? Procedure: COLONOSCOPY;  Surgeon: Danie Binder, MD;  Location: [facility];  Service: Endoscopy;  Laterality: N/A;  8:30  ?? COLONOSCOPY WITH ESOPHAGOGASTRODUODENOSCOPY (EGD)  09/2009  ? Fields: Normal colonoscopy.  H. pylori gastritis.  Schatzki ring status post esophageal dilation.  Next colonoscopy 10 years  ?? ESOPHAGOGASTRODUODENOSCOPY N/A 10/16/2019  ? Rourk: Erosive reflux esophagitis, gastric erosions, duodenal diverticulum.  Biopsies from the stomach showed mild inflammation.  No H. pylori.  ?? FRACTURE SURGERY    ? rt foot  ?? LAPAROTOMY N/A 06/22/2016  ? Procedure: EXPLORATORY LAPAROTOMY;  Surgeon: Vickie Epley, MD;  Location: [facility];  Service: General;  Laterality: N/A;  ?? LYSIS OF ADHESION N/A 06/22/2016  ? Procedure: LYSIS OF ADHESION;  Surgeon: Vickie Epley, MD;  Location: [facility];  Service: General;  Laterality: N/A;  ?? MALONEY DILATION N/A 10/16/2019  ? Procedure: MALONEY DILATION;  Surgeon: Daneil Dolin, MD;  Location: [facility];  Service: Endoscopy;  Laterality: N/A;  ? ?Current Outpatient Medications on File Prior to Visit  ?Medication Sig Dispense Refill  ?? Calcium Carb-Cholecalciferol (CALCIUM 600+D3 PO) Take 1 tablet by mouth daily.     ?? Cholecalciferol (VITAMIN D) 50 MCG (2000 UT) tablet Take 2,000 Units by mouth daily.    ?? famotidine (PEPCID) 20 MG tablet TAKE 1 TABLET BY MOUTH EVERY DAY 90 tablet 3  ?? HM SUPER VITAMIN B12 2500 MCG CHEW Chew 2,500 mcg by mouth daily.    ?? ibuprofen (ADVIL) 200 MG tablet Take 200-400 mg by mouth every 6 (six) hours as needed (for pain.).    ? ?No current facility-administered medications on file prior to visit.  ? ?Allergies  ?Allergen Reactions  ?? Codeine Itching  ? ?Social History  ? ?Socioeconomic History  ?? Marital status: Widowed  ?  Spouse name: Not on file  ?? Number of children: 3  ?? Years of education: Not on file  ?? Highest education level: Not on file  ?Occupational History  ?? Not on file  ?Tobacco Use  ?? Smoking  status: Never  ?? Smokeless tobacco: Never  ?Vaping Use  ?? Vaping Use: Never used  ?Substance and Sexual Activity  ?? Alcohol use: No  ?? Drug use: No  ?? Sexual activity: Not Currently  ?Other Topics Concern  ?? Not on file  ?Social History Narrative  ? Husband died in 08/22/10.  ? ?Social Determinants of Health  ? ?Financial Resource Strain: Low Risk   ?? Difficulty of Paying Living Expenses: Not hard at all  ?Food Insecurity: No Food Insecurity  ?? Worried About Charity fundraiser in the Last Year: Never true  ?? Ran Out of Food in the Last Year: Never true  ?Transportation Needs: No Transportation Needs  ?? Lack of  Transportation (Medical): No  ?? Lack of Transportation (Non-Medical): No  ?Physical Activity: Sufficiently Active  ?? Days of Exercise per Week: 5 days  ?? Minutes of Exercise per Session: 30 min  ?Stress: No Stress Concern Present  ?? Feeling of Stress : Not at all  ?Social Connections: Moderately Integrated  ?? Frequency of Communication with Friends and Family: More than three times a week  ?? Frequency of Social Gatherings with Friends and Family: More than three times a week  ?? Attends Religious Services: More than 4 times per year  ?? Active Member of Clubs or Organizations: Yes  ?? Attends Archivist Meetings: More than 4 times per year  ?? Marital Status: Widowed  ?Intimate Partner Violence: Not At Risk  ?? Fear of Current or Ex-Partner: No  ?? Emotionally Abused: No  ?? Physically Abused: No  ?? Sexually Abused: No  ? ? ? ? ?Review of Systems  ?Respiratory:  Positive for cough.   ?All other systems reviewed and are negative. ? ?   ?Objective:  ? Physical Exam ?Vitals reviewed.  ?Constitutional:   ?   General: She is not in acute distress. ?   Appearance: Normal appearance. She is not ill-appearing or toxic-appearing.  ?HENT:  ?   Right Ear: Tympanic membrane, ear canal and external ear normal. There is no impacted cerumen.  ?   Left Ear: Tympanic membrane, ear canal and external ear normal. There is no impacted cerumen.  ?   Nose: Nose normal. No congestion or rhinorrhea.  ?Cardiovascular:  ?   Rate and Rhythm: Normal rate and regular rhythm.  ?   Pulses: Normal pulses.  ?   Heart sounds: Normal heart sounds. No murmur heard. ?  No friction rub.  ?Pulmonary:  ?   Effort: Pulmonary effort is normal. No respiratory distress.  ?   Breath sounds: No stridor. No wheezing, rhonchi or rales.  ?Musculoskeletal:     ?   General: No swelling.  ?Lymphadenopathy:  ?   Cervical: No cervical adenopathy.  ?Skin: ?   Findings: No bruising, erythema, lesion or rash.  ?Neurological:  ?   General: No focal deficit  present.  ?   Mental Status: She is alert and oriented to person, place, and time. Mental status is at baseline.  ?   Cranial Nerves: No cranial nerve deficit.  ? ? ? ? ? ?   ?Assessment & Plan:  ?Tinnitus aurium, bilateral ?I explained to the patient that tinnitus is related to damage to the nerve in the interval year.  There is no association that I am aware of with tinnitus causing Alzheimer's disease.  I reassured the patient that her tinnitus does not mean that she is going to Alzheimer's disease.  Unfortunately, I explained  to the patient that we do not have treatment that worked in the other than hearing aids and her hearing loss is not sufficient to require hearing aids at the present time. ?

## 2021-10-31 ENCOUNTER — Encounter (HOSPITAL_COMMUNITY): Payer: Self-pay | Admitting: *Deleted

## 2021-10-31 ENCOUNTER — Other Ambulatory Visit: Payer: Self-pay

## 2021-10-31 ENCOUNTER — Emergency Department (HOSPITAL_COMMUNITY)
Admission: EM | Admit: 2021-10-31 | Discharge: 2021-10-31 | Disposition: A | Discharge status: Home / Self Care | Payer: Medicare Other | Attending: Emergency Medicine | Admitting: Emergency Medicine

## 2021-10-31 DIAGNOSIS — X58XXXA Exposure to other specified factors, initial encounter: Secondary | ICD-10-CM | POA: Insufficient documentation

## 2021-10-31 DIAGNOSIS — S0501XA Injury of conjunctiva and corneal abrasion without foreign body, right eye, initial encounter: Secondary | ICD-10-CM | POA: Insufficient documentation

## 2021-10-31 MED ORDER — FLUORESCEIN SODIUM 1 MG OP STRP
1.0000 | ORAL_STRIP | Freq: Once | OPHTHALMIC | Status: AC
  Administered 2021-10-31: 1 via OPHTHALMIC
  Filled 2021-10-31: qty 1

## 2021-10-31 MED ORDER — ERYTHROMYCIN 5 MG/GM OP OINT: TOPICAL_OINTMENT | OPHTHALMIC | 0 refills | Status: DC

## 2021-10-31 MED ORDER — ERYTHROMYCIN 5 MG/GM OP OINT
TOPICAL_OINTMENT | Freq: Once | OPHTHALMIC | Status: AC
  Administered 2021-10-31: 1 via OPHTHALMIC
  Filled 2021-10-31: qty 3.5

## 2021-10-31 MED ORDER — TETRACAINE HCL 0.5 % OP SOLN
2.0000 [drp] | Freq: Once | OPHTHALMIC | Status: AC
  Administered 2021-10-31: 2 [drp] via OPHTHALMIC
  Filled 2021-10-31: qty 4

## 2021-10-31 NOTE — ED Triage Notes (Signed)
c/o eye pain, onset while watching TV Friday night, c/o f/b "Piece of eyeliner", "feels like sand in eye", gradually progressively worse. Eye with redness and guarding. No drainage, URI sx, vomiting, or fever.

## 2021-10-31 NOTE — ED Provider Notes (Signed)
[facility] EMERGENCY DEPARTMENT Provider Note   CSN: [id number] Arrival date & time: 10/31/21  1525     History  Chief Complaint  Patient presents with   Foreign Body in Eye    "Piece of eyeliner"    Rebecca Osborne is a 71 y.o. female.   Foreign Body in Eye   Patient is a 71 year old female presenting today with right eye pain.  Started 2 days ago when she was putting on eyeliner, she sharpened eyeliner and feels like it "broke off in her eye."  The pain is constant, worse with light.  She feels when she moves her eye as well.  There is no drainage, she has tried irrigating it without any success.  She feels like there is something sand-like inside her eyelid, she does not wear contacts or glasses.  Denies any vision changes or vision loss.  No URI symptoms, no headaches.  Home Medications Prior to Admission medications   Medication Sig Start Date End Date Taking? Authorizing Provider  erythromycin ophthalmic ointment Place a 1/2 inch ribbon of ointment into the lower eyelid. 10/31/21  Yes Sherrill Raring, PA-C  Calcium Carb-Cholecalciferol (CALCIUM 600+D3 PO) Take 1 tablet by mouth daily.     [provider]  Cholecalciferol (VITAMIN D) 50 MCG (2000 UT) tablet Take 2,000 Units by mouth daily.    [provider]  famotidine (PEPCID) 20 MG tablet TAKE 1 TABLET BY MOUTH EVERY DAY 04/13/21   Annitta Needs, NP  HM SUPER VITAMIN B12 2500 MCG CHEW Chew 2,500 mcg by mouth daily.    [provider]  ibuprofen (ADVIL) 200 MG tablet Take 200-400 mg by mouth every 6 (six) hours as needed (for pain.).    [provider]      Allergies    Codeine    Review of Systems   Review of Systems  Physical Exam Updated Vital Signs BP (!) 155/73 (BP Location: Left Arm)   Pulse 70   Temp 97.8 F (36.6 C) (Oral)   Resp 17   Ht 5' 1"  (1.549 m)   Wt 63.3 kg   SpO2 98%   BMI 26.36 kg/m  Physical Exam Vitals and nursing note reviewed. Exam conducted with a  chaperone present.  Constitutional:      General: She is not in acute distress.    Appearance: Normal appearance.  HENT:     Head: Normocephalic and atraumatic.  Eyes:     General: No scleral icterus.       Right eye: Discharge present.     Extraocular Movements: Extraocular movements intact.     Pupils: Pupils are equal, round, and reactive to light.     Comments: Right eye-injected, slight abrasion noted under fluorescein but no ulcerations or foreign bodies appreciated.  Eyelid flipped, some yellow discharge but no foreign bodies or lacerations or abrasions to the upper eyelid.  Lower eyelid visualized, unremarkable.  EOMI, pupils equal and reactive.  No periorbital swelling.  Skin:    Coloration: Skin is not jaundiced.  Neurological:     Mental Status: She is alert. Mental status is at baseline.     Coordination: Coordination normal.    ED Results / Procedures / Treatments   Labs (all labs ordered are listed, but only abnormal results are displayed) Labs Reviewed - No data to display  EKG None  Radiology No results found.  Procedures Procedures    Medications Ordered in ED Medications  erythromycin ophthalmic ointment (has no administration  in time range)  fluorescein ophthalmic strip 1 strip (1 strip Both Eyes Given 10/31/21 1607)  tetracaine (PONTOCAINE) 0.5 % ophthalmic solution 2 drop (2 drops Both Eyes Given 10/31/21 1607)    ED Course/ Medical Decision Making/ A&P                           Medical Decision Making Risk Prescription drug management.   71 year old presents with right eye foreign body sensation.  Differential includes but not limited to foreign body in eye, corneal abrasion, corneal ulceration, bacterial conjunctivitis.  On exam patient has a small corneal abrasion, there is also yellow exudate underneath her eyelid but no retained piece of eyeliner noted.  Will discharge on erythromycin ointment.  Patient does not wear glasses or contacts, does  not currently have an eye doctor so referral provided.  On visual acuity her right eye is somewhat decreased at 20/40 but no baseline is available since she does not routinely see an eye doctor.  She denies any changes of vision in her right eye.  EOMI, no systemic symptoms or surrounding infectious symptoms.  We discussed very strict return precautions and follow-up plan.  She was discharged in stable condition.        Final Clinical Impression(s) / ED Diagnoses Final diagnoses:  Abrasion of right cornea, initial encounter    Rx / DC Orders ED Discharge Orders          Ordered    erythromycin ophthalmic ointment        10/31/21 1638              Sherrill Raring, PA-C 10/31/21 1640    Milton Ferguson, MD 11/02/21 [phone]

## 2021-10-31 NOTE — Discharge Instructions (Addendum)
Use the erythromycin ointment 3 times daily for the next 72 hours.  He should notice improvement in the next 48 hours, if you are still having symptoms present for recheck at the eye doctors office or urgent care if you cannot be seen.  Return to the ED if you have vision loss, vision changes or spreading signs of infection surrounding the eye.

## 2021-10-31 NOTE — ED Notes (Signed)
Visual acuity   L 20/20    R 20/40

## 2021-12-06 ENCOUNTER — Encounter (HOSPITAL_COMMUNITY): Payer: Self-pay | Admitting: *Deleted

## 2021-12-06 ENCOUNTER — Other Ambulatory Visit: Payer: Self-pay

## 2021-12-06 ENCOUNTER — Emergency Department (HOSPITAL_COMMUNITY)
Admission: EM | Admit: 2021-12-06 | Discharge: 2021-12-06 | Disposition: A | Discharge status: Home / Self Care | Payer: Medicare Other | Attending: Emergency Medicine | Admitting: Emergency Medicine

## 2021-12-06 DIAGNOSIS — H1031 Unspecified acute conjunctivitis, right eye: Secondary | ICD-10-CM | POA: Insufficient documentation

## 2021-12-06 DIAGNOSIS — H5711 Ocular pain, right eye: Secondary | ICD-10-CM | POA: Diagnosis present

## 2021-12-06 DIAGNOSIS — H109 Unspecified conjunctivitis: Secondary | ICD-10-CM

## 2021-12-06 MED ORDER — ERYTHROMYCIN 5 MG/GM OP OINT: TOPICAL_OINTMENT | OPHTHALMIC | 0 refills | Status: DC

## 2021-12-06 MED ORDER — TETRACAINE HCL 0.5 % OP SOLN
1.0000 [drp] | Freq: Once | OPHTHALMIC | Status: AC
  Administered 2021-12-06: 2 [drp] via OPHTHALMIC
  Filled 2021-12-06: qty 4

## 2021-12-06 MED ORDER — FLUORESCEIN SODIUM 1 MG OP STRP
1.0000 | ORAL_STRIP | Freq: Once | OPHTHALMIC | Status: AC
  Administered 2021-12-06: 1 via OPHTHALMIC
  Filled 2021-12-06: qty 1

## 2021-12-06 NOTE — ED Provider Notes (Signed)
[facility] Provider Note   CSN: [id number] Arrival date & time: 12/06/21  1741     History  Chief Complaint  Patient presents with   Eye Problem    Rebecca Osborne is a 71 y.o. female.  Patient with history of GERD and SBO presents today with complaints of right eye pain. She states that same began initially 1 month ago when she was seen for same when she felt as though eyeliner broke off in her eye.  She was seen for same and diagnosed with a corneal abrasion and given erythromycin ointment. She was also given an ophthalmology referral for follow-up, however states that she did not schedule a follow-up appointment.  She states that her symptoms got better until she ran out of the erythromycin and they subsequently got worse approximately 2 days ago. She denies any changes in her vision. States that she continues to have a foreign body sensation in her eye. Denies any purulent drainage. No URI symptoms, no headaches.  She does not wear contact lenses or glasses.  The history is provided by the patient. No language interpreter was used.  Eye Problem      Home Medications Prior to Admission medications   Medication Sig Start Date End Date Taking? Authorizing Provider  Calcium Carb-Cholecalciferol (CALCIUM 600+D3 PO) Take 1 tablet by mouth daily.     [provider]  Cholecalciferol (VITAMIN D) 50 MCG (2000 UT) tablet Take 2,000 Units by mouth daily.    [provider]  erythromycin ophthalmic ointment Place a 1/2 inch ribbon of ointment into the lower eyelid. 10/31/21   Sherrill Raring, PA-C  famotidine (PEPCID) 20 MG tablet TAKE 1 TABLET BY MOUTH EVERY DAY 04/13/21   Annitta Needs, NP  HM SUPER VITAMIN B12 2500 MCG CHEW Chew 2,500 mcg by mouth daily.    [provider]  ibuprofen (ADVIL) 200 MG tablet Take 200-400 mg by mouth every 6 (six) hours as needed (for pain.).    [provider]      Allergies    Codeine    Review of  Systems   Review of Systems  Eyes:  Positive for pain.  All other systems reviewed and are negative.   Physical Exam Updated Vital Signs BP 125/82 (BP Location: Left Arm)   Pulse 90   Temp 97.7 F (36.5 C) (Oral)   Resp 19   Ht 5' 1"  (1.549 m)   Wt 63.5 kg   SpO2 97%   BMI 26.45 kg/m  Physical Exam Vitals and nursing note reviewed.  Constitutional:      General: She is not in acute distress.    Appearance: Normal appearance. She is normal weight. She is not ill-appearing, toxic-appearing or diaphoretic.  HENT:     Head: Normocephalic and atraumatic.     Nose: Nose normal.     Mouth/Throat:     Mouth: Mucous membranes are moist.  Eyes:     General: Lids are normal. Lids are everted, no foreign bodies appreciated. Vision grossly intact.        Right eye: No foreign body, discharge or hordeolum.        Left eye: No foreign body, discharge or hordeolum.     Intraocular pressure: Right eye pressure is 18 mmHg. Measurements were taken using a handheld tonometer.    Extraocular Movements: Extraocular movements intact.     Right eye: Normal extraocular motion and no nystagmus.     Left eye: Normal extraocular  motion and no nystagmus.     Pupils: Pupils are equal, round, and reactive to light.     Comments: Right eye with mildly erythematous conjunctiva without any purulence.  Periorbital area with mild erythema but no swelling, appears more consistent with skin irritation from persistent eye rubbing than cellulitis.  Eye was stained with fluorescein and numbed with tetracaine which revealed no abrasion, ulceration or other abnormalities.  Eyelid was inverted without evidence of foreign body.  EOMs intact without pain. No chemosis or exophthalmos.  Cardiovascular:     Rate and Rhythm: Normal rate.  Pulmonary:     Effort: Pulmonary effort is normal. No respiratory distress.  Musculoskeletal:        General: Normal range of motion.     Cervical back: Normal range of motion.   Skin:    General: Skin is warm and dry.  Neurological:     General: No focal deficit present.     Mental Status: She is alert.  Psychiatric:        Mood and Affect: Mood normal.        Behavior: Behavior normal.     ED Results / Procedures / Treatments   Labs (all labs ordered are listed, but only abnormal results are displayed) Labs Reviewed - No data to display  EKG None  Radiology No results found.  Procedures Procedures    Medications Ordered in ED Medications  fluorescein ophthalmic strip 1 strip (has no administration in time range)  tetracaine (PONTOCAINE) 0.5 % ophthalmic solution 1-2 drop (has no administration in time range)    ED Course/ Medical Decision Making/ A&P                           Medical Decision Making Risk Prescription drug management.   Patient presents today with right eye pain.  She is afebrile, nontoxic-appearing, and in no acute distress with reassuring vital signs.  Physical exam of the eye did not reveal any abrasion, ulceration or other acute abnormalities. Eye irrigated w NS, no evidence of FB.  No change in vision, acuity equal bilaterally.  Pt is not a contact lens wearer.  Exam non-concerning for orbital cellulitis, hyphema, corneal ulcers.  She does have some periorbital erythema but no swelling, chemosis, exophthalmos, or painful EOMs.  Periorbital erythema more consistent with persistent eye rubbing than cellulitis.  Given that she did have some improvement with erythromycin previously, will discharge her with same.  We will also give another referral to ophthalmology and emphasized the importance of following up with them for further evaluation and management.  Patient is understanding and amenable with plan, educated on red flag symptoms of prompt immediate return.  Discharged stable condition.   This is a shared visit with supervising physician Dr. Tomi Bamberger who has independently evaluated patient & provided guidance in  evaluation/management/disposition, in agreement with care    Final Clinical Impression(s) / ED Diagnoses Final diagnoses:  Conjunctivitis of right eye, unspecified conjunctivitis type    Rx / DC Orders ED Discharge Orders          Ordered    erythromycin ophthalmic ointment        12/06/21 2040          An After Visit Summary was printed and given to the patient.     Nestor Lewandowsky 12/06/21 2051    Dorie Rank, MD 12/07/21 [phone]

## 2021-12-06 NOTE — Discharge Instructions (Signed)
As we discussed, your work-up in the ER today was reassuring for acute abnormalities.  However given that you had some improvement previously with the erythromycin ointment I have refilled your prescription for this.  Additionally, it is very important that you schedule an appointment with ophthalmology and follow-up with them in the next few days.  Return if development of any new or worsening symptoms.

## 2021-12-06 NOTE — ED Triage Notes (Signed)
Pt with irritation to right eye for past 4 days. Denies any vision changes to right eye.

## 2021-12-09 DIAGNOSIS — H35373 Puckering of macula, bilateral: Secondary | ICD-10-CM | POA: Diagnosis not present

## 2021-12-09 DIAGNOSIS — H25813 Combined forms of age-related cataract, bilateral: Secondary | ICD-10-CM | POA: Diagnosis not present

## 2021-12-09 DIAGNOSIS — H5711 Ocular pain, right eye: Secondary | ICD-10-CM | POA: Diagnosis not present

## 2021-12-09 DIAGNOSIS — H04123 Dry eye syndrome of bilateral lacrimal glands: Secondary | ICD-10-CM | POA: Diagnosis not present

## 2021-12-28 ENCOUNTER — Ambulatory Visit (INDEPENDENT_AMBULATORY_CARE_PROVIDER_SITE_OTHER): Payer: Medicare Other | Admitting: Family Medicine

## 2021-12-28 VITALS — BP 142/80 | HR 73 | Temp 98.5°F | Ht 61.0 in | Wt 138.0 lb

## 2021-12-28 DIAGNOSIS — H9113 Presbycusis, bilateral: Secondary | ICD-10-CM | POA: Diagnosis not present

## 2021-12-28 DIAGNOSIS — H9313 Tinnitus, bilateral: Secondary | ICD-10-CM | POA: Diagnosis not present

## 2021-12-28 NOTE — Progress Notes (Signed)
Subjective:    Patient ID: Rebecca Osborne, female    DOB: Oct 23, 1950, 71 y.o.   MRN: [id number]  10/15/21 Patient is concerned because she has tinnitus.  She states that she has had tinnitus for years.  She hears a high-pitched ringing sound in her ears even during conversation.  It has not worsened.  She denies any vertigo.  She does have some mild hearing loss.  She denies any otalgia or sinus pain or headaches or blurry vision or double vision or recent trauma to the head of the years.  She has read and researched that tinnitus is associated with Alzheimer's disease and she is concerned that this means she is going to get Alzheimer's disease.  At that time, my plan was:  I explained to the patient that tinnitus is related to damage to the nerve in the interval year.  There is no association that I am aware of with tinnitus causing Alzheimer's disease.  I reassured the patient that her tinnitus does not mean that she is going to Alzheimer's disease.  Unfortunately, I explained to the patient that we do not have treatment that worked in the other than hearing aids and her hearing loss is not sufficient to require hearing aids at the present time.  12/28/21 Patient states that she is unable to hear out of her ears.  She states she is having a difficult time hearing things that previously she was able to hear such as conversations across the room.  She wants her ears rinsed out.  However there is no wax in her ears.  She states that she can feel scabs in her right auditory canal.  There is some mild scabs on the anterior wall of the auditory canal that looks like she has been scratching with her fingernail.  There may be some inflammation there due to scratching but there is no obvious blood or exudate or pus.  Hearing screen was performed today.  Hearing screen is markedly abnormal.  She has low-frequency hearing loss in the right ear.  She can only hear high frequencies at 40 dB.  In the left ear she also  has some milder hearing loss. Past Medical History:  Diagnosis Date   Allergy    Cancer ([place])    cervical cancer cells   GERD (gastroesophageal reflux disease)    Osteopenia    Past Surgical History:  Procedure Laterality Date   ABDOMINAL HYSTERECTOMY     BIOPSY  10/16/2019   Procedure: BIOPSY;  Surgeon: Daneil Dolin, MD;  [facility];  Service: Endoscopy;;   BOWEL RESECTION N/A 06/22/2016   Procedure: SMALL BOWEL RESECTION;  Surgeon: Vickie Epley, MD;  Location: [facility];  Service: General;  Laterality: N/A;   CHOLECYSTECTOMY  2005   COLONOSCOPY  08/23/2019   Tortuous left colon, external and internal hemorrhoids   COLONOSCOPY N/A 08/23/2019   Procedure: COLONOSCOPY;  Surgeon: Danie Binder, MD;  [facility];  Service: Endoscopy;  Laterality: N/A;  8:30   COLONOSCOPY WITH ESOPHAGOGASTRODUODENOSCOPY (EGD)  09/2009   Fields: Normal colonoscopy.  H. pylori gastritis.  Schatzki ring status post esophageal dilation.  Next colonoscopy 10 years   ESOPHAGOGASTRODUODENOSCOPY N/A 10/16/2019   Rourk: Erosive reflux esophagitis, gastric erosions, duodenal diverticulum.  Biopsies from the stomach showed mild inflammation.  No H. pylori.   FRACTURE SURGERY     rt foot   LAPAROTOMY N/A 06/22/2016   Procedure: EXPLORATORY LAPAROTOMY;  Surgeon: Vickie Epley,  MD;  Location: [facility];  Service: General;  Laterality: N/A;   LYSIS OF ADHESION N/A 06/22/2016   Procedure: LYSIS OF ADHESION;  Surgeon: Vickie Epley, MD;  Location: [facility];  Service: General;  Laterality: N/A;   MALONEY DILATION N/A 10/16/2019   Procedure: Venia Minks DILATION;  Surgeon: Daneil Dolin, MD;  [facility];  Service: Endoscopy;  Laterality: N/A;   Current Outpatient Medications on File Prior to Visit  Medication Sig Dispense Refill   Calcium Carb-Cholecalciferol (CALCIUM 600+D3 PO) Take 1 tablet by mouth daily.      Cholecalciferol (VITAMIN D) 50 MCG (2000 UT) tablet Take 2,000 Units by  mouth daily.     erythromycin ophthalmic ointment Place a 1/2 inch ribbon of ointment into the lower eyelid. 3.5 g 0   famotidine (PEPCID) 20 MG tablet TAKE 1 TABLET BY MOUTH EVERY DAY 90 tablet 3   HM SUPER VITAMIN B12 2500 MCG CHEW Chew 2,500 mcg by mouth daily.     ibuprofen (ADVIL) 200 MG tablet Take 200-400 mg by mouth every 6 (six) hours as needed (for pain.).     No current facility-administered medications on file prior to visit.   Allergies  Allergen Reactions   Codeine Itching   Social History   Socioeconomic History   Marital status: Widowed    Spouse name: Not on file   Number of children: 3   Years of education: Not on file   Highest education level: Not on file  Occupational History   Not on file  Tobacco Use   Smoking status: Never   Smokeless tobacco: Never  Vaping Use   Vaping Use: Never used  Substance and Sexual Activity   Alcohol use: No   Drug use: No   Sexual activity: Not Currently  Other Topics Concern   Not on file  Social History Narrative   Husband died in 2010/08/07.   Social Determinants of Health   Financial Resource Strain: Low Risk  (04/15/2021)   Overall Financial Resource Strain (CARDIA)    Difficulty of Paying Living Expenses: Not hard at all  Food Insecurity: No Food Insecurity (04/15/2021)   Hunger Vital Sign    Worried About Running Out of Food in the Last Year: Never true    Ran Out of Food in the Last Year: Never true  Transportation Needs: No Transportation Needs (04/15/2021)   PRAPARE - Hydrologist (Medical): No    Lack of Transportation (Non-Medical): No  Physical Activity: Sufficiently Active (04/15/2021)   Exercise Vital Sign    Days of Exercise per Week: 5 days    Minutes of Exercise per Session: 30 min  Stress: No Stress Concern Present (04/15/2021)   [place]    Feeling of Stress : Not at all  Social Connections: Moderately  Integrated (04/15/2021)   Social Connection and Isolation Panel [NHANES]    Frequency of Communication with Friends and Family: More than three times a week    Frequency of Social Gatherings with Friends and Family: More than three times a week    Attends Religious Services: More than 4 times per year    Active Member of [employer] or Organizations: Yes    Attends Archivist Meetings: More than 4 times per year    Marital Status: Widowed  Intimate Partner Violence: Not At Risk (04/15/2021)   Humiliation, Afraid, Rape, and Kick questionnaire    Fear of Current  or Ex-Partner: No    Emotionally Abused: No    Physically Abused: No    Sexually Abused: No      Review of Systems  Respiratory:  Positive for cough.   All other systems reviewed and are negative.      Objective:   Physical Exam Vitals reviewed.  Constitutional:      General: She is not in acute distress.    Appearance: Normal appearance. She is not ill-appearing or toxic-appearing.  HENT:     Right Ear: Tympanic membrane, ear canal and external ear normal. There is no impacted cerumen.     Left Ear: Tympanic membrane, ear canal and external ear normal. There is no impacted cerumen.     Nose: Nose normal. No congestion or rhinorrhea.  Cardiovascular:     Rate and Rhythm: Normal rate and regular rhythm.     Pulses: Normal pulses.     Heart sounds: Normal heart sounds. No murmur heard.    No friction rub.  Pulmonary:     Effort: Pulmonary effort is normal. No respiratory distress.     Breath sounds: No stridor. No wheezing, rhonchi or rales.  Musculoskeletal:        General: No swelling.  Lymphadenopathy:     Cervical: No cervical adenopathy.  Skin:    Findings: No bruising, erythema, lesion or rash.  Neurological:     General: No focal deficit present.     Mental Status: She is alert and oriented to person, place, and time. Mental status is at baseline.     Cranial Nerves: No cranial nerve deficit.            Assessment & Plan:  Tinnitus aurium, bilateral  Presbycusis of both ears Patient is convinced that she has a cerumen impaction.  I explained to the patient that she has hearing loss right greater than left.  There is no cerumen impaction.  There is a small scab on the anterior wall of her right auditory canal.  However I was emphatic that irrigating her ear would not help this and I do not feel that it would do anything to help her hearing.  I believe that she has hearing loss.  However the patient did not believe me because she states "she can feel it in there".  Therefore, we attempted irrigation and lavage for the patient in order to show her that there is no wax in her ear as she was not convinced by my explanation.  I continue to recommend a referral to audiology for hearing loss assessment

## 2022-01-05 DIAGNOSIS — H1013 Acute atopic conjunctivitis, bilateral: Secondary | ICD-10-CM | POA: Diagnosis not present

## 2022-01-14 ENCOUNTER — Other Ambulatory Visit: Payer: Self-pay | Admitting: Gastroenterology

## 2022-03-10 DIAGNOSIS — H02135 Senile ectropion of left lower eyelid: Secondary | ICD-10-CM | POA: Diagnosis not present

## 2022-03-10 DIAGNOSIS — H16223 Keratoconjunctivitis sicca, not specified as Sjogren's, bilateral: Secondary | ICD-10-CM | POA: Diagnosis not present

## 2022-03-10 DIAGNOSIS — H02132 Senile ectropion of right lower eyelid: Secondary | ICD-10-CM | POA: Diagnosis not present

## 2022-03-10 DIAGNOSIS — H1013 Acute atopic conjunctivitis, bilateral: Secondary | ICD-10-CM | POA: Diagnosis not present

## 2022-04-20 ENCOUNTER — Telehealth: Payer: Self-pay | Admitting: Family Medicine

## 2022-04-20 NOTE — Telephone Encounter (Signed)
Left message for patient to call back and schedule Medicare Annual Wellness Visit (AWV) in office.   If not able to come in office, please offer to do virtually or by telephone.   Last AWV: 04/15/2021   Please schedule at anytime with [facility]  If any questions, please contact me at [phone].  Thank you,  Colletta [place]

## 2022-05-12 ENCOUNTER — Telehealth: Payer: Self-pay | Admitting: Family Medicine

## 2022-05-12 NOTE — Telephone Encounter (Signed)
Please advise 

## 2022-05-12 NOTE — Telephone Encounter (Signed)
Spoke with patient to make AWV appointment.   She asked that I send a message to the office manager in reference to her being billing co-pays.  She explained that she should not be billed for co-pays

## 2022-05-26 ENCOUNTER — Other Ambulatory Visit: Payer: Self-pay | Admitting: Gastroenterology

## 2022-05-26 NOTE — Telephone Encounter (Signed)
One refill. Needs ov.

## 2022-06-21 ENCOUNTER — Ambulatory Visit (INDEPENDENT_AMBULATORY_CARE_PROVIDER_SITE_OTHER): Payer: 59

## 2022-06-21 DIAGNOSIS — Z0000 Encounter for general adult medical examination without abnormal findings: Secondary | ICD-10-CM | POA: Diagnosis not present

## 2022-06-21 NOTE — Progress Notes (Signed)
Subjective:   Rebecca Osborne is a 72 y.o. female who presents for Medicare Annual (Subsequent) preventive examination.  I connected with  Rebecca Osborne on 06/21/22 by a audio enabled telemedicine application and verified that I am speaking with the correct person using two identifiers.  Patient Location: Home  Provider Location: Office/Clinic  I discussed the limitations of evaluation and management by telemedicine. The patient expressed understanding and agreed to proceed.  Review of Systems     Cardiac Risk Factors include: advanced age (>49mn, >>64women);sedentary lifestyle     Objective:    Today's Vitals   There is no height or weight on file to calculate BMI.     06/21/2022    3:17 PM 10/31/2021    3:54 PM 07/26/2021    4:21 PM 04/15/2021    3:03 PM 07/15/2020    2:31 PM 10/16/2019    9:45 AM 08/23/2019    8:07 AM  Advanced Directives  Does Patient Have a Medical Advance Directive? No No No No No No No  Would patient like information on creating a medical advance directive? No - Patient declined  No - Patient declined No - Patient declined No - Patient declined Yes (Inpatient - patient defers creating a medical advance directive at this time - Information given) No - Patient declined    Current Medications (verified) Outpatient Encounter Medications as of 06/21/2022  Medication Sig   Calcium Carb-Cholecalciferol (CALCIUM 600+D3 PO) Take 1 tablet by mouth daily.    Cholecalciferol (VITAMIN D) 50 MCG (2000 UT) tablet Take 2,000 Units by mouth daily.   famotidine (PEPCID) 20 MG tablet TAKE 1 TABLET BY MOUTH EVERY DAY   HM SUPER VITAMIN B12 2500 MCG CHEW Chew 2,500 mcg by mouth daily.   ibuprofen (ADVIL) 200 MG tablet Take 200-400 mg by mouth every 6 (six) hours as needed (for pain.).   erythromycin ophthalmic ointment Place a 1/2 inch ribbon of ointment into the lower eyelid. (Patient not taking: Reported on 06/21/2022)   No facility-administered encounter medications on  file as of 06/21/2022.    Allergies (verified) Codeine   History: Past Medical History:  Diagnosis Date   [place]    [place]    cervical cancer cells   GERD (gastroesophageal reflux disease)    Osteopenia    Past Surgical History:  Procedure Laterality Date   ABDOMINAL HYSTERECTOMY     BIOPSY  10/16/2019   Procedure: BIOPSY;  Surgeon: RDaneil Dolin MD;  [facility];  Service: Endoscopy;;   BOWEL RESECTION N/A 06/22/2016   Procedure: SMALL BOWEL RESECTION;  Surgeon: JVickie Epley MD;  [facility];  Service: General;  Laterality: N/A;   CHOLECYSTECTOMY  2005   COLONOSCOPY  08/23/2019   Tortuous left colon, external and internal hemorrhoids   COLONOSCOPY N/A 08/23/2019   Procedure: COLONOSCOPY;  Surgeon: FDanie Binder MD;  [facility];  Service: Endoscopy;  Laterality: N/A;  8:30   COLONOSCOPY WITH ESOPHAGOGASTRODUODENOSCOPY (EGD)  09/2009   Fields: Normal colonoscopy.  H. pylori gastritis.  Schatzki ring status post esophageal dilation.  Next colonoscopy 10 years   ESOPHAGOGASTRODUODENOSCOPY N/A 10/16/2019   Rourk: Erosive reflux esophagitis, gastric erosions, duodenal diverticulum.  Biopsies from the stomach showed mild inflammation.  No H. pylori.   FRACTURE SURGERY     rt foot   LAPAROTOMY N/A 06/22/2016   Procedure: EXPLORATORY LAPAROTOMY;  Surgeon: JVickie Epley MD;  [facility];  Service: General;  Laterality: N/A;   LYSIS OF ADHESION N/A 06/22/2016   Procedure: LYSIS OF ADHESION;  Surgeon: Vickie Epley, MD;  Location[facility];  Service: General;  Laterality: N/A;   MALONEY DILATION N/A 10/16/2019   Procedure: Venia Minks DILATION;  Surgeon: Daneil Dolin, MD;  [facility];  Service: Endoscopy;  Laterality: N/A;   Family History  Problem Relation Age of Onset   COPD Mother    Heart disease Mother    5 / [place] Mother    Vision loss Mother        from shingles   COPD Father    Cancer Brother         leukemia   Early death Brother    Colon cancer Neg Hx    Colon polyps Neg Hx    Inflammatory bowel disease Neg Hx    Social History   Socioeconomic History   Marital status: Widowed    Spouse name: Not on file   Number of children: 3   Years of education: Not on file   Highest education level: Not on file  Occupational History   Not on file  Tobacco Use   Smoking status: Never   Smokeless tobacco: Never  Vaping Use   Vaping Use: Never used  Substance and Sexual Activity   Alcohol use: No   Drug use: No   Sexual activity: Not Currently  Other Topics Concern   Not on file  Social History Narrative   Husband died in 08/30/10.   Social Determinants of Health   Financial Resource Strain: Low Risk  (06/21/2022)   Overall Financial Resource Strain (CARDIA)    Difficulty of Paying Living Expenses: Not very hard  Food Insecurity: No Food Insecurity (06/21/2022)   Hunger Vital Sign    Worried About Running Out of Food in the Last Year: Never true    Ran Out of Food in the Last Year: Never true  Transportation Needs: Unmet Transportation Needs (06/21/2022)   PRAPARE - Hydrologist (Medical): Yes    Lack of Transportation (Non-Medical): Yes  Physical Activity: Insufficiently Active (06/21/2022)   Exercise Vital Sign    Days of Exercise per Week: 3 days    Minutes of Exercise per Session: 30 min  Stress: Stress Concern Present (06/21/2022)   [place]    Feeling of Stress : To some extent  Social Connections: Moderately Integrated (06/21/2022)   Social Connection and Isolation Panel [NHANES]    Frequency of Communication with Friends and Family: More than three times a week    Frequency of Social Gatherings with Friends and Family: Three times a week    Attends Religious Services: More than 4 times per year    Active Member of Clubs or Organizations: Yes    Attends Theatre manager Meetings: More than 4 times per year    Marital Status: Widowed    Tobacco Counseling Counseling given: Not Answered   Clinical Intake:  Pre-visit preparation completed: Yes  Pain : No/denies pain  Diabetes: No  How often do you need to have someone help you when you read instructions, pamphlets, or other written materials from your doctor or pharmacy?: 2 - Rarely  Diabetic?No   Interpreter Needed?: No  Information entered by :: Denman George LPN   Activities of Daily Living    06/21/2022    3:17 PM  In your present state of health, do you  have any difficulty performing the following activities:  Hearing? 0  Vision? 0  Difficulty concentrating or making decisions? 0  Walking or climbing stairs? 0  Dressing or bathing? 0  Doing errands, shopping? 0  Preparing Food and eating ? N  Using the Toilet? N  In the past six months, have you accidently leaked urine? N  Do you have problems with loss of bowel control? N  Managing your Medications? N  Managing your Finances? N  Housekeeping or managing your Housekeeping? N    Patient Care Team: Susy Frizzle, MD as PCP - General (Family Medicine)  Indicate any recent Medical Services you may have received from other than [facility] providers in the past year (date may be approximate).     Assessment:   This is a routine wellness examination for Kimara.  Hearing/Vision screen Hearing Screening - Comments:: Denies hearing difficulties   Vision Screening - Comments::  up to date with routine eye exams with Dr. Manuella Ghazi    Dietary issues and exercise activities discussed: Current Exercise Habits: Home exercise routine, Type of exercise: walking, Time (Minutes): 30, Frequency (Times/Week): 3, Weekly Exercise (Minutes/Week): 90, Intensity: Mild   Goals Addressed   None    Depression Screen    06/21/2022    3:16 PM 04/15/2021    3:00 PM 10/22/2018    3:05 PM 06/25/2018   11:36 AM 07/14/2015    9:24 AM  PHQ 2/9  Scores  PHQ - 2 Score 0 0 1 0 0  PHQ- 9 Score   2      Fall Risk    06/21/2022    3:20 PM 04/15/2021    3:06 PM 10/22/2018    3:05 PM 06/25/2018   11:36 AM  Fall Risk   Falls in the past year? 0 0 0 0  Number falls in past yr: 0 0    Injury with Fall? 0 0    Risk for fall due to : No Fall Risks No Fall Risks    Follow up Falls evaluation completed;Education provided;Falls prevention discussed Falls prevention discussed  Falls evaluation completed    FALL RISK PREVENTION PERTAINING TO THE HOME:  Any stairs in or around the home? No  If so, are there any without handrails? No  Home free of loose throw rugs in walkways, pet beds, electrical cords, etc? Yes  Adequate lighting in your home to reduce risk of falls? Yes   ASSISTIVE DEVICES UTILIZED TO PREVENT FALLS:  Life alert? No  Use of a cane, walker or w/c? No  Grab bars in the bathroom? Yes  Shower chair or bench in shower? No  Elevated toilet seat or a handicapped toilet? No   TIMED UP AND GO:  Was the test performed? No . Telephonic visit   Cognitive Function:        06/21/2022    3:18 PM 04/15/2021    3:09 PM  6CIT Screen  What Year? 0 points   What month? 0 points   What time? 0 points 0 points  Count back from 20 0 points 0 points  Months in reverse 0 points 2 points  Repeat phrase 0 points 0 points  Total Score 0 points     Immunizations Immunization History  Administered Date(s) Administered   Fluad Quad(high Dose 65+) 03/16/2019   Influenza, High Dose Seasonal PF 03/10/2018   Influenza-Unspecified 04/15/2021   Pneumococcal Polysaccharide-23 09/08/2016    TDAP status: Due, Education has been provided regarding the importance of  this vaccine. Advised may receive this vaccine at local pharmacy or Health Dept. Aware to provide a copy of the vaccination record if obtained from local pharmacy or Health Dept. Verbalized acceptance and understanding.  Flu Vaccine status: Declined, Education has been  provided regarding the importance of this vaccine but patient still declined. Advised may receive this vaccine at local pharmacy or Health Dept. Aware to provide a copy of the vaccination record if obtained from local pharmacy or Health Dept. Verbalized acceptance and understanding.  Pneumococcal vaccine status: Declined,  Education has been provided regarding the importance of this vaccine but patient still declined. Advised may receive this vaccine at local pharmacy or Health Dept. Aware to provide a copy of the vaccination record if obtained from local pharmacy or Health Dept. Verbalized acceptance and understanding.   Covid-19 vaccine status: Declined, Education has been provided regarding the importance of this vaccine but patient still declined. Advised may receive this vaccine at local pharmacy or Health Dept.or vaccine clinic. Aware to provide a copy of the vaccination record if obtained from local pharmacy or Health Dept. Verbalized acceptance and understanding.  Qualifies for Shingles Vaccine? Yes   Zostavax completed No   Shingrix Completed?: No.    Education has been provided regarding the importance of this vaccine. Patient has been advised to call insurance company to determine out of pocket expense if they have not yet received this vaccine. Advised may also receive vaccine at local pharmacy or Health Dept. Verbalized acceptance and understanding.  Screening Tests Health Maintenance  Topic Date Due   Hepatitis C Screening  Never done   COVID-19 Vaccine (1) 07/07/2022 (Originally 03/29/1951)   INFLUENZA VACCINE  09/04/2022 (Originally 01/04/2022)   Zoster Vaccines- Shingrix (1 of 2) 09/20/2022 (Originally 09/26/2000)   Pneumonia Vaccine 72+ Years old (2 - PCV) 06/22/2023 (Originally 09/08/2017)   MAMMOGRAM  06/03/2023   Medicare Annual Wellness (AWV)  06/22/2023   COLONOSCOPY (Pts 45-78yr Insurance coverage will need to be confirmed)  08/22/2029   DEXA SCAN  Completed   HPV VACCINES   Aged Out   DTaP/Tdap/Td  Discontinued    Health Maintenance  Health Maintenance Due  Topic Date Due   Hepatitis C Screening  Never done    Colorectal cancer screening: Type of screening: Colonoscopy. Completed 08/23/19. Repeat every 10 years  Mammogram status: Completed 06/02/21. Repeat every year  Bone Density status: Completed 06/02/21. Results reflect: Bone density results: OSTEOPENIA. Repeat every 2 years.  Lung Cancer Screening: (Low Dose CT Chest recommended if Age 72-80years, 30 pack-year currently smoking OR have quit w/in 15years.) does not qualify.   Lung Cancer Screening Referral: n/a   Additional Screening:  Hepatitis C Screening: does qualify; Completed at next office visit   Vision Screening: Recommended annual ophthalmology exams for early detection of glaucoma and other disorders of the eye. Is the patient up to date with their annual eye exam?  Yes  Who is the provider or what is the name of the office in which the patient attends annual eye exams? Dr. SManuella Ghazi If pt is not established with a provider, would they like to be referred to a provider to establish care? No .   Dental Screening: Recommended annual dental exams for proper oral hygiene  Community Resource Referral / Chronic Care Management: CRR required this visit?  Yes   CCM required this visit?  No      Plan:     I have personally reviewed and noted the following in  the patient's chart:   Medical and social history Use of alcohol, tobacco or illicit drugs  Current medications and supplements including opioid prescriptions. Patient is not currently taking opioid prescriptions. Functional ability and status Nutritional status Physical activity Advanced directives List of other physicians Hospitalizations, surgeries, and ER visits in previous 12 months Vitals Screenings to include cognitive, depression, and falls Referrals and appointments  In addition, I have reviewed and discussed with  patient certain preventive protocols, quality metrics, and best practice recommendations. A written personalized care plan for preventive services as well as general preventive health recommendations were provided to patient.     Vanetta Mulders, [place]   [id number]   Due to this being a virtual visit, the after visit summary with patients personalized plan was offered to patient via mail or my-chart. per request, patient was mailed a copy of AVS  Nurse Notes: Patient is having problems with transportation due to car needing repairs, she is the only one in the household that drives.

## 2022-06-21 NOTE — Patient Instructions (Signed)
[name] , Thank you for taking time to come for your Medicare Wellness Visit. I appreciate your ongoing commitment to your health goals. Please review the following plan we discussed and let me know if I can assist you in the future.   These are the goals we discussed:  Goals      Have 3 meals a day     Would like to maintain good health, per pt.         This is a list of the screening recommended for you and due dates:  Health Maintenance  Topic Date Due   COVID-19 Vaccine (1) Never done   Hepatitis C Screening: USPSTF Recommendation to screen - Ages 53-79 yo.  Never done   DTaP/Tdap/Td vaccine (1 - Tdap) Never done   Zoster (Shingles) Vaccine (1 of 2) Never done   Pneumonia Vaccine (2 - PCV) 09/08/2017   Flu Shot  01/04/2022   Medicare Annual Wellness Visit  04/15/2022   Mammogram  06/03/2023   Colon Cancer Screening  08/22/2029   DEXA scan (bone density measurement)  Completed   HPV Vaccine  Aged Out    Advanced directives: Forms are available if you choose in the future to pursue completion.  This is recommended in order to make sure that your health wishes are honored in the event that you are unable to verbalize them to the provider.    Conditions/risks identified: Aim for 30 minutes of exercise or brisk walking, 6-8 glasses of water, and 5 servings of fruits and vegetables each day.   Next appointment: Follow up in one year for your annual wellness visit    Preventive Care 65 Years and Older, Female Preventive care refers to lifestyle choices and visits with your health care provider that can promote health and wellness. What does preventive care include? A yearly physical exam. This is also called an annual well check. Dental exams once or twice a year. Routine eye exams. Ask your health care provider how often you should have your eyes checked. Personal lifestyle choices, including: Daily care of your teeth and gums. Regular physical activity. Eating a healthy  diet. Avoiding tobacco and drug use. Limiting alcohol use. Practicing safe sex. Taking low-dose aspirin every day. Taking vitamin and mineral supplements as recommended by your health care provider. What happens during an annual well check? The services and screenings done by your health care provider during your annual well check will depend on your age, overall health, lifestyle risk factors, and family history of disease. Counseling  Your health care provider may ask you questions about your: Alcohol use. Tobacco use. Drug use. Emotional well-being. Home and relationship well-being. Sexual activity. Eating habits. History of falls. Memory and ability to understand (cognition). Work and work Statistician. Reproductive health. Screening  You may have the following tests or measurements: Height, weight, and BMI. Blood pressure. Lipid and cholesterol levels. These may be checked every 5 years, or more frequently if you are over 50 years old. Skin check. Lung cancer screening. You may have this screening every year starting at age 42 if you have a 30-pack-year history of smoking and currently smoke or have quit within the past 15 years. Fecal occult blood test (FOBT) of the stool. You may have this test every year starting at age 64. Flexible sigmoidoscopy or colonoscopy. You may have a sigmoidoscopy every 5 years or a colonoscopy every 10 years starting at age 14. Hepatitis C blood test. Hepatitis B blood test. Sexually transmitted  disease (STD) testing. Diabetes screening. This is done by checking your blood sugar (glucose) after you have not eaten for a while (fasting). You may have this done every 1-3 years. Bone density scan. This is done to screen for osteoporosis. You may have this done starting at age 45. Mammogram. This may be done every 1-2 years. Talk to your health care provider about how often you should have regular mammograms. Talk with your health care provider about  your test results, treatment options, and if necessary, the need for more tests. Vaccines  Your health care provider may recommend certain vaccines, such as: Influenza vaccine. This is recommended every year. Tetanus, diphtheria, and acellular pertussis (Tdap, Td) vaccine. You may need a Td booster every 10 years. Zoster vaccine. You may need this after age 37. Pneumococcal 13-valent conjugate (PCV13) vaccine. One dose is recommended after age 40. Pneumococcal polysaccharide (PPSV23) vaccine. One dose is recommended after age 29. Talk to your health care provider about which screenings and vaccines you need and how often you need them. This information is not intended to replace advice given to you by your health care provider. Make sure you discuss any questions you have with your health care provider. Document Released: 06/19/2015 Document Revised: 02/10/2016 Document Reviewed: 03/24/2015 Elsevier Interactive Patient Education  2017 [place] Prevention in the Home Falls can cause injuries. They can happen to people of all ages. There are many things you can do to make your home safe and to help prevent falls. What can I do on the outside of my home? Regularly fix the edges of walkways and driveways and fix any cracks. Remove anything that might make you trip as you walk through a door, such as a raised step or threshold. Trim any bushes or trees on the path to your home. Use bright outdoor lighting. Clear any walking paths of anything that might make someone trip, such as rocks or tools. Regularly check to see if handrails are loose or broken. Make sure that both sides of any steps have handrails. Any raised decks and porches should have guardrails on the edges. Have any leaves, snow, or ice cleared regularly. Use sand or salt on walking paths during winter. Clean up any spills in your garage right away. This includes oil or grease spills. What can I do in the bathroom? Use  night lights. Install grab bars by the toilet and in the tub and shower. Do not use towel bars as grab bars. Use non-skid mats or decals in the tub or shower. If you need to sit down in the shower, use a plastic, non-slip stool. Keep the floor dry. Clean up any water that spills on the floor as soon as it happens. Remove soap buildup in the tub or shower regularly. Attach bath mats securely with double-sided non-slip rug tape. Do not have throw rugs and other things on the floor that can make you trip. What can I do in the bedroom? Use night lights. Make sure that you have a light by your bed that is easy to reach. Do not use any sheets or blankets that are too big for your bed. They should not hang down onto the floor. Have a firm chair that has side arms. You can use this for support while you get dressed. Do not have throw rugs and other things on the floor that can make you trip. What can I do in the kitchen? Clean up any spills right away. Avoid walking  on wet floors. Keep items that you use a lot in easy-to-reach places. If you need to reach something above you, use a strong step stool that has a grab bar. Keep electrical cords out of the way. Do not use floor polish or wax that makes floors slippery. If you must use wax, use non-skid floor wax. Do not have throw rugs and other things on the floor that can make you trip. What can I do with my stairs? Do not leave any items on the stairs. Make sure that there are handrails on both sides of the stairs and use them. Fix handrails that are broken or loose. Make sure that handrails are as long as the stairways. Check any carpeting to make sure that it is firmly attached to the stairs. Fix any carpet that is loose or worn. Avoid having throw rugs at the top or bottom of the stairs. If you do have throw rugs, attach them to the floor with carpet tape. Make sure that you have a light switch at the top of the stairs and the bottom of the  stairs. If you do not have them, ask someone to add them for you. What else can I do to help prevent falls? Wear shoes that: Do not have high heels. Have rubber bottoms. Are comfortable and fit you well. Are closed at the toe. Do not wear sandals. If you use a stepladder: Make sure that it is fully opened. Do not climb a closed stepladder. Make sure that both sides of the stepladder are locked into place. Ask someone to hold it for you, if possible. Clearly mark and make sure that you can see: Any grab bars or handrails. First and last steps. Where the edge of each step is. Use tools that help you move around (mobility aids) if they are needed. These include: Canes. Walkers. Scooters. Crutches. Turn on the lights when you go into a dark area. Replace any light bulbs as soon as they burn out. Set up your furniture so you have a clear path. Avoid moving your furniture around. If any of your floors are uneven, fix them. If there are any pets around you, be aware of where they are. Review your medicines with your doctor. Some medicines can make you feel dizzy. This can increase your chance of falling. Ask your doctor what other things that you can do to help prevent falls. This information is not intended to replace advice given to you by your health care provider. Make sure you discuss any questions you have with your health care provider. Document Released: 03/19/2009 Document Revised: 10/29/2015 Document Reviewed: 06/27/2014 Elsevier Interactive Patient Education  2017 [employer].

## 2022-07-05 ENCOUNTER — Ambulatory Visit: Payer: Self-pay | Admitting: Gastroenterology

## 2022-07-21 ENCOUNTER — Other Ambulatory Visit (HOSPITAL_COMMUNITY): Payer: Self-pay | Admitting: Family Medicine

## 2022-07-21 DIAGNOSIS — Z1231 Encounter for screening mammogram for malignant neoplasm of breast: Secondary | ICD-10-CM

## 2022-07-28 ENCOUNTER — Ambulatory Visit (HOSPITAL_COMMUNITY)
Admission: RE | Admit: 2022-07-28 | Discharge: 2022-07-28 | Disposition: A | Discharge status: Home / Self Care | Payer: 59 | Source: Ambulatory Visit | Attending: Family Medicine | Admitting: Family Medicine

## 2022-07-28 ENCOUNTER — Encounter (HOSPITAL_COMMUNITY): Payer: Self-pay

## 2022-07-28 DIAGNOSIS — Z1231 Encounter for screening mammogram for malignant neoplasm of breast: Secondary | ICD-10-CM | POA: Diagnosis not present

## 2022-08-03 NOTE — Progress Notes (Deleted)
GI Office Note    Referring Provider: Susy Frizzle, MD Primary Care Physician:  Susy Frizzle, MD Primary Gastroenterologist: Cristopher Estimable.[email or web address], MD  Date:  08/03/2022  ID:  Rebecca Osborne, DOB 1950/09/12, MRN [id number]   Chief Complaint   No chief complaint on file.   History of Present Illness  Rebecca Osborne is a 72 y.o. female with a history of GERD, dysphagia s/p dilation in 2021, C. difficile, osteopenia presenting today for visit to obtain further medication refills.  Colonoscopy March 2021 by Dr. Oneida Alar: -Tortuous left colon -External and internal hemorrhoids -Follow high-fiber diet  EGD May 2021 by Dr. Gala Romney: -Erosive reflux esophagitis -Venia Minks dilation -Hiatal hernia -Gastric erosions s/p biopsy (nonspecific reactive gastropathy, negative H. Pylori) -Duodenal diverticulum -Start Aciphex 20 mg daily, stop Pepcid  Last office visit 02/05/2020.  Dysphagia resolved.  Doing well.  Reflux controlled with Aciphex 20 mg daily.  No heartburn, vomiting, abdominal pain, weight loss, melena, BRBPR.  Having regular bowel movements.   Current Outpatient Medications  Medication Sig Dispense Refill   Calcium Carb-Cholecalciferol (CALCIUM 600+D3 PO) Take 1 tablet by mouth daily.      Cholecalciferol (VITAMIN D) 50 MCG (2000 UT) tablet Take 2,000 Units by mouth daily.     erythromycin ophthalmic ointment Place a 1/2 inch ribbon of ointment into the lower eyelid. (Patient not taking: Reported on 06/21/2022) 3.5 g 0   famotidine (PEPCID) 20 MG tablet TAKE 1 TABLET BY MOUTH EVERY DAY 90 tablet 0   HM SUPER VITAMIN B12 2500 MCG CHEW Chew 2,500 mcg by mouth daily.     ibuprofen (ADVIL) 200 MG tablet Take 200-400 mg by mouth every 6 (six) hours as needed (for pain.).     No current facility-administered medications for this visit.    Past Medical History:  Diagnosis Date   Allergy    Cancer ([place])    cervical cancer cells   GERD (gastroesophageal reflux disease)    Osteopenia      Past Surgical History:  Procedure Laterality Date   ABDOMINAL HYSTERECTOMY     BIOPSY  10/16/2019   Procedure: BIOPSY;  Surgeon: Daneil Dolin, MD;  [facility];  Service: Endoscopy;;   BOWEL RESECTION N/A 06/22/2016   Procedure: SMALL BOWEL RESECTION;  Surgeon: Vickie Epley, MD;  Location: [facility];  Service: General;  Laterality: N/A;   CHOLECYSTECTOMY  2005   COLONOSCOPY  08/23/2019   Tortuous left colon, external and internal hemorrhoids   COLONOSCOPY N/A 08/23/2019   Procedure: COLONOSCOPY;  Surgeon: Danie Binder, MD;  [facility];  Service: Endoscopy;  Laterality: N/A;  8:30   COLONOSCOPY WITH ESOPHAGOGASTRODUODENOSCOPY (EGD)  09/2009   Fields: Normal colonoscopy.  H. pylori gastritis.  Schatzki ring status post esophageal dilation.  Next colonoscopy 10 years   ESOPHAGOGASTRODUODENOSCOPY N/A 10/16/2019   Rourk: Erosive reflux esophagitis, gastric erosions, duodenal diverticulum.  Biopsies from the stomach showed mild inflammation.  No H. pylori.   FRACTURE SURGERY     rt foot   LAPAROTOMY N/A 06/22/2016   Procedure: EXPLORATORY LAPAROTOMY;  Surgeon: Vickie Epley, MD;  Location: [facility];  Service: General;  Laterality: N/A;   LYSIS OF ADHESION N/A 06/22/2016   Procedure: LYSIS OF ADHESION;  Surgeon: Vickie Epley, MD;  Location: [facility];  Service: General;  Laterality: N/A;   MALONEY DILATION N/A 10/16/2019   Procedure: Venia Minks DILATION;  Surgeon: Daneil Dolin, MD;  [facility];  Service:  Endoscopy;  Laterality: N/A;    Family History  Problem Relation Age of Onset   COPD Mother    Heart disease Mother    28 / [place] Mother    Vision loss Mother        from shingles   COPD Father    Cancer Brother        leukemia   Early death Brother    Colon cancer Neg Hx    Colon polyps Neg Hx    Inflammatory bowel disease Neg Hx     Allergies as of 08/04/2022 - Review Complete 06/21/2022  Allergen Reaction Noted    Codeine Itching     Social History   Socioeconomic History   Marital status: Widowed    Spouse name: Not on file   Number of children: 3   Years of education: Not on file   Highest education level: Not on file  Occupational History   Not on file  Tobacco Use   Smoking status: Never   Smokeless tobacco: Never  Vaping Use   Vaping Use: Never used  Substance and Sexual Activity   Alcohol use: No   Drug use: No   Sexual activity: Not Currently  Other Topics Concern   Not on file  Social History Narrative   Husband died in 2010-08-17.   Social Determinants of Health   Financial Resource Strain: Low Risk  (06/21/2022)   Overall Financial Resource Strain (CARDIA)    Difficulty of Paying Living Expenses: Not very hard  Food Insecurity: No Food Insecurity (06/21/2022)   Hunger Vital Sign    Worried About Running Out of Food in the Last Year: Never true    Ran Out of Food in the Last Year: Never true  Transportation Needs: Unmet Transportation Needs (06/21/2022)   PRAPARE - Hydrologist (Medical): Yes    Lack of Transportation (Non-Medical): Yes  Physical Activity: Insufficiently Active (06/21/2022)   Exercise Vital Sign    Days of Exercise per Week: 3 days    Minutes of Exercise per Session: 30 min  Stress: Stress Concern Present (06/21/2022)   [place]    Feeling of Stress : To some extent  Social Connections: Moderately Integrated (06/21/2022)   Social Connection and Isolation Panel [NHANES]    Frequency of Communication with Friends and Family: More than three times a week    Frequency of Social Gatherings with Friends and Family: Three times a week    Attends Religious Services: More than 4 times per year    Active Member of Clubs or Organizations: Yes    Attends Archivist Meetings: More than 4 times per year    Marital Status: Widowed     Review of Systems   Gen:  Denies fever, chills, anorexia. Denies fatigue, weakness, weight loss.  CV: Denies chest pain, palpitations, syncope, peripheral edema, and claudication. Resp: Denies dyspnea at rest, cough, wheezing, coughing up blood, and pleurisy. GI: See HPI Derm: Denies rash, itching, dry skin Psych: Denies depression, anxiety, memory loss, confusion. No homicidal or suicidal ideation.  Heme: Denies bruising, bleeding, and enlarged lymph nodes.   Physical Exam   There were no vitals taken for this visit.  General:   Alert and oriented. No distress noted. Pleasant and cooperative.  Head:  Normocephalic and atraumatic. Eyes:  Conjuctiva clear without scleral icterus. Mouth:  Oral mucosa pink and moist. Good dentition. No lesions. Lungs:  Clear  to auscultation bilaterally. No wheezes, rales, or rhonchi. No distress.  Heart:  S1, S2 present without murmurs appreciated.  Abdomen:  +BS, soft, non-tender and non-distended. No rebound or guarding. No HSM or masses noted. Rectal: *** Msk:  Symmetrical without gross deformities. Normal posture. Extremities:  Without edema. Neurologic:  Alert and  oriented x4 Psych:  Alert and cooperative. Normal mood and affect.   Assessment  Rebecca Osborne is a 72 y.o. female with a history of GERD, dysphagia s/p dilation in 2021, C. difficile, osteopenia presenting today for visit to obtain further medication refills.  GERD:   PLAN   ***     Venetia Night, MSN, FNP-BC, AGACNP-BC [facility]

## 2022-08-04 ENCOUNTER — Ambulatory Visit: Payer: 59 | Admitting: Gastroenterology

## 2022-08-04 ENCOUNTER — Encounter: Payer: Self-pay | Admitting: Radiology

## 2022-09-08 ENCOUNTER — Encounter: Payer: Self-pay | Admitting: Family Medicine

## 2022-09-08 ENCOUNTER — Ambulatory Visit (INDEPENDENT_AMBULATORY_CARE_PROVIDER_SITE_OTHER): Payer: 59 | Admitting: Family Medicine

## 2022-09-08 VITALS — BP 142/86 | HR 81 | Temp 97.9°F | Ht 61.0 in | Wt 148.4 lb

## 2022-09-08 DIAGNOSIS — I83813 Varicose veins of bilateral lower extremities with pain: Secondary | ICD-10-CM

## 2022-09-08 NOTE — Progress Notes (Signed)
Subjective:    Patient ID: Rebecca Osborne, female    DOB: [birth year], 72 y.o.   MRN: [id number]  Patient presents today complaining of pain and swelling in both legs left greater than right.  She has numerous varicose veins both legs.  She is tender to palpation over the varicose veins particularly over the medial thigh just above her knee.  She reports swelling in that area although there is no palpable edema.  There is no edema or swelling or effusion popliteal fossa.  There is no pitting edema in the left lower extremity although the patient feels like it feels swollen.  There are scattered varicose veins all throughout the left leg.  She is concerned that she has a blood clot.  There is no erythema.  There is no redness or warmth.  She has presented similarly in the past.  Past Medical History:  Diagnosis Date   Allergy    Cancer    cervical cancer cells   GERD (gastroesophageal reflux disease)    Osteopenia    Past Surgical History:  Procedure Laterality Date   ABDOMINAL HYSTERECTOMY     BIOPSY  10/16/2019   Procedure: BIOPSY;  Surgeon: Daneil Dolin, MD;  [facility];  Service: Endoscopy;;   BOWEL RESECTION N/A 06/22/2016   Procedure: SMALL BOWEL RESECTION;  Surgeon: Vickie Epley, MD;  Location: [facility];  Service: General;  Laterality: N/A;   CHOLECYSTECTOMY  2005   COLONOSCOPY  08/23/2019   Tortuous left colon, external and internal hemorrhoids   COLONOSCOPY N/A 08/23/2019   Procedure: COLONOSCOPY;  Surgeon: Danie Binder, MD;  [facility];  Service: Endoscopy;  Laterality: N/A;  8:30   COLONOSCOPY WITH ESOPHAGOGASTRODUODENOSCOPY (EGD)  09/2009   Fields: Normal colonoscopy.  H. pylori gastritis.  Schatzki ring status post esophageal dilation.  Next colonoscopy 10 years   ESOPHAGOGASTRODUODENOSCOPY N/A 10/16/2019   Rourk: Erosive reflux esophagitis, gastric erosions, duodenal diverticulum.  Biopsies from the stomach showed mild inflammation.  No H.  pylori.   FRACTURE SURGERY     rt foot   LAPAROTOMY N/A 06/22/2016   Procedure: EXPLORATORY LAPAROTOMY;  Surgeon: Vickie Epley, MD;  Location[facility];  Service: General;  Laterality: N/A;   LYSIS OF ADHESION N/A 06/22/2016   Procedure: LYSIS OF ADHESION;  Surgeon: Vickie Epley, MD;  Location[facility];  Service: General;  Laterality: N/A;   MALONEY DILATION N/A 10/16/2019   Procedure: Venia Minks DILATION;  Surgeon: Daneil Dolin, MD;  [facility];  Service: Endoscopy;  Laterality: N/A;   Current Outpatient Medications on File Prior to Visit  Medication Sig Dispense Refill   Calcium Carb-Cholecalciferol (CALCIUM 600+D3 PO) Take 1 tablet by mouth daily.      Cholecalciferol (VITAMIN D) 50 MCG (2000 UT) tablet Take 2,000 Units by mouth daily.     famotidine (PEPCID) 20 MG tablet TAKE 1 TABLET BY MOUTH EVERY DAY 90 tablet 0   HM SUPER VITAMIN B12 2500 MCG CHEW Chew 2,500 mcg by mouth daily.     ibuprofen (ADVIL) 200 MG tablet Take 200-400 mg by mouth every 6 (six) hours as needed (for pain.).     No current facility-administered medications on file prior to visit.   Allergies  Allergen Reactions   Codeine Itching   Social History   Socioeconomic History   Marital status: Widowed    Spouse name: Not on file   Number of children: 3   Years of  education: Not on file   Highest education level: Not on file  Occupational History   Not on file  Tobacco Use   Smoking status: Never   Smokeless tobacco: Never  Vaping Use   Vaping Use: Never used  Substance and Sexual Activity   Alcohol use: No   Drug use: No   Sexual activity: Not Currently  Other Topics Concern   Not on file  Social History Narrative   Husband died in 2010/10/07.   Social Determinants of Health   Financial Resource Strain: Low Risk  (06/21/2022)   Overall Financial Resource Strain (CARDIA)    Difficulty of Paying Living Expenses: Not very hard  Food Insecurity: No Food Insecurity (06/21/2022)   Hunger  Vital Sign    Worried About Running Out of Food in the Last Year: Never true    Ran Out of Food in the Last Year: Never true  Transportation Needs: Unmet Transportation Needs (06/21/2022)   PRAPARE - Hydrologist (Medical): Yes    Lack of Transportation (Non-Medical): Yes  Physical Activity: Insufficiently Active (06/21/2022)   Exercise Vital Sign    Days of Exercise per Week: 3 days    Minutes of Exercise per Session: 30 min  Stress: Stress Concern Present (06/21/2022)   [place]    Feeling of Stress : To some extent  Social Connections: Moderately Integrated (06/21/2022)   Social Connection and Isolation Panel [NHANES]    Frequency of Communication with Friends and Family: More than three times a week    Frequency of Social Gatherings with Friends and Family: Three times a week    Attends Religious Services: More than 4 times per year    Active Member of Clubs or Organizations: Yes    Attends Archivist Meetings: More than 4 times per year    Marital Status: Widowed  Intimate Partner Violence: Not At Risk (06/21/2022)   Humiliation, Afraid, Rape, and Kick questionnaire    Fear of Current or Ex-Partner: No    Emotionally Abused: No    Physically Abused: No    Sexually Abused: No      Review of Systems  All other systems reviewed and are negative.      Objective:   Physical Exam Vitals reviewed.  Constitutional:      General: She is not in acute distress.    Appearance: Normal appearance. She is not ill-appearing or toxic-appearing.  Cardiovascular:     Rate and Rhythm: Normal rate and regular rhythm.     Pulses: Normal pulses.     Heart sounds: Normal heart sounds. No murmur heard.    No friction rub.  Pulmonary:     Effort: Pulmonary effort is normal. No respiratory distress.     Breath sounds: No stridor. No wheezing, rhonchi or rales.  Abdominal:     General:  Abdomen is flat. Bowel sounds are normal.     Palpations: Abdomen is soft.     Tenderness: There is no abdominal tenderness. There is no rebound.  Musculoskeletal:        General: Deformity present. No swelling.     Right lower leg: No edema.     Left lower leg: Tenderness present. No edema.       Legs:  Lymphadenopathy:     Cervical: No cervical adenopathy.  Skin:    Findings: No bruising, erythema, lesion or rash.  Neurological:     Mental Status:  She is alert.           Assessment & Plan:  Varicose veins of both lower extremities with pain - Plan: D-dimer, quantitative I believe the majority of the pain in her leg is likely due to chronic venous insufficiency.  In the past I have recommended compression hose therapy.  Patient is very worried that she has a blood clot.  I will check a D-dimer to try to give her peace of mind.  I tried to assuage her that I do not feel this is a sign of a blood clot however I can tell the patient was still extremely worried.  Therefore I hope the negative D-dimer would give her peace of mind.  I will consult a vein clinic to discuss treatment options she has tried and failed compression hose

## 2022-09-09 ENCOUNTER — Other Ambulatory Visit: Payer: Self-pay | Admitting: Family Medicine

## 2022-09-09 DIAGNOSIS — R7989 Other specified abnormal findings of blood chemistry: Secondary | ICD-10-CM

## 2022-09-09 LAB — D-DIMER, QUANTITATIVE: D-Dimer, Quant: 0.77 mcg/mL FEU — ABNORMAL HIGH (ref <0.50)

## 2022-09-25 ENCOUNTER — Other Ambulatory Visit: Payer: Self-pay | Admitting: Gastroenterology

## 2022-09-29 ENCOUNTER — Telehealth: Payer: Self-pay

## 2022-09-29 ENCOUNTER — Ambulatory Visit
Admission: RE | Admit: 2022-09-29 | Discharge: 2022-09-29 | Disposition: A | Discharge status: Home / Self Care | Payer: 59 | Source: Ambulatory Visit | Attending: Family Medicine | Admitting: Family Medicine

## 2022-09-29 DIAGNOSIS — M79605 Pain in left leg: Secondary | ICD-10-CM | POA: Diagnosis not present

## 2022-09-29 DIAGNOSIS — I872 Venous insufficiency (chronic) (peripheral): Secondary | ICD-10-CM | POA: Diagnosis not present

## 2022-09-29 DIAGNOSIS — R791 Abnormal coagulation profile: Secondary | ICD-10-CM | POA: Diagnosis not present

## 2022-09-29 DIAGNOSIS — M79604 Pain in right leg: Secondary | ICD-10-CM | POA: Diagnosis not present

## 2022-09-29 DIAGNOSIS — R7989 Other specified abnormal findings of blood chemistry: Secondary | ICD-10-CM

## 2022-09-29 NOTE — Telephone Encounter (Signed)
Rebecca Osborne with [facility] called and states pt's venous doppler report is in Epic and has been read. Thank you.

## 2022-10-06 ENCOUNTER — Other Ambulatory Visit: Payer: Self-pay | Admitting: *Deleted

## 2022-10-06 DIAGNOSIS — I8393 Asymptomatic varicose veins of bilateral lower extremities: Secondary | ICD-10-CM

## 2022-10-12 NOTE — Progress Notes (Unsigned)
VASCULAR & VEIN SPECIALISTS           OF [place]  History and Physical   Rebecca Osborne is a 72 y.o. female who presents with leg swelling and pain  She was seen by Dr. Arbie Cookey in 2020 for right leg swelling.  She had a fracture of the toes on her right foot in September 2019 and had severe pain and had been out of work since that time.  She was having pain with walking and significant swelling limited to her right foot as well.  She had also had an ankle sprain walking in her orthotic boot earlier that week and had progressive pain.  She did not have hx of DVT or PAD.  She was found to have normal arterial blood flow and a near normal venous study.    She was seen by her PCP on 09/08/2022 and was extremely worried about blood clots.  He felt that her pain was due to chronic venous insufficiency.  She had a venous study on 09/29/2022 that revealed no evidence of DVT in either extremity.    She states that her brother's wife died with blood clots and she was extremely worried when she developed lower extremity pain and swelling.    She comes in today with complaints of leg swelling and pain.  She states they both hurt.  She does not have any hx of DVT.   She has never had any procedures on her legs.  She states her mother had trouble with swelling and varicose veins.  She does not have any skin color changes.  She has hx of small bowel resection with lower laparotomy incision. She does not wear compression.  She states that the toes on the right foot are stuck together after she had some injections in her feet.       The pt is not on a statin for cholesterol management.  The pt is not on a daily aspirin.   Other AC:  none The pt is not on medication for hypertension.   The pt is is not on medication for diabetes.   Tobacco hx:  never  Pt does not have family hx of AAA.  Past Medical History:  Diagnosis Date   Allergy    Cancer (HCC)    cervical cancer cells   GERD  (gastroesophageal reflux disease)    Osteopenia     Past Surgical History:  Procedure Laterality Date   ABDOMINAL HYSTERECTOMY     BIOPSY  10/16/2019   Procedure: BIOPSY;  Surgeon: Corbin Ade, MD;  [facility];  Service: Endoscopy;;   BOWEL RESECTION N/A 06/22/2016   Procedure: SMALL BOWEL RESECTION;  Surgeon: Ancil Linsey, MD;  Location: [facility];  Service: General;  Laterality: N/A;   CHOLECYSTECTOMY  2005   COLONOSCOPY  08/23/2019   Tortuous left colon, external and internal hemorrhoids   COLONOSCOPY N/A 08/23/2019   Procedure: COLONOSCOPY;  Surgeon: West Bali, MD;  Location: [facility];  Service: Endoscopy;  Laterality: N/A;  8:30   COLONOSCOPY WITH ESOPHAGOGASTRODUODENOSCOPY (EGD)  09/2009   Fields: Normal colonoscopy.  H. pylori gastritis.  Schatzki ring status post esophageal dilation.  Next colonoscopy 10 years   ESOPHAGOGASTRODUODENOSCOPY N/A 10/16/2019   Rourk: Erosive reflux esophagitis, gastric erosions, duodenal diverticulum.  Biopsies from the stomach showed mild inflammation.  No H. pylori.   FRACTURE SURGERY     rt foot  LAPAROTOMY N/A 06/22/2016   Procedure: EXPLORATORY LAPAROTOMY;  Surgeon: Ancil Linsey, MD;  Location: [facility];  Service: General;  Laterality: N/A;   LYSIS OF ADHESION N/A 06/22/2016   Procedure: LYSIS OF ADHESION;  Surgeon: Ancil Linsey, MD;  Location: [facility];  Service: General;  Laterality: N/A;   MALONEY DILATION N/A 10/16/2019   Procedure: Alvy Beal;  Surgeon: Corbin Ade, MD;  [facility];  Service: Endoscopy;  Laterality: N/A;    Social History   Socioeconomic History   Marital status: Widowed    Spouse name: Not on file   Number of children: 3   Years of education: Not on file   Highest education level: Not on file  Occupational History   Not on file  Tobacco Use   Smoking status: Never   Smokeless tobacco: Never  Vaping Use   Vaping Use: Never used  Substance and Sexual Activity    Alcohol use: No   Drug use: No   Sexual activity: Not Currently  Other Topics Concern   Not on file  Social History Narrative   Husband died in 11/24/10.   Social Determinants of Health   Financial Resource Strain: Low Risk  (06/21/2022)   Overall Financial Resource Strain (CARDIA)    Difficulty of Paying Living Expenses: Not very hard  Food Insecurity: No Food Insecurity (06/21/2022)   Hunger Vital Sign    Worried About Running Out of Food in the Last Year: Never true    Ran Out of Food in the Last Year: Never true  Transportation Needs: Unmet Transportation Needs (06/21/2022)   PRAPARE - Administrator, Civil Service (Medical): Yes    Lack of Transportation (Non-Medical): Yes  Physical Activity: Insufficiently Active (06/21/2022)   Exercise Vital Sign    Days of Exercise per Week: 3 days    Minutes of Exercise per Session: 30 min  Stress: Stress Concern Present (06/21/2022)   [employer] of Occupational Health - Occupational Stress Questionnaire    Feeling of Stress : To some extent  Social Connections: Moderately Integrated (06/21/2022)   Social Connection and Isolation Panel [NHANES]    Frequency of Communication with Friends and Family: More than three times a week    Frequency of Social Gatherings with Friends and Family: Three times a week    Attends Religious Services: More than 4 times per year    Active Member of Clubs or Organizations: Yes    Attends Banker Meetings: More than 4 times per year    Marital Status: Widowed  Intimate Partner Violence: Not At Risk (06/21/2022)   Humiliation, Afraid, Rape, and Kick questionnaire    Fear of Current or Ex-Partner: No    Emotionally Abused: No    Physically Abused: No    Sexually Abused: No    Family History  Problem Relation Age of Onset   COPD Mother    Heart disease Mother    Miscarriages / [place] Mother    Vision loss Mother        from shingles   COPD Father    Cancer Brother         leukemia   Early death Brother    Colon cancer Neg Hx    Colon polyps Neg Hx    Inflammatory bowel disease Neg Hx     Current Outpatient Medications  Medication Sig Dispense Refill   Calcium Carb-Cholecalciferol (CALCIUM 600+D3 PO) Take 1 tablet by mouth daily.  Cholecalciferol (VITAMIN D) 50 MCG (2000 UT) tablet Take 2,000 Units by mouth daily.     famotidine (PEPCID) 20 MG tablet TAKE 1 TABLET BY MOUTH EVERY DAY 90 tablet 0   HM SUPER VITAMIN B12 2500 MCG CHEW Chew 2,500 mcg by mouth daily.     ibuprofen (ADVIL) 200 MG tablet Take 200-400 mg by mouth every 6 (six) hours as needed (for pain.).     No current facility-administered medications for this visit.    Allergies  Allergen Reactions   Codeine Itching    REVIEW OF SYSTEMS:   [X]  denotes positive finding, [ ]  denotes negative finding Cardiac  Comments:  Chest pain or chest pressure:    Shortness of breath upon exertion:    Short of breath when lying flat:    Irregular heart rhythm:        Vascular    Pain in calf, thigh, or hip brought on by ambulation:    Pain in feet at night that wakes you up from your sleep:     Blood clot in your veins:    Leg swelling:  x See HPI      Pulmonary    Oxygen at home:    Productive cough:     Wheezing:         Neurologic    Sudden weakness in arms or legs:     Sudden numbness in arms or legs:     Sudden onset of difficulty speaking or slurred speech:    Temporary loss of vision in one eye:     Problems with dizziness:         Gastrointestinal    Blood in stool:     Vomited blood:         Genitourinary    Burning when urinating:     Blood in urine:        Psychiatric    Major depression:         Hematologic    Bleeding problems:    Problems with blood clotting too easily:        Skin    Rashes or ulcers:        Constitutional    Fever or chills:      PHYSICAL EXAMINATION:  Today's Vitals   10/13/22 1216  BP: (!) 158/80  Pulse: 74  Resp: 18   Temp: 98.7 F (37.1 C)  TempSrc: Temporal  SpO2: 97%  Weight: 145 lb (65.8 kg)  Height: 5\' 1"  (1.549 m)  PainSc: 0-No pain   Body mass index is 27.4 kg/m.   General:  WDWN in NAD; vital signs documented above Gait: Not observed HENT: WNL, normocephalic Pulmonary: normal non-labored breathing without wheezing Cardiac: regular HR; without carotid bruits Abdomen: soft, NT, aortic pulse is not palpable Skin: without rashes Vascular Exam/Pulses:  Right Left  Radial 2+ (normal) 2+ (normal)  DP 2+ (normal) 2+ (normal)   Extremities: numerous spider veins BLE with BLE swelling  Neurologic: A&O X 3;  moving all extremities equally Psychiatric:  The pt has Normal affect.   Non-Invasive Vascular Imaging:   Venous duplex on 10/13/2022: Venous Reflux Times  +--------------+---------+------+-----------+------------+--------+  RIGHT        Reflux NoRefluxReflux TimeDiameter cmsComments                          Yes                                   +--------------+---------+------+-----------+------------+--------+  CFV          no                                              +--------------+---------+------+-----------+------------+--------+  FV mid        no                                              +--------------+---------+------+-----------+------------+--------+  Popliteal    no                                              +--------------+---------+------+-----------+------------+--------+  GSV at SFJ              yes    >500 ms      0.62              +--------------+---------+------+-----------+------------+--------+  GSV prox thighno                            0.40              +--------------+---------+------+-----------+------------+--------+  GSV mid thigh no                            0.34              +--------------+---------+------+-----------+------------+--------+  GSV dist thighno                             0.28              +--------------+---------+------+-----------+------------+--------+  GSV at knee   no                            0.30              +--------------+---------+------+-----------+------------+--------+  SSV Pop Fossa no                            0.29              +--------------+---------+------+-----------+------------+--------+   Summary:  Right:  - No evidence of deep vein thrombosis from the common femoral through the popliteal veins.  - No evidence of superficial venous thrombosis.  - The deep venous system is competent.  - The great and small saphenous veins are competent.     Rebecca Osborne is a 72 y.o. female who presents with: BLE swelling and pain    -pt has easily palpable DP pedal pulses bilaterally -pt does not have evidence of DVT.  Pt does have venous reflux only in the GSV at the SVJ on the right.  She is not a candidate for laser ablation.  She does have numerous spider veins bilaterally and we discussed that she can undergo sclerotherapy, however this is not covered by insurance.   -discussed with pt about wearing knee high 15-20 mmHg compression stockings -discussed the importance of leg elevation and how to elevate  properly - pt is advised to elevate their legs and a diagram is given to them to demonstrate for pt to lay flat on their back with knees elevated and slightly bent with their feet higher than their knees, which puts their feet higher than their heart for 15 minutes per day.  If pt cannot lay flat, advised to lay as flat as possible.  -pt is advised to continue as much walking as possible and avoid sitting or standing for long periods of time.  -discussed importance of maintaining a healthy weight and exercise and that water aerobics would also be beneficial.  -handout with recommendations given -pt will f/u as needed.  She will try the conservative measures to see if she gets relief but if she does not or it worsens, she will  call us back to schedule venous reflux for left leg.    Doreatha Massed, [facility] Ve[facility] [phone]  Clinic MD:  Edilia Bo

## 2022-10-13 ENCOUNTER — Ambulatory Visit (HOSPITAL_COMMUNITY)
Admission: RE | Admit: 2022-10-13 | Discharge: 2022-10-13 | Disposition: A | Discharge status: Home / Self Care | Payer: 59 | Source: Ambulatory Visit | Attending: Vascular Surgery | Admitting: Vascular Surgery

## 2022-10-13 ENCOUNTER — Ambulatory Visit (INDEPENDENT_AMBULATORY_CARE_PROVIDER_SITE_OTHER): Payer: 59 | Admitting: Physician Assistant

## 2022-10-13 VITALS — BP 158/80 | HR 74 | Temp 98.7°F | Resp 18 | Ht 61.0 in | Wt 145.0 lb

## 2022-10-13 DIAGNOSIS — M7989 Other specified soft tissue disorders: Secondary | ICD-10-CM | POA: Diagnosis not present

## 2022-10-13 DIAGNOSIS — I8393 Asymptomatic varicose veins of bilateral lower extremities: Secondary | ICD-10-CM | POA: Diagnosis not present

## 2022-11-23 ENCOUNTER — Other Ambulatory Visit: Payer: Self-pay | Admitting: Gastroenterology

## 2022-12-02 ENCOUNTER — Encounter (HOSPITAL_COMMUNITY): Payer: Self-pay | Admitting: Emergency Medicine

## 2022-12-02 ENCOUNTER — Other Ambulatory Visit: Payer: Self-pay

## 2022-12-02 ENCOUNTER — Emergency Department (HOSPITAL_COMMUNITY)
Admission: EM | Admit: 2022-12-02 | Discharge: 2022-12-03 | Disposition: A | Discharge status: Home / Self Care | Payer: 59 | Attending: Emergency Medicine | Admitting: Emergency Medicine

## 2022-12-02 DIAGNOSIS — H5789 Other specified disorders of eye and adnexa: Secondary | ICD-10-CM | POA: Insufficient documentation

## 2022-12-02 DIAGNOSIS — Z8541 Personal history of malignant neoplasm of cervix uteri: Secondary | ICD-10-CM | POA: Insufficient documentation

## 2022-12-02 MED ORDER — TETRACAINE HCL 0.5 % OP SOLN
2.0000 [drp] | Freq: Once | OPHTHALMIC | Status: AC
  Administered 2022-12-03: 2 [drp] via OPHTHALMIC
  Filled 2022-12-02: qty 4

## 2022-12-02 MED ORDER — FLUORESCEIN SODIUM 1 MG OP STRP
1.0000 | ORAL_STRIP | Freq: Once | OPHTHALMIC | Status: DC
  Filled 2022-12-02: qty 1

## 2022-12-02 NOTE — ED Triage Notes (Signed)
Pt via POV c/o bilateral eye scratchiness and drainage for "a long time." She notes that her eyes are crusty in the mornings and she is constantly rubbing at them throughout the day. No visual changes.

## 2022-12-03 MED ORDER — ERYTHROMYCIN 5 MG/GM OP OINT: 1.0000 | TOPICAL_OINTMENT | Freq: Four times a day (QID) | OPHTHALMIC | 0 refills | Status: AC

## 2022-12-03 NOTE — ED Provider Notes (Signed)
[facility] Emergency Department Provider Note MRN:  [id number]  Arrival date & time: 12/03/22     Chief Complaint   Eye Problem   History of Present Illness   Rebecca Osborne is a 72 y.o. year-old female with no pertinent past medical history presenting to the ED with chief complaint of eye problem.  Eye irritation and foreign body sensation in bilateral eyes for the past year.  Was told to see an eye doctor but then she did not.  No significant changes recently, it was just bothering her this evening and she decided to come be evaluated.  No vision loss, no fever, no other complaints.  Review of Systems  A thorough review of systems was obtained and all systems are negative except as noted in the HPI and PMH.   Patient's Health History    Past Medical History:  Diagnosis Date   Allergy    Cancer (HCC)    cervical cancer cells   GERD (gastroesophageal reflux disease)    Osteopenia     Past Surgical History:  Procedure Laterality Date   ABDOMINAL HYSTERECTOMY     BIOPSY  10/16/2019   Procedure: BIOPSY;  Surgeon: Corbin Ade, MD;  [facility];  Service: Endoscopy;;   BOWEL RESECTION N/A 06/22/2016   Procedure: SMALL BOWEL RESECTION;  Surgeon: Ancil Linsey, MD;  Location: [facility];  Service: General;  Laterality: N/A;   CHOLECYSTECTOMY  2005   COLONOSCOPY  08/23/2019   Tortuous left colon, external and internal hemorrhoids   COLONOSCOPY N/A 08/23/2019   Procedure: COLONOSCOPY;  Surgeon: West Bali, MD;  [facility]: [facility];  Service: Endoscopy;  Laterality: N/A;  8:30   COLONOSCOPY WITH ESOPHAGOGASTRODUODENOSCOPY (EGD)  09/2009   Fields: Normal colonoscopy.  H. pylori gastritis.  Schatzki ring status post esophageal dilation.  Next colonoscopy 10 years   ESOPHAGOGASTRODUODENOSCOPY N/A 10/16/2019   Rourk: Erosive reflux esophagitis, gastric erosions, duodenal diverticulum.  Biopsies from the stomach showed mild inflammation.  No H.  pylori.   FRACTURE SURGERY     rt foot   LAPAROTOMY N/A 06/22/2016   Procedure: EXPLORATORY LAPAROTOMY;  Surgeon: Ancil Linsey, MD;  Location: [facility];  Service: General;  Laterality: N/A;   LYSIS OF ADHESION N/A 06/22/2016   Procedure: LYSIS OF ADHESION;  Surgeon: Ancil Linsey, MD;  Location: [facility];  Service: General;  Laterality: N/A;   MALONEY DILATION N/A 10/16/2019   Procedure: Elease Hashimoto DILATION;  Surgeon: Corbin Ade, MD;  Location[facility];  Service: Endoscopy;  Laterality: N/A;    Family History  Problem Relation Age of Onset   COPD Mother    Heart disease Mother    Miscarriages / [place] Mother    Vision loss Mother        from shingles   COPD Father    Cancer Brother        leukemia   Early death Brother    Colon cancer Neg Hx    Colon polyps Neg Hx    Inflammatory bowel disease Neg Hx     Social History   Socioeconomic History   Marital status: Widowed    Spouse name: Not on file   Number of children: 3   Years of education: Not on file   Highest education level: Not on file  Occupational History   Not on file  Tobacco Use   Smoking status: Never   Smokeless tobacco: Never  Vaping Use  Vaping Use: Never used  Substance and Sexual Activity   Alcohol use: No   Drug use: No   Sexual activity: Not Currently  Other Topics Concern   Not on file  Social History Narrative   Husband died in 12-24-10.   Social Determinants of Health   Financial Resource Strain: Low Risk  (06/21/2022)   Overall Financial Resource Strain (CARDIA)    Difficulty of Paying Living Expenses: Not very hard  Food Insecurity: No Food Insecurity (06/21/2022)   Hunger Vital Sign    Worried About Running Out of Food in the Last Year: Never true    Ran Out of Food in the Last Year: Never true  Transportation Needs: Unmet Transportation Needs (06/21/2022)   PRAPARE - Administrator, Civil Service (Medical): Yes    Lack of Transportation (Non-Medical): Yes   Physical Activity: Insufficiently Active (06/21/2022)   Exercise Vital Sign    Days of Exercise per Week: 3 days    Minutes of Exercise per Session: 30 min  Stress: Stress Concern Present (06/21/2022)   [employer] of Occupational Health - Occupational Stress Questionnaire    Feeling of Stress : To some extent  Social Connections: Moderately Integrated (06/21/2022)   Social Connection and Isolation Panel [NHANES]    Frequency of Communication with Friends and Family: More than three times a week    Frequency of Social Gatherings with Friends and Family: Three times a week    Attends Religious Services: More than 4 times per year    Active Member of Clubs or Organizations: Yes    Attends Banker Meetings: More than 4 times per year    Marital Status: Widowed  Intimate Partner Violence: Not At Risk (06/21/2022)   Humiliation, Afraid, Rape, and Kick questionnaire    Fear of Current or Ex-Partner: No    Emotionally Abused: No    Physically Abused: No    Sexually Abused: No     Physical Exam   Vitals:   12/02/22 Dec 23, 2212  BP: (!) 154/87  Pulse: 64  Resp: 15  Temp: 97.7 F (36.5 C)  SpO2: 94%    CONSTITUTIONAL: Well-appearing, NAD NEURO/PSYCH:  Alert and oriented x 3, no focal deficits EYES:  eyes equal and reactive ENT/NECK:  no LAD, no JVD CARDIO: Regular rate, well-perfused, normal S1 and S2 PULM:  CTAB no wheezing or rhonchi GI/GU:  non-distended, non-tender MSK/SPINE:  No gross deformities, no edema SKIN:  no rash, atraumatic   *Additional and/or pertinent findings included in MDM below  Diagnostic and Interventional Summary    EKG Interpretation Date/Time:    Ventricular Rate:    PR Interval:    QRS Duration:    QT Interval:    QTC Calculation:   R Axis:      Text Interpretation:         Labs Reviewed - No data to display  No orders to display    Medications  fluorescein ophthalmic strip 1 strip (has no administration in time range)   tetracaine (PONTOCAINE) 0.5 % ophthalmic solution 2 drop (2 drops Both Eyes Given 12/03/22 0005)     Procedures  /  Critical Care Procedures  ED Course and Medical Decision Making  Initial Impression and Ddx Chronic eye irritation, normal extraocular movements, normal pupil response bilaterally, no significant erythema, normal visual acuity, nothing to suggest an emergent eye issue.  Possibly chronic dry eyes versus a conjunctivitis.  She does say she has crusty discharge in the morning.  Past medical/surgical history that increases complexity of ED encounter: None  Interpretation of Diagnostics Laboratory and/or imaging options to aid in the diagnosis/care of the patient were considered.  After careful history and physical examination, it was determined that there was no indication for diagnostics at this time.  Patient Reassessment and Ultimate Disposition/Management     Discharge  Patient management required discussion with the following services or consulting groups:  None  Complexity of Problems Addressed Acute complicated illness or Injury  Additional Data Reviewed and Analyzed Further history obtained from: None  Additional Factors Impacting ED Encounter Risk Prescriptions  Elmer Sow. Pilar Plate, MD [facility] Emergency Medicine [facility] [email or web address]  Final Clinical Impressions(s) / ED Diagnoses     ICD-10-CM   1. Eye irritation  H57.89       ED Discharge Orders          Ordered    erythromycin ophthalmic ointment  4 times daily        12/03/22 0050             Discharge Instructions Discussed with and Provided to Patient:    Discharge Instructions      You were evaluated in the Emergency Department and after careful evaluation, we did not find any emergent condition requiring admission or further testing in the hospital.  Your exam/testing today is overall reassuring.  Recommend frequent use of eyedrops to keep your eyes  moisturized during the day.  Also recommend using the erythromycin ointment as directed.  Important that you follow-up with an eye doctor.  Please return to the Emergency Department if you experience any worsening of your condition.   Thank you for allowing [place] to be a part of your care.      Sabas Sous, MD 12/03/22 [phone]

## 2022-12-03 NOTE — Discharge Instructions (Signed)
You were evaluated in the Emergency Department and after careful evaluation, we did not find any emergent condition requiring admission or further testing in the hospital.  Your exam/testing today is overall reassuring.  Recommend frequent use of eyedrops to keep your eyes moisturized during the day.  Also recommend using the erythromycin ointment as directed.  Important that you follow-up with an eye doctor.  Please return to the Emergency Department if you experience any worsening of your condition.   Thank you for allowing [place] to be a part of your care.

## 2022-12-05 ENCOUNTER — Telehealth: Payer: Self-pay

## 2022-12-05 NOTE — Transitions of Care (Post Inpatient/ED Visit) (Signed)
   12/05/2022  Name: Rebecca Osborne MRN: [id number] DOB: [birth year]  Today's TOC FU Call Status: Today's TOC FU Call Status:: Successful TOC FU Call Competed TOC FU Call Complete Date: 12/05/22  Transition Care Management Follow-up Telephone Call Date of Discharge: 12/03/22 [facility] Type of Discharge: Emergency Department Reason for ED Visit: Other: (Eye irritation) How have you been since you were released from the hospital?: Better Any questions or concerns?: No  Items Reviewed: Did you receive and understand the discharge instructions provided?: Yes Medications obtained,verified, and reconciled?: Yes (Medications Reviewed) Any new allergies since your discharge?: No Dietary orders reviewed?: Yes Do you have support at home?: Yes  Medications Reviewed Today: Medications Reviewed Today     Reviewed by Merleen Nicely, LPN (Licensed Practical Nurse) on 12/05/22 at 1411  Med List Status: <None>   Medication Order Taking? Sig Documenting Provider Last Dose Status Informant  Calcium Carb-Cholecalciferol (CALCIUM 600+D3 PO) [id number] Yes Take 1 tablet by mouth daily.  [provider] Taking Active Self  Cholecalciferol (VITAMIN D) 50 MCG (2000 UT) tablet [id number] Yes Take 2,000 Units by mouth daily. [provider] Taking Active Self           Med Note Nyoka Cowden   Fri Oct 15, 2021 11:51 AM) d3  erythromycin ophthalmic ointment [id number] Yes Place 1 Application into the left eye 4 (four) times daily for 5 days. Place a 1/2 inch ribbon of ointment into the lower eyelid. Sabas Sous, MD Taking Active   famotidine (PEPCID) 20 MG tablet [id number] Yes TAKE 1 TABLET BY MOUTH EVERY DAY Tiffany Kocher, PA-C Taking Active   HM SUPER VITAMIN B12 2500 MCG CHEW [id number] Yes Chew 2,500 mcg by mouth daily. [provider] Taking Active   ibuprofen (ADVIL) 200 MG tablet [id number] Yes Take 200-400 mg by mouth every 6 (six) hours as  needed (for pain.). [provider] Taking Active Self            Home Care and Equipment/Supplies: Were Home Health Services Ordered?: NA Any new equipment or medical supplies ordered?: NA  Functional Questionnaire: Do you need assistance with bathing/showering or dressing?: No Do you need assistance with meal preparation?: No Do you need assistance with eating?: No Do you have difficulty maintaining continence: No Do you need assistance with getting out of bed/getting out of a chair/moving?: No Do you have difficulty managing or taking your medications?: No  Follow up appointments reviewed: PCP Follow-up appointment confirmed?: No MD Provider Line [phone] Given: Yes Specialist Hospital Follow-up appointment confirmed?: No Follow-Up Specialty Provider:: [facility] Specialist Follow-Up Not Confirmed: Patient has Specialist Provider Number and will Call for Appointment Do you need transportation to your follow-up appointment?: No Do you understand care options if your condition(s) worsen?: Yes-patient verbalized understanding    SIGNATURE  Woodfin Ganja LPN [facility] Nurse Health Advisor Direct Dial [phone]

## 2022-12-09 ENCOUNTER — Other Ambulatory Visit: Payer: Self-pay | Admitting: Gastroenterology

## 2022-12-12 NOTE — Telephone Encounter (Signed)
Needs OV.  

## 2022-12-14 DIAGNOSIS — H04123 Dry eye syndrome of bilateral lacrimal glands: Secondary | ICD-10-CM | POA: Diagnosis not present

## 2022-12-14 DIAGNOSIS — H1045 Other chronic allergic conjunctivitis: Secondary | ICD-10-CM | POA: Diagnosis not present

## 2022-12-27 ENCOUNTER — Ambulatory Visit (INDEPENDENT_AMBULATORY_CARE_PROVIDER_SITE_OTHER): Payer: 59 | Admitting: Gastroenterology

## 2022-12-27 ENCOUNTER — Encounter (INDEPENDENT_AMBULATORY_CARE_PROVIDER_SITE_OTHER): Payer: Self-pay

## 2022-12-27 ENCOUNTER — Encounter: Payer: Self-pay | Admitting: Gastroenterology

## 2022-12-27 VITALS — BP 105/69 | HR 80 | Temp 97.0°F | Ht 61.0 in | Wt 138.0 lb

## 2022-12-27 DIAGNOSIS — R1030 Lower abdominal pain, unspecified: Secondary | ICD-10-CM | POA: Diagnosis not present

## 2022-12-27 DIAGNOSIS — K219 Gastro-esophageal reflux disease without esophagitis: Secondary | ICD-10-CM | POA: Diagnosis not present

## 2022-12-27 MED ORDER — FAMOTIDINE 20 MG PO TABS: 20.0000 mg | ORAL_TABLET | Freq: Every day | ORAL | 3 refills | Status: AC

## 2022-12-27 NOTE — Patient Instructions (Addendum)
Continue famotidine 20mg  daily.  We will be in touch regarding any potential needs for updating CT scan or colonoscopy.  Return office visit in two years.

## 2022-12-27 NOTE — Progress Notes (Signed)
GI Office Note    Referring Provider: Donita Brooks, MD Primary Care Physician:  Donita Brooks, MD  Primary Gastroenterologist: Hennie Duos. Marletta Lor, DO   Chief Complaint   Chief Complaint  Patient presents with   Follow-up    GERD. Pt has been out of pepcid.    History of Present Illness   Rebecca Osborne is a 72 y.o. female presenting today for refills. Last seen in 2021. H/o erosive reflux esophagitis.   Ran out of famotidine awhile ago. She believes she has been off for about a year. Has been having frequent daily heartburn, takes 4-5 TUMS at a time. No n/v. No dysphagia. BM regular. No melena, brbpr. She has some intermittent lower abdominal pain, right side greater than left. Often worse with twisting motions, like wiping after a BM. She is also concerned about "polyps" she feels around her anus.   PPI stopped while in hospital in 2020 with Cdiff.   CT abdomen pelvis with contrast July 15, 2020: Mild wall thickening within the cecum, less pronounced than on prior study in November 2020.  Mild pericecal fat stranding with subcentimeter mesenteric lymph nodes likely reactive.  Normal appendix right lower quadrant.  EGD 10/2019: -erosive reflux esophagitis s/p dilation -hiatal hernia -gastric erosions s/p biopsy, mild nonspecific reactive gastropathy, no h.pylori -duodenal diverticulum  Colonoscopy 07/2019: -tortuous left colon -Ext/int hemorrhoids -consider colonoscopy in 10 years  Medications   Current Outpatient Medications  Medication Sig Dispense Refill   Cholecalciferol (VITAMIN D) 50 MCG (2000 UT) tablet Take 2,000 Units by mouth daily.     ibuprofen (ADVIL) 200 MG tablet Take 200-400 mg by mouth every 6 (six) hours as needed (for pain.).     famotidine (PEPCID) 20 MG tablet TAKE 1 TABLET BY MOUTH EVERY DAY (Patient not taking: Reported on 12/27/2022) 90 tablet 0   No current facility-administered medications for this visit.    Allergies    Allergies as of 12/27/2022 - Review Complete 12/27/2022  Allergen Reaction Noted   Codeine Itching      Past Medical History   Past Medical History:  Diagnosis Date   Allergy    Cancer (HCC)    cervical cancer cells   GERD (gastroesophageal reflux disease)    Osteopenia     Past Surgical History   Past Surgical History:  Procedure Laterality Date   ABDOMINAL HYSTERECTOMY     BIOPSY  10/16/2019   Procedure: BIOPSY;  Surgeon: Corbin Ade, MD;  [facility];  Service: Endoscopy;;   BOWEL RESECTION N/A 06/22/2016   Procedure: SMALL BOWEL RESECTION;  Surgeon: Ancil Linsey, MD;  Location: [facility];  Service: General;  Laterality: N/A;   CHOLECYSTECTOMY  2005   COLONOSCOPY  08/23/2019   Tortuous left colon, external and internal hemorrhoids   COLONOSCOPY N/A 08/23/2019   Procedure: COLONOSCOPY;  Surgeon: West Bali, MD;  [facility];  Service: Endoscopy;  Laterality: N/A;  8:30   COLONOSCOPY WITH ESOPHAGOGASTRODUODENOSCOPY (EGD)  09/2009   Fields: Normal colonoscopy.  H. pylori gastritis.  Schatzki ring status post esophageal dilation.  Next colonoscopy 10 years   ESOPHAGOGASTRODUODENOSCOPY N/A 10/16/2019   Rourk: Erosive reflux esophagitis, gastric erosions, duodenal diverticulum.  Biopsies from the stomach showed mild inflammation.  No H. pylori.   FRACTURE SURGERY     rt foot   LAPAROTOMY N/A 06/22/2016   Procedure: EXPLORATORY LAPAROTOMY;  Surgeon: Ancil Linsey, MD;  Location: [facility];  Service:  General;  Laterality: N/A;   LYSIS OF ADHESION N/A 06/22/2016   Procedure: LYSIS OF ADHESION;  Surgeon: Ancil Linsey, MD;  Location: [facility];  Service: General;  Laterality: N/A;   MALONEY DILATION N/A 10/16/2019   Procedure: Elease Hashimoto DILATION;  Surgeon: Corbin Ade, MD;  [facility];  Service: Endoscopy;  Laterality: N/A;    Past Family History   Family History  Problem Relation Age of Onset   COPD Mother    Heart disease  Mother    Miscarriages / [place] Mother    Vision loss Mother        from shingles   COPD Father    Cancer Brother        leukemia   Early death Brother    Colon cancer Neg Hx    Colon polyps Neg Hx    Inflammatory bowel disease Neg Hx     Past Social History   Social History   Socioeconomic History   Marital status: Widowed    Spouse name: Not on file   Number of children: 3   Years of education: Not on file   Highest education level: Not on file  Occupational History   Not on file  Tobacco Use   Smoking status: Never   Smokeless tobacco: Never  Vaping Use   Vaping status: Never Used  Substance and Sexual Activity   Alcohol use: No   Drug use: No   Sexual activity: Not Currently  Other Topics Concern   Not on file  Social History Narrative   Husband died in 02-05-11.   Social Determinants of Health   Financial Resource Strain: Low Risk  (06/21/2022)   Overall Financial Resource Strain (CARDIA)    Difficulty of Paying Living Expenses: Not very hard  Food Insecurity: No Food Insecurity (06/21/2022)   Hunger Vital Sign    Worried About Running Out of Food in the Last Year: Never true    Ran Out of Food in the Last Year: Never true  Transportation Needs: Unmet Transportation Needs (06/21/2022)   PRAPARE - Administrator, Civil Service (Medical): Yes    Lack of Transportation (Non-Medical): Yes  Physical Activity: Insufficiently Active (06/21/2022)   Exercise Vital Sign    Days of Exercise per Week: 3 days    Minutes of Exercise per Session: 30 min  Stress: Stress Concern Present (06/21/2022)   [employer] of Occupational Health - Occupational Stress Questionnaire    Feeling of Stress : To some extent  Social Connections: Moderately Integrated (06/21/2022)   Social Connection and Isolation Panel [NHANES]    Frequency of Communication with Friends and Family: More than three times a week    Frequency of Social Gatherings with Friends and Family:  Three times a week    Attends Religious Services: More than 4 times per year    Active Member of Clubs or Organizations: Yes    Attends Banker Meetings: More than 4 times per year    Marital Status: Widowed  Intimate Partner Violence: Not At Risk (06/21/2022)   Humiliation, Afraid, Rape, and Kick questionnaire    Fear of Current or Ex-Partner: No    Emotionally Abused: No    Physically Abused: No    Sexually Abused: No    Review of Systems   General: Negative for anorexia, weight loss, fever, chills, fatigue, weakness. ENT: Negative for hoarseness, difficulty swallowing , nasal congestion. CV: Negative for chest pain, angina, palpitations, dyspnea on  exertion, peripheral edema.  Respiratory: Negative for dyspnea at rest, dyspnea on exertion, cough, sputum, wheezing.  GI: See history of present illness. GU:  Negative for dysuria, hematuria, urinary incontinence, urinary frequency, nocturnal urination.  Endo: Negative for unusual weight change.     Physical Exam   BP 105/69 (BP Location: Right Arm, Patient Position: Sitting, Cuff Size: Normal)   Pulse 80   Temp (!) 97 F (36.1 C) (Temporal)   Ht 5\' 1"  (1.549 m)   Wt 138 lb (62.6 kg)   SpO2 99%   BMI 26.07 kg/m    General: Well-nourished, well-developed in no acute distress.  Eyes: No icterus. Mouth: Oropharyngeal mucosa moist and pink , no lesions erythema or exudate. Lungs: Clear to auscultation bilaterally.  Heart: Regular rate and rhythm, no murmurs rubs or gallops.  Abdomen: Bowel sounds are normal, nontender, nondistended, no hepatosplenomegaly or masses,  no abdominal bruits or hernia , no rebound or guarding.  Rectal: nonthrombosed/nonbleeding external hemorrhoids noted, Internal hemorrhoid palpated. No masses. Brown stool heme negative.  Extremities: No lower extremity edema. No clubbing or deformities. Neuro: Alert and oriented x 4   Skin: Warm and dry, no jaundice.   Psych: Alert and cooperative,  normal mood and affect.  Labs    No recent labs.   Imaging Studies   No results found.  Assessment   *GERD *Lower abdominal pain   Recent flare of reflux off of medication.  No alarm symptoms.  Will restart medication.  Reinforced antireflux measures.  Chronic intermittent lower abdominal pain, right side greater than left.  Often worsened with movement/twisting, almost like a cramp.  Interestingly she has had several CT scans with abnormal colon although at least once in the setting of C. difficile colitis.  Her colonoscopy was completed in February 2021 which was unremarkable.  CT in February 2022 with mild cecal wall thickening and inflammatory changes consistent with colitis, less pronounced than prior study April 29, 2019.  She was not having symptoms of colitis at that time.  To discuss further with Dr. Marletta Lor regarding if any additional imaging via CT or updated colonoscopy needed.  PLAN   Continue famotidine 20 mg daily. To discuss further with Dr. Marletta Lor regarding any potential need for further imaging of the right colon. Otherwise return office visit in 2 years.   Leanna Battles. Melvyn Neth, MHS, PA-C [facility]

## 2023-01-11 ENCOUNTER — Telehealth: Payer: Self-pay | Admitting: Gastroenterology

## 2023-01-11 ENCOUNTER — Other Ambulatory Visit: Payer: Self-pay | Admitting: *Deleted

## 2023-01-11 DIAGNOSIS — R1030 Lower abdominal pain, unspecified: Secondary | ICD-10-CM

## 2023-01-11 DIAGNOSIS — R9389 Abnormal findings on diagnostic imaging of other specified body structures: Secondary | ICD-10-CM

## 2023-01-11 NOTE — Telephone Encounter (Signed)
Please arrange for CT A/P with IV and oral contrast for Dx: rlq and llq pain. H/o abnormal cecum on CT

## 2023-01-11 NOTE — Telephone Encounter (Signed)
Tammy, please let pt know I spoke to Dr. Marletta Lor about her chronic intermittent lower abdominal pain and prior abnormal colon on CTs. He suggested consider repeat CT scan and then if colon still abnormal, proceed with colonoscopy. Other option is updated colonoscopy first. Let me know how she wants to proceed.

## 2023-01-11 NOTE — Telephone Encounter (Signed)
Pt informed that she needs to have lab drawn prior to CT on 01/18/23. BMET ordered. Pt verbalized understanding.

## 2023-01-11 NOTE — Telephone Encounter (Signed)
Pt is wanting to have a CT done first.

## 2023-01-11 NOTE — Telephone Encounter (Signed)
[employer] PA for CT: CPT Code 04540 Description: CT ABDOMEN & PELVIS W/ Case Number: [id number] Review Date: 01/11/2023 12:35:35 PM Expiration Date: N/A Status: This member's benefit plan did not require a prior authorization for this request.

## 2023-01-11 NOTE — Telephone Encounter (Signed)
Pt informed CT is scheduled for 01/18/23, arrive at 12:15 pm to check in, will start drinking oral contrast at 12:30 pm, scan will be at 2:30 pm. Verbalized understanding.

## 2023-01-13 ENCOUNTER — Other Ambulatory Visit (HOSPITAL_COMMUNITY)
Admission: RE | Admit: 2023-01-13 | Discharge: 2023-01-13 | Disposition: A | Discharge status: Home / Self Care | Payer: 59 | Source: Ambulatory Visit | Attending: Gastroenterology | Admitting: Gastroenterology

## 2023-01-13 DIAGNOSIS — R9389 Abnormal findings on diagnostic imaging of other specified body structures: Secondary | ICD-10-CM | POA: Diagnosis not present

## 2023-01-13 DIAGNOSIS — R1030 Lower abdominal pain, unspecified: Secondary | ICD-10-CM | POA: Diagnosis not present

## 2023-01-13 LAB — BASIC METABOLIC PANEL
Anion gap: 8 (ref 5–15)
BUN: 20 mg/dL (ref 8–23)
CO2: 24 mmol/L (ref 22–32)
Calcium: 9.2 mg/dL (ref 8.9–10.3)
Chloride: 105 mmol/L (ref 98–111)
Creatinine, Ser: 1.1 mg/dL — ABNORMAL HIGH (ref 0.44–1.00)
GFR, Estimated: 53 mL/min — ABNORMAL LOW (ref >60)
Glucose, Bld: 103 mg/dL — ABNORMAL HIGH (ref 70–99)
Potassium: 3.7 mmol/L (ref 3.5–5.1)
Sodium: 137 mmol/L (ref 135–145)

## 2023-01-16 ENCOUNTER — Other Ambulatory Visit: Payer: Self-pay

## 2023-01-16 DIAGNOSIS — R7989 Other specified abnormal findings of blood chemistry: Secondary | ICD-10-CM

## 2023-01-18 ENCOUNTER — Ambulatory Visit (HOSPITAL_COMMUNITY)
Admission: RE | Admit: 2023-01-18 | Discharge: 2023-01-18 | Disposition: A | Discharge status: Home / Self Care | Payer: 59 | Source: Ambulatory Visit | Attending: Gastroenterology | Admitting: Gastroenterology

## 2023-01-18 DIAGNOSIS — K449 Diaphragmatic hernia without obstruction or gangrene: Secondary | ICD-10-CM | POA: Diagnosis not present

## 2023-01-18 DIAGNOSIS — N8110 Cystocele, unspecified: Secondary | ICD-10-CM | POA: Diagnosis not present

## 2023-01-18 DIAGNOSIS — R9389 Abnormal findings on diagnostic imaging of other specified body structures: Secondary | ICD-10-CM | POA: Diagnosis not present

## 2023-01-18 DIAGNOSIS — N289 Disorder of kidney and ureter, unspecified: Secondary | ICD-10-CM | POA: Diagnosis not present

## 2023-01-18 DIAGNOSIS — R1030 Lower abdominal pain, unspecified: Secondary | ICD-10-CM | POA: Diagnosis not present

## 2023-01-18 DIAGNOSIS — R109 Unspecified abdominal pain: Secondary | ICD-10-CM | POA: Diagnosis not present

## 2023-01-18 MED ORDER — IOHEXOL 9 MG/ML PO SOLN
ORAL | Status: AC
  Filled 2023-01-18: qty 500

## 2023-01-18 MED ORDER — IOHEXOL 300 MG/ML  SOLN
80.0000 mL | Freq: Once | INTRAMUSCULAR | Status: AC | PRN
  Administered 2023-01-18: 80 mL via INTRAVENOUS

## 2023-01-30 ENCOUNTER — Other Ambulatory Visit (HOSPITAL_COMMUNITY)
Admission: RE | Admit: 2023-01-30 | Discharge: 2023-01-30 | Disposition: A | Discharge status: Home / Self Care | Payer: 59 | Source: Ambulatory Visit | Attending: Gastroenterology | Admitting: Gastroenterology

## 2023-01-30 DIAGNOSIS — R7989 Other specified abnormal findings of blood chemistry: Secondary | ICD-10-CM | POA: Diagnosis not present

## 2023-01-30 LAB — BASIC METABOLIC PANEL
Anion gap: 7 (ref 5–15)
BUN: 11 mg/dL (ref 8–23)
CO2: 25 mmol/L (ref 22–32)
Calcium: 9.1 mg/dL (ref 8.9–10.3)
Chloride: 104 mmol/L (ref 98–111)
Creatinine, Ser: 0.91 mg/dL (ref 0.44–1.00)
GFR, Estimated: >60 mL/min (ref >60)
Glucose, Bld: 90 mg/dL (ref 70–99)
Potassium: 3.9 mmol/L (ref 3.5–5.1)
Sodium: 136 mmol/L (ref 135–145)

## 2023-02-02 ENCOUNTER — Ambulatory Visit (INDEPENDENT_AMBULATORY_CARE_PROVIDER_SITE_OTHER): Payer: 59 | Admitting: Family Medicine

## 2023-02-02 VITALS — BP 138/88 | HR 78 | Temp 97.8°F | Ht 61.0 in | Wt 133.0 lb

## 2023-02-02 DIAGNOSIS — I517 Cardiomegaly: Secondary | ICD-10-CM

## 2023-02-02 NOTE — Progress Notes (Signed)
Subjective:    Patient ID: Rebecca Osborne, female    DOB: 1951-04-08, 72 y.o.   MRN: [id number]  Patient is a 72 year old Caucasian female who presents today because she was told that she has an enlarged heart.  She is currently seeing GI.  As part of their workup they obtained a CT scan of the abdomen and pelvis them and Impella there was mention of cardiomegaly.  Therefore she is here today to follow-up to discuss this.  She denies any chest pain.  She denies any shortness of breath.  Denies any dyspnea on exertion.  She denies any orthopnea.  She has no family history of hypertrophic or myopathy or congenital heart defects.  She has no family history of premature cardiovascular disease or sudden cardiac death.  She has no history of sarcoidosis.  On exam today she does have a faint systolic ejection murmur heard best over the aortic valve but otherwise her exam is normal.  She is completely asymptomatic. Past Medical History:  Diagnosis Date   Allergy    Cancer (HCC)    cervical cancer cells   GERD (gastroesophageal reflux disease)    Osteopenia    Past Surgical History:  Procedure Laterality Date   ABDOMINAL HYSTERECTOMY     BIOPSY  10/16/2019   Procedure: BIOPSY;  Surgeon: Corbin Ade, MD;  [facility];  Service: Endoscopy;;   BOWEL RESECTION N/A 06/22/2016   Procedure: SMALL BOWEL RESECTION;  Surgeon: Ancil Linsey, MD;  [facility]: [facility];  Service: General;  Laterality: N/A;   CHOLECYSTECTOMY  2005   COLONOSCOPY  08/23/2019   Tortuous left colon, external and internal hemorrhoids   COLONOSCOPY N/A 08/23/2019   Procedure: COLONOSCOPY;  Surgeon: West Bali, MD;  [facility];  Service: Endoscopy;  Laterality: N/A;  8:30   COLONOSCOPY WITH ESOPHAGOGASTRODUODENOSCOPY (EGD)  09/2009   Fields: Normal colonoscopy.  H. pylori gastritis.  Schatzki ring status post esophageal dilation.  Next colonoscopy 10 years   ESOPHAGOGASTRODUODENOSCOPY N/A 10/16/2019    Rourk: Erosive reflux esophagitis, gastric erosions, duodenal diverticulum.  Biopsies from the stomach showed mild inflammation.  No H. pylori.   FRACTURE SURGERY     rt foot   LAPAROTOMY N/A 06/22/2016   Procedure: EXPLORATORY LAPAROTOMY;  Surgeon: Ancil Linsey, MD;  Location: [facility];  Service: General;  Laterality: N/A;   LYSIS OF ADHESION N/A 06/22/2016   Procedure: LYSIS OF ADHESION;  Surgeon: Ancil Linsey, MD;  [facility];  Service: General;  Laterality: N/A;   MALONEY DILATION N/A 10/16/2019   Procedure: Elease Hashimoto DILATION;  Surgeon: Corbin Ade, MD;  [facility];  Service: Endoscopy;  Laterality: N/A;   Current Outpatient Medications on File Prior to Visit  Medication Sig Dispense Refill   Cholecalciferol (VITAMIN D) 50 MCG (2000 UT) tablet Take 2,000 Units by mouth daily.     famotidine (PEPCID) 20 MG tablet Take 1 tablet (20 mg total) by mouth daily. 90 tablet 3   ibuprofen (ADVIL) 200 MG tablet Take 200-400 mg by mouth every 6 (six) hours as needed (for pain.).     No current facility-administered medications on file prior to visit.   Allergies  Allergen Reactions   Codeine Itching   Social History   Socioeconomic History   Marital status: Widowed    Spouse name: Not on file   Number of children: 3   Years of education: Not on file   Highest education level:  Not on file  Occupational History   Not on file  Tobacco Use   Smoking status: Never   Smokeless tobacco: Never  Vaping Use   Vaping status: Never Used  Substance and Sexual Activity   Alcohol use: No   Drug use: No   Sexual activity: Not Currently  Other Topics Concern   Not on file  Social History Narrative   Husband died in 02-15-11.   Social Determinants of Health   Financial Resource Strain: Low Risk  (06/21/2022)   Overall Financial Resource Strain (CARDIA)    Difficulty of Paying Living Expenses: Not very hard  Food Insecurity: No Food Insecurity (06/21/2022)   Hunger Vital  Sign    Worried About Running Out of Food in the Last Year: Never true    Ran Out of Food in the Last Year: Never true  Transportation Needs: Unmet Transportation Needs (06/21/2022)   PRAPARE - Administrator, Civil Service (Medical): Yes    Lack of Transportation (Non-Medical): Yes  Physical Activity: Insufficiently Active (06/21/2022)   Exercise Vital Sign    Days of Exercise per Week: 3 days    Minutes of Exercise per Session: 30 min  Stress: Stress Concern Present (06/21/2022)   [employer] of Occupational Health - Occupational Stress Questionnaire    Feeling of Stress : To some extent  Social Connections: Moderately Integrated (06/21/2022)   Social Connection and Isolation Panel [NHANES]    Frequency of Communication with Friends and Family: More than three times a week    Frequency of Social Gatherings with Friends and Family: Three times a week    Attends Religious Services: More than 4 times per year    Active Member of Clubs or Organizations: Yes    Attends Banker Meetings: More than 4 times per year    Marital Status: Widowed  Intimate Partner Violence: Not At Risk (06/21/2022)   Humiliation, Afraid, Rape, and Kick questionnaire    Fear of Current or Ex-Partner: No    Emotionally Abused: No    Physically Abused: No    Sexually Abused: No      Review of Systems  All other systems reviewed and are negative.      Objective:   Physical Exam Vitals reviewed.  Constitutional:      General: She is not in acute distress.    Appearance: Normal appearance. She is not ill-appearing or toxic-appearing.  Cardiovascular:     Rate and Rhythm: Normal rate and regular rhythm. No extrasystoles are present.    Chest Wall: PMI is not displaced. No thrill.     Pulses: Normal pulses.     Heart sounds: Murmur heard.     Systolic murmur is present with a grade of 1/6.     No friction rub.  Pulmonary:     Effort: Pulmonary effort is normal. No respiratory  distress.     Breath sounds: No stridor. No wheezing, rhonchi or rales.  Abdominal:     General: Abdomen is flat. Bowel sounds are normal.     Palpations: Abdomen is soft.     Tenderness: There is no abdominal tenderness. There is no rebound.  Musculoskeletal:        General: Deformity present. No swelling.     Right lower leg: No edema.     Left lower leg: No edema.  Lymphadenopathy:     Cervical: No cervical adenopathy.  Skin:    Findings: No bruising, erythema, lesion or rash.  Neurological:     Mental Status: She is alert.           Assessment & Plan:  Cardiomegaly - Plan: ECHOCARDIOGRAM COMPLETE This is a coincidental finding in a patient who is asymptomatic.  I reassured the patient that I do not feel that there is a significant underlying problem.  I will get an echocardiogram to evaluate for any evidence of congestive heart failure, valvular disease, restrictive cardiomyopathy, or diastolic dysfunction.  If the echocardiogram is normal, no further treatment is necessary.  Patient is completely asymptomatic

## 2023-03-10 ENCOUNTER — Ambulatory Visit (HOSPITAL_COMMUNITY)
Admission: RE | Admit: 2023-03-10 | Discharge: 2023-03-10 | Disposition: A | Discharge status: Home / Self Care | Payer: 59 | Source: Ambulatory Visit | Attending: Family Medicine | Admitting: Family Medicine

## 2023-03-10 DIAGNOSIS — I517 Cardiomegaly: Secondary | ICD-10-CM

## 2023-03-10 LAB — ECHOCARDIOGRAM COMPLETE
AR max vel: 1.25 cm2
AV Area VTI: 1.29 cm2
AV Area mean vel: 1.31 cm2
AV Mean grad: 8 mm[Hg]
AV Peak grad: 14.9 mm[Hg]
Ao pk vel: 1.93 m/s
Area-P 1/2: 3.17 cm2
S' Lateral: 2.3 cm

## 2023-03-10 NOTE — Progress Notes (Signed)
*  PRELIMINARY RESULTS* Echocardiogram 2D Echocardiogram has been performed.  Stacey Drain 03/10/2023, 10:58 AM

## 2023-04-13 NOTE — Addendum Note (Signed)
Encounter addended by: Linnell Fulling on: 04/13/2023 9:16 AM  Actions taken: Imaging Exam ended

## 2023-05-22 ENCOUNTER — Encounter: Payer: Self-pay | Admitting: Family Medicine

## 2023-05-22 ENCOUNTER — Ambulatory Visit (INDEPENDENT_AMBULATORY_CARE_PROVIDER_SITE_OTHER): Payer: 59 | Admitting: Family Medicine

## 2023-05-22 VITALS — BP 128/82 | HR 68 | Temp 98.0°F | Ht 61.0 in | Wt 136.0 lb

## 2023-05-22 DIAGNOSIS — J069 Acute upper respiratory infection, unspecified: Secondary | ICD-10-CM | POA: Insufficient documentation

## 2023-05-22 NOTE — Patient Instructions (Signed)
Your symptoms and exam findings are most consistent with a viral upper respiratory infection. These usually run their course in 5-7 days. Unfortunately, antibiotics don't work against viruses and just increase your risk of other issues such as diarrhea, yeast infections, and resistant infections.  If your symptoms last longer than 10 days and/or you start feeling worse with facial pain, high fever, cough, shortness of breath or start feeling significantly worse, please call us right away to be further evaluated.  Some things that can make you feel better are: - Increased rest - Increasing fluid with water/sugar free electrolytes - Acetaminophen as needed for fever/pain.  - Salt water gargling, chloraseptic spray and throat lozenges - OTC guaifenesin (Mucinex).  - Saline sinus flushes or a neti pot.  - Humidifying the air.  

## 2023-05-22 NOTE — Progress Notes (Signed)
Subjective:  HPI: Rebecca Osborne is a 72 y.o. female presenting on 05/22/2023 for Follow-up (Cold/Flu symptoms/)   HPI Patient is in today for 4 days of fatigue, mucopurulent cough, clear rhinorrhea. Her symptoms are much improved today. She also endorses pain with coughing and postnasal drip. No known sick exposures.  Denies SOB, wheezing, fever, chills, body aches, sore throat, ear pain, sinus pressure. Has tried nothing.   Review of Systems  All other systems reviewed and are negative.   Relevant past medical history reviewed and updated as indicated.   Past Medical History:  Diagnosis Date   Allergy    Cancer (HCC)    cervical cancer cells   GERD (gastroesophageal reflux disease)    Osteopenia      Past Surgical History:  Procedure Laterality Date   ABDOMINAL HYSTERECTOMY     BIOPSY  10/16/2019   Procedure: BIOPSY;  Surgeon: Corbin Ade, MD;  [facility];  Service: Endoscopy;;   BOWEL RESECTION N/A 06/22/2016   Procedure: SMALL BOWEL RESECTION;  Surgeon: Ancil Linsey, MD;  [facility];  Service: General;  Laterality: N/A;   CHOLECYSTECTOMY  2005   COLONOSCOPY  08/23/2019   Tortuous left colon, external and internal hemorrhoids   COLONOSCOPY N/A 08/23/2019   Procedure: COLONOSCOPY;  Surgeon: West Bali, MD;  [facility];  Service: Endoscopy;  Laterality: N/A;  8:30   COLONOSCOPY WITH ESOPHAGOGASTRODUODENOSCOPY (EGD)  09/2009   Fields: Normal colonoscopy.  H. pylori gastritis.  Schatzki ring status post esophageal dilation.  Next colonoscopy 10 years   ESOPHAGOGASTRODUODENOSCOPY N/A 10/16/2019   Rourk: Erosive reflux esophagitis, gastric erosions, duodenal diverticulum.  Biopsies from the stomach showed mild inflammation.  No H. pylori.   FRACTURE SURGERY     rt foot   LAPAROTOMY N/A 06/22/2016   Procedure: EXPLORATORY LAPAROTOMY;  Surgeon: Ancil Linsey, MD;  [facility]: [facility];  Service: General;  Laterality: N/A;   LYSIS OF  ADHESION N/A 06/22/2016   Procedure: LYSIS OF ADHESION;  Surgeon: Ancil Linsey, MD;  Location: [facility];  Service: General;  Laterality: N/A;   MALONEY DILATION N/A 10/16/2019   Procedure: Elease Hashimoto DILATION;  Surgeon: Corbin Ade, MD;  [facility];  Service: Endoscopy;  Laterality: N/A;    Allergies and medications reviewed and updated.   Current Outpatient Medications:    Cholecalciferol (VITAMIN D) 50 MCG (2000 UT) tablet, Take 2,000 Units by mouth daily., Disp: , Rfl:    famotidine (PEPCID) 20 MG tablet, Take 1 tablet (20 mg total) by mouth daily., Disp: 90 tablet, Rfl: 3   ibuprofen (ADVIL) 200 MG tablet, Take 200-400 mg by mouth every 6 (six) hours as needed (for pain.)., Disp: , Rfl:   Allergies  Allergen Reactions   Codeine Itching    Objective:   BP 128/82   Pulse 68   Temp 98 F (36.7 C) (Oral)   Ht 5\' 1"  (1.549 m)   Wt 136 lb (61.7 kg)   SpO2 96%   BMI 25.70 kg/m      05/22/2023    3:29 PM 02/02/2023    3:49 PM 12/27/2022    2:42 PM  Vitals with BMI  Height 5\' 1"  5\' 1"  5\' 1"   Weight 136 lbs 133 lbs 138 lbs  BMI 25.71 25.14 26.09  Systolic 128 138 244  Diastolic 82 88 69  Pulse 68 78 80     Physical Exam Vitals and nursing note reviewed.  Constitutional:      Appearance: Normal appearance. She is normal weight.  HENT:     Head: Normocephalic and atraumatic.     Right Ear: Tympanic membrane, ear canal and external ear normal.     Left Ear: Tympanic membrane, ear canal and external ear normal.     Nose: Congestion present.     Mouth/Throat:     Mouth: Mucous membranes are moist.     Pharynx: Oropharynx is clear.  Eyes:     Conjunctiva/sclera: Conjunctivae normal.  Cardiovascular:     Rate and Rhythm: Normal rate and regular rhythm.     Pulses: Normal pulses.     Heart sounds: Normal heart sounds.  Pulmonary:     Effort: Pulmonary effort is normal.     Breath sounds: Normal breath sounds.  Musculoskeletal:     Cervical back: No  tenderness.  Lymphadenopathy:     Cervical: No cervical adenopathy.  Skin:    General: Skin is warm and dry.  Neurological:     General: No focal deficit present.     Mental Status: She is alert and oriented to person, place, and time. Mental status is at baseline.  Psychiatric:        Mood and Affect: Mood normal.        Behavior: Behavior normal.        Thought Content: Thought content normal.        Judgment: Judgment normal.     Assessment & Plan:  Viral URI with cough Assessment & Plan: Reassured patient that symptoms and exam findings are most consistent with a viral upper respiratory infection and explained lack of efficacy of antibiotics against viruses.  Discussed expected course and features suggestive of secondary bacterial infection.  Continue supportive care. Increase fluid intake with water or electrolyte solution like pedialyte. Encouraged acetaminophen as needed for fever/pain. Encouraged salt water gargling, chloraseptic spray and throat lozenges. Encouraged OTC guaifenesin. Encouraged saline sinus flushes and/or neti with humidified air.  Medications are cost prohibitive for her. She declines rx at this time. Instructed to return to office if symptoms persist or worsen.      Follow up plan: Return if symptoms worsen or fail to improve.  Park Meo, FNP

## 2023-05-22 NOTE — Assessment & Plan Note (Signed)
Reassured patient that symptoms and exam findings are most consistent with a viral upper respiratory infection and explained lack of efficacy of antibiotics against viruses.  Discussed expected course and features suggestive of secondary bacterial infection.  Continue supportive care. Increase fluid intake with water or electrolyte solution like pedialyte. Encouraged acetaminophen as needed for fever/pain. Encouraged salt water gargling, chloraseptic spray and throat lozenges. Encouraged OTC guaifenesin. Encouraged saline sinus flushes and/or neti with humidified air.  Medications are cost prohibitive for her. She declines rx at this time. Instructed to return to office if symptoms persist or worsen.

## 2023-06-05 ENCOUNTER — Ambulatory Visit (INDEPENDENT_AMBULATORY_CARE_PROVIDER_SITE_OTHER): Payer: 59 | Admitting: Family Medicine

## 2023-06-05 ENCOUNTER — Encounter: Payer: Self-pay | Admitting: Family Medicine

## 2023-06-05 VITALS — BP 130/78 | HR 86 | Temp 98.9°F | Ht 61.0 in | Wt 137.0 lb

## 2023-06-05 DIAGNOSIS — J069 Acute upper respiratory infection, unspecified: Secondary | ICD-10-CM | POA: Diagnosis not present

## 2023-06-05 LAB — INFLUENZA A AND B AG, IMMUNOASSAY
INFLUENZA A ANTIGEN: NOT DETECTED
INFLUENZA B ANTIGEN: NOT DETECTED

## 2023-06-05 NOTE — Progress Notes (Signed)
Subjective:  HPI: Rebecca Osborne is a 72 y.o. female presenting on 06/05/2023 for Acute Visit (runny nose, cough, feels unwell)   HPI Patient is in today for mucopurulent cough, headache, sinus pressure and clear rhinorrhea for 2 days. No known sick contacts, no home covid test. Denies fever, chills, body aches, shortness of breath, wheeze, or pleurisy. Has tried Tylenol.   Review of Systems  All other systems reviewed and are negative.   Relevant past medical history reviewed and updated as indicated.   Past Medical History:  Diagnosis Date   Allergy    Cancer (HCC)    cervical cancer cells   GERD (gastroesophageal reflux disease)    Osteopenia      Past Surgical History:  Procedure Laterality Date   ABDOMINAL HYSTERECTOMY     BIOPSY  10/16/2019   Procedure: BIOPSY;  Surgeon: Corbin Ade, MD;  [facility];  Service: Endoscopy;;   BOWEL RESECTION N/A 06/22/2016   Procedure: SMALL BOWEL RESECTION;  Surgeon: Ancil Linsey, MD;  Location: [facility];  Service: General;  Laterality: N/A;   CHOLECYSTECTOMY  2005   COLONOSCOPY  08/23/2019   Tortuous left colon, external and internal hemorrhoids   COLONOSCOPY N/A 08/23/2019   Procedure: COLONOSCOPY;  Surgeon: West Bali, MD;  [facility]: [facility];  Service: Endoscopy;  Laterality: N/A;  8:30   COLONOSCOPY WITH ESOPHAGOGASTRODUODENOSCOPY (EGD)  09/2009   Fields: Normal colonoscopy.  H. pylori gastritis.  Schatzki ring status post esophageal dilation.  Next colonoscopy 10 years   ESOPHAGOGASTRODUODENOSCOPY N/A 10/16/2019   Rourk: Erosive reflux esophagitis, gastric erosions, duodenal diverticulum.  Biopsies from the stomach showed mild inflammation.  No H. pylori.   FRACTURE SURGERY     rt foot   LAPAROTOMY N/A 06/22/2016   Procedure: EXPLORATORY LAPAROTOMY;  Surgeon: Ancil Linsey, MD;  Location: [facility];  Service: General;  Laterality: N/A;   LYSIS OF ADHESION N/A 06/22/2016   Procedure: LYSIS OF  ADHESION;  Surgeon: Ancil Linsey, MD;  Location: [facility];  Service: General;  Laterality: N/A;   MALONEY DILATION N/A 10/16/2019   Procedure: Elease Hashimoto DILATION;  Surgeon: Corbin Ade, MD;  [facility];  Service: Endoscopy;  Laterality: N/A;    Allergies and medications reviewed and updated.   Current Outpatient Medications:    Cholecalciferol (VITAMIN D) 50 MCG (2000 UT) tablet, Take 2,000 Units by mouth daily., Disp: , Rfl:    famotidine (PEPCID) 20 MG tablet, Take 1 tablet (20 mg total) by mouth daily., Disp: 90 tablet, Rfl: 3   ibuprofen (ADVIL) 200 MG tablet, Take 200-400 mg by mouth every 6 (six) hours as needed (for pain.)., Disp: , Rfl:   Allergies  Allergen Reactions   Codeine Itching    Objective:   BP 130/78   Pulse 86   Temp 98.9 F (37.2 C) (Oral)   Ht 5\' 1"  (1.549 m)   Wt 137 lb (62.1 kg)   SpO2 96%   BMI 25.89 kg/m      06/05/2023    3:34 PM 05/22/2023    3:29 PM 02/02/2023    3:49 PM  Vitals with BMI  Height 5\' 1"  5\' 1"  5\' 1"   Weight 137 lbs 136 lbs 133 lbs  BMI 25.9 25.71 25.14  Systolic 130 128 540  Diastolic 78 82 88  Pulse 86 68 78     Physical Exam Vitals and nursing note reviewed.  Constitutional:      Appearance:  Normal appearance. She is normal weight.  HENT:     Head: Normocephalic and atraumatic.     Right Ear: Tympanic membrane, ear canal and external ear normal.     Left Ear: Tympanic membrane, ear canal and external ear normal.     Nose: Rhinorrhea present.     Mouth/Throat:     Mouth: Mucous membranes are moist.     Pharynx: Oropharynx is clear.  Eyes:     Conjunctiva/sclera: Conjunctivae normal.  Cardiovascular:     Rate and Rhythm: Normal rate and regular rhythm.     Pulses: Normal pulses.     Heart sounds: Normal heart sounds.  Pulmonary:     Effort: Pulmonary effort is normal.     Breath sounds: Normal breath sounds.  Musculoskeletal:     Cervical back: No tenderness.  Lymphadenopathy:     Cervical:  No cervical adenopathy.  Skin:    General: Skin is warm and dry.  Neurological:     General: No focal deficit present.     Mental Status: She is alert and oriented to person, place, and time. Mental status is at baseline.  Psychiatric:        Mood and Affect: Mood normal.        Behavior: Behavior normal.        Thought Content: Thought content normal.        Judgment: Judgment normal.     Assessment & Plan:  Viral URI with cough Assessment & Plan: In house flu A and B negative. She does not wish to have a Covid test at this time. Reassured patient that symptoms and exam findings are most consistent with a viral upper respiratory infection and explained lack of efficacy of antibiotics against viruses.  Discussed expected course and features suggestive of secondary bacterial infection.  Continue supportive care. Increase fluid intake with water or electrolyte solution like pedialyte. Encouraged acetaminophen as needed for fever/pain. Encouraged salt water gargling, chloraseptic spray and throat lozenges. Encouraged OTC guaifenesin. Encouraged saline sinus flushes and/or neti with humidified air.    Orders: -     Influenza A and B Ag, Immunoassay     Follow up plan: Return if symptoms worsen or fail to improve.  Park Meo, FNP

## 2023-06-05 NOTE — Assessment & Plan Note (Addendum)
In house flu A and B negative. She does not wish to have a Covid test at this time. Reassured patient that symptoms and exam findings are most consistent with a viral upper respiratory infection and explained lack of efficacy of antibiotics against viruses.  Discussed expected course and features suggestive of secondary bacterial infection.  Continue supportive care. Increase fluid intake with water or electrolyte solution like pedialyte. Encouraged acetaminophen as needed for fever/pain. Encouraged salt water gargling, chloraseptic spray and throat lozenges. Encouraged OTC guaifenesin. Encouraged saline sinus flushes and/or neti with humidified air.

## 2023-06-10 ENCOUNTER — Ambulatory Visit
Admission: EM | Admit: 2023-06-10 | Discharge: 2023-06-10 | Disposition: A | Discharge status: Home / Self Care | Payer: 59 | Attending: Nurse Practitioner | Admitting: Nurse Practitioner

## 2023-06-10 DIAGNOSIS — Z1152 Encounter for screening for COVID-19: Secondary | ICD-10-CM | POA: Insufficient documentation

## 2023-06-10 DIAGNOSIS — J069 Acute upper respiratory infection, unspecified: Secondary | ICD-10-CM | POA: Insufficient documentation

## 2023-06-10 MED ORDER — FLUTICASONE PROPIONATE 50 MCG/ACT NA SUSP: 2.0000 | Freq: Every day | NASAL | 0 refills | Status: AC

## 2023-06-10 NOTE — ED Provider Notes (Signed)
 [facility]    CSN: [id number] Arrival date & time: 06/10/23  1001      History   Chief Complaint No chief complaint on file.   HPI Rebecca Osborne is a 73 y.o. female.   The history is provided by the patient.   Patient presents for complaints of cough, nasal congestion, and runny nose has been present for the past several days.  Patient denies fever, chills, headache, ear pain, ear drainage, wheezing, difficulty breathing, chest pain, abdominal pain, nausea, vomiting, diarrhea, or rash.  Patient states that she has been taking ibuprofen  for her symptoms.  Patient is requesting a COVID test today.  Past Medical History:  Diagnosis Date   Allergy    Cancer (HCC)    cervical cancer cells   GERD (gastroesophageal reflux disease)    Osteopenia     Patient Active Problem List   Diagnosis Date Noted   Viral URI with cough 05/22/2023   Lower abdominal pain 12/27/2022   Esophageal dysphagia 08/28/2019   Colon cancer screening    Hypomagnesemia 04/29/2019   C. difficile colitis 04/17/2019   Hypokalemia    Colitis    Diarrhea of presumed infectious origin    Pancolitis (HCC) 03/11/2019   Normocytic anemia 03/11/2019   Complex regional pain syndrome type 1 of right lower extremity 10/22/2018   Small bowel obstruction due to adhesions (HCC) 06/22/2016   GERD 08/31/2009   DYSPHAGIA UNSPECIFIED 08/31/2009    Past Surgical History:  Procedure Laterality Date   ABDOMINAL HYSTERECTOMY     BIOPSY  10/16/2019   Procedure: BIOPSY;  Surgeon: Shaaron Lamar HERO, MD;  [facility];  Service: Endoscopy;;   BOWEL RESECTION N/A 06/22/2016   Procedure: SMALL BOWEL RESECTION;  Surgeon: Selinda Artist Moats, MD;  Location: [facility];  Service: General;  Laterality: N/A;   CHOLECYSTECTOMY  2005   COLONOSCOPY  08/23/2019   Tortuous left colon, external and internal hemorrhoids   COLONOSCOPY N/A 08/23/2019   Procedure: COLONOSCOPY;  Surgeon: Harvey Margo CROME, MD;  [facility];  Service: Endoscopy;  Laterality: N/A;  8:30   COLONOSCOPY WITH ESOPHAGOGASTRODUODENOSCOPY (EGD)  09/2009   Fields: Normal colonoscopy.  H. pylori gastritis.  Schatzki ring status post esophageal dilation.  Next colonoscopy 10 years   ESOPHAGOGASTRODUODENOSCOPY N/A 10/16/2019   Rourk: Erosive reflux esophagitis, gastric erosions, duodenal diverticulum.  Biopsies from the stomach showed mild inflammation.  No H. pylori.   FRACTURE SURGERY     rt foot   LAPAROTOMY N/A 06/22/2016   Procedure: EXPLORATORY LAPAROTOMY;  Surgeon: Selinda Artist Moats, MD;  Location: [facility];  Service: General;  Laterality: N/A;   LYSIS OF ADHESION N/A 06/22/2016   Procedure: LYSIS OF ADHESION;  Surgeon: Selinda Artist Moats, MD;  Location: [facility];  Service: General;  Laterality: N/A;   MALONEY DILATION N/A 10/16/2019   Procedure: AGAPITO DILATION;  Surgeon: Shaaron Lamar HERO, MD;  [facility];  Service: Endoscopy;  Laterality: N/A;    OB History     Gravida      Para      Term      Preterm      AB      Living  3      SAB      IAB      Ectopic      Multiple      Live Births  Home Medications    Prior to Admission medications   Medication Sig Start Date End Date Taking? Authorizing Provider  Cholecalciferol (VITAMIN D ) 50 MCG (2000 UT) tablet Take 2,000 Units by mouth daily.   Yes [provider]  famotidine  (PEPCID ) 20 MG tablet Take 1 tablet (20 mg total) by mouth daily. 12/27/22  Yes Ezzard Sonny RAMAN, PA-C  fluticasone  (FLONASE ) 50 MCG/ACT nasal spray Place 2 sprays into both nostrils daily. 06/10/23  Yes Leath-Warren, Etta PARAS, NP  ibuprofen  (ADVIL ) 200 MG tablet Take 200-400 mg by mouth every 6 (six) hours as needed (for pain.).   Yes [provider]    Family History Family History  Problem Relation Age of Onset   COPD Mother    Heart disease Mother    Miscarriages / Stillbirths Mother    Vision loss Mother        from shingles   COPD  Father    Cancer Brother        leukemia   Early death Brother    Colon cancer Neg Hx    Colon polyps Neg Hx    Inflammatory bowel disease Neg Hx     Social History Social History   Tobacco Use   Smoking status: Never   Smokeless tobacco: Never  Vaping Use   Vaping status: Never Used  Substance Use Topics   Alcohol use: No   Drug use: No     Allergies   Codeine   Review of Systems Review of Systems Per HPI  Physical Exam Triage Vital Signs ED Triage Vitals [06/10/23 1011]  Encounter Vitals Group     BP 137/81     Systolic BP Percentile      Diastolic BP Percentile      Pulse Rate 81     Resp 16     Temp 98 F (36.7 C)     Temp Source Oral     SpO2 97 %     Weight      Height      Head Circumference      Peak Flow      Pain Score      Pain Loc      Pain Education      Exclude from Growth Chart    No data found.  Updated Vital Signs BP 137/81 (BP Location: Left Arm)   Pulse 81   Temp 98 F (36.7 C) (Oral)   Resp 16   SpO2 97%   Visual Acuity Right Eye Distance:   Left Eye Distance:   Bilateral Distance:    Right Eye Near:   Left Eye Near:    Bilateral Near:     Physical Exam Vitals and nursing note reviewed.  Constitutional:      General: She is not in acute distress.    Appearance: Normal appearance.  HENT:     Head: Normocephalic.     Right Ear: Tympanic membrane, ear canal and external ear normal.     Left Ear: Tympanic membrane, ear canal and external ear normal.     Nose: Congestion and rhinorrhea present.     Right Turbinates: Enlarged and swollen.     Left Turbinates: Enlarged and swollen.     Right Sinus: No maxillary sinus tenderness or frontal sinus tenderness.     Left Sinus: No maxillary sinus tenderness or frontal sinus tenderness.     Mouth/Throat:     Lips: Pink.     Mouth: Mucous membranes are moist.  Pharynx: Uvula midline. Postnasal drip present. No pharyngeal swelling, oropharyngeal exudate, posterior  oropharyngeal erythema or uvula swelling.     Comments: Cobblestoning present to posterior oropharynx  Eyes:     Extraocular Movements: Extraocular movements intact.     Conjunctiva/sclera: Conjunctivae normal.     Pupils: Pupils are equal, round, and reactive to light.  Cardiovascular:     Rate and Rhythm: Normal rate and regular rhythm.     Pulses: Normal pulses.     Heart sounds: Normal heart sounds.  Pulmonary:     Effort: Pulmonary effort is normal. No respiratory distress.     Breath sounds: Normal breath sounds. No stridor. No wheezing, rhonchi or rales.  Abdominal:     General: Bowel sounds are normal.     Palpations: Abdomen is soft.     Tenderness: There is no abdominal tenderness.  Musculoskeletal:     Cervical back: Normal range of motion.  Lymphadenopathy:     Cervical: No cervical adenopathy.  Skin:    General: Skin is warm and dry.  Neurological:     General: No focal deficit present.     Mental Status: She is alert and oriented to person, place, and time.  Psychiatric:        Mood and Affect: Mood normal.        Behavior: Behavior normal.      UC Treatments / Results  Labs (all labs ordered are listed, but only abnormal results are displayed) Labs Reviewed  SARS CORONAVIRUS 2 (TAT 6-24 HRS)    EKG   Radiology No results found.  Procedures Procedures (including critical care time)  Medications Ordered in UC Medications - No data to display  Initial Impression / Assessment and Plan / UC Course  I have reviewed the triage vital signs and the nursing notes.  Pertinent labs & imaging results that were available during my care of the patient were reviewed by me and considered in my medical decision making (see chart for details).  COVID test is pending.  Patient is able to receive Paxlovid  if her COVID test is positive.  On exam, lung sounds are clear throughout, room air sats at 97%.  Suspect patient has a viral upper respiratory infection.   Symptomatic treatment provided with fluticasone  50 mcg nasal spray.  Patient elects to purchase over-the-counter medication for her cough.  Supportive care recommendations were provided and discussed with the patient to include fluids, rest, normal saline nasal spray, and use of a humidifier at nighttime during sleep.  Discussed indications with the patient regarding follow-up.  Patient was in agreement with this plan of care and verbalizes understanding.  All questions were answered.  Patient stable for discharge.  Final Clinical Impressions(s) / UC Diagnoses   Final diagnoses:  Viral URI with cough  Encounter for screening for COVID-19     Discharge Instructions      COVID test is pending. You will be contacted if the test result is positive. You will also be able to access the result using MyChart. Take medication as prescribed. Increase fluids and allow for plenty of rest. Recommend Tylenol  as needed for pain, fever, or general discomfort. Normal saline nasal spray for nasal congestion and runny nose.  Recommend using a humidifier at bedtime during sleep to help with cough and nasal congestion. It may also be helpful to sleep elevated while symptoms persist.  If symptoms fail to improve over the next several days, or you have worsening symptoms, you may follow-up with  your PCP for further evaluation.  Follow-up if your symptoms do not improve.      ED Prescriptions     Medication Sig Dispense Auth. Provider   fluticasone  (FLONASE ) 50 MCG/ACT nasal spray Place 2 sprays into both nostrils daily. 16 g Leath-Warren, Etta PARAS, NP      PDMP not reviewed this encounter.   Gilmer Etta PARAS, NP 06/10/23 1041

## 2023-06-10 NOTE — Discharge Instructions (Addendum)
 COVID test is pending. You will be contacted if the test result is positive. You will also be able to access the result using MyChart. Take medication as prescribed. Increase fluids and allow for plenty of rest. Recommend Tylenol  as needed for pain, fever, or general discomfort. Normal saline nasal spray for nasal congestion and runny nose.  Recommend using a humidifier at bedtime during sleep to help with cough and nasal congestion. It may also be helpful to sleep elevated while symptoms persist.  If symptoms fail to improve over the next several days, or you have worsening symptoms, you may follow-up with your PCP for further evaluation.  Follow-up if your symptoms do not improve.

## 2023-06-10 NOTE — ED Triage Notes (Signed)
 Cough,nasal congestion and fatigue x 4 days. Denies any recent fever.

## 2023-06-11 LAB — SARS CORONAVIRUS 2 (TAT 6-24 HRS): SARS Coronavirus 2: POSITIVE — AB

## 2023-06-13 ENCOUNTER — Telehealth: Payer: Self-pay | Admitting: Nurse Practitioner

## 2023-06-13 ENCOUNTER — Telehealth: Payer: Self-pay | Admitting: Emergency Medicine

## 2023-06-13 MED ORDER — PAXLOVID (300/100) 20 X 150 MG & 10 X 100MG PO TBPK: 3.0000 | ORAL_TABLET | Freq: Two times a day (BID) | ORAL | 0 refills | Status: AC

## 2023-06-13 NOTE — Telephone Encounter (Signed)
 Patient positive for COVID-19 and requesting Paxlovid.  Recent GFR > 60 August 2024.  Paxlovid sent to pharmacy.

## 2023-06-13 NOTE — Telephone Encounter (Signed)
 Pt called and inquired about additional px with covid result being positive. Pt verified pharmacy and discussed pt question with provider. Provider reported would electronically send in Paxlovid to [facility] in [place].

## 2023-07-07 ENCOUNTER — Other Ambulatory Visit (HOSPITAL_COMMUNITY): Payer: Self-pay | Admitting: Family Medicine

## 2023-07-07 DIAGNOSIS — Z1231 Encounter for screening mammogram for malignant neoplasm of breast: Secondary | ICD-10-CM

## 2023-07-31 ENCOUNTER — Ambulatory Visit (HOSPITAL_COMMUNITY)
Admission: RE | Admit: 2023-07-31 | Discharge: 2023-07-31 | Disposition: A | Discharge status: Home / Self Care | Payer: 59 | Source: Ambulatory Visit | Attending: Family Medicine | Admitting: Family Medicine

## 2023-07-31 ENCOUNTER — Encounter (HOSPITAL_COMMUNITY): Payer: Self-pay

## 2023-07-31 DIAGNOSIS — Z1231 Encounter for screening mammogram for malignant neoplasm of breast: Secondary | ICD-10-CM | POA: Diagnosis not present

## 2023-08-03 ENCOUNTER — Ambulatory Visit: Payer: Self-pay | Admitting: Family Medicine

## 2023-08-03 ENCOUNTER — Encounter: Payer: Self-pay | Admitting: Family Medicine

## 2023-08-03 ENCOUNTER — Ambulatory Visit (INDEPENDENT_AMBULATORY_CARE_PROVIDER_SITE_OTHER): Payer: 59 | Admitting: Family Medicine

## 2023-08-03 VITALS — BP 116/62 | HR 73 | Temp 97.6°F | Ht 61.0 in | Wt 133.0 lb

## 2023-08-03 DIAGNOSIS — K644 Residual hemorrhoidal skin tags: Secondary | ICD-10-CM

## 2023-08-03 NOTE — Progress Notes (Signed)
 Subjective:    Patient ID: Rebecca Osborne, female    DOB: 1950-09-14, 73 y.o.   MRN: [id number]  Patient is a 73 year old Caucasian female who presents today complaining of a lump near her rectum.  She states that she has noticed it for about a week.  She denies any pain around the rectum.  She denies any bleeding.  She denies any hematochezia.  She denies any melena.  She denies any severe pain with defecation.  My nurse was present for the exam.  On rectal exam, the patient has redundant skin around the rectum with a rectal skin tag.  There is no inflamed or thrombosed external hemorrhoid.  There is no visible protruding internal hemorrhoid.  On digital exam there is no palpable rectal mass.  I believe the patient is feeling the redundant skin along with the anal skin tag Past Medical History:  Diagnosis Date   Allergy    Cancer (HCC)    cervical cancer cells   GERD (gastroesophageal reflux disease)    Osteopenia    Past Surgical History:  Procedure Laterality Date   ABDOMINAL HYSTERECTOMY     BIOPSY  10/16/2019   Procedure: BIOPSY;  Surgeon: Corbin Ade, MD;  [facility];  Service: Endoscopy;;   BOWEL RESECTION N/A 06/22/2016   Procedure: SMALL BOWEL RESECTION;  Surgeon: Ancil Linsey, MD;  [facility];  Service: General;  Laterality: N/A;   CHOLECYSTECTOMY  2005   COLONOSCOPY  08/23/2019   Tortuous left colon, external and internal hemorrhoids   COLONOSCOPY N/A 08/23/2019   Procedure: COLONOSCOPY;  Surgeon: West Bali, MD;  [facility];  Service: Endoscopy;  Laterality: N/A;  8:30   COLONOSCOPY WITH ESOPHAGOGASTRODUODENOSCOPY (EGD)  09/2009   Fields: Normal colonoscopy.  H. pylori gastritis.  Schatzki ring status post esophageal dilation.  Next colonoscopy 10 years   ESOPHAGOGASTRODUODENOSCOPY N/A 10/16/2019   Rourk: Erosive reflux esophagitis, gastric erosions, duodenal diverticulum.  Biopsies from the stomach showed mild inflammation.  No H.  pylori.   FRACTURE SURGERY     rt foot   LAPAROTOMY N/A 06/22/2016   Procedure: EXPLORATORY LAPAROTOMY;  Surgeon: Ancil Linsey, MD;  Location[facility];  Service: General;  Laterality: N/A;   LYSIS OF ADHESION N/A 06/22/2016   Procedure: LYSIS OF ADHESION;  Surgeon: Ancil Linsey, MD;  Location[facility];  Service: General;  Laterality: N/A;   MALONEY DILATION N/A 10/16/2019   Procedure: Elease Hashimoto DILATION;  Surgeon: Corbin Ade, MD;  [facility];  Service: Endoscopy;  Laterality: N/A;   Current Outpatient Medications on File Prior to Visit  Medication Sig Dispense Refill   Cholecalciferol (VITAMIN D) 50 MCG (2000 UT) tablet Take 2,000 Units by mouth daily.     famotidine (PEPCID) 20 MG tablet Take 1 tablet (20 mg total) by mouth daily. 90 tablet 3   fluticasone (FLONASE) 50 MCG/ACT nasal spray Place 2 sprays into both nostrils daily. 16 g 0   ibuprofen (ADVIL) 200 MG tablet Take 200-400 mg by mouth every 6 (six) hours as needed (for pain.).     No current facility-administered medications on file prior to visit.   Allergies  Allergen Reactions   Codeine Itching   Social History   Socioeconomic History   Marital status: Widowed    Spouse name: Not on file   Number of children: 3   Years of education: Not on file   Highest education level: Not on file  Occupational History   Not on file  Tobacco Use   Smoking status: Never   Smokeless tobacco: Never  Vaping Use   Vaping status: Never Used  Substance and Sexual Activity   Alcohol use: No   Drug use: No   Sexual activity: Not Currently  Other Topics Concern   Not on file  Social History Narrative   Husband died in Sep 02, 2010.   Social Drivers of Corporate investment banker Strain: Low Risk  (06/21/2022)   Overall Financial Resource Strain (CARDIA)    Difficulty of Paying Living Expenses: Not very hard  Food Insecurity: No Food Insecurity (06/21/2022)   Hunger Vital Sign    Worried About Running Out of Food in  the Last Year: Never true    Ran Out of Food in the Last Year: Never true  Transportation Needs: Unmet Transportation Needs (06/21/2022)   PRAPARE - Administrator, Civil Service (Medical): Yes    Lack of Transportation (Non-Medical): Yes  Physical Activity: Insufficiently Active (06/21/2022)   Exercise Vital Sign    Days of Exercise per Week: 3 days    Minutes of Exercise per Session: 30 min  Stress: Stress Concern Present (06/21/2022)   [employer] of Occupational Health - Occupational Stress Questionnaire    Feeling of Stress : To some extent  Social Connections: Moderately Integrated (06/21/2022)   Social Connection and Isolation Panel [NHANES]    Frequency of Communication with Friends and Family: More than three times a week    Frequency of Social Gatherings with Friends and Family: Three times a week    Attends Religious Services: More than 4 times per year    Active Member of Clubs or Organizations: Yes    Attends Banker Meetings: More than 4 times per year    Marital Status: Widowed  Intimate Partner Violence: Not At Risk (06/21/2022)   Humiliation, Afraid, Rape, and Kick questionnaire    Fear of Current or Ex-Partner: No    Emotionally Abused: No    Physically Abused: No    Sexually Abused: No      Review of Systems  All other systems reviewed and are negative.      Objective:   Physical Exam Vitals reviewed. Exam conducted with a chaperone present.  Constitutional:      General: She is not in acute distress.    Appearance: Normal appearance. She is not ill-appearing or toxic-appearing.  Cardiovascular:     Rate and Rhythm: Normal rate and regular rhythm. No extrasystoles are present.    Chest Wall: PMI is not displaced. No thrill.     Pulses: Normal pulses.     Heart sounds: Murmur heard.     Systolic murmur is present with a grade of 1/6.     No friction rub.  Pulmonary:     Effort: Pulmonary effort is normal. No respiratory  distress.     Breath sounds: No stridor. No wheezing, rhonchi or rales.  Abdominal:     General: Abdomen is flat. Bowel sounds are normal.     Palpations: Abdomen is soft.     Tenderness: There is no abdominal tenderness. There is no rebound.  Genitourinary:    Rectum: No tenderness, anal fissure, external hemorrhoid or internal hemorrhoid.    Lymphadenopathy:     Cervical: No cervical adenopathy.  Neurological:     Mental Status: She is alert.           Assessment & Plan:  Anal  skin tag There is no pathologic rectal mass, inflamed external hemorrhoid, or prolapsed internal hemorrhoid.  The patient does have redundant skin around the rectum along with a anal skin tag.  Reassurance is provided

## 2023-08-03 NOTE — Telephone Encounter (Signed)
 Copied from CRM [id number]. Topic: Clinical - Red Word Triage >> Aug 03, 2023 11:21 AM Fonda Kinder J wrote: [facility] that prompted transfer to Nurse Triage: skin tearing/pain inside butt  Pt originally called in to schedule Colonoscopy but she is not due for it yet   Chief Complaint: Rectal lump Symptoms: Rectal lump after BM that reduces itself  Frequency: Frequent  Pertinent Negatives: Patient denies pain to the area  Disposition: [] ED /[] Urgent Care (no appt availability in office) / [x] Appointment(In office/virtual)/ []  [facility]/ [] Home Care/ [] Refused Recommended Disposition /[] [place] [facility]/ []  Follow-up with PCP Additional Notes: Patient reports that for the last month she has had a rectal protrusion after a bowel movement. She denies any pain and states that the protrusion reduces by itself. Patient is concerned and would like to have a colonoscopy scheduled for next week. Appointment made for today for evaluation of the patient's symptoms.    Reason for Disposition  Painless lump in rectal area  Answer Assessment - Initial Assessment Questions 1. SYMPTOM:  "What's the main symptom you're concerned about?" (e.g., pain, itching, swelling, rash)     Protrusion from anus after bowel movement that resolves itself. 2. ONSET: "When did the protrusion start?"     1 month  3. RECTAL PAIN: "Do you have any pain around your rectum?" "How bad is the pain?"  (Scale 0-10; or mild, moderate, severe)   - NONE (0): no pain   - MILD (1-3): doesn't interfere with normal activities    - MODERATE (4-7): interferes with normal activities or awakens from sleep, limping    - SEVERE (8-10): excruciating pain, unable to have a bowel movement      None 4. RECTAL ITCHING: "Do you have any itching in this area?" "How bad is the itching?"  (Scale 0-10; or mild, moderate, severe)   - NONE: no itching   - MILD: doesn't interfere with normal activities    - MODERATE-SEVERE: interferes with  normal activities or awakens from sleep     No 5. CONSTIPATION: "Do you have constipation?" If Yes, ask: "How bad is it?"     No 6. CAUSE: "What do you think is causing the anus symptoms?"     Unsure 7. OTHER SYMPTOMS: "Do you have any other symptoms?"  (e.g., abdomen pain, fever, rectal bleeding, vomiting)     No 8. PREGNANCY: "Is there any chance you are pregnant?" "When was your last menstrual period?"     No  Protocols used: Rectal Symptoms-A-AH

## 2023-09-20 DIAGNOSIS — H04123 Dry eye syndrome of bilateral lacrimal glands: Secondary | ICD-10-CM | POA: Diagnosis not present

## 2023-09-20 DIAGNOSIS — H2513 Age-related nuclear cataract, bilateral: Secondary | ICD-10-CM | POA: Diagnosis not present

## 2023-09-20 DIAGNOSIS — H1045 Other chronic allergic conjunctivitis: Secondary | ICD-10-CM | POA: Diagnosis not present

## 2023-10-31 ENCOUNTER — Telehealth: Payer: Self-pay

## 2023-10-31 NOTE — Telephone Encounter (Signed)
 Copied from CRM [id number]. Topic: Clinical - Medical Advice >> Oct 31, 2023  3:47 PM Sophia H wrote: Reason for CRM: Pt states has bad ringing in her ears, decl to sch an appt. Wants to know if anything can be called into the pharmacy for her.  Please advise - [phone]  [facility] - [place], [place] - [address]

## 2023-11-22 ENCOUNTER — Ambulatory Visit

## 2023-11-23 ENCOUNTER — Encounter: Payer: Self-pay | Admitting: Family Medicine

## 2023-11-23 ENCOUNTER — Ambulatory Visit: Admitting: Family Medicine

## 2023-11-23 VITALS — BP 140/80 | HR 70 | Temp 98.5°F | Ht 61.0 in | Wt 135.2 lb

## 2023-11-23 DIAGNOSIS — H9313 Tinnitus, bilateral: Secondary | ICD-10-CM | POA: Diagnosis not present

## 2023-11-23 NOTE — Progress Notes (Signed)
 Subjective:    Patient ID: Rebecca Osborne, female    DOB: 06/25/1950, 73 y.o.   MRN: [id number]  Patient is a 73 year old Caucasian female who presents today with tinnitus.  She states that the ringing sound in her ears is driving her crazy.  She has tried several products from [place] but has not helped.  She denies any hearing loss.  She denies any vertigo.  She denies any sinus pain.  She denies any headaches.  Instead she reports instant ringing sound in both ears. Past Medical History:  Diagnosis Date   Allergy    Cancer (HCC)    cervical cancer cells   GERD (gastroesophageal reflux disease)    Osteopenia    Past Surgical History:  Procedure Laterality Date   ABDOMINAL HYSTERECTOMY     BIOPSY  10/16/2019   Procedure: BIOPSY;  Surgeon: Suzette Espy, MD;  [facility];  Service: Endoscopy;;   BOWEL RESECTION N/A 06/22/2016   Procedure: SMALL BOWEL RESECTION;  Surgeon: Franki Isles, MD;  Location: [facility];  Service: General;  Laterality: N/A;   CHOLECYSTECTOMY  2005   COLONOSCOPY  08/23/2019   Tortuous left colon, external and internal hemorrhoids   COLONOSCOPY N/A 08/23/2019   Procedure: COLONOSCOPY;  Surgeon: Alyce Jubilee, MD;  [facility];  Service: Endoscopy;  Laterality: N/A;  8:30   COLONOSCOPY WITH ESOPHAGOGASTRODUODENOSCOPY (EGD)  09/2009   Fields: Normal colonoscopy.  H. pylori gastritis.  Schatzki ring status post esophageal dilation.  Next colonoscopy 10 years   ESOPHAGOGASTRODUODENOSCOPY N/A 10/16/2019   Rourk: Erosive reflux esophagitis, gastric erosions, duodenal diverticulum.  Biopsies from the stomach showed mild inflammation.  No H. pylori.   FRACTURE SURGERY     rt foot   LAPAROTOMY N/A 06/22/2016   Procedure: EXPLORATORY LAPAROTOMY;  Surgeon: Franki Isles, MD;  Location: [facility];  Service: General;  Laterality: N/A;   LYSIS OF ADHESION N/A 06/22/2016   Procedure: LYSIS OF ADHESION;  Surgeon: Franki Isles, MD;  Location: [facility];  Service: General;  Laterality: N/A;   MALONEY DILATION N/A 10/16/2019   Procedure: Londa Rival DILATION;  Surgeon: Suzette Espy, MD;  [facility];  Service: Endoscopy;  Laterality: N/A;   Current Outpatient Medications on File Prior to Visit  Medication Sig Dispense Refill   Cholecalciferol (VITAMIN D ) 50 MCG (2000 UT) tablet Take 2,000 Units by mouth daily.     famotidine  (PEPCID ) 20 MG tablet Take 1 tablet (20 mg total) by mouth daily. 90 tablet 3   fluticasone  (FLONASE ) 50 MCG/ACT nasal spray Place 2 sprays into both nostrils daily. 16 g 0   ibuprofen  (ADVIL ) 200 MG tablet Take 200-400 mg by mouth every 6 (six) hours as needed (for pain.).     No current facility-administered medications on file prior to visit.   Allergies  Allergen Reactions   Codeine Itching   Social History   Socioeconomic History   Marital status: Widowed    Spouse name: Not on file   Number of children: 3   Years of education: Not on file   Highest education level: Not on file  Occupational History   Not on file  Tobacco Use   Smoking status: Never   Smokeless tobacco: Never  Vaping Use   Vaping status: Never Used  Substance and Sexual Activity   Alcohol use: No   Drug use: No   Sexual activity: Not Currently  Other Topics Concern   Not  on file  Social History Narrative   Husband died in 12/16/10.   Social Drivers of Corporate investment banker Strain: Low Risk  (06/21/2022)   Overall Financial Resource Strain (CARDIA)    Difficulty of Paying Living Expenses: Not very hard  Food Insecurity: No Food Insecurity (06/21/2022)   Hunger Vital Sign    Worried About Running Out of Food in the Last Year: Never true    Ran Out of Food in the Last Year: Never true  Transportation Needs: Unmet Transportation Needs (06/21/2022)   PRAPARE - Administrator, Civil Service (Medical): Yes    Lack of Transportation (Non-Medical): Yes  Physical Activity: Insufficiently Active (06/21/2022)    Exercise Vital Sign    Days of Exercise per Week: 3 days    Minutes of Exercise per Session: 30 min  Stress: Stress Concern Present (06/21/2022)   [employer] of Occupational Health - Occupational Stress Questionnaire    Feeling of Stress : To some extent  Social Connections: Moderately Integrated (06/21/2022)   Social Connection and Isolation Panel    Frequency of Communication with Friends and Family: More than three times a week    Frequency of Social Gatherings with Friends and Family: Three times a week    Attends Religious Services: More than 4 times per year    Active Member of Clubs or Organizations: Yes    Attends Banker Meetings: More than 4 times per year    Marital Status: Widowed  Intimate Partner Violence: Not At Risk (06/21/2022)   Humiliation, Afraid, Rape, and Kick questionnaire    Fear of Current or Ex-Partner: No    Emotionally Abused: No    Physically Abused: No    Sexually Abused: No      Review of Systems  All other systems reviewed and are negative.      Objective:   Physical Exam Vitals reviewed.  Constitutional:      General: She is not in acute distress.    Appearance: Normal appearance. She is not ill-appearing or toxic-appearing.  HENT:     Right Ear: Tympanic membrane and ear canal normal.     Left Ear: Tympanic membrane and ear canal normal.     Nose: Nose normal. No congestion or rhinorrhea.   Cardiovascular:     Rate and Rhythm: Normal rate and regular rhythm. No extrasystoles are present.    Chest Wall: PMI is not displaced. No thrill.     Pulses: Normal pulses.     Heart sounds: Murmur heard.     Systolic murmur is present with a grade of 1/6.     No friction rub.  Pulmonary:     Effort: Pulmonary effort is normal. No respiratory distress.     Breath sounds: No stridor. No wheezing, rhonchi or rales.  Abdominal:     General: Bowel sounds are normal.  Lymphadenopathy:     Cervical: No cervical adenopathy.    Neurological:     Mental Status: She is alert.           Assessment & Plan:  Tinnitus aurium, bilateral I explained to the patient that there are no medications that have been proven to work for tinnitus.  I suspect that this is likely underlying nerve damage in his inner ear.  At the present time she demonstrates no sign of hearing loss.  There is no evidence of Mnire's disease.  Therefore reassured the patient that this is a benign process.  I  offer the patient a referral to ENT for a second opinion however she is comfortable simply monitoring the situation present.

## 2023-12-26 ENCOUNTER — Encounter: Payer: Self-pay | Admitting: Family Medicine

## 2023-12-26 ENCOUNTER — Ambulatory Visit: Admitting: Family Medicine

## 2023-12-26 VITALS — BP 110/62 | HR 71 | Temp 97.7°F | Ht 61.0 in | Wt 136.4 lb

## 2023-12-26 DIAGNOSIS — Z0001 Encounter for general adult medical examination with abnormal findings: Secondary | ICD-10-CM | POA: Diagnosis not present

## 2023-12-26 DIAGNOSIS — M8589 Other specified disorders of bone density and structure, multiple sites: Secondary | ICD-10-CM

## 2023-12-26 DIAGNOSIS — D649 Anemia, unspecified: Secondary | ICD-10-CM | POA: Diagnosis not present

## 2023-12-26 DIAGNOSIS — Z13220 Encounter for screening for lipoid disorders: Secondary | ICD-10-CM

## 2023-12-26 DIAGNOSIS — Z0000 Encounter for general adult medical examination without abnormal findings: Secondary | ICD-10-CM

## 2023-12-26 LAB — LIPID PANEL
Cholesterol: 250 mg/dL — ABNORMAL HIGH (ref <200)
HDL: 49 mg/dL — ABNORMAL LOW (ref >=50)
LDL Cholesterol (Calc): 178 mg/dL — ABNORMAL HIGH
Non-HDL Cholesterol (Calc): 201 mg/dL — ABNORMAL HIGH (ref <130)
Total CHOL/HDL Ratio: 5.1 (calc) — ABNORMAL HIGH (ref <5.0)
Triglycerides: 108 mg/dL (ref <150)

## 2023-12-26 LAB — CBC WITH DIFFERENTIAL/PLATELET
Absolute Lymphocytes: 1773 {cells}/uL (ref 850–3900)
Absolute Monocytes: 486 {cells}/uL (ref 200–950)
Basophils Absolute: 32 {cells}/uL (ref 0–200)
Basophils Relative: 0.5 %
Eosinophils Absolute: 83 {cells}/uL (ref 15–500)
Eosinophils Relative: 1.3 %
HCT: 37.4 % (ref 35.0–45.0)
Hemoglobin: 12.7 g/dL (ref 11.7–15.5)
MCH: 31.5 pg (ref 27.0–33.0)
MCHC: 34 g/dL (ref 32.0–36.0)
MCV: 92.8 fL (ref 80.0–100.0)
MPV: 10.5 fL (ref 7.5–12.5)
Monocytes Relative: 7.6 %
Neutro Abs: 4026 {cells}/uL (ref 1500–7800)
Neutrophils Relative %: 62.9 %
Platelets: 250 Thousand/uL (ref 140–400)
RBC: 4.03 Million/uL (ref 3.80–5.10)
RDW: 12.4 % (ref 11.0–15.0)
Total Lymphocyte: 27.7 %
WBC: 6.4 Thousand/uL (ref 3.8–10.8)

## 2023-12-26 LAB — COMPREHENSIVE METABOLIC PANEL WITH GFR
AG Ratio: 1.8 (calc) (ref 1.0–2.5)
ALT: 10 U/L (ref 6–29)
AST: 17 U/L (ref 10–35)
Albumin: 4.4 g/dL (ref 3.6–5.1)
Alkaline phosphatase (APISO): 94 U/L (ref 37–153)
BUN/Creatinine Ratio: 18 (calc) (ref 6–22)
BUN: 19 mg/dL (ref 7–25)
CO2: 28 mmol/L (ref 20–32)
Calcium: 9.7 mg/dL (ref 8.6–10.4)
Chloride: 103 mmol/L (ref 98–110)
Creat: 1.05 mg/dL — ABNORMAL HIGH (ref 0.60–1.00)
Globulin: 2.5 g/dL (ref 1.9–3.7)
Glucose, Bld: 99 mg/dL (ref 65–99)
Potassium: 4.4 mmol/L (ref 3.5–5.3)
Sodium: 139 mmol/L (ref 135–146)
Total Bilirubin: 0.6 mg/dL (ref 0.2–1.2)
Total Protein: 6.9 g/dL (ref 6.1–8.1)
eGFR: 56 mL/min/1.73m2 — ABNORMAL LOW (ref >=60)

## 2023-12-26 NOTE — Progress Notes (Signed)
 Subjective:    Patient ID: Rebecca Osborne, female    DOB: 07-20-1950, 73 y.o.   MRN: [id number]  Patient is a 73 year old Caucasian female who presents today for complete physical exam.  She is due for Prevnar 20.  She has had Shingrix.  She is due for an RSV vaccine.  She is also due for flu and COVID vaccinations in the fall.  She declines all of these at the present time patient had a mammogram earlier this year that was normal.  Due to her age she does not require a Pap smear.  Her last colonoscopy was in 2021.  Therefore she is up-to-date on colon cancer screening at the present time.  She is due for a bone density test.  Her last bone density showed osteopenia but this was 3 years ago.  She is also due for fasting lab work. Past Medical History:  Diagnosis Date   Allergy    Cancer (HCC)    cervical cancer cells   GERD (gastroesophageal reflux disease)    Osteopenia    Past Surgical History:  Procedure Laterality Date   ABDOMINAL HYSTERECTOMY     BIOPSY  10/16/2019   Procedure: BIOPSY;  Surgeon: Shaaron Lamar HERO, MD;  [facility];  Service: Endoscopy;;   BOWEL RESECTION N/A 06/22/2016   Procedure: SMALL BOWEL RESECTION;  Surgeon: Selinda Artist Moats, MD;  Location: [facility];  Service: General;  Laterality: N/A;   CHOLECYSTECTOMY  2005   COLONOSCOPY  08/23/2019   Tortuous left colon, external and internal hemorrhoids   COLONOSCOPY N/A 08/23/2019   Procedure: COLONOSCOPY;  Surgeon: Harvey Margo CROME, MD;  [facility];  Service: Endoscopy;  Laterality: N/A;  8:30   COLONOSCOPY WITH ESOPHAGOGASTRODUODENOSCOPY (EGD)  09/2009   Fields: Normal colonoscopy.  H. pylori gastritis.  Schatzki ring status post esophageal dilation.  Next colonoscopy 10 years   ESOPHAGOGASTRODUODENOSCOPY N/A 10/16/2019   Rourk: Erosive reflux esophagitis, gastric erosions, duodenal diverticulum.  Biopsies from the stomach showed mild inflammation.  No H. pylori.   FRACTURE SURGERY     rt foot    LAPAROTOMY N/A 06/22/2016   Procedure: EXPLORATORY LAPAROTOMY;  Surgeon: Selinda Artist Moats, MD;  Location: [facility];  Service: General;  Laterality: N/A;   LYSIS OF ADHESION N/A 06/22/2016   Procedure: LYSIS OF ADHESION;  Surgeon: Selinda Artist Moats, MD;  Location: [facility];  Service: General;  Laterality: N/A;   MALONEY DILATION N/A 10/16/2019   Procedure: AGAPITO DILATION;  Surgeon: Shaaron Lamar HERO, MD;  [facility];  Service: Endoscopy;  Laterality: N/A;   Current Outpatient Medications on File Prior to Visit  Medication Sig Dispense Refill   Cholecalciferol (VITAMIN D ) 50 MCG (2000 UT) tablet Take 2,000 Units by mouth daily. (Patient not taking: Reported on 12/26/2023)     famotidine  (PEPCID ) 20 MG tablet Take 1 tablet (20 mg total) by mouth daily. (Patient not taking: Reported on 12/26/2023) 90 tablet 3   fluticasone  (FLONASE ) 50 MCG/ACT nasal spray Place 2 sprays into both nostrils daily. (Patient not taking: Reported on 12/26/2023) 16 g 0   ibuprofen  (ADVIL ) 200 MG tablet Take 200-400 mg by mouth every 6 (six) hours as needed (for pain.). (Patient not taking: Reported on 12/26/2023)     No current facility-administered medications on file prior to visit.   Allergies  Allergen Reactions   Codeine Itching   Social History   Socioeconomic History   Marital status: Widowed    Spouse  name: Not on file   Number of children: 3   Years of education: Not on file   Highest education level: Not on file  Occupational History   Not on file  Tobacco Use   Smoking status: Never   Smokeless tobacco: Never  Vaping Use   Vaping status: Never Used  Substance and Sexual Activity   Alcohol use: No   Drug use: No   Sexual activity: Not Currently  Other Topics Concern   Not on file  Social History Narrative   Husband died in 2011/01/30.   Social Drivers of Corporate investment banker Strain: Low Risk  (06/21/2022)   Overall Financial Resource Strain (CARDIA)    Difficulty of Paying Living  Expenses: Not very hard  Food Insecurity: No Food Insecurity (06/21/2022)   Hunger Vital Sign    Worried About Running Out of Food in the Last Year: Never true    Ran Out of Food in the Last Year: Never true  Transportation Needs: Unmet Transportation Needs (06/21/2022)   PRAPARE - Administrator, Civil Service (Medical): Yes    Lack of Transportation (Non-Medical): Yes  Physical Activity: Insufficiently Active (06/21/2022)   Exercise Vital Sign    Days of Exercise per Week: 3 days    Minutes of Exercise per Session: 30 min  Stress: Stress Concern Present (06/21/2022)   [employer] of Occupational Health - Occupational Stress Questionnaire    Feeling of Stress : To some extent  Social Connections: Moderately Integrated (06/21/2022)   Social Connection and Isolation Panel    Frequency of Communication with Friends and Family: More than three times a week    Frequency of Social Gatherings with Friends and Family: Three times a week    Attends Religious Services: More than 4 times per year    Active Member of Clubs or Organizations: Yes    Attends Banker Meetings: More than 4 times per year    Marital Status: Widowed  Intimate Partner Violence: Not At Risk (06/21/2022)   Humiliation, Afraid, Rape, and Kick questionnaire    Fear of Current or Ex-Partner: No    Emotionally Abused: No    Physically Abused: No    Sexually Abused: No      Review of Systems  All other systems reviewed and are negative.      Objective:   Physical Exam Vitals reviewed.  Constitutional:      General: She is not in acute distress.    Appearance: Normal appearance. She is not ill-appearing or toxic-appearing.  Cardiovascular:     Rate and Rhythm: Normal rate and regular rhythm. No extrasystoles are present.    Chest Wall: PMI is not displaced. No thrill.     Pulses: Normal pulses.     Heart sounds: Murmur heard.     Systolic murmur is present with a grade of 1/6.     No  friction rub.  Pulmonary:     Effort: Pulmonary effort is normal. No respiratory distress.     Breath sounds: No stridor. No wheezing, rhonchi or rales.  Abdominal:     General: Abdomen is flat. Bowel sounds are normal.     Palpations: Abdomen is soft.     Tenderness: There is no abdominal tenderness. There is no rebound.  Musculoskeletal:        General: Deformity present. No swelling.     Right lower leg: No edema.     Left lower leg: No edema.  Lymphadenopathy:     Cervical: No cervical adenopathy.  Skin:    Findings: No bruising, erythema, lesion or rash.  Neurological:     Mental Status: She is alert.           Assessment & Plan:  Osteopenia of multiple sites - Plan: DG Bone Density  Screening cholesterol level - Plan: CBC with Differential/Platelet, Lipid panel, Comprehensive metabolic panel with GFR  General medical exam Her blood pressure today is excellent.  Given her history of osteopenia I will repeat a bone density test to determine if we need to be more aggressive in treating possible osteoporosis.  Mammogram is up-to-date.  Colonoscopy is up-to-date.  Patient does not require Pap smear.  I recommended Prevnar 20, RSV, flu and COVID shot in the fall.  Regular anticipatory guidance is provided.  I will check CBC, CMP, fasting lipid panel.

## 2023-12-27 ENCOUNTER — Ambulatory Visit: Payer: Self-pay | Admitting: Family Medicine

## 2024-01-09 ENCOUNTER — Other Ambulatory Visit (HOSPITAL_COMMUNITY)

## 2024-01-11 ENCOUNTER — Telehealth: Payer: Self-pay

## 2024-01-11 ENCOUNTER — Other Ambulatory Visit: Payer: Self-pay

## 2024-01-11 MED ORDER — ROSUVASTATIN CALCIUM 10 MG PO TABS: 10.0000 mg | ORAL_TABLET | Freq: Every day | ORAL | 1 refills | Status: DC

## 2024-01-11 NOTE — Telephone Encounter (Signed)
 I received notification that pt had not read her results via MyChart. I informed pt of results and sent in 10mg  Crestor  as provider recommended.

## 2024-01-17 ENCOUNTER — Ambulatory Visit

## 2024-01-17 VITALS — Ht 61.0 in | Wt 136.0 lb

## 2024-01-17 DIAGNOSIS — Z0000 Encounter for general adult medical examination without abnormal findings: Secondary | ICD-10-CM

## 2024-01-17 NOTE — Progress Notes (Signed)
 Subjective:   Rebecca Osborne is a 73 y.o. who presents for a Medicare Wellness preventive visit.  As a reminder, Annual Wellness Visits don't include a physical exam, and some assessments may be limited, especially if this visit is performed virtually. We may recommend an in-person follow-up visit with your provider if needed.  Visit Complete: Virtual I connected with  Rebecca Osborne on 01/17/24 by a audio enabled telemedicine application and verified that I am speaking with the correct person using two identifiers.  Patient Location: Home  Provider Location: Home Office  I discussed the limitations of evaluation and management by telemedicine. The patient expressed understanding and agreed to proceed.  Vital Signs: Because this visit was a virtual/telehealth visit, some criteria may be missing or patient reported. Any vitals not documented were not able to be obtained and vitals that have been documented are patient reported.  VideoDeclined- This patient declined Librarian, academic. Therefore the visit was completed with audio only.  Persons Participating in Visit: Patient.  AWV Questionnaire: No: Patient Medicare AWV questionnaire was not completed prior to this visit.  Cardiac Risk Factors include: advanced age (>71men, >37 women);dyslipidemia     Objective:    Today's Vitals   01/17/24 1021  Weight: 136 lb (61.7 kg)  Height: 5' 1 (1.549 m)   Body mass index is 25.7 kg/m.     01/17/2024   11:07 AM 12/02/2022   10:15 PM 06/21/2022    3:17 PM 10/31/2021    3:54 PM 07/26/2021    4:21 PM 04/15/2021    3:03 PM 07/15/2020    2:31 PM  Advanced Directives  Does Patient Have a Medical Advance Directive? No No No No No No No  Would patient like information on creating a medical advance directive? Yes (MAU/Ambulatory/Procedural Areas - Information given)  No - Patient declined  No - Patient declined No - Patient declined No - Patient declined    Current  Medications (verified) Outpatient Encounter Medications as of 01/17/2024  Medication Sig   rosuvastatin  (CRESTOR ) 10 MG tablet Take 1 tablet (10 mg total) by mouth daily.   Cholecalciferol (VITAMIN D ) 50 MCG (2000 UT) tablet Take 2,000 Units by mouth daily. (Patient not taking: Reported on 01/17/2024)   famotidine  (PEPCID ) 20 MG tablet Take 1 tablet (20 mg total) by mouth daily. (Patient not taking: Reported on 01/17/2024)   fluticasone  (FLONASE ) 50 MCG/ACT nasal spray Place 2 sprays into both nostrils daily. (Patient not taking: Reported on 01/17/2024)   ibuprofen  (ADVIL ) 200 MG tablet Take 200-400 mg by mouth every 6 (six) hours as needed (for pain.). (Patient not taking: Reported on 01/17/2024)   No facility-administered encounter medications on file as of 01/17/2024.    Allergies (verified) Codeine   History: Past Medical History:  Diagnosis Date   Allergy    Cancer (HCC)    cervical cancer cells   GERD (gastroesophageal reflux disease)    Osteopenia    Past Surgical History:  Procedure Laterality Date   ABDOMINAL HYSTERECTOMY     BIOPSY  10/16/2019   Procedure: BIOPSY;  Surgeon: Shaaron Lamar HERO, MD;  [facility];  Service: Endoscopy;;   BOWEL RESECTION N/A 06/22/2016   Procedure: SMALL BOWEL RESECTION;  Surgeon: Selinda Artist Moats, MD;  Location: [facility];  Service: General;  Laterality: N/A;   CHOLECYSTECTOMY  2005   COLONOSCOPY  08/23/2019   Tortuous left colon, external and internal hemorrhoids   COLONOSCOPY N/A 08/23/2019   Procedure:  COLONOSCOPY;  Surgeon: Harvey Margo CROME, MD;  [facility];  Service: Endoscopy;  Laterality: N/A;  8:30   COLONOSCOPY WITH ESOPHAGOGASTRODUODENOSCOPY (EGD)  09/2009   Fields: Normal colonoscopy.  H. pylori gastritis.  Schatzki ring status post esophageal dilation.  Next colonoscopy 10 years   ESOPHAGOGASTRODUODENOSCOPY N/A 10/16/2019   Rourk: Erosive reflux esophagitis, gastric erosions, duodenal diverticulum.  Biopsies from the  stomach showed mild inflammation.  No H. pylori.   FRACTURE SURGERY     rt foot   LAPAROTOMY N/A 06/22/2016   Procedure: EXPLORATORY LAPAROTOMY;  Surgeon: Selinda Artist Moats, MD;  Location: [facility];  Service: General;  Laterality: N/A;   LYSIS OF ADHESION N/A 06/22/2016   Procedure: LYSIS OF ADHESION;  Surgeon: Selinda Artist Moats, MD;  Location: [facility];  Service: General;  Laterality: N/A;   MALONEY DILATION N/A 10/16/2019   Procedure: AGAPITO DILATION;  Surgeon: Shaaron Lamar HERO, MD;  [facility];  Service: Endoscopy;  Laterality: N/A;   Family History  Problem Relation Age of Onset   COPD Mother    Heart disease Mother    Miscarriages / [place] Mother    Vision loss Mother        from shingles   COPD Father    Cancer Brother        leukemia   Early death Brother    Colon cancer Neg Hx    Colon polyps Neg Hx    Inflammatory bowel disease Neg Hx    Social History   Socioeconomic History   Marital status: Widowed    Spouse name: Not on file   Number of children: 3   Years of education: Not on file   Highest education level: Not on file  Occupational History   Not on file  Tobacco Use   Smoking status: Never   Smokeless tobacco: Never  Vaping Use   Vaping status: Never Used  Substance and Sexual Activity   Alcohol use: No   Drug use: No   Sexual activity: Not Currently  Other Topics Concern   Not on file  Social History Narrative   Husband died in 02/17/2011.   Social Drivers of Corporate investment banker Strain: Low Risk  (01/17/2024)   Overall Financial Resource Strain (CARDIA)    Difficulty of Paying Living Expenses: Not very hard  Food Insecurity: No Food Insecurity (01/17/2024)   Hunger Vital Sign    Worried About Running Out of Food in the Last Year: Never true    Ran Out of Food in the Last Year: Never true  Transportation Needs: Unmet Transportation Needs (01/17/2024)   PRAPARE - Transportation    Lack of Transportation (Medical): Yes    Lack of  Transportation (Non-Medical): Yes  Physical Activity: Insufficiently Active (01/17/2024)   Exercise Vital Sign    Days of Exercise per Week: 3 days    Minutes of Exercise per Session: 30 min  Stress: No Stress Concern Present (01/17/2024)   [employer] of Occupational Health - Occupational Stress Questionnaire    Feeling of Stress: Only a little  Social Connections: Moderately Integrated (01/17/2024)   Social Connection and Isolation Panel    Frequency of Communication with Friends and Family: More than three times a week    Frequency of Social Gatherings with Friends and Family: Three times a week    Attends Religious Services: More than 4 times per year    Active Member of Clubs or Organizations: Yes    Attends  Club or Organization Meetings: More than 4 times per year    Marital Status: Widowed    Tobacco Counseling Counseling given: Not Answered    Clinical Intake:  Pre-visit preparation completed: Yes  Pain : No/denies pain  Diabetes: No   How often do you need to have someone help you when you read instructions, pamphlets, or other written materials from your doctor or pharmacy?: 1 - Never  Interpreter Needed?: No  Information entered by :: Charmaine Bloodgood LPN   Activities of Daily Living     01/17/2024   11:07 AM  In your present state of health, do you have any difficulty performing the following activities:  Hearing? 0  Vision? 0  Difficulty concentrating or making decisions? 0  Walking or climbing stairs? 0  Dressing or bathing? 0  Doing errands, shopping? 0  Preparing Food and eating ? N  Using the Toilet? N  In the past six months, have you accidently leaked urine? N  Do you have problems with loss of bowel control? N  Managing your Medications? N  Managing your Finances? N  Housekeeping or managing your Housekeeping? N    Patient Care Team: Duanne Butler DASEN, MD as PCP - General (Family Medicine) [facility]  I have updated  your Care Teams any recent Medical Services you may have received from other providers in the past year.     Assessment:   This is a routine wellness examination for Rebecca Osborne.  Hearing/Vision screen Hearing Screening - Comments:: Denies hearing difficulties   Vision Screening - Comments:: up to date with routine eye exams with [facility]   Goals Addressed             This Visit's Progress    Have 3 meals a day   On track    Would like to maintain good health, per pt.        Depression Screen     01/17/2024   11:06 AM 12/26/2023   11:08 AM 06/05/2023    4:19 PM 05/22/2023    3:32 PM 06/21/2022    3:16 PM 04/15/2021    3:00 PM 10/22/2018    3:05 PM  PHQ 2/9 Scores  PHQ - 2 Score 0 0 0 0 0 0 1  PHQ- 9 Score   0 0   2    Fall Risk     01/17/2024   11:07 AM 12/26/2023   11:08 AM 06/21/2022    3:20 PM 04/15/2021    3:06 PM 10/22/2018    3:05 PM  Fall Risk   Falls in the past year? 0 0 0 0 0   Number falls in past yr: 0 0 0 0   Injury with Fall? 0 0 0 0   Risk for fall due to : No Fall Risks No Fall Risks No Fall Risks No Fall Risks   Follow up Falls prevention discussed;Education provided;Falls evaluation completed Falls evaluation completed Falls evaluation completed;Education provided;Falls prevention discussed  Falls prevention discussed       Data saved with a previous flowsheet row definition    MEDICARE RISK AT HOME:  Medicare Risk at Home Any stairs in or around the home?: Yes If so, are there any without handrails?: No Home free of loose throw rugs in walkways, pet beds, electrical cords, etc?: Yes Adequate lighting in your home to reduce risk of falls?: Yes Life alert?: No Use of a cane, walker or w/c?: No Grab bars in the  bathroom?: Yes Shower chair or bench in shower?: No Elevated toilet seat or a handicapped toilet?: No  TIMED UP AND GO:  Was the test performed?  No  Cognitive Function: Declined/Normal: No cognitive concerns noted by patient or  family. Patient alert, oriented, able to answer questions appropriately and recall recent events. No signs of memory loss or confusion.        06/21/2022    3:18 PM 04/15/2021    3:09 PM  6CIT Screen  What Year? 0 points   What month? 0 points   What time? 0 points 0 points  Count back from 20 0 points 0 points  Months in reverse 0 points 2 points  Repeat phrase 0 points 0 points  Total Score 0 points     Immunizations Immunization History  Administered Date(s) Administered   Fluad Quad(high Dose 65+) 03/16/2019   Influenza, High Dose Seasonal PF 03/10/2018   Influenza-Unspecified 04/15/2021   Pneumococcal Polysaccharide-23 09/08/2016   Zoster Recombinant(Shingrix) 11/14/2022, 01/16/2023    Screening Tests Health Maintenance  Topic Date Due   COVID-19 Vaccine (1) Never done   Hepatitis C Screening  Never done   Pneumococcal Vaccine: 50+ Years (2 of 2 - PCV) 09/08/2017   INFLUENZA VACCINE  01/05/2024   Medicare Annual Wellness (AWV)  01/16/2025   MAMMOGRAM  07/30/2025   Colonoscopy  08/22/2029   DEXA SCAN  Completed   Zoster Vaccines- Shingrix  Completed   Hepatitis B Vaccines  Aged Out   HPV VACCINES  Aged Out   Meningococcal B Vaccine  Aged Out   DTaP/Tdap/Td  Discontinued    Health Maintenance  Health Maintenance Due  Topic Date Due   COVID-19 Vaccine (1) Never done   Hepatitis C Screening  Never done   Pneumococcal Vaccine: 50+ Years (2 of 2 - PCV) 09/08/2017   INFLUENZA VACCINE  01/05/2024   Health Maintenance Items Addressed: Patient had to cancel dexa due to car trouble; information provided to patient to reschedule   Additional Screening:  Vision Screening: Recommended annual ophthalmology exams for early detection of glaucoma and other disorders of the eye. Would you like a referral to an eye doctor? No    Dental Screening: Recommended annual dental exams for proper oral hygiene  Community Resource Referral / Chronic Care Management: CRR  required this visit?  No   CCM required this visit?  No   Plan:    I have personally reviewed and noted the following in the patient's chart:   Medical and social history Use of alcohol, tobacco or illicit drugs  Current medications and supplements including opioid prescriptions. Patient is not currently taking opioid prescriptions. Functional ability and status Nutritional status Physical activity Advanced directives List of other physicians Hospitalizations, surgeries, and ER visits in previous 12 months Vitals Screenings to include cognitive, depression, and falls Referrals and appointments  In addition, I have reviewed and discussed with patient certain preventive protocols, quality metrics, and best practice recommendations. A written personalized care plan for preventive services as well as general preventive health recommendations were provided to patient.   Lavelle Pfeiffer [place], [place]   1/86/7974   After Visit Summary: (MyChart) Due to this being a telephonic visit, the after visit summary with patients personalized plan was offered to patient via MyChart   Notes: Nothing significant to report at this time.

## 2024-01-17 NOTE — Patient Instructions (Signed)
 Rebecca Osborne , Thank you for taking time out of your busy schedule to complete your Annual Wellness Visit with me. I enjoyed our conversation and look forward to speaking with you again next year. I, as well as your care team,  appreciate your ongoing commitment to your health goals. Please review the following plan we discussed and let me know if I can assist you in the future. Your Game plan/ To Do List    Referrals: You have an order for:  []   2D Mammogram  []   3D Mammogram  [x]   Bone Density     Please call for appointment:   [facility] [address] [place], [place] [address] [phone]  Make sure to wear two-piece clothing.  No lotions, powders, or deodorants the day of the appointment. Make sure to bring picture ID and insurance card.  Bring list of medications you are currently taking including any supplements.   The number for Raynaldo Plough is [phone]  Follow up Visits: We will see or speak with you next year for your Next Medicare AWV with our clinical staff Have you seen your provider in the last 6 months (3 months if uncontrolled diabetes)? Yes  Clinician Recommendations:  Aim for 30 minutes of exercise or brisk walking, 6-8 glasses of water , and 5 servings of fruits and vegetables each day.       This is a list of the screenings recommended for you:  Health Maintenance  Topic Date Due   COVID-19 Vaccine (1) Never done   Hepatitis C Screening  Never done   Pneumococcal Vaccine for age over 2 (2 of 2 - PCV) 09/08/2017   Flu Shot  01/05/2024   Medicare Annual Wellness Visit  01/16/2025   Mammogram  07/30/2025   Colon Cancer Screening  08/22/2029   DEXA scan (bone density measurement)  Completed   Zoster (Shingles) Vaccine  Completed   Hepatitis B Vaccine  Aged Out   HPV Vaccine  Aged Out   Meningitis B Vaccine  Aged Out   DTaP/Tdap/Td vaccine  Discontinued    Advanced directives: (ACP Link)Information on  Advanced Care Planning can be found at [place]  Secretary of [facility] Advance Health Care Directives Advance Health Care Directives. [email or web address]   Advance Care Planning is important because it:  [x]  Makes sure you receive the medical care that is consistent with your values, goals, and preferences  [x]  It provides guidance to your family and loved ones and reduces their decisional burden about whether or not they are making the right decisions based on your wishes.  Follow the link provided in your after visit summary or read over the paperwork we have mailed to you to help you started getting your Advance Directives in place. If you need assistance in completing these, please reach out to us  so that we can help you!  See attachments for Preventive Care and Fall Prevention Tips.

## 2024-04-12 ENCOUNTER — Telehealth: Payer: Self-pay | Admitting: Family Medicine

## 2024-04-12 NOTE — Telephone Encounter (Signed)
 Copied from CRM #[id number]. Topic: Clinical - Medical Advice >> Apr 12, 2024  2:37 PM Larissa S wrote: Reason for CRM: Patient states her hair is thinning and wants to start taking viviscal. She wants to know if this will react with her other medications. Patient requesting a callback.  Callback # [phone]

## 2024-05-13 ENCOUNTER — Telehealth: Payer: Self-pay

## 2024-05-13 NOTE — Telephone Encounter (Signed)
 Copied from CRM #[id number]. Topic: Clinical - Medical Advice >> Apr 12, 2024  2:37 PM Larissa S wrote: Reason for CRM: Patient states her hair is thinning and wants to start taking viviscal. She wants to know if this will react with her other medications. Patient requesting a callback.  Callback # [phone] >> May 13, 2024  4:00 PM Antony RAMAN wrote: Calling again to know if she's able to take these without hurting her kidneys, please advise

## 2024-05-14 ENCOUNTER — Telehealth: Payer: Self-pay

## 2024-05-14 NOTE — Telephone Encounter (Signed)
 Copied from CRM #[id number]. Topic: Clinical - Medication Question >> May 13, 2024  4:32 PM Rebecca Osborne wrote: Reason for CRM: The patient has called to inquire about possible interaction with their current medications and vivical  The patient is interested in taking vivical for hair growth Please contact the patient further when possible

## 2024-06-06 ENCOUNTER — Encounter: Payer: Self-pay | Admitting: Gastroenterology

## 2024-07-02 ENCOUNTER — Other Ambulatory Visit: Payer: Self-pay | Admitting: Family Medicine

## 2024-12-26 ENCOUNTER — Encounter: Admitting: Family Medicine

## 2025-01-22 ENCOUNTER — Ambulatory Visit
